# Patient Record
Sex: Female | Born: 1950
Health system: Southern US, Community
[De-identification: ages and names within clinical notes are randomized; demographics above are authoritative.]

## PROBLEM LIST (undated history)

## (undated) DIAGNOSIS — R1031 Right lower quadrant pain: Secondary | ICD-10-CM

## (undated) DIAGNOSIS — R079 Chest pain, unspecified: Secondary | ICD-10-CM

## (undated) DIAGNOSIS — Z8719 Personal history of other diseases of the digestive system: Secondary | ICD-10-CM

## (undated) DIAGNOSIS — K769 Liver disease, unspecified: Secondary | ICD-10-CM

## (undated) DIAGNOSIS — E669 Obesity, unspecified: Secondary | ICD-10-CM

## (undated) DIAGNOSIS — K589 Irritable bowel syndrome without diarrhea: Secondary | ICD-10-CM

## (undated) DIAGNOSIS — E785 Hyperlipidemia, unspecified: Secondary | ICD-10-CM

## (undated) DIAGNOSIS — Q211 Atrial septal defect: Secondary | ICD-10-CM

## (undated) DIAGNOSIS — R5383 Other fatigue: Secondary | ICD-10-CM

## (undated) DIAGNOSIS — D126 Benign neoplasm of colon, unspecified: Secondary | ICD-10-CM

## (undated) DIAGNOSIS — E559 Vitamin D deficiency, unspecified: Secondary | ICD-10-CM

## (undated) DIAGNOSIS — R0602 Shortness of breath: Secondary | ICD-10-CM

## (undated) DIAGNOSIS — K5790 Diverticulosis of intestine, part unspecified, without perforation or abscess without bleeding: Secondary | ICD-10-CM

## (undated) DIAGNOSIS — M1990 Unspecified osteoarthritis, unspecified site: Secondary | ICD-10-CM

## (undated) DIAGNOSIS — Q2111 Secundum atrial septal defect: Secondary | ICD-10-CM

## (undated) DIAGNOSIS — D492 Neoplasm of unspecified behavior of bone, soft tissue, and skin: Secondary | ICD-10-CM

## (undated) DIAGNOSIS — E079 Disorder of thyroid, unspecified: Secondary | ICD-10-CM

## (undated) DIAGNOSIS — J019 Acute sinusitis, unspecified: Secondary | ICD-10-CM

## (undated) DIAGNOSIS — R5381 Other malaise: Secondary | ICD-10-CM

## (undated) DIAGNOSIS — F329 Major depressive disorder, single episode, unspecified: Secondary | ICD-10-CM

## (undated) DIAGNOSIS — I2699 Other pulmonary embolism without acute cor pulmonale: Secondary | ICD-10-CM

## (undated) DIAGNOSIS — F419 Anxiety disorder, unspecified: Secondary | ICD-10-CM

## (undated) DIAGNOSIS — N281 Cyst of kidney, acquired: Secondary | ICD-10-CM

## (undated) DIAGNOSIS — R002 Palpitations: Secondary | ICD-10-CM

## (undated) DIAGNOSIS — R42 Dizziness and giddiness: Secondary | ICD-10-CM

## (undated) DIAGNOSIS — H6990 Unspecified Eustachian tube disorder, unspecified ear: Secondary | ICD-10-CM

## (undated) DIAGNOSIS — I10 Essential (primary) hypertension: Secondary | ICD-10-CM

## (undated) DIAGNOSIS — I82409 Acute embolism and thrombosis of unspecified deep veins of unspecified lower extremity: Secondary | ICD-10-CM

## (undated) DIAGNOSIS — H6980 Other specified disorders of Eustachian tube, unspecified ear: Secondary | ICD-10-CM

## (undated) DIAGNOSIS — K5190 Ulcerative colitis, unspecified, without complications: Secondary | ICD-10-CM

## (undated) DIAGNOSIS — J329 Chronic sinusitis, unspecified: Secondary | ICD-10-CM

## (undated) DIAGNOSIS — H269 Unspecified cataract: Secondary | ICD-10-CM

## (undated) DIAGNOSIS — T7840XA Allergy, unspecified, initial encounter: Secondary | ICD-10-CM

## (undated) HISTORY — DX: Vitamin D deficiency, unspecified: E55.9

## (undated) HISTORY — DX: Obesity, unspecified: E66.9

## (undated) HISTORY — DX: Unspecified osteoarthritis, unspecified site: M19.90

## (undated) HISTORY — DX: Acute sinusitis, unspecified: J01.90

## (undated) HISTORY — DX: Other pulmonary embolism without acute cor pulmonale: I26.99

## (undated) HISTORY — DX: Right lower quadrant pain: R10.31

## (undated) HISTORY — PX: CHOLECYSTECTOMY: SHX55

## (undated) HISTORY — PX: UPPER GASTROINTESTINAL ENDOSCOPY: SHX188

## (undated) HISTORY — DX: Cyst of kidney, acquired: N28.1

## (undated) HISTORY — DX: Essential (primary) hypertension: I10

## (undated) HISTORY — DX: Major depressive disorder, single episode, unspecified: F32.9

## (undated) HISTORY — DX: Unspecified cataract: H26.9

## (undated) HISTORY — DX: Unspecified eustachian tube disorder, unspecified ear: H69.90

## (undated) HISTORY — DX: Other fatigue: R53.83

## (undated) HISTORY — DX: Neoplasm of unspecified behavior of bone, soft tissue, and skin: D49.2

## (undated) HISTORY — DX: Ulcerative colitis, unspecified, without complications: K51.90

## (undated) HISTORY — DX: Chest pain, unspecified: R07.9

## (undated) HISTORY — DX: Allergy, unspecified, initial encounter: T78.40XA

## (undated) HISTORY — DX: Palpitations: R00.2

## (undated) HISTORY — DX: Disorder of thyroid, unspecified: E07.9

## (undated) HISTORY — DX: Shortness of breath: R06.02

## (undated) HISTORY — PX: CERVICAL DISC SURGERY: SHX588

## (undated) HISTORY — DX: Hyperlipidemia, unspecified: E78.5

## (undated) HISTORY — DX: Personal history of other diseases of the digestive system: Z87.19

## (undated) HISTORY — DX: Chronic sinusitis, unspecified: J32.9

## (undated) HISTORY — DX: Atrial septal defect: Q21.1

## (undated) HISTORY — PX: HEMORRHOID SURGERY: SHX153

## (undated) HISTORY — DX: Benign neoplasm of colon, unspecified: D12.6

## (undated) HISTORY — DX: Secundum atrial septal defect: Q21.11

## (undated) HISTORY — PX: KNEE ARTHROTOMY: SUR107

## (undated) HISTORY — DX: Diverticulosis of intestine, part unspecified, without perforation or abscess without bleeding: K57.90

## (undated) HISTORY — PX: PARTIAL HYSTERECTOMY: SHX80

## (undated) HISTORY — PX: REDUCTION MAMMAPLASTY: SUR839

## (undated) HISTORY — DX: Acute embolism and thrombosis of unspecified deep veins of unspecified lower extremity: I82.409

## (undated) HISTORY — DX: Irritable bowel syndrome, unspecified: K58.9

## (undated) HISTORY — DX: Anxiety disorder, unspecified: F41.9

## (undated) HISTORY — PX: ABDOMINAL HYSTERECTOMY: SHX81

## (undated) HISTORY — PX: DILATION AND CURETTAGE OF UTERUS: SHX78

## (undated) HISTORY — PX: COLONOSCOPY: SHX174

## (undated) HISTORY — DX: Dizziness and giddiness: R42

## (undated) HISTORY — DX: Other specified disorders of Eustachian tube, unspecified ear: H69.80

## (undated) HISTORY — DX: Liver disease, unspecified: K76.9

## (undated) HISTORY — DX: Other malaise: R53.81

---

## 1970-10-30 HISTORY — PX: TONSILLECTOMY: SUR1361

## 1974-10-30 HISTORY — PX: LEFT OOPHORECTOMY: SHX1961

## 1988-10-30 HISTORY — PX: BREAST REDUCTION SURGERY: SHX8

## 1999-11-24 ENCOUNTER — Encounter: Payer: Self-pay | Admitting: Family Medicine

## 1999-11-24 ENCOUNTER — Encounter: Admission: RE | Admit: 1999-11-24 | Discharge: 1999-11-24 | Payer: Self-pay | Admitting: Family Medicine

## 2000-09-18 ENCOUNTER — Other Ambulatory Visit: Admission: RE | Admit: 2000-09-18 | Discharge: 2000-09-18 | Payer: Self-pay | Admitting: Family Medicine

## 2000-10-09 ENCOUNTER — Encounter: Admission: RE | Admit: 2000-10-09 | Discharge: 2000-10-09 | Payer: Self-pay | Admitting: Family Medicine

## 2000-10-09 ENCOUNTER — Encounter: Payer: Self-pay | Admitting: Family Medicine

## 2001-03-30 DIAGNOSIS — E785 Hyperlipidemia, unspecified: Secondary | ICD-10-CM

## 2001-04-18 ENCOUNTER — Encounter: Admission: RE | Admit: 2001-04-18 | Discharge: 2001-04-18 | Payer: Self-pay | Admitting: Family Medicine

## 2001-04-18 ENCOUNTER — Encounter: Payer: Self-pay | Admitting: Family Medicine

## 2003-12-14 ENCOUNTER — Encounter: Admission: RE | Admit: 2003-12-14 | Discharge: 2003-12-14 | Payer: Self-pay | Admitting: Family Medicine

## 2004-01-21 ENCOUNTER — Encounter: Admission: RE | Admit: 2004-01-21 | Discharge: 2004-01-21 | Payer: Self-pay | Admitting: Family Medicine

## 2004-09-01 ENCOUNTER — Ambulatory Visit: Payer: Self-pay | Admitting: Family Medicine

## 2004-09-29 ENCOUNTER — Encounter (INDEPENDENT_AMBULATORY_CARE_PROVIDER_SITE_OTHER): Payer: Self-pay | Admitting: Internal Medicine

## 2004-10-03 ENCOUNTER — Ambulatory Visit: Payer: Self-pay | Admitting: Internal Medicine

## 2004-10-19 ENCOUNTER — Ambulatory Visit: Payer: Self-pay | Admitting: Family Medicine

## 2004-10-27 ENCOUNTER — Ambulatory Visit: Payer: Self-pay | Admitting: Family Medicine

## 2004-10-27 ENCOUNTER — Other Ambulatory Visit: Admission: RE | Admit: 2004-10-27 | Discharge: 2004-11-04 | Payer: Self-pay | Admitting: Internal Medicine

## 2004-10-30 LAB — FECAL OCCULT BLOOD, GUAIAC: Fecal Occult Blood: NEGATIVE

## 2004-11-07 ENCOUNTER — Ambulatory Visit: Payer: Self-pay | Admitting: Family Medicine

## 2004-11-30 ENCOUNTER — Ambulatory Visit: Payer: Self-pay | Admitting: Family Medicine

## 2005-01-25 ENCOUNTER — Encounter: Admission: RE | Admit: 2005-01-25 | Discharge: 2005-01-25 | Payer: Self-pay | Admitting: Neurology

## 2005-01-28 DIAGNOSIS — Q211 Atrial septal defect: Secondary | ICD-10-CM

## 2005-02-03 ENCOUNTER — Ambulatory Visit: Payer: Self-pay

## 2005-02-15 ENCOUNTER — Encounter: Payer: Self-pay | Admitting: Cardiology

## 2005-02-15 ENCOUNTER — Ambulatory Visit (HOSPITAL_COMMUNITY): Admission: RE | Admit: 2005-02-15 | Discharge: 2005-02-15 | Payer: Self-pay | Admitting: Cardiology

## 2005-02-15 ENCOUNTER — Ambulatory Visit: Payer: Self-pay | Admitting: Cardiology

## 2005-02-20 ENCOUNTER — Ambulatory Visit: Payer: Self-pay

## 2005-03-29 ENCOUNTER — Ambulatory Visit: Payer: Self-pay | Admitting: Internal Medicine

## 2005-07-27 ENCOUNTER — Emergency Department (HOSPITAL_COMMUNITY): Admission: EM | Admit: 2005-07-27 | Discharge: 2005-07-27 | Payer: Self-pay | Admitting: Emergency Medicine

## 2005-08-03 ENCOUNTER — Ambulatory Visit: Payer: Self-pay | Admitting: Family Medicine

## 2005-10-18 ENCOUNTER — Ambulatory Visit: Payer: Self-pay | Admitting: Family Medicine

## 2005-12-22 ENCOUNTER — Ambulatory Visit: Payer: Self-pay | Admitting: Family Medicine

## 2006-02-20 ENCOUNTER — Emergency Department (HOSPITAL_COMMUNITY): Admission: EM | Admit: 2006-02-20 | Discharge: 2006-02-20 | Payer: Self-pay | Admitting: Emergency Medicine

## 2006-03-30 ENCOUNTER — Ambulatory Visit: Payer: Self-pay | Admitting: Internal Medicine

## 2006-05-25 ENCOUNTER — Ambulatory Visit: Payer: Self-pay | Admitting: Family Medicine

## 2006-07-10 ENCOUNTER — Ambulatory Visit: Payer: Self-pay | Admitting: Family Medicine

## 2006-08-14 ENCOUNTER — Ambulatory Visit: Payer: Self-pay | Admitting: Family Medicine

## 2006-12-26 ENCOUNTER — Ambulatory Visit: Payer: Self-pay | Admitting: Family Medicine

## 2007-01-04 ENCOUNTER — Ambulatory Visit: Payer: Self-pay | Admitting: Family Medicine

## 2007-01-16 ENCOUNTER — Ambulatory Visit: Payer: Self-pay | Admitting: Family Medicine

## 2007-01-16 LAB — CONVERTED CEMR LAB
Basophils Relative: 0.5 % (ref 0.0–1.0)
Eosinophils Relative: 2 % (ref 0.0–5.0)
HCT: 41.4 % (ref 36.0–46.0)
Hemoglobin: 14.4 g/dL (ref 12.0–15.0)
Lymphocytes Relative: 35 % (ref 12.0–46.0)
Monocytes Absolute: 0.5 10*3/uL (ref 0.2–0.7)
Neutro Abs: 4.1 10*3/uL (ref 1.4–7.7)
RDW: 12 % (ref 11.5–14.6)
WBC: 7.2 10*3/uL (ref 4.5–10.5)

## 2007-04-17 ENCOUNTER — Encounter: Admission: RE | Admit: 2007-04-17 | Discharge: 2007-04-17 | Payer: Self-pay | Admitting: Family Medicine

## 2007-07-12 ENCOUNTER — Encounter: Admission: RE | Admit: 2007-07-12 | Discharge: 2007-07-12 | Payer: Self-pay | Admitting: Internal Medicine

## 2007-09-01 ENCOUNTER — Emergency Department (HOSPITAL_COMMUNITY): Admission: EM | Admit: 2007-09-01 | Discharge: 2007-09-01 | Payer: Self-pay | Admitting: Family Medicine

## 2007-12-01 DIAGNOSIS — C44309 Unspecified malignant neoplasm of skin of other parts of face: Secondary | ICD-10-CM | POA: Insufficient documentation

## 2007-12-01 DIAGNOSIS — C44300 Unspecified malignant neoplasm of skin of unspecified part of face: Secondary | ICD-10-CM | POA: Insufficient documentation

## 2007-12-09 ENCOUNTER — Ambulatory Visit: Payer: Self-pay | Admitting: Family Medicine

## 2008-03-02 ENCOUNTER — Ambulatory Visit: Payer: Self-pay | Admitting: Family Medicine

## 2008-04-13 ENCOUNTER — Ambulatory Visit: Payer: Self-pay | Admitting: Family Medicine

## 2008-04-13 DIAGNOSIS — H6980 Other specified disorders of Eustachian tube, unspecified ear: Secondary | ICD-10-CM

## 2008-07-22 ENCOUNTER — Ambulatory Visit: Payer: Self-pay | Admitting: Internal Medicine

## 2008-07-22 ENCOUNTER — Observation Stay (HOSPITAL_COMMUNITY): Admission: EM | Admit: 2008-07-22 | Discharge: 2008-07-23 | Payer: Self-pay | Admitting: Emergency Medicine

## 2008-07-22 ENCOUNTER — Telehealth (INDEPENDENT_AMBULATORY_CARE_PROVIDER_SITE_OTHER): Payer: Self-pay | Admitting: Internal Medicine

## 2008-07-22 ENCOUNTER — Encounter (INDEPENDENT_AMBULATORY_CARE_PROVIDER_SITE_OTHER): Payer: Self-pay | Admitting: Internal Medicine

## 2008-08-05 ENCOUNTER — Encounter (INDEPENDENT_AMBULATORY_CARE_PROVIDER_SITE_OTHER): Payer: Self-pay | Admitting: Internal Medicine

## 2008-08-05 ENCOUNTER — Ambulatory Visit: Payer: Self-pay

## 2008-08-11 ENCOUNTER — Ambulatory Visit: Payer: Self-pay | Admitting: Family Medicine

## 2008-08-11 DIAGNOSIS — R42 Dizziness and giddiness: Secondary | ICD-10-CM

## 2008-08-11 DIAGNOSIS — E66811 Obesity, class 1: Secondary | ICD-10-CM | POA: Insufficient documentation

## 2008-08-11 DIAGNOSIS — E669 Obesity, unspecified: Secondary | ICD-10-CM

## 2008-08-13 ENCOUNTER — Encounter (INDEPENDENT_AMBULATORY_CARE_PROVIDER_SITE_OTHER): Payer: Self-pay | Admitting: Internal Medicine

## 2008-08-13 DIAGNOSIS — R002 Palpitations: Secondary | ICD-10-CM

## 2008-08-13 DIAGNOSIS — J329 Chronic sinusitis, unspecified: Secondary | ICD-10-CM | POA: Insufficient documentation

## 2008-08-25 ENCOUNTER — Other Ambulatory Visit: Admission: RE | Admit: 2008-08-25 | Discharge: 2008-08-25 | Payer: Self-pay | Admitting: Family Medicine

## 2008-08-25 ENCOUNTER — Encounter (INDEPENDENT_AMBULATORY_CARE_PROVIDER_SITE_OTHER): Payer: Self-pay | Admitting: Internal Medicine

## 2008-08-25 ENCOUNTER — Ambulatory Visit: Payer: Self-pay | Admitting: Family Medicine

## 2008-08-27 ENCOUNTER — Encounter (INDEPENDENT_AMBULATORY_CARE_PROVIDER_SITE_OTHER): Payer: Self-pay | Admitting: Internal Medicine

## 2008-09-02 ENCOUNTER — Encounter: Admission: RE | Admit: 2008-09-02 | Discharge: 2008-09-02 | Payer: Self-pay | Admitting: Family Medicine

## 2008-09-03 ENCOUNTER — Encounter (INDEPENDENT_AMBULATORY_CARE_PROVIDER_SITE_OTHER): Payer: Self-pay | Admitting: *Deleted

## 2008-09-03 ENCOUNTER — Ambulatory Visit: Payer: Self-pay | Admitting: Family Medicine

## 2008-09-10 ENCOUNTER — Encounter (INDEPENDENT_AMBULATORY_CARE_PROVIDER_SITE_OTHER): Payer: Self-pay | Admitting: *Deleted

## 2008-09-18 ENCOUNTER — Ambulatory Visit: Payer: Self-pay | Admitting: Family Medicine

## 2008-09-28 ENCOUNTER — Encounter (INDEPENDENT_AMBULATORY_CARE_PROVIDER_SITE_OTHER): Payer: Self-pay | Admitting: *Deleted

## 2008-09-28 LAB — CONVERTED CEMR LAB
OCCULT 2: NEGATIVE
OCCULT 3: NEGATIVE

## 2009-01-21 ENCOUNTER — Ambulatory Visit: Payer: Self-pay | Admitting: Family Medicine

## 2009-02-01 ENCOUNTER — Telehealth (INDEPENDENT_AMBULATORY_CARE_PROVIDER_SITE_OTHER): Payer: Self-pay | Admitting: Internal Medicine

## 2009-05-11 ENCOUNTER — Ambulatory Visit: Payer: Self-pay | Admitting: Family Medicine

## 2009-05-17 ENCOUNTER — Telehealth (INDEPENDENT_AMBULATORY_CARE_PROVIDER_SITE_OTHER): Payer: Self-pay | Admitting: Internal Medicine

## 2009-06-30 ENCOUNTER — Encounter (INDEPENDENT_AMBULATORY_CARE_PROVIDER_SITE_OTHER): Payer: Self-pay | Admitting: Internal Medicine

## 2009-09-17 ENCOUNTER — Telehealth (INDEPENDENT_AMBULATORY_CARE_PROVIDER_SITE_OTHER): Payer: Self-pay | Admitting: Internal Medicine

## 2009-09-20 ENCOUNTER — Ambulatory Visit: Payer: Self-pay | Admitting: Internal Medicine

## 2009-09-20 DIAGNOSIS — J0140 Acute pansinusitis, unspecified: Secondary | ICD-10-CM | POA: Insufficient documentation

## 2009-09-20 DIAGNOSIS — J019 Acute sinusitis, unspecified: Secondary | ICD-10-CM | POA: Insufficient documentation

## 2009-10-02 ENCOUNTER — Ambulatory Visit: Payer: Self-pay | Admitting: Family Medicine

## 2009-10-30 DIAGNOSIS — K769 Liver disease, unspecified: Secondary | ICD-10-CM

## 2009-10-30 HISTORY — DX: Liver disease, unspecified: K76.9

## 2009-12-20 ENCOUNTER — Ambulatory Visit: Payer: Self-pay | Admitting: Family Medicine

## 2009-12-20 DIAGNOSIS — R5381 Other malaise: Secondary | ICD-10-CM | POA: Insufficient documentation

## 2009-12-20 DIAGNOSIS — R1031 Right lower quadrant pain: Secondary | ICD-10-CM | POA: Insufficient documentation

## 2009-12-20 DIAGNOSIS — R5383 Other fatigue: Secondary | ICD-10-CM

## 2009-12-20 LAB — CONVERTED CEMR LAB
Albumin: 3.9 g/dL (ref 3.5–5.2)
Alkaline Phosphatase: 80 units/L (ref 39–117)
Basophils Relative: 0 % (ref 0.0–3.0)
Chloride: 109 meq/L (ref 96–112)
Creatinine, Ser: 0.8 mg/dL (ref 0.4–1.2)
Eosinophils Relative: 3 % (ref 0.0–5.0)
HCT: 41.9 % (ref 36.0–46.0)
Hemoglobin: 14 g/dL (ref 12.0–15.0)
Lymphs Abs: 2.2 10*3/uL (ref 0.7–4.0)
MCV: 94.1 fL (ref 78.0–100.0)
Monocytes Absolute: 0.5 10*3/uL (ref 0.1–1.0)
Neutro Abs: 3.9 10*3/uL (ref 1.4–7.7)
Platelets: 391 10*3/uL (ref 150.0–400.0)
Potassium: 3.6 meq/L (ref 3.5–5.1)
Sodium: 144 meq/L (ref 135–145)
TSH: 3.27 microintl units/mL (ref 0.35–5.50)
Total Protein: 6.7 g/dL (ref 6.0–8.3)
WBC: 6.8 10*3/uL (ref 4.5–10.5)

## 2009-12-22 ENCOUNTER — Encounter: Admission: RE | Admit: 2009-12-22 | Discharge: 2009-12-22 | Payer: Self-pay | Admitting: Family Medicine

## 2009-12-22 ENCOUNTER — Encounter: Payer: Self-pay | Admitting: Family Medicine

## 2009-12-22 DIAGNOSIS — N281 Cyst of kidney, acquired: Secondary | ICD-10-CM | POA: Insufficient documentation

## 2009-12-22 LAB — CONVERTED CEMR LAB: Vit D, 25-Hydroxy: 14 ng/mL — ABNORMAL LOW (ref 30–89)

## 2009-12-24 ENCOUNTER — Encounter: Payer: Self-pay | Admitting: Family Medicine

## 2009-12-24 ENCOUNTER — Telehealth: Payer: Self-pay | Admitting: Family Medicine

## 2009-12-24 ENCOUNTER — Ambulatory Visit: Payer: Self-pay | Admitting: Cardiology

## 2009-12-29 ENCOUNTER — Encounter (INDEPENDENT_AMBULATORY_CARE_PROVIDER_SITE_OTHER): Payer: Self-pay | Admitting: *Deleted

## 2009-12-30 ENCOUNTER — Telehealth: Payer: Self-pay | Admitting: Family Medicine

## 2009-12-30 ENCOUNTER — Ambulatory Visit: Payer: Self-pay | Admitting: Internal Medicine

## 2010-01-05 ENCOUNTER — Encounter (INDEPENDENT_AMBULATORY_CARE_PROVIDER_SITE_OTHER): Payer: Self-pay | Admitting: *Deleted

## 2010-01-13 ENCOUNTER — Ambulatory Visit: Payer: Self-pay | Admitting: Internal Medicine

## 2010-01-15 ENCOUNTER — Encounter: Payer: Self-pay | Admitting: Internal Medicine

## 2010-03-09 ENCOUNTER — Ambulatory Visit: Payer: Self-pay | Admitting: Family Medicine

## 2010-03-09 DIAGNOSIS — F3289 Other specified depressive episodes: Secondary | ICD-10-CM | POA: Insufficient documentation

## 2010-03-09 DIAGNOSIS — F329 Major depressive disorder, single episode, unspecified: Secondary | ICD-10-CM | POA: Insufficient documentation

## 2010-03-15 ENCOUNTER — Ambulatory Visit: Payer: Self-pay | Admitting: Family Medicine

## 2010-03-24 ENCOUNTER — Ambulatory Visit: Payer: Self-pay | Admitting: Family Medicine

## 2010-04-06 ENCOUNTER — Encounter: Payer: Self-pay | Admitting: Family Medicine

## 2010-04-06 ENCOUNTER — Telehealth: Payer: Self-pay | Admitting: Family Medicine

## 2010-04-08 ENCOUNTER — Ambulatory Visit: Payer: Self-pay | Admitting: Family Medicine

## 2010-04-08 DIAGNOSIS — E559 Vitamin D deficiency, unspecified: Secondary | ICD-10-CM | POA: Insufficient documentation

## 2010-04-11 LAB — CONVERTED CEMR LAB: Vit D, 25-Hydroxy: 23 ng/mL — ABNORMAL LOW (ref 30–89)

## 2010-06-02 ENCOUNTER — Encounter (INDEPENDENT_AMBULATORY_CARE_PROVIDER_SITE_OTHER): Payer: Self-pay | Admitting: *Deleted

## 2010-09-05 ENCOUNTER — Ambulatory Visit: Payer: Self-pay | Admitting: Family Medicine

## 2010-09-12 ENCOUNTER — Telehealth: Payer: Self-pay | Admitting: Family Medicine

## 2010-11-09 ENCOUNTER — Encounter: Payer: Self-pay | Admitting: Family Medicine

## 2010-11-09 ENCOUNTER — Ambulatory Visit
Admission: RE | Admit: 2010-11-09 | Discharge: 2010-11-09 | Payer: Self-pay | Source: Home / Self Care | Attending: Family Medicine | Admitting: Family Medicine

## 2010-11-09 DIAGNOSIS — R079 Chest pain, unspecified: Secondary | ICD-10-CM | POA: Insufficient documentation

## 2010-11-23 ENCOUNTER — Ambulatory Visit
Admission: RE | Admit: 2010-11-23 | Discharge: 2010-11-23 | Payer: Self-pay | Source: Home / Self Care | Attending: Cardiology | Admitting: Cardiology

## 2010-11-23 DIAGNOSIS — R0602 Shortness of breath: Secondary | ICD-10-CM | POA: Insufficient documentation

## 2010-11-24 ENCOUNTER — Ambulatory Visit
Admission: RE | Admit: 2010-11-24 | Discharge: 2010-11-24 | Payer: Self-pay | Source: Home / Self Care | Attending: Family Medicine | Admitting: Family Medicine

## 2010-11-24 DIAGNOSIS — G43909 Migraine, unspecified, not intractable, without status migrainosus: Secondary | ICD-10-CM | POA: Insufficient documentation

## 2010-11-28 ENCOUNTER — Ambulatory Visit
Admission: RE | Admit: 2010-11-28 | Discharge: 2010-11-28 | Payer: Self-pay | Source: Home / Self Care | Attending: Cardiology | Admitting: Cardiology

## 2010-11-28 ENCOUNTER — Ambulatory Visit (HOSPITAL_COMMUNITY)
Admission: RE | Admit: 2010-11-28 | Discharge: 2010-11-28 | Payer: Self-pay | Source: Home / Self Care | Attending: Cardiology | Admitting: Cardiology

## 2010-11-28 ENCOUNTER — Ambulatory Visit: Admission: RE | Admit: 2010-11-28 | Discharge: 2010-11-28 | Payer: Self-pay | Source: Home / Self Care

## 2010-11-28 ENCOUNTER — Other Ambulatory Visit: Payer: Self-pay | Admitting: Cardiology

## 2010-11-28 LAB — BASIC METABOLIC PANEL
BUN: 12 mg/dL (ref 6–23)
CO2: 26 mEq/L (ref 19–32)
Chloride: 105 mEq/L (ref 96–112)
Creatinine, Ser: 0.8 mg/dL (ref 0.4–1.2)
Glucose, Bld: 90 mg/dL (ref 70–99)

## 2010-11-28 LAB — HEPATIC FUNCTION PANEL
Alkaline Phosphatase: 78 U/L (ref 39–117)
Bilirubin, Direct: 0.1 mg/dL (ref 0.0–0.3)
Total Bilirubin: 0.7 mg/dL (ref 0.3–1.2)
Total Protein: 6.7 g/dL (ref 6.0–8.3)

## 2010-11-28 LAB — CBC WITH DIFFERENTIAL/PLATELET
Basophils Relative: 0.7 % (ref 0.0–3.0)
Eosinophils Absolute: 0.2 10*3/uL (ref 0.0–0.7)
Eosinophils Relative: 3 % (ref 0.0–5.0)
Hemoglobin: 14.3 g/dL (ref 12.0–15.0)
Lymphocytes Relative: 42.8 % (ref 12.0–46.0)
MCHC: 34.3 g/dL (ref 30.0–36.0)
Monocytes Relative: 5.8 % (ref 3.0–12.0)
Neutro Abs: 3.1 10*3/uL (ref 1.4–7.7)
RBC: 4.48 Mil/uL (ref 3.87–5.11)
WBC: 6.5 10*3/uL (ref 4.5–10.5)

## 2010-11-28 LAB — LDL CHOLESTEROL, DIRECT: Direct LDL: 181.8 mg/dL

## 2010-11-28 LAB — LIPID PANEL
Cholesterol: 254 mg/dL — ABNORMAL HIGH (ref 0–200)
Triglycerides: 236 mg/dL — ABNORMAL HIGH (ref 0.0–149.0)

## 2010-11-28 LAB — TSH: TSH: 2.78 u[IU]/mL (ref 0.35–5.50)

## 2010-11-29 NOTE — Assessment & Plan Note (Signed)
Summary: sinus infection/alc   Vital Signs:  Patient profile:   60 year old female Height:      63.50 inches Weight:      187 pounds BMI:     32.72 Temp:     98.2 degrees F oral Pulse rate:   68 / minute Pulse rhythm:   regular BP sitting:   150 / 80  (left arm) Cuff size:   large  Vitals Entered By: Linde Gillis CMA Duncan Dull) (September 05, 2010 9:44 AM) CC: ? sinus infection   History of Present Illness: 60 yo here for ? sinus infection.  Almost two weeks of worsening URI symptoms. Started with sneezing, runny nose, now with facial pressure. Teeth hurting, right ear was hurting last night. Subjective fevers, chills. No SOB. No CP. Cough is dry.  Taking Ibuprofen.  Current Medications (verified): 1)  Adult Aspirin Ec Low Strength 81 Mg  Tbec (Aspirin) .... Take 1 Tablet By Mouth Once A Day 2)  Advil 200 Mg  Caps (Ibuprofen) .... As Needed 3)  Topamax 50 Mg Tabs (Topiramate) .... Take One Tablet By Mouth Twice Daily 4)  Augmentin 875-125 Mg Tabs (Amoxicillin-Pot Clavulanate) .Marland Kitchen.. 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 ENT:  Complains of earache, nasal congestion, sinus pressure, and sore throat; denies ear discharge. CV:  Denies chest pain or discomfort. Resp:  Complains of cough; denies shortness of breath, sputum productive, and wheezing.  Physical Exam  General:  Well-developed,well-nourished,in no acute distress; alert,appropriate and cooperative throughout examination Afebrile,  nontoxic appearing.  Eyes:  No corneal or conjunctival inflammation noted. EOMI. Perrla. Funduscopic exam benign, without hemorrhages, exudates or papilledema. Vision grossly normal. Ears:  yellow fluid B TMS, no erythema Nose:  nasal discharge, mucosal pallor.   Frontal sinuses TTP Mouth:  MMM Lungs:  Normal respiratory effort, chest expands symmetrically. Lungs are clear to auscultation, no crackles or wheezes. Heart:  Normal rate and regular rhythm. S1 and S2 normal without gallop, murmur, click, rub or other extra sounds. Psych:  normally interactive and good eye contact.     Impression & Recommendations:  Problem # 1:  SINUSITIS - ACUTE-NOS (ICD-461.9) Assessment New Given duration and progression of symptoms, will treat for bacterial sinusitis with Augmentin. See pt instructions for details. Her updated medication list for this problem includes:    Augmentin 875-125 Mg Tabs (Amoxicillin-pot clavulanate) .Marland Kitchen... 1 by mouth 2 times daily x 10 days  Complete Medication List: 1)  Adult Aspirin Ec Low Strength 81 Mg Tbec (Aspirin) .... Take 1 tablet by mouth once a day 2)  Advil 200 Mg Caps (Ibuprofen) .... As needed 3)  Topamax 50 Mg Tabs (Topiramate) .... Take one tablet by mouth twice daily 4)  Augmentin 875-125  Mg Tabs (Amoxicillin-pot clavulanate) .Marland Kitchen.. 1 by mouth 2 times daily x 10 days  Patient Instructions: 1)  Take Augmentin as directed.  Drink lots of fluids.  Treat sympotmatically with Mucinex, nasal saline irrigation, and Tylenol/Ibuprofen. Also try claritin D or zyrtec D over the counter- two  times a day as needed ( have to sign for them at pharmacy). You can use warm compresses.  Cough suppressant at night. Call if not improving as expected in 5-7 days.  Prescriptions: AUGMENTIN 875-125 MG TABS (AMOXICILLIN-POT CLAVULANATE) 1 by mouth 2 times daily x 10 days  #20 x 0   Entered and Authorized by:   Ruthe Mannan MD   Signed by:   Ruthe Mannan MD on 09/05/2010   Method used:   [facility] [id number]       [address]       [place], [place]  [address]       Ph: [phone] or [phone]       Fax: [phone]   RxID:   [id number]    Orders Added: 1)  Est. Patient Level III [37628]    Current Allergies (reviewed today): No known allergies

## 2010-11-29 NOTE — Miscellaneous (Signed)
Summary: Orders Update  Clinical Lists Changes  Problems: Added new problem of RENAL CYST (ICD-593.2) Orders: Added new Referral order of Radiology Referral (Radiology) - Signed 

## 2010-11-29 NOTE — Letter (Signed)
Summary: [facility]  [facility]   Imported By: Lanelle Bal 02/24/2010 12:57:03  _____________________________________________________________________  External Attachment:    Type:   Image     Comment:   External Document

## 2010-11-29 NOTE — Letter (Signed)
Summary: Patient Notice- Polyp Results  [facility]  [address] [place], [place] [address]   Phone: [phone]  Fax: [phone]        January 15, 2010 MRN: [id number]    Nicole Daniels [address] [place], [place]  [address]    Dear Ms. Wixon,  I am pleased to inform you that the colon polyp(s) removed during your recent colonoscopy was (were) found to be benign (no cancer detected) upon pathologic examination.The polyp was adenomatous ( precancerous)  I recommend you have a repeat colonoscopy examination in _5 years to look for recurrent polyps, as having colon polyps increases your risk for having recurrent polyps or even colon cancer in the future.  Should you develop new or worsening symptoms of abdominal pain, bowel habit changes or bleeding from the rectum or bowels, please schedule an evaluation with either your primary care physician or with me.  Additional information/recommendations:  _x_ No further action with gastroenterology is needed at this time. Please      follow-up with your primary care physician for your other healthcare      needs.  __ Please call [phone] to schedule a return visit to review your      situation.  __ Please keep your follow-up visit as already scheduled.  __ Continue treatment plan as outlined the day of your exam.  Please call us if you are having persistent problems or have questions about your condition that have not been fully answered at this time.  Sincerely,  Hart Carwin MD  This letter has been electronically signed by your physician.  Appended Document: Patient Notice- Polyp Results Letter mailed 3.22.11

## 2010-11-29 NOTE — Procedures (Signed)
Summary: Colonoscopy  Patient: Makylah Bossard Note: All result statuses are Final unless otherwise noted.  Tests: (1) Colonoscopy (COL)   COL Colonoscopy           [facility]     [address]     [place], [place]  [address]           COLONOSCOPY PROCEDURE REPORT           PATIENT:  Sonyia, Muro  MR#:  [id number]     BIRTHDATE:  1951-02-06, 58 yrs. old  GENDER:  female           ENDOSCOPIST:  Hedwig Morton. Juanda Chance, MD     Referred by:  Ruthe Mannan, M.D.           PROCEDURE DATE:  01/13/2010     PROCEDURE:  Colonoscopy 57[address]     ASA CLASS:  Class I     INDICATIONS:  Routine Risk Screening           MEDICATIONS:   Versed 8 mg, Fentanyl 75 mcg           DESCRIPTION OF PROCEDURE:   After the risks benefits and     alternatives of the procedure were thoroughly explained, informed     consent was obtained.  Digital rectal exam was performed and     revealed no rectal masses.   The LB [id number] endoscope     was introduced through the anus and advanced to the cecum, which     was identified by both the appendix and ileocecal valve, without     limitations.  The quality of the prep was good, using MiraLax.     The instrument was then slowly withdrawn as the colon was fully     examined.     <<PROCEDUREIMAGES>>           FINDINGS:  A sessile polyp was found in the rectum. 8 mm polyp at     5 cm removed Polyp was snared without cautery. Retrieval was     successful (see image6). snare polyp  Mild diverticulosis was     found (see image5 and image4).  This was otherwise a normal     examination of the colon (see image1, image2, image3, and image7).     Retroflexed views in the rectum revealed no abnormalities.    The     scope was then withdrawn from the patient and the procedure     completed.           COMPLICATIONS:  None           ENDOSCOPIC IMPRESSION:     1) Sessile polyp in the rectum     2) Mild diverticulosis     3) Otherwise normal  examination     RECOMMENDATIONS:     1) Await pathology results           REPEAT EXAM:  In 5 - 7 year(s) for.  5 year recall if polyp     adenomatous           ______________________________     Hedwig Morton. Juanda Chance, MD           CC:           n.     eSIGNED:   Hedwig Morton. Josey Forcier at 01/13/2010 11:45 AM           Anise Salvo, [phone]  Note: An exclamation mark Marland Kitchen)  indicates a result that was not dispersed into the flowsheet. Document Creation Date: 01/13/2010 11:46 AM _______________________________________________________________________  (1) Order result status: Final Collection or observation date-time: 01/13/2010 11:40 Requested date-time:  Receipt date-time:  Reported date-time:  Referring Physician:   Ordering Physician: Lina Sar [id number]) Specimen Source:  Source: Launa Grill Order Number: [id number] Lab site:   Appended Document: Colonoscopy     Procedures Next Due Date:    Colonoscopy: 01/2015

## 2010-11-29 NOTE — Progress Notes (Signed)
Summary: not any better  Phone Note Call from Patient Call back at Home Phone [phone] Call back at [phone]   Caller: Patient Summary of Call: Pt was seen on 09/05/10 for a sinus infection and she is not any better.  Taking augmentin, says the sinus headaches are over powering- causing nausea and almost brings tears to her eyes.  She will finish the augmentin tomorrow or wednesday and is asking if something else can be called [facility]. Initial call taken by: Lowella Petties CMA, AAMA,  September 12, 2010 12:41 PM  Follow-up for Phone Call        ok to send in a zpack. no refills. Ruthe Mannan MD  September 12, 2010 2:36 PM  Advised pt, [facility] Follow-up by: Lowella Petties CMA, AAMA,  September 12, 2010 2:53 PM    New/Updated Medications: ZITHROMAX Z-PAK 250 MG TABS (AZITHROMYCIN) take by mouth as directed Prescriptions: ZITHROMAX Z-PAK 250 MG TABS (AZITHROMYCIN) take by mouth as directed  #1 pack x 0   Entered by:   Lowella Petties CMA, AAMA   Authorized by:   Ruthe Mannan MD   Signed by:   Lowella Petties CMA, AAMA on 09/12/2010   Method used:   [facility]        [facility] [id number]       [address]       [place], [place]  [address]       Ph: [phone] or [phone]       Fax: [phone]   RxID:   [id number]   Prior Medications: ADULT ASPIRIN EC LOW STRENGTH 81 MG  TBEC (ASPIRIN) Take 1 tablet by mouth once a day ADVIL 200 MG  CAPS (IBUPROFEN) as needed TOPAMAX 50 MG TABS (TOPIRAMATE) take one tablet by mouth twice daily AUGMENTIN 875-125 MG TABS (AMOXICILLIN-POT CLAVULANATE) 1 by mouth 2 times daily x 10 days Current Allergies: No known allergies

## 2010-11-29 NOTE — Progress Notes (Signed)
Summary: needs note stating that she can drive  Phone Note Call from Patient Call back at Work Phone [phone]   Caller: Patient Call For: Dr Dayton Martes Complaint: Cough/Sore throat Summary of Call: Pt was seen last month for a sinus infection and was told not to drive at work.  Now that she is better she needs a note releasing her to be able to drive.  Pt will pick up on friday. Initial call taken by: Lowella Petties CMA,  April 06, 2010 12:40 PM  Follow-up for Phone Call        On my desk. Ruthe Mannan MD  April 06, 2010 12:45 PM      Appended Document: needs note stating that she can drive Note giving patient permission to drive at work left at front desk.

## 2010-11-29 NOTE — Letter (Signed)
Summary: Out of Work  [facility]  [address] [place], [place] [address]   Phone: [phone]  Fax: [phone]    Mar 09, 2010   Employee:  ADREANNA FICKEL    To Whom It May Concern:   For Medical reasons, please excuse the above named employee from work for the following dates:  Start:   5/11/201  End:   03/14/2010  If you need additional information, please feel free to contact our office.         Sincerely,    Ruthe Mannan MD

## 2010-11-29 NOTE — Assessment & Plan Note (Signed)
Summary: EARS,HA,STIFF NECK,FATIGUE/CLE   Vital Signs:  Patient profile:   60 year old female Height:      63.5 inches Weight:      191.38 pounds BMI:     33.49 Temp:     98.7 degrees F oral Pulse rate:   80 / minute Pulse rhythm:   regular BP sitting:   132 / 78  (left arm) Cuff size:   regular  Vitals Entered By: Delilah Shan CMA Duncan Dull) (Mar 09, 2010 2:47 PM) CC: Ears, HA, stiff neck   History of Present Illness: 60 yo here with multiple compaints.  1.  Nasal congestion, sinus pressure, feeling feverish x 1 week. Dry cough.  No SOB or wheezing. Taking mucinex OTC, no relief of symptoms.  2.  Still fatigued, stopped taking her Vitamin D because she felt it is not helping. Does endorse signs of depression when asked- anhedonia, fatigue, insomina, tearfulness.  No SI or HI. No anxiety. Has been on going for about 7 months.  Current Medications (verified): 1)  Adult Aspirin Ec Low Strength 81 Mg  Tbec (Aspirin) .... Take 1 Tablet By Mouth Once A Day 2)  Advil 200 Mg  Caps (Ibuprofen) .... As Needed 3)  Amoxicillin 500 Mg Tabs (Amoxicillin) .Marland Kitchen.. 1 Tab By Mouth Two Times A Day X 10 Days 4)  Wellbutrin Sr 150 Mg Xr12h-Tab (Bupropion Hcl) .Marland Kitchen.. 1 Tab By Mouth Every Morning.  Allergies (verified): No Known Drug Allergies  Review of Systems      See HPI General:  Complains of chills, fatigue, and fever. Resp:  Denies cough, shortness of breath, sputum productive, and wheezing. GI:  Denies abdominal pain, nausea, vomiting, and vomiting blood. Psych:  Complains of depression; denies suicidal thoughts/plans, thoughts of violence, unusual visions or sounds, and thoughts /plans of harming others.  Physical Exam  General:  Well-developed,well-nourished,in no acute distress; alert,appropriate and cooperative throughout examination  Ears:  yellow fluid B TMS, no erythema Nose:  nasal discharge, mucosal pallor.   Frontal sinuses TTP Mouth:  MMM Lungs:  Normal respiratory effort,  chest expands symmetrically. Lungs are clear to auscultation, no crackles or wheezes. Heart:  Normal rate and regular rhythm. S1 and S2 normal without gallop, murmur, click, rub or other extra sounds. Psych:  normally interactive and good eye contact.     Impression & Recommendations:  Problem # 1:  SINUSITIS - ACUTE-NOS (ICD-461.9) Assessment New Given duration and progression of symptoms, will treat for bacterial sinusitis.  See pt instructions for details. Her updated medication list for this problem includes:    Amoxicillin 500 Mg Tabs (Amoxicillin) .Marland Kitchen... 1 tab by mouth two times a day x 10 days  Problem # 2:  DEPRESSION, MILD (ICD-311) Assessment: New Time spent with patient 25 minutes, more than 50% of this time was spent counseling patient on depression.  Start Wellbutrin 150 mg qam.  Advised returning next week to draw labs to rule out other sources of fatigue when she is not acutely ill, although most likely related to depressoin.  Her updated medication list for this problem includes:    Wellbutrin Sr 150 Mg Xr12h-tab (Bupropion hcl) .Marland Kitchen... 1 tab by mouth every morning.  Complete Medication List: 1)  Adult Aspirin Ec Low Strength 81 Mg Tbec (Aspirin) .... Take 1 tablet by mouth once a day 2)  Advil 200 Mg Caps (Ibuprofen) .... As needed 3)  Amoxicillin 500 Mg Tabs (Amoxicillin) .Marland Kitchen.. 1 tab by mouth two times a day x 10 days 4)  Wellbutrin Sr 150 Mg Xr12h-tab (Bupropion hcl) .Marland Kitchen.. 1 tab by mouth every morning.  Patient Instructions: 1)  Take amoxicillin as directed.  Drink lots of fluids.  Treat sympotmatically with Mucinex, nasal saline irrigation, and Tylenol/Ibuprofen. Also try claritin D or zyrtec D over the counter- two times a day as needed ( have to sign for them at pharmacy). You can use warm compresses. Call if not improving as expected in 5-7 days.  Prescriptions: WELLBUTRIN SR 150 MG XR12H-TAB (BUPROPION HCL) 1 tab by mouth every morning.  #30 x 0   Entered and  Authorized by:   Ruthe Mannan MD   Signed by:   Ruthe Mannan MD on 03/09/2010   Method used:   [facility]        [facility]       [address]       [place], [place]  [address]       Ph: [phone] or [phone]       Fax: [phone]   RxID:   [id number] AMOXICILLIN 500 MG TABS (AMOXICILLIN) 1 tab by mouth two times a day x 10 days  #20 x 0   Entered and Authorized by:   Ruthe Mannan MD   Signed by:   Ruthe Mannan MD on 03/09/2010   Method used:   [facility]        [facility] (retail)       [address]       [place], [place]  [address]       Ph: [phone] or [phone]       Fax: [phone]   RxID:   [id number]   Current Allergies (reviewed today): No known allergies

## 2010-11-29 NOTE — Letter (Signed)
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  If you need additional information, please feel free to contact our office.         Sincerely,    Ruthe Mannan MD

## 2010-11-29 NOTE — Assessment & Plan Note (Signed)
Summary: 2 WK F/U DLO   Vital Signs:  Patient profile:   60 year old female Height:      63.50 inches Weight:      190.38 pounds BMI:     33.32 Temp:     98.4 degrees F oral Pulse rate:   68 / minute Pulse rhythm:   regular BP sitting:   130 / 90  (left arm) Cuff size:   large  Vitals Entered By: Linde Gillis CMA Duncan Dull) (Mar 24, 2010 3:49 PM) CC: 2 week follow up   History of Present Illness: 60 yo here for follow up depression.   Depression- started Wellbutrn 150 mg daily on 5/11 for depression.  anhedonia, fatigue, insomina, tearfulness about the same.  No SI or HI. No anxiety. Has been on going for about 7 months, found out her husband has been unfaithful.  Tells me today about her strained relationship with her son since he married his wife 5 years ago.  His wife keeps her grandchildren away from her and it makes her very sad. She feels this is at the root of her depression.  Allergies (verified): No Known Drug Allergies  Review of Systems      See HPI Psych:  Denies sense of great danger, suicidal thoughts/plans, thoughts of violence, unusual visions or sounds, and thoughts /plans of harming others.  Physical Exam  General:  Well-developed,well-nourished,in no acute distress; alert,appropriate and cooperative throughout examination  Psych:  normally interactive and good eye contact.     Impression & Recommendations:  Problem # 1:  DEPRESSION, MILD (ICD-311) Assessment Unchanged Will increase Wellbutrin to 150 mg two times a day. Pt in agreement with plan. Will follow up again in one month.  Does not want to go to therapy but is ok talking with me about personal issues.  She wants to bring her husband to her next appt. Her updated medication list for this problem includes:    Wellbutrin Sr 150 Mg Xr12h-tab (Bupropion hcl) .Marland Kitchen... 1 tab by mouth every morning and 1 tab by mouth mid day.  Complete Medication List: 1)  Adult Aspirin Ec Low Strength 81 Mg Tbec  (Aspirin) .... Take 1 tablet by mouth once a day 2)  Advil 200 Mg Caps (Ibuprofen) .... As needed 3)  Wellbutrin Sr 150 Mg Xr12h-tab (Bupropion hcl) .Marland Kitchen.. 1 tab by mouth every morning and 1 tab by mouth mid day. 4)  Topamax 50 Mg Tabs (Topiramate) .... Take one tablet by mouth twice daily Prescriptions: WELLBUTRIN SR 150 MG XR12H-TAB (BUPROPION HCL) 1 tab by mouth every morning and 1 tab by mouth mid day.  #60 x 6   Entered and Authorized by:   Ruthe Mannan MD   Signed by:   Ruthe Mannan MD on 03/24/2010   Method used:   [facility]        [facility] [facility]ail)       [address]       [place], [place]  [address]       Ph: [phone] or [phone]       Fax: [phone]   RxID:   [id number]   Current Allergies (reviewed today): No known allergies

## 2010-11-29 NOTE — Letter (Signed)
Summary: Generic Letter  [facility]  [address] [place], [place] [address]   Phone: [phone]  Fax: [phone]    01/05/2010    Taiylor Collington [address] [place], [place]  [address]    Dear Ms. Brodt,   Vitamin  D level is very low, which may explain some of your fatigue. Please take 50,000 units of Vit D weekly, (prescription sent to your pharmacy) then 1000 units daily and recheck in 3 months.  Lab appointment scheduled:  June       Sincerely,   Lugene Fuquay CMA [facility])

## 2010-11-29 NOTE — Assessment & Plan Note (Signed)
Summary: CRAMPING AND EXTREME FATIGUE/DLO   Vital Signs:  Patient profile:   60 year old female Height:      63.5 inches Weight:      194 pounds BMI:     33.95 Temp:     98.4 degrees F oral Pulse rate:   92 / minute Pulse rhythm:   regular BP sitting:   114 / 80  (left arm) Cuff size:   regular  Vitals Entered By: Delilah Shan CMA Duncan Dull) (December 20, 2009 8:54 AM) CC: Cramping and extreme fatigue   History of Present Illness: 61 yo female new to me here with complaint of abdominal cramping and fatigue.  Abdominal cramping- feels like mestrual cramps, right>left.  No vaginal bleeding. G4P2 s/p  partial hysterectomy for endometriosis 20 years ago.  No fevers, n/v/d.  No yellowing of skin.  Never had anything like this before.  She does have at least one ovary (she thinks right).  No weight loss, fevers or chills but she is having extreme fatigue that started about same time.  Fatigue- started a month ago, accompanied by the abdominal cramping.   Feels like she cannot get enough sleep.  Sometimes short of breath with exertion.  No CP, LE edema, changes in her bowels, blood in stool.  Never had a colonscopy.  Current Medications (verified): 1)  Adult Aspirin Ec Low Strength 81 Mg  Tbec (Aspirin) .... Take 1 Tablet By Mouth Once A Day 2)  Advil 200 Mg  Caps (Ibuprofen) .... As Needed  Allergies (verified): No Known Drug Allergies  Review of Systems      See HPI General:  Complains of fatigue; denies chills, fever, loss of appetite, sweats, and weakness. Eyes:  Denies blurring. ENT:  Denies difficulty swallowing. CV:  Denies chest pain or discomfort. Resp:  Complains of shortness of breath; denies sputum productive and wheezing. GI:  Denies abdominal pain, dark tarry stools, diarrhea, nausea, and vomiting. MS:  Denies joint pain, joint redness, joint swelling, and loss of strength. Derm:  Denies rash. Neuro:  Denies brief paralysis, difficulty with concentration,  disturbances in coordination, falling down, and headaches. Psych:  Denies anxiety and depression. Endo:  Denies cold intolerance, heat intolerance, and weight change. Heme:  Denies abnormal bruising, bleeding, enlarge lymph nodes, fevers, pallor, and skin discoloration.  Physical Exam  General:  Well-developed,well-nourished,in no acute distress; alert,appropriate and cooperative throughout examination Weight stable. Eyes:  No corneal or conjunctival inflammation noted. EOMI. Perrla. Funduscopic exam benign, without hemorrhages, exudates or papilledema. Vision grossly normal. Mouth:  MMM Neck:  No deformities, masses, or tenderness noted. Lungs:  Normal respiratory effort, chest expands symmetrically. Lungs are clear to auscultation, no crackles or wheezes. Heart:  Normal rate and regular rhythm. S1 and S2 normal without gallop, murmur, click, rub or other extra sounds. Abdomen:  soft.   mildly TTP througout. Pos BS, no rebound, no guarding. Extremities:  no edema either lower legs Neurologic:  alert & oriented X3, cranial nerves II-XII intact, strength normal in all extremities, sensation intact to light touch, gait normal, DTRs symmetrical and normal, finger-to-nose normal, and Romberg negative--no palmer drift  able to heel-toe-tandum walk without difficulty Psych:  normally interactive and good eye contact.     Impression & Recommendations:  Problem # 1:  RLQ PAIN (ICD-789.03) Assessment New Given duration of symptoms unlikely appendicitis.  Perhaps diverticulosis. Will get RLQ pelvic and abdominal ultrasound to rule out malignancy, also check CA 125 given associated fatigue. Needs colonoscopy, will set up  today. Orders: Radiology Referral (Radiology) T- * Misc. Laboratory test [phone]) Specimen Handling (60454)  Problem # 2:  FATIGUE (ICD-780.79) Assessment: New May be related to #1. Given DOE, concern for anemia, check a CBC, B12/Folate, TSH, LFTs, BMET, VIt D. If labs  unremarkable along with above imaging will need GI referral if symptoms persist. Orders: Venipuncture (09811) TLB-B12 + Folate Pnl ([id number]-Z30/QMV) TLB-CBC Platelet - w/Differential (85025-CBCD) TLB-TSH (Thyroid Stimulating Hormone) (84443-TSH) TLB-Hepatic/Liver Function Pnl (80076-HEPATIC) TLB-BMP (Basic Metabolic Panel-BMET) (80048-METABOL) T-Vitamin D (25-Hydroxy) (78469-62952)  Complete Medication List: 1)  Adult Aspirin Ec Low Strength 81 Mg Tbec (Aspirin) .... Take 1 tablet by mouth once a day 2)  Advil 200 Mg Caps (Ibuprofen) .... As needed  Patient Instructions: 1)  Good to meet you, Ms. Vanderzanden. 2)  Please stop by to see Shirlee Limerick on your way out to set up your ultrasound and colonoscopy. 3)  I will call you in a couple of days with your lab results.  Current Allergies (reviewed today): No known allergies

## 2010-11-29 NOTE — Letter (Signed)
Summary: Nicole Daniels letter  [place] [facility]  [address] [place], [place] [address]   Phone: [phone]  Fax: [phone]       06/02/2010 MRN: [id number]  Nicole Daniels [address] [place], [place]  [address]  Dear Ms. [facility] [place] and [place] announce the retirement of Arta Silence, M.D., from full-time practice at the [facility] office effective April 28, 2010 and his plans of returning part-time.  It is important to Dr. Hetty Ely and to our practice that you understand that [facility] has seven physicians in our office for your health care needs.  We will continue to offer the same exceptional care that you have today.    Dr. Hetty Ely has spoken to many of you about his plans for retirement and returning part-time in the fall.   We will continue to work with you through the transition to schedule appointments for you in the office and meet the high standards that [facility] is committed to.   Again, it is with great pleasure that we share the news that Dr. Hetty Ely will return to [facility] in October of 2011 with a reduced schedule.    If you have any questions, or would like to request an appointment with one of our physicians, please call us at [phone] and press the option for Scheduling an appointment.  We take pleasure in providing you with excellent patient care and look forward to seeing you at your next office visit.  Our [facility] Physicians are:  Tillman Abide, M.D. Laurita Quint, M.D. Roxy Manns, M.D. Kerby Nora, M.D. Jayvian Escoe Beat, M.D. Ruthe Mannan, M.D. We proudly welcomed Raechel Ache, M.D. and Eustaquio Boyden, M.D. to the practice in July/August 2011.  Sincerely,  [facility]

## 2010-11-29 NOTE — Progress Notes (Signed)
Summary: ct scan   Phone Note Call from Patient Call back at Home Phone [phone]   Caller: Patient Call For: Shaune Leeks MD Summary of Call: Patient is having CT scan done this afternoon and wanted to let us know not to talk to anyone else about the results.  Initial call taken by: Melody Comas,  December 24, 2009 1:48 PM  Follow-up for Phone Call        Noted. Follow-up by: Shaune Leeks MD,  December 24, 2009 6:01 PM

## 2010-11-29 NOTE — Progress Notes (Signed)
Summary: pt is asking for CT results  Phone Note Call from Patient Call back at Home Phone [phone] Call back at [phone]   Caller: Patient Summary of Call: Please review CT report.  Pt is asking for results. Initial call taken by: Lowella Petties CMA,  December 30, 2009 2:31 PM  Follow-up for Phone Call        Please let pt know following results: No acute process in Abdomen or pelvis. 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 Cyst in L kidney, typically not a problem. Might be followed but has nothing todo with her symptomatology. Follow-up by: Shaune Leeks MD,  December 30, 2009 4:53 PM  Additional Follow-up for Phone Call Additional follow up Details #1::        Advised pt, she will let us know if she doesnt get better. Additional Follow-up by: Lowella Petties CMA,  December 30, 2009 4:58 PM

## 2010-11-29 NOTE — Miscellaneous (Signed)
Summary: [facility] PREVISIT/PREP  Clinical Lists Changes  Medications: Added new medication of DULCOLAX 5 MG  TBEC (BISACODYL) Day before procedure take 2 at 3pm and 2 at 8pm. - Signed Added new medication of METOCLOPRAMIDE HCL 10 MG  TABS (METOCLOPRAMIDE HCL) As per prep instructions. - Signed Added new medication of MIRALAX   POWD (POLYETHYLENE GLYCOL 3350) As per prep  instructions. - Signed Rx of DULCOLAX 5 MG  TBEC (BISACODYL) Day before procedure take 2 at 3pm and 2 at 8pm.;  #4 x 0;  Signed;  Entered by: Wyona Almas RN;  Authorized by: Hart Carwin MD;  Method used: [facility]  [place] [place], [place]  [address], Ph: [phone] or [phone], Fax: [phone] Rx of METOCLOPRAMIDE HCL 10 MG  TABS (METOCLOPRAMIDE HCL) As per prep instructions.;  #2 x 0;  Signed;  Entered by: Wyona Almas RN;  Authorized by: Hart Carwin MD;  Method used: [facility]ically [facility]  [place], [place], [place]  [address], Ph: [phone] or [phone], Fax: [phone] Rx of MIRALAX   POWD (POLYETHYLENE GLYCOL 3350) As per prep  instructions.;  #255gm x 0;  Signed;  Entered by: Wyona Almas RN;  Authorized by: Hart Carwin MD;  Method used: [facility]ically [facility]  [place] [place], [place]  [address], Ph: [phone] or [phone], Fax: [phone] Observations: Added new observation of NKA: T (12/30/2009 15:06)    Prescriptions: MIRALAX   POWD (POLYETHYLENE GLYCOL 3350) As per prep  instructions.  #255gm x 0   Entered by:   Wyona Almas RN   Authorized by:   Hart Carwin MD   Signed by:   Wyona Almas RN on 12/30/2009   Method used:   [facility]ronically [facility]        [facility]  [place] (retail)       [address]       [place], [place]  [address]       Ph: [phone] or [phone]       Fax: [phone]   RxID:   [id number] METOCLOPRAMIDE HCL 10 MG  TABS (METOCLOPRAMIDE HCL)  As per prep instructions.  #2 x 0   Entered by:   Wyona Almas RN   Authorized by:   Hart Carwin MD   Signed by:   Wyona Almas RN on 12/30/2009   Method used:   [facility]ron[facility]        [facility]  [place] (retail)       [address]       [place], [place]  [address]       Ph: [phone] or [phone]       Fax: [phone]   RxID:   [id number] DULCOLAX 5 MG  TBEC (BISACODYL) Day before procedure take 2 at 3pm and 2 at 8pm.  #4 x 0   Entered by:   Wyona Almas RN   Authorized by:   Hart Carwin MD   Signed by:   Wyona Almas RN on 12/30/2009   Method used:   [facility]        [facility]  [place] (retail)       [address]       [place], [place]  [address]       Ph: [phone] or [phone]       Fax: [phone]   RxID:   [id number]

## 2010-11-29 NOTE — Letter (Signed)
Summary: Out of Work  [facility] [employer] [facility] at [facility]  [address] [place], [place] [address]   Phone: [phone]  Fax: [phone]    April 06, 2010   Employee:  CAMRI MOLLOY    To Whom It May Concern:  Patient is cleared to drive at work.  No restrictions.  If you need additional information, please feel free to contact our office.         Sincerely,    Ruthe Mannan MD

## 2010-11-29 NOTE — Assessment & Plan Note (Signed)
Summary: 1 WEEK FOLLOW UP   Vital Signs:  Patient profile:   60 year old female Height:      63.5 inches Weight:      192 pounds BMI:     33.60 Temp:     98.2 degrees F oral Pulse rate:   68 / minute Pulse rhythm:   regular BP sitting:   140 / 100  (left arm) Cuff size:   regular  Vitals Entered By: Linde Gillis CMA Duncan Dull) (Mar 15, 2010 12:02 PM) CC: 1 week follow up   History of Present Illness: 60 yo here with no improvement of sinus pressure.  1.  Nasal congestion, sinus pressure, feeling feverish x 2 week. Dry cough.  No SOB or wheezing. Taking mucinex OTC, no relief of symptoms. Amoxicillin not helping.  Depression- started Wellbutrn 150 mg daily on 5/11 for depression.  anhedonia, fatigue, insomina, tearfulness about the same.  No SI or HI. No anxiety. Has been on going for about 7 months, found out her husband has been unfaithful.  Current Medications (verified): 1)  Adult Aspirin Ec Low Strength 81 Mg  Tbec (Aspirin) .... Take 1 Tablet By Mouth Once A Day 2)  Advil 200 Mg  Caps (Ibuprofen) .... As Needed 3)  Wellbutrin Sr 150 Mg Xr12h-Tab (Bupropion Hcl) .Marland Kitchen.. 1 Tab By Mouth Every Morning. 4)  Topamax 50 Mg Tabs (Topiramate) .... Take One Tablet By Mouth Twice Daily 5)  Augmentin 875-125 Mg Tabs (Amoxicillin-Pot Clavulanate) .Marland Kitchen.. 1 By Mouth 2 Times Daily X 10 Days 6)  Diflucan 150 Mg Tabs (Fluconazole) .... Once Daily  Allergies (verified): No Known Drug Allergies  Review of Systems      See HPI General:  Complains of chills and fever. ENT:  Complains of nasal congestion, postnasal drainage, and sinus pressure. Resp:  Complains of cough; denies shortness of breath, sputum productive, and wheezing. Psych:  Complains of depression; denies anxiety, suicidal thoughts/plans, thoughts of violence, unusual visions or sounds, and thoughts /plans of harming others.  Physical Exam  General:  Well-developed,well-nourished,in no acute distress; alert,appropriate and  cooperative throughout examination  Ears:  yellow fluid B TMS, no erythema Nose:  nasal discharge, mucosal pallor.   Frontal sinuses TTP Mouth:  MMM Lungs:  Normal respiratory effort, chest expands symmetrically. Lungs are clear to auscultation, no crackles or wheezes. Heart:  Normal rate and regular rhythm. S1 and S2 normal without gallop, murmur, click, rub or other extra sounds. Psych:  normally interactive and good eye contact.     Impression & Recommendations:  Problem # 1:  DEPRESSION, MILD (ICD-311) Assessment Unchanged Discussed that we really need more time to see if Wellbutirn is working.  Follow up with me in 2 weeks.  Discussed psychotherapy, she is not interested at this time. Her updated medication list for this problem includes:    Wellbutrin Sr 150 Mg Xr12h-tab (Bupropion hcl) .Marland Kitchen... 1 tab by mouth every morning.  Problem # 2:  SINUSITIS - ACUTE-NOS (ICD-461.9) Assessment: Unchanged Stop amoxicillin, start Augmentin.  Follow up in 2 weeks. The following medications were removed from the medication list:    Amoxicillin 500 Mg Tabs (Amoxicillin) .Marland Kitchen... 1 tab by mouth two times a day x 10 days Her updated medication list for this problem includes:    Augmentin 875-125 Mg Tabs (Amoxicillin-pot clavulanate) .Marland Kitchen... 1 by mouth 2 times daily x 10 days  Complete Medication List: 1)  Adult Aspirin Ec Low Strength 81 Mg Tbec (Aspirin) .... Take 1 tablet by mouth  once a day 2)  Advil 200 Mg Caps (Ibuprofen) .... As needed 3)  Wellbutrin Sr 150 Mg Xr12h-tab (Bupropion hcl) .Marland Kitchen.. 1 tab by mouth every morning. 4)  Topamax 50 Mg Tabs (Topiramate) .... Take one tablet by mouth twice daily 5)  Augmentin 875-125 Mg Tabs (Amoxicillin-pot clavulanate) .Marland Kitchen.. 1 by mouth 2 times daily x 10 days 6)  Diflucan 150 Mg Tabs (Fluconazole) .... Once daily Prescriptions: DIFLUCAN 150 MG TABS (FLUCONAZOLE) once daily  #1 x 0   Entered and Authorized by:   Ruthe Mannan MD   Signed by:   Ruthe Mannan MD on  03/15/2010   Method used:   [facility]        [facility]  [place] (retail)       [address]       [place], [place]  [address]       Ph: [phone] or [phone]       Fax: [phone]   RxID:   [id number] AUGMENTIN 875-125 MG TABS (AMOXICILLIN-POT CLAVULANATE) 1 by mouth 2 times daily x 10 days  #20 x 0   Entered and Authorized by:   Ruthe Mannan MD   Signed by:   Ruthe Mannan MD on 03/15/2010   Method used:   [facility]        [facility]  [place] (retail)       [address]       [place], [place]  [address]       Ph: [phone] or [phone]       Fax: [phone]   RxID:   [id number]   Current Allergies (reviewed today): No known allergies

## 2010-11-29 NOTE — Letter (Signed)
Summary: Out of Work  [facility] [employer] [facility] at [facility]  [address] [place], [place] [address]   Phone: [phone]  Fax: [phone]    September 05, 2010   Employee:  Nicole Daniels    To Whom It May Concern:   For Medical reasons, please excuse the above named employee from work for the following dates:  Start:   09/05/2010  End:   09/07/2010  If you need additional information, please feel free to contact our office.         Sincerely,    Ruthe Mannan MD

## 2010-11-29 NOTE — Letter (Signed)
Summary: [facility] Instructions  [facility]  [address] [place], [place] [address]   Phone: [phone]  Fax: [phone]       Nicole Daniels    06/28/1951    MRN: [id number]       Procedure Day Dorna Bloom:  Lenor Coffin  01/13/10     Arrival Time: 10:00AM     Procedure Time:  11:00AM     Location of Procedure:                    _X _  [facility] (4th Floor)   PREPARATION FOR COLONOSCOPY WITH MIRALAX  Starting 5 days prior to your procedure 01/08/10 do not eat nuts, seeds, popcorn, corn, beans, peas,  salads, or any raw vegetables.  Do not take any fiber supplements (e.g. Metamucil, Citrucel, and Benefiber). ____________________________________________________________________________________________________   THE DAY BEFORE YOUR PROCEDURE         DATE: 01/12/10  DAY: WEDNESDAY  1   Drink clear liquids the entire day-NO SOLID FOOD  2   Do not drink anything colored red or purple.  Avoid juices with pulp.  No orange juice.  3   Drink at least 64 oz. (8 glasses) of fluid/clear liquids during the day to prevent dehydration and help the prep work efficiently.  CLEAR LIQUIDS INCLUDE: Water Jello Ice Popsicles Tea (sugar ok, no milk/cream) Powdered fruit flavored drinks Coffee (sugar ok, no milk/cream) Gatorade Juice: apple, white grape, white cranberry  Lemonade Clear bullion, consomm, broth Carbonated beverages (any kind) Strained chicken noodle soup Hard Candy  4   Mix the entire bottle of Miralax with 64 oz. of Gatorade/Powerade in the morning and put in the refrigerator to chill.  5   At 3:00 pm take 2 Dulcolax/Bisacodyl tablets.  6   At 4:30 pm take one Reglan/Metoclopramide tablet.  7  Starting at 5:00 pm drink one 8 oz glass of the Miralax mixture every 15-20 minutes until you have finished drinking the entire 64 oz.  You should finish drinking prep around 7:30 or 8:00 pm.  8   If you are nauseated, you may take the 2nd  Reglan/Metoclopramide tablet at 6:30 pm.        9    At 8:00 pm take 2 more DULCOLAX/Bisacodyl tablets.     THE DAY OF YOUR PROCEDURE      DATE:  01/13/10  DAY: Lenor Coffin  You may drink clear liquids until 9:00AM  (2 HOURS BEFORE PROCEDURE).   MEDICATION INSTRUCTIONS  Unless otherwise instructed, you should take regular prescription medications with a small sip of water as early as possible the morning of your procedure.        OTHER INSTRUCTIONS  You will need a responsible adult at least 61 years of age to accompany you and drive you home.   This person must remain in the waiting room during your procedure.  Wear loose fitting clothing that is easily removed.  Leave jewelry and other valuables at home.  However, you may wish to bring a book to read or an iPod/MP3 player to listen to music as you wait for your procedure to start.  Remove all body piercing jewelry and leave at home.  Total time from sign-in until discharge is approximately 2-3 hours.  You should go home directly after your procedure and rest.  You can resume normal activities the day after your procedure.  The day of your procedure you should not:   Drive   Make  legal decisions   Operate machinery   Drink alcohol   Return to work  You will receive specific instructions about eating, activities and medications before you leave.   The above instructions have been reviewed and explained to me by   Wyona Almas RN  December 30, 2009 3:32 PM     I fully understand and can verbalize these instructions _____________________________ Date _______

## 2010-12-01 NOTE — Assessment & Plan Note (Signed)
Summary: np6/dx:chest pain/lg      Allergies Added: NKDA  Visit Type:  NP Primary Provider:  Dr. Ruthe Mannan  CC:  palpitations, chest pressure, SOB, and A-fib.  History of Present Illness: [name] presents for evaluation of dyspnea and chest tightness.  Paitent has been getting short of breath after walking up 1 flight of steps since 11/11.  She is also short of breath walking up inclines but does ok on flat ground.  She additionally has been getting episodes of chest tightness 1-2 times a week.  This is often associated with walking.  It can last for up to 5 minutes and resolves with rest.  It is not associated in particular with the exertional dyspnea.  This has also been a stable pattern since 11/11.  She had a negative myoview in 2009.  She does not remember the indication for the test.    ECG: NSR, normal  Labs (2/11): TSH normal, K 3.6, creatinine 0.8  Preventive Screening-Counseling & Management  Alcohol-Tobacco     Smoking Status: never  Current Medications (verified): 1)  Adult Aspirin Ec Low Strength 81 Mg  Tbec (Aspirin) .... Take 1 Tablet By Mouth Once A Day 2)  Advil 200 Mg  Caps (Ibuprofen) .... As Needed  Allergies (verified): No Known Drug Allergies  Past History:  Past Medical History: 1. Hyperlipidemia (03/30/2001) 2. Migraine headaches 3. OA knee 4. Vitamin D deficiency 5. ETT-myoview (10/09): normal study.  6. HTN: not on meds 7. Obesity  Family History: No premature CAD CVA-  Prostate Cancer-0 Breast Cancer- P cousin Ovarian Cancer-0 Uterine Cancer-0 Colon Cancer-0 Drug/ ETOH Abuse- Depression-  Father cirrhosis  Social History: Marital Status: Married Lives in [place] Children: 2 Occupation: Routes buses for [employer] Tobacco Use - No.  Alcohol Use - no  Review of Systems       All systems reviewed and negative except as per HPI.   Vital Signs:  Patient  profile:   60 year old female Height:      63.50 inches Weight:      193.25 pounds BMI:     33.82 Pulse rate:   60 / minute BP sitting:   144 / 96  (left arm) Cuff size:   regular  Vitals Entered By: Caralee Ates CMA (November 23, 2010 11:53 AM)  Physical Exam  General:  Well developed, well nourished, in no acute distress.  Obese.  Head:  normocephalic and atraumatic Nose:  no deformity, discharge, inflammation, or lesions Mouth:  Teeth, gums and palate normal. Oral mucosa normal. Neck:  Neck supple, no JVD. No masses, thyromegaly or abnormal cervical nodes. Lungs:  Clear bilaterally to auscultation and percussion. Heart:  Non-displaced PMI, chest non-tender; regular rate and rhythm, S1, S2 without murmurs, rubs or gallops. Carotid upstroke normal, no bruit.  Pedals normal pulses. No edema, no varicosities. Abdomen:  Bowel sounds positive; abdomen soft and non-tender without masses, organomegaly, or hernias noted. No hepatosplenomegaly. Extremities:  No clubbing or cyanosis. Neurologic:  Alert and oriented x 3. Skin:  Intact without lesions or rashes. Psych:  Normal affect.   Impression & Recommendations:  Problem # 1:  CHEST PAIN (ICD-786.50) Patient has been having episodes of exertional chest tightness as well as exertional dyspnea.  They do not occur together.  She has a negative myoview in 2009 but does not remember he symptoms at that time.  Cardiac risk factors are hyperlipidemia and HTN.  I  will get a stress test for risk stratification.  I will enroll her in the PROMISE study.  She will be randomized to [employer] (does not want to walk on treadmill with knee pain) versus coronary CT angiogram.  I will also get an echocardiogram given dyspnea to assess LV, RV and valves.   Problem # 2:  HYPERLIPIDEMIA (ICD-272.4) Will check fasting lipids (has not had recently).    Other Orders: Echocardiogram (Echo)  Patient Instructions: 1)  Your physician has requested that you  have an echocardiogram.  Echocardiography is a painless test that uses sound waves to create images of your heart. It provides your doctor with information about the size and shape of your heart and how well your heart's chambers and valves are working.  This procedure takes approximately one hour. There are no restrictions for this procedure. 2)  Fasting lipid profile/liver profile/BMP/CBC/TSH 786.50 3)  Appointment with Dr Shirlee Latch about 2 weeks after you have had the testing done. 4)  Take and record your blood pressure. Bring the readings to the appointment when you see Dr Shirlee Latch. 5)  Research will talk with you about the Promise Study.

## 2010-12-01 NOTE — Assessment & Plan Note (Signed)
Summary: HEADACHES, NECK HURTS   Vital Signs:  Patient profile:   60 year old female Height:      63.50 inches Weight:      193.25 pounds BMI:     33.82 Temp:     98.2 degrees F oral Pulse rate:   73 / minute Pulse rhythm:   regular BP sitting:   122 / 72  (left arm) Cuff size:   regular  Vitals Entered By: Linde Gillis CMA Duncan Dull) (November 24, 2010 11:41 AM) CC: headaches, neck pain   History of Present Illness: 60 yo treated for sinusitis on 11/09/10 here for persistent headaches.  Sinus headaches have improved but still having headaches in back of her head, associated with photophobia and nausea.  Pt has a h/o migraines, followed by Dr Anne Hahn (neuro).  Per pt had an MRI in 06/2010 that showed no acute findings.  Was on Topamax but ran out 2 months ago and has not been back to see Dr. Anne Hahn.  Denies any other focal neurological symptoms.  Current Medications (verified): 1)  Adult Aspirin Ec Low Strength 81 Mg  Tbec (Aspirin) .... Take 1 Tablet By Mouth Once A Day 2)  Advil 200 Mg  Caps (Ibuprofen) .... As Needed 3)  Topamax 25 Mg Tabs (Topiramate) .Marland Kitchen.. 1 Tab By Mouth Two Times A Day 4)  Imitrex 100 Mg Tabs (Sumatriptan Succinate) .Marland Kitchen.. 1 By Mouth At The Onset of Migraine. May Repeat Dose in One Hour If Headache Not Resolved  Allergies (verified): No Known Drug Allergies  Past History:  Past Medical History: Last updated: 11/23/2010 1. Hyperlipidemia (03/30/2001) 2. Migraine headaches 3. OA knee 4. Vitamin D deficiency 5. ETT-myoview (10/09): normal study.  6. HTN: not on meds 7. Obesity  Past Surgical History: Last updated: 08/25/2008 nuclear stress test--08/05/08--nl, EF 82% partial hysterectomy--R ovary remains  Family History: Last updated: 11/23/2010 No premature CAD CVA-  Prostate Cancer-0 Breast Cancer- P cousin Ovarian Cancer-0 Uterine Cancer-0 Colon Cancer-0 Drug/ ETOH Abuse- Depression-  Father cirrhosis  Social History: Last updated:  11/23/2010 Marital Status: Married Lives in [place] Children: 2 Occupation: Routes buses for [employer] Tobacco Use - No.  Alcohol Use - no  Risk Factors: Caffeine Use: 4 (08/25/2008) Exercise: no (08/25/2008)  Risk Factors: Smoking Status: never (11/23/2010) Passive Smoke Exposure: no (08/25/2008)  Review of Systems      See HPI General:  Denies fever. Eyes:  Complains of light sensitivity; denies blurring. GI:  Complains of nausea; denies vomiting. Neuro:  Complains of headaches; denies disturbances in coordination, falling down, inability to speak, memory loss, numbness, poor balance, seizures, tingling, tremors, and weakness.  Physical Exam  General:  Well-developed,well-nourished,in no acute distress; alert,appropriate and cooperative throughout examination Afebrile,  nontoxic appearing.  Mouth:  MMM Neurologic:  alert & oriented X3, cranial nerves II-XII intact, strength normal in all extremities Psych:  normally interactive and good eye contact.     Impression & Recommendations:  Problem # 1:  MIGRAINE, CHRONIC (ICD-346.90) Assessment Deteriorated restart topamax for prophylaxis. imitrex as needed for HA. advised to schedule immediate follow up with Dr. Anne Hahn and ask his office to send [place] copies of her MRIs and office notes. Her updated medication list for this problem includes:    Adult Aspirin Ec Low Strength 81 Mg Tbec (Aspirin) .Marland Kitchen... Take 1 tablet by mouth once a day    Advil 200 Mg Caps (Ibuprofen) .Marland Kitchen... As needed    Imitrex 100 Mg Tabs (Sumatriptan succinate) .Marland Kitchen... 1 by  mouth at the onset of migraine. may repeat dose in one hour if headache not resolved  Complete Medication List: 1)  Adult Aspirin Ec Low Strength 81 Mg Tbec (Aspirin) .... Take 1 tablet by mouth once a day 2)  Advil 200 Mg Caps (Ibuprofen) .... As needed 3)  Topamax 25 Mg Tabs (Topiramate) .Marland Kitchen.. 1 tab by mouth two times a day 4)  Imitrex 100 Mg Tabs (Sumatriptan  succinate) .Marland Kitchen.. 1 by mouth at the onset of migraine. may repeat dose in one hour if headache not resolved   Patient Instructions: 1)  Let's restart your Topamax. 2)  Take Imitrex for migraines. 3)  Please call Dr. Anne Hahn office to make an appointment and please ask them to send MRI results. Prescriptions: IMITREX 100 MG TABS (SUMATRIPTAN SUCCINATE) 1 by mouth at the onset of migraine. May repeat dose in one hour if headache not resolved  #10 x 3   Entered and Authorized by:   Ruthe Mannan MD   Signed by:   Ruthe Mannan MD on 11/24/2010   Method used:   [facility]        [facility]  [address] (retail)       [address]       [place], [place]  [address]       Ph: [phone] or [phone]       Fax: [phone]   RxID:   [id number] TOPAMAX 25 MG TABS (TOPIRAMATE) 1 tab by mouth two times a day  #60 x 3   Entered and Authorized by:   Ruthe Mannan MD   Signed by:   Ruthe Mannan MD on 11/24/2010   Method used:   [facility]        [facility] (retail)       [address]       [place], [place]  [address]       Ph: [phone] or [phone]       Fax: [phone]   RxID:   [id number]    Orders Added: 1)  Est. Patient Level IV [25427]    Current Allergies (reviewed today): No known allergies

## 2010-12-01 NOTE — Assessment & Plan Note (Signed)
Summary: ?SINUS INFECTION,HEMORRHOIDS/CLE   Vital Signs:  Patient profile:   60 year old female Height:      63.50 inches Weight:      193.50 pounds BMI:     33.86 Temp:     98.0 degrees F oral Pulse rate:   72 / minute Pulse rhythm:   regular BP sitting:   120 / 90  (left arm) Cuff size:   large  Vitals Entered By: Linde Gillis CMA Duncan Dull) (November 09, 2010 10:46 AM) CC: sinus infection   History of Present Illness: 85 here for ? sinusitis.  Symptoms started over two weeks ago.  Started with sneezing, runny nose, now with facial pressure. Teeth hurting, right ear was hurting last night. Subjective fevers, chills. No SOB. No CP. Cough is dry. Augmentin caused nausea.  Taking Ibuprofen.  As we were finishing visit, she mentioned that she is SOB and having CP on exertion.  Sometimes tingling down left arm.   Rest improves her symptoms.  No h/o CAD but has had intermittent CP in past.  Saw Dr. Myrtis Ser in 2009.  Records reviewed- neg cardiolite on 08/05/08. No diaphoresis ordizziness associated with symptoms.  Take 81 mg ASA daily.   h/o HLD, non smoker.    Current Medications (verified): 1)  Adult Aspirin Ec Low Strength 81 Mg  Tbec (Aspirin) .... Take 1 Tablet By Mouth Once A Day 2)  Advil 200 Mg  Caps (Ibuprofen) .... As Needed 3)  Azithromycin 250 Mg  Tabs (Azithromycin) .... 2 By  Mouth Today and Then 1 Daily For 4 Days  Allergies (verified): No Known Drug Allergies  Past History:  Past Medical History: Last updated: 08/13/2008 Hyperlipidemia (03/30/2001)  Past Surgical History: Last updated: 08/25/2008 nuclear stress test--08/05/08--nl, EF 82% partial hysterectomy--R ovary remains  Family History: Last updated: 08/25/2008 Father:  Mother:  Siblings:    DM- MI- CVA-  Prostate Cancer-0 Breast Cancer- P cousin Ovarian Cancer-0 Uterine Cancer-0 Colon Cancer-0 Drug/ ETOH Abuse- Depression-   Social History: Last updated: 09/20/2009 Marital Status:  Married Children: 2 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 ENT:  Complains of nasal congestion, postnasal drainage, sinus pressure, and sore throat. CV:  Complains of chest pain or discomfort. Resp:  Complains of shortness of breath. GI:  Denies abdominal pain.  Physical Exam  General:  Well-developed,well-nourished,in no acute distress; alert,appropriate and cooperative throughout examination Afebrile,  nontoxic appearing.  Ears:  yellow fluid B TMS, no erythema Nose:  nasal discharge, mucosal pallor.   Frontal and maxillary sinuses TTP Mouth:  MMM Lungs:  Normal respiratory effort, chest expands symmetrically. Lungs are clear to auscultation, no crackles or wheezes. Heart:  Normal rate and regular rhythm. S1 and S2 normal without gallop, murmur, click, rub or other extra sounds. Extremities:  no edema either lower legs Neurologic:  alert & oriented X3, cranial nerves II-XII intact, strength normal in all extremities Skin:  Intact without suspicious lesions or rashes Cervical Nodes:  No lymphadenopathy noted Psych:  normally interactive and good eye contact.     Impression & Recommendations:  Problem # 1:  CHEST PAIN (ICD-786.50) Assessment New EKG nsr.  Will refer back to Dr. Myrtis Ser for further work up, consider repeat stress test.   Orders: EKG w/  Interpretation (93000) Cardiology Referral (Cardiology)  Problem # 2:  SINUSITIS - ACUTE-NOS (ICD-461.9) Assessment: New With h/o chronic sinsusitis.  Given duration and progression of symptoms, will treat with Zpack. Intolerance to Augmentin (nausea).   The following medications were removed from the medication list:    Zithromax Z-pak 250 Mg Tabs (Azithromycin) .Marland Kitchen...  Take by mouth as directed Her updated medication list for this problem includes:    Azithromycin 250 Mg Tabs (Azithromycin) .Marland Kitchen... 2 by  mouth today and then 1 daily for 4 days  Complete Medication List: 1)  Adult Aspirin Ec Low Strength 81 Mg Tbec (Aspirin) .... Take 1 tablet by mouth once a day 2)  Advil 200 Mg Caps (Ibuprofen) .... As needed 3)  Azithromycin 250 Mg Tabs (Azithromycin) .... 2 by  mouth today and then 1 daily for 4 days  Patient Instructions: 1)  Take antibiotic as directed.  Drink lots of fluids.  Treat sympotmatically with Mucinex, nasal saline irrigation, and Tylenol/Ibuprofen. Call if not improving as expected in 5-7 days.  2)  Please stop by to see Shirlee Limerick on your way out. Prescriptions: AZITHROMYCIN 250 MG  TABS (AZITHROMYCIN) 2 by  mouth today and then 1 daily for 4 days  #6 x 0   Entered and Authorized by:   Ruthe Mannan MD   Signed by:   Ruthe Mannan MD on 11/09/2010   Method used:   [facility]        [id number]       [address]       [place], [place]  [address]       Ph: [phone] or [phone]       Fax: [phone]   RxID:   [id number]    Orders Added: 1)  EKG w/ Interpretation [93000] 2)  Cardiology Referral [Cardiology] 3)  Est. Patient Level IV [36644]    Current Allergies (reviewed today): No known allergies

## 2010-12-05 ENCOUNTER — Telehealth (INDEPENDENT_AMBULATORY_CARE_PROVIDER_SITE_OTHER): Payer: Self-pay | Admitting: *Deleted

## 2010-12-06 ENCOUNTER — Encounter: Payer: Self-pay | Admitting: Cardiology

## 2010-12-06 ENCOUNTER — Ambulatory Visit (HOSPITAL_COMMUNITY): Payer: BC Managed Care – PPO | Attending: Cardiology

## 2010-12-06 ENCOUNTER — Ambulatory Visit (INDEPENDENT_AMBULATORY_CARE_PROVIDER_SITE_OTHER): Payer: BC Managed Care – PPO | Admitting: Cardiology

## 2010-12-06 DIAGNOSIS — I10 Essential (primary) hypertension: Secondary | ICD-10-CM

## 2010-12-06 DIAGNOSIS — R079 Chest pain, unspecified: Secondary | ICD-10-CM | POA: Insufficient documentation

## 2010-12-06 DIAGNOSIS — R0789 Other chest pain: Secondary | ICD-10-CM

## 2010-12-06 DIAGNOSIS — I4949 Other premature depolarization: Secondary | ICD-10-CM

## 2010-12-06 DIAGNOSIS — R0989 Other specified symptoms and signs involving the circulatory and respiratory systems: Secondary | ICD-10-CM

## 2010-12-07 NOTE — Miscellaneous (Signed)
Summary: Order for [facility]  Clinical Lists Changes  Orders: Added new Referral order of Nuclear Stress Test (Nuc Stress Test) - Signed

## 2010-12-08 ENCOUNTER — Other Ambulatory Visit (HOSPITAL_COMMUNITY): Payer: Self-pay

## 2010-12-11 ENCOUNTER — Encounter: Payer: Self-pay | Admitting: Family Medicine

## 2010-12-15 NOTE — Assessment & Plan Note (Signed)
Summary: Cardiology Nuclear Testing  Nuclear Med Background Indications for Stress Test: Evaluation for Ischemia   History: Echo, Myocardial Perfusion Study  History Comments: '09 [id number], EF=82%; 11/28/10 Echo:EF=55%, mild LVH; h/o afib.  Symptoms: Chest Tightness with Exertion, DOE, Fatigue, Palpitations, SOB    Nuclear Pre-Procedure Cardiac Risk Factors: Hypertension, Lipids Caffeine/Decaff Intake: none NPO After: 7:00 PM Lungs: clear IV 0.9% NS with Angio Cath: 22g     IV Site: R Hand IV Started by: Cathlyn Parsons, RN Chest Size (in) 42     Cup Size C     Height (in): 63.50 Weight (lb): 190 BMI: 33.25  Nuclear Med Study 1 or 2 day study:  1 day     Stress Test Type:  Treadmill/Lexiscan Reading MD:  Olga Millers, MD     Referring MD:  D.McLean Resting Radionuclide:  Technetium 74m Tetrofosmin     Resting Radionuclide Dose:  11 mCi  Stress Radionuclide:  Technetium 80m Tetrofosmin     Stress Radionuclide Dose:  33 mCi   Stress Protocol  Max Systolic BP: 176 mm Hg Lexiscan: 0.4 mg   Stress Test Technologist:  Milana Na, EMT-P     Nuclear Technologist:  Domenic Polite, CNMT  Rest Procedure  Myocardial perfusion imaging was performed at rest 45 minutes following the intravenous administration of Technetium 80m Tetrofosmin.  Stress Procedure  The patient received IV Lexiscan 0.4 mg over 15-seconds with concurrent low level exercise and then Technetium 70m Tetrofosmin was injected at 30-seconds while the patient continued walking one more minute.  There were no significant changes and rare pvcs with Lexiscan.  Quantitative spect images were obtained after a 45 minute delay.  QPS Raw Data Images:  Acquisition technically good; normal left ventricular size. Stress Images:  Normal homogeneous uptake in all areas of the myocardium. Rest Images:  Normal homogeneous uptake in all areas of the myocardium. Subtraction (SDS):  No evidence of ischemia. Transient  Ischemic Dilatation:  1.02  (Normal <1.22)  Lung/Heart Ratio:  0.27  (Normal <0.45)  Quantitative Gated Spect Images QGS EDV:  67 ml QGS ESV:  17 ml QGS EF:  75 % QGS cine images:  Normal wall motion.   Overall Impression  Exercise Capacity: Lexiscan with no exercise. BP Response: Normal blood pressure response. Clinical Symptoms: No chest pain ECG Impression: No significant ST segment change suggestive of ischemia. Overall Impression: Normal lexiscan nuclear study with no ischemia or infarction.  Appended Document: Cardiology Nuclear Testing OV 12/06/10 with Dr Shirlee Latch

## 2010-12-15 NOTE — Progress Notes (Signed)
Summary: Nuclear pre procedure  Phone Note Outgoing Call Call back at Home Phone [phone]   Summary of Call: Cindy left message with information on Myoview Information Sheet (see scanned document for details).      Nuclear Med Background Indications for Stress Test: Evaluation for Ischemia   History: Echo, Myocardial Perfusion Study  History Comments: '09 [id number], EF=82%; 11/28/10 Echo:EF=55%, mild LVH; h/o afib.  Symptoms: Chest Tightness with Exertion, DOE, Palpitations, SOB    Nuclear Pre-Procedure Cardiac Risk Factors: Hypertension, Lipids Height (in): 63.50

## 2010-12-15 NOTE — Assessment & Plan Note (Signed)
Summary: f/u lexican that will be done today at 9:30/sl  Medications Added ATENOLOL 25 MG TABS (ATENOLOL) one daily      Allergies Added: NKDA  Visit Type:  Follow-up Primary Provider:  Dr. Ruthe Mannan  CC:  SHortness of breath, headaches, and dizziness.  History of Present Illness: [name] presented initially for evaluation of dyspnea and atypical chest tightness.  Paitent has been getting short of breath after walking up 1 flight of steps since 11/11.  She is also short of breath walking up inclines but does ok on flat ground.  She additionally has been getting episodes of chest tightness 1-2 times a week.  This is often associated with walking.  It can last for up to 5 minutes and resolves with rest.  It is not associated in particular with the exertional dyspnea.  This has also been a stable pattern since 11/11.    I had her do a [employer], which showed no evidence for ischemia or infarction.  Echo showed mild LV hypertrophy with normal LV systolic function.  LDL came back very high, so simvastatin was started.  Patient has had high blood pressure at both appointments with me.    Labs (2/11): TSH normal, K 3.6, creatinine 0.8 Labs (1/12): HDL 46, LDL 182, K 3.9, creatinine 0.8  Current Medications (verified): 1)  Adult Aspirin Ec Low Strength 81 Mg  Tbec (Aspirin) .... Take 1 Tablet By Mouth Once A Day 2)  Advil 200 Mg  Caps (Ibuprofen) .... As Needed 3)  Topamax 25 Mg Tabs (Topiramate) .Marland Kitchen.. 1 Tab By Mouth Two Times A Day 4)  Simvastatin 40 Mg Tabs (Simvastatin) .... One Daily in The Evening  Allergies (verified): No Known Drug Allergies  Past History:  Past Medical History: 1. Hyperlipidemia (03/30/2001) 2. Migraine headaches 3. OA knee 4. Vitamin D deficiency 5. Atypical chest pain: ETT-myoview (10/09): normal study.  Lexiscan myoview (2/12): no ischemia or infarction (normal).  6. HTN: not on meds 7. Obesity 8.  Exertional dyspnea: Echo (1/12) with mild LV hypertrophy, EF 55%, normal RV size and systolic function.   Family History: Reviewed history from 11/23/2010 and no changes required. No premature CAD CVA-  Prostate Cancer-0 Breast Cancer- P cousin Ovarian Cancer-0 Uterine Cancer-0 Colon Cancer-0 Drug/ ETOH Abuse- Depression-  Father cirrhosis  Social History: Reviewed history from 11/23/2010 and no changes required. 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 Tobacco Use - No.  Alcohol Use - no  Vital Signs:  Patient profile:   60 year old female Height:      63.50 inches Weight:      194 pounds BMI:     33.95 Pulse rate:   82 / minute BP sitting:   144 / 72  (left arm) Cuff size:   regular  Vitals Entered By: Caralee Ates CMA (December 06, 2010 1:38 PM)  Physical Exam  General:  Well developed, well nourished, in no acute distress.  Obese.  Neck:  Neck supple, no JVD. No masses, thyromegaly or abnormal cervical nodes. Lungs:  Clear bilaterally to auscultation and percussion. Heart:  Non-displaced PMI, chest non-tender; regular rate and rhythm, S1, S2 without murmurs, rubs or gallops. Carotid upstroke normal, no bruit.  Pedals normal pulses. No edema, no varicosities. Abdomen:  Bowel sounds positive; abdomen soft and non-tender without masses, organomegaly, or hernias noted. No hepatosplenomegaly. Extremities:  No clubbing or cyanosis. Neurologic:  Alert and oriented x 3. Psych:  Normal affect.   Impression & Recommendations:  Problem # 1:  CHEST PAIN (ICD-786.50) Atypical chest pain with normal Lexiscan myoview.  I suspect that the pain was noncardiac.   Problem # 2:  SHORTNESS OF BREATH (ICD-786.05) Assessment: Unchanged Exertional dyspnea.  Echo showed preserved LV EF.  Though I cannot rule out a component of diastolic CHF, I think that the main cause may be obesity/deconditioning.  I suggested  that she work on weight loss and increased exercise.    Problem # 3:  HYPERLIPIDEMIA (ICD-272.4) LDL was quite high when checked.  I have started her on simvastatin.  Lipids/LFTs in 2 months.   Problem # 4:  HYPERTENSION, UNSPECIFIED (ICD-401.9) She has HTN but is on no meds.  I am going to start her on atenolol.  This is a beta blocker, so may give her some prophylactic prevention of migraines as well (gets severe migraine-type headaches).   Patient Instructions: 1)  Your physician has recommended you make the following change in your medication:  2)  Take Atenolol 25mg  daily. 3)  Your physician recommends that you return for a FASTING lipid profile/liver profile March 29,2012.  4)  Your physician recommends that you schedule a follow-up appointment as needed with Dr Shirlee Latch. Prescriptions: ATENOLOL 25 MG TABS (ATENOLOL) one daily  #30 x 6   Entered by:   Katina Dung, RN, BSN   Authorized by:   Marca Ancona, MD   Signed by:   Katina Dung, RN, BSN on 12/06/2010   Method used:   [facility]        [facility]  [address] ([facility]ail)       [address]       [place], [place]  [address]       Ph: [phone] or [phone]       Fax: [phone]   RxID:   [id number]

## 2010-12-15 NOTE — Assessment & Plan Note (Signed)
Summary: [facility] dx 786.50/sl/[employer], ZOXW#[id number], EXP 03/03/1...   Nurse Visit   Allergies: No Known Drug Allergies

## 2010-12-21 NOTE — Letter (Signed)
Summary: [facility]  [facility]   Imported By: Kassie Mends 12/16/2010 09:18:57  _____________________________________________________________________  External Attachment:    Type:   Image     Comment:   External Document

## 2011-01-18 ENCOUNTER — Encounter: Payer: Self-pay | Admitting: Family Medicine

## 2011-01-19 ENCOUNTER — Ambulatory Visit (INDEPENDENT_AMBULATORY_CARE_PROVIDER_SITE_OTHER): Payer: BC Managed Care – PPO | Admitting: Family Medicine

## 2011-01-19 ENCOUNTER — Encounter: Payer: Self-pay | Admitting: Family Medicine

## 2011-01-19 VITALS — BP 122/82 | HR 74 | Temp 98.3°F | Wt 193.1 lb

## 2011-01-19 DIAGNOSIS — J029 Acute pharyngitis, unspecified: Secondary | ICD-10-CM | POA: Insufficient documentation

## 2011-01-19 LAB — POCT RAPID STREP A (OFFICE): Rapid Strep A Screen: NEGATIVE

## 2011-01-19 NOTE — Patient Instructions (Addendum)
I think you have viral upper respiratory infection, could be early sinus infection. Ibuprofen for throat inflammation. Push fluids and plenty of rest. Salt water gargles. Nasal saline irrigation to help drain sinuses. Suck on cold things like popsicles or warm things like herbal teas (whichever soothes the throat better). Return if fever >101.5, trouble opening/closing mouth, difficulty swallowing, or worsening. If going on into next week, call us with update.

## 2011-01-19 NOTE — Assessment & Plan Note (Addendum)
Viral URTI, vs early sinusitis.  Supportive care as outlined in pt instructions.  Red flags to return discussed.  Update [place] if sxs continued into next week for consideration of abx for bacterial sinusitis. Rapid strep neg.  Low centor but exposed so checked.

## 2011-01-19 NOTE — Progress Notes (Signed)
  Subjective:    Patient ID: Nicole Daniels, female    DOB: 1951-01-18, 60 y.o.   MRN: [id number]  HPI CC: ST  5 day h/o scratchy throat, sinus HA, low grade fever.  Tooth discomfort.  Ears feeling achey.  Mild cough.  + mild nausea.    On new medicine nortriptyline daily for headaches (stopped topamax).  Still getting migraines.  No chills.  No abd pain, v/d, rashes, not significant PNDrip.  No congestion.    Has been around contacts with strep throat.  Son and husband smoke outside.  No h/o asthma.  Review of Systems Per HPI    Objective:   Physical Exam  Nursing note and vitals reviewed. Constitutional: She appears well-developed and well-nourished.  HENT:  Head: Normocephalic and atraumatic.  Right Ear: External ear normal.  Left Ear: External ear normal.  Nose: Nose normal.  Mouth/Throat: Oropharynx is clear and moist.  Eyes: Conjunctivae and EOM are normal. Pupils are equal, round, and reactive to light.  Neck: Normal range of motion. Neck supple. No thyromegaly present.  Cardiovascular: Normal rate, regular rhythm and normal heart sounds.   No murmur heard. Pulmonary/Chest: Effort normal and breath sounds normal.  Lymphadenopathy:    She has no cervical adenopathy.   nontender sinuses.         Assessment & Plan:

## 2011-01-26 ENCOUNTER — Other Ambulatory Visit (INDEPENDENT_AMBULATORY_CARE_PROVIDER_SITE_OTHER): Payer: BC Managed Care – PPO | Admitting: *Deleted

## 2011-01-26 DIAGNOSIS — Z79899 Other long term (current) drug therapy: Secondary | ICD-10-CM

## 2011-01-26 DIAGNOSIS — E7800 Pure hypercholesterolemia, unspecified: Secondary | ICD-10-CM

## 2011-01-26 LAB — LIPID PANEL
Cholesterol: 262 mg/dL — ABNORMAL HIGH (ref 0–200)
HDL: 45.7 mg/dL (ref >39.00)
Triglycerides: 299 mg/dL — ABNORMAL HIGH (ref 0.0–149.0)
VLDL: 59.8 mg/dL — ABNORMAL HIGH (ref 0.0–40.0)

## 2011-01-26 LAB — LDL CHOLESTEROL, DIRECT: Direct LDL: 174.3 mg/dL

## 2011-01-26 LAB — HEPATIC FUNCTION PANEL
Albumin: 4.1 g/dL (ref 3.5–5.2)
Total Protein: 7.4 g/dL (ref 6.0–8.3)

## 2011-01-27 ENCOUNTER — Telehealth: Payer: Self-pay | Admitting: *Deleted

## 2011-01-27 DIAGNOSIS — E785 Hyperlipidemia, unspecified: Secondary | ICD-10-CM

## 2011-01-27 MED ORDER — ATORVASTATIN CALCIUM 40 MG PO TABS: 40.0000 mg | ORAL_TABLET | Freq: Every day | ORAL | Status: DC

## 2011-01-27 NOTE — Telephone Encounter (Signed)
LDL is very high. Did she actually start simvastatin? If so, she needs to take atorvastatin 40 or rosuvastatin 20 with lipids/LFTs in 2 months. Pt not taking Simvastatin due to muscle pain in her legs. She agreed to start Atorvastatin 40mg  daily. She will return for fasting L/L profile 03/24/11

## 2011-01-27 NOTE — Telephone Encounter (Signed)
Message copied by Katina Dung on Fri Jan 27, 2011  4:06 PM ------      Message from: [facility], [place], Freida Busman      Created: Fri Jan 27, 2011  8:48 AM       LDL is very high. Did she actually start simvastatin? If so, she needs to take atorvastatin 40 or rosuvastatin 20 with lipids/LFTs in 2 months.

## 2011-03-14 NOTE — Discharge Summary (Signed)
NAME:  Nicole Daniels, Nicole Daniels          ACCOUNT NO.:  [id number]   MEDICAL RECORD NO.:  [id number]          PATIENT TYPE:  OBV   LOCATION:  3731                         FACILITY:  [facility]   PHYSICIAN:  Valerie A. Felicity Coyer, MDDATE OF BIRTH:  06-03-1951   DATE OF ADMISSION:  07/22/2008  DATE OF DISCHARGE:  07/23/2008                               DISCHARGE SUMMARY   DISCHARGE DIAGNOSES:  1. Atypical chest pain.  2. Dyslipidemia.  3. Fatigue.  4. Hypokalemia.  5. Sinus pressure.   HISTORY OF PRESENT ILLNESS:  Ms. Nicole Daniels is a 60 year old female and  in July 22, 2008, with multiple complaints, not feeling well for 2  weeks and had developed chest pain on the day of admission.  She also  noted feeling nausea and dizziness with lightheadedness.  She then  developed progressive chest pain and back pain.  Pain was sharp in  nature and relieved with nitroglycerin in the emergency department.  She  noted bilateral lower extremity swelling.  She was admitted for further  evaluation and treatment.   PAST MEDICAL HISTORY:  1. History of patent foramen ovale.  2. Status post cholecystectomy.  3. Hysterectomy.  4. Tonsillectomy.   COURSE OF HOSPITALIZATION:  1. Atypical chest pain.  The patient was admitted, underwent serial      cardiac enzymes which were negative.  She was maintained on      telemetry and maintained normal sinus rhythm.  D-dimer was      performed which was within normal limits.  Chest x-ray showed no      active disease.  She was noted to have elevated cholesterol with a      total cholesterol of 233 and LDL of 151, and HDL of 28.  As a      result, she was offered to be started on a statin, which she has      declined at this time which was first to pursue diet will defer      further management.  The patient is with primary care.   At this time, we have asked [facility] to arrange an outpatient  stress test.  The patient was instructed to return to the ER  should she  develop worsening chest pain or shortness of breath.   MEDICATIONS AT TIME OF DISCHARGE:  1. Aspirin 81 mg p.o. daily.  2. Ibuprofen as needed.  3. Flonase 1 spray each nostril daily.  4. Claritin 10 mg p.o. daily.   PERTINENT LABORATORIES AT TIME OF DISCHARGE:  Serial cardiac enzymes  negative.  TSH 4.792, total cholesterol 233, LDL 151, BUN 7, creatinine  0.87, hemoglobin 14, hematocrit 41.5.   FOLLOW UP:  The patient is to follow up with Billie Bean in 2 weeks and  contact the office for an appointment as well as followup for an  outpatient Cardiolite.  She will need followup of her pending blood work  at this appointment with Everrett Coombe, which include at this time in a  vitamin D.  Greater than 30 minutes was spent on discharge planning.      Sandford Craze, NP  Valerie A. Felicity Coyer, MD  Electronically Signed    MO/MEDQ  D:  07/23/2008  T:  07/24/2008  Job:  [id number]   cc:   Billie D. Bean, FNP

## 2011-03-17 ENCOUNTER — Ambulatory Visit (INDEPENDENT_AMBULATORY_CARE_PROVIDER_SITE_OTHER): Payer: BC Managed Care – PPO | Admitting: Family Medicine

## 2011-03-17 ENCOUNTER — Encounter: Payer: Self-pay | Admitting: Family Medicine

## 2011-03-17 VITALS — BP 130/84 | HR 68 | Temp 98.5°F | Wt 196.1 lb

## 2011-03-17 DIAGNOSIS — J029 Acute pharyngitis, unspecified: Secondary | ICD-10-CM

## 2011-03-17 MED ORDER — AMOXICILLIN-POT CLAVULANATE 875-125 MG PO TABS: 1.0000 | ORAL_TABLET | Freq: Two times a day (BID) | ORAL | Status: AC

## 2011-03-17 NOTE — Progress Notes (Signed)
Subjective:     Nicole Daniels is a 60 y.o. female who presents for evaluation of sore throat. No associated symptoms. Was eating on Wednesday and felt like something was stuck in throat, so she srcaped the food out with her finger.  Since then, left side of her throat is very sore.  Painful to swallow.  No fevers or chills.  No associated symptoms.  No difficulty swallowing.  The PMH, PSH, Social History, Family History, Medications, and allergies have been reviewed in [facility], and have been updated if relevant.   Review of Systems Pertinent items are noted in HPI.    Objective:    BP 130/84  Pulse 68  Temp(Src) 98.5 F (36.9 C) (Oral)  Wt 196 lb 1.9 oz (88.959 kg)  General Appearance:    Alert, cooperative, no distress, appears stated age  Head:    Normocephalic, without obvious abnormality, atraumatic  Eyes:    PERRL, conjunctiva/corneas clear, EOM's intact, fundi    benign, both eyes  Ears:    Normal TM's and external ear canals, both ears  Nose:   Nares normal, septum midline, mucosa normal, no drainage    or sinus tenderness  Throat:   Lips, mucosa, and tongue normal; teeth and gums normal Mild erythema left side of throat, no exudate.  Skin:   Skin color, texture, turgor normal, no rashes or lesions    Laboratory Strep test done. Results:negative.    Assessment:   Abrasion of throat.    Plan:    Patient placed on antibiotics. Use of OTC analgesics recommended as well as salt water gargles.

## 2011-03-24 ENCOUNTER — Other Ambulatory Visit: Payer: BC Managed Care – PPO | Admitting: *Deleted

## 2011-03-28 ENCOUNTER — Other Ambulatory Visit (INDEPENDENT_AMBULATORY_CARE_PROVIDER_SITE_OTHER): Payer: BC Managed Care – PPO | Admitting: *Deleted

## 2011-03-28 ENCOUNTER — Other Ambulatory Visit: Payer: BC Managed Care – PPO | Admitting: *Deleted

## 2011-03-28 DIAGNOSIS — E7800 Pure hypercholesterolemia, unspecified: Secondary | ICD-10-CM

## 2011-03-28 DIAGNOSIS — Z79899 Other long term (current) drug therapy: Secondary | ICD-10-CM

## 2011-03-28 LAB — HEPATIC FUNCTION PANEL
ALT: 20 U/L (ref 0–35)
AST: 22 U/L (ref 0–37)
Alkaline Phosphatase: 90 U/L (ref 39–117)
Bilirubin, Direct: 0.1 mg/dL (ref 0.0–0.3)
Total Protein: 6.8 g/dL (ref 6.0–8.3)

## 2011-03-28 LAB — LIPID PANEL: HDL: 47.4 mg/dL (ref >39.00)

## 2011-03-28 LAB — LDL CHOLESTEROL, DIRECT: Direct LDL: 150 mg/dL

## 2011-03-29 ENCOUNTER — Other Ambulatory Visit: Payer: BC Managed Care – PPO | Admitting: *Deleted

## 2011-04-20 ENCOUNTER — Ambulatory Visit: Payer: BC Managed Care – PPO | Admitting: Internal Medicine

## 2011-07-13 ENCOUNTER — Ambulatory Visit (INDEPENDENT_AMBULATORY_CARE_PROVIDER_SITE_OTHER): Payer: BC Managed Care – PPO | Admitting: Family Medicine

## 2011-07-13 ENCOUNTER — Encounter: Payer: Self-pay | Admitting: Family Medicine

## 2011-07-13 VITALS — BP 150/90 | HR 80 | Temp 98.5°F | Wt 197.0 lb

## 2011-07-13 DIAGNOSIS — I10 Essential (primary) hypertension: Secondary | ICD-10-CM

## 2011-07-13 DIAGNOSIS — N950 Postmenopausal bleeding: Secondary | ICD-10-CM

## 2011-07-13 LAB — POCT URINALYSIS DIPSTICK
Bilirubin, UA: NEGATIVE
Ketones, UA: NEGATIVE
Spec Grav, UA: 1.005
pH, UA: 6

## 2011-07-13 MED ORDER — CIPROFLOXACIN HCL 250 MG PO TABS: 250.0000 mg | ORAL_TABLET | Freq: Two times a day (BID) | ORAL | Status: AC

## 2011-07-13 MED ORDER — HYDROCORTISONE ACETATE 25 MG RE SUPP: 25.0000 mg | Freq: Two times a day (BID) | RECTAL | Status: AC

## 2011-07-13 NOTE — Patient Instructions (Signed)
Good to see you. Please stop by to see Marion on your way out. 

## 2011-07-13 NOTE — Progress Notes (Signed)
  Subjective:    Patient ID: Nicole Daniels, female    DOB: [birth year], 60 y.o.   MRN: [id number]  HPI  Postmenopausal Bleeding Patient complains of vaginal bleeding.  She is s/p total vaginal hysterectomy, right oophrectomy for endometriosis. Past few weeks, low abdominal cramping, then noted some vaginal bleeding. Bleeding is described as spotting and has occurred 2 times. Other menopausal symptoms include: none. Workup to date: none. No dysuria.    HTN- BP Readings from Last 3 Encounters:  07/13/11 150/90  03/17/11 130/84  01/19/11 122/82   Has been taking BP medication regularly. BP elevated today but she is worried about this vaginal bleeding.   No CP, HA, or blurred vision.  Review of Systems See HPI    Objective:   Physical Exam BP 150/90  Pulse 80  Temp(Src) 98.5 F (36.9 C) (Oral)  Wt 197 lb (89.359 kg)  General:  Well-developed,well-nourished,in no acute distress; alert,appropriate and cooperative throughout examination Head:  normocephalic and atraumatic.   Eyes:  vision grossly intact, pupils equal, pupils round, and pupils reactive to light.   Ears:  R ear normal and L ear normal.   Nose:  no external deformity.   Mouth:  good dentition.   Neck:  No deformities, masses, or tenderness noted. Breasts:  No mass, nodules, thickening, tenderness, bulging, retraction, inflamation, nipple discharge or skin changes noted.   Lungs:  Normal respiratory effort, chest expands symmetrically. Lungs are clear to auscultation, no crackles or wheezes. Heart:  Normal rate and regular rhythm. S1 and S2 normal without gallop, murmur, click, rub or other extra sounds. Abdomen: No CVA tenderness,  Bowel sounds positive,abdomen soft and non-tender without masses, organomegaly or hernias noted. Rectal:  no external abnormalities.   Genitalia:  Pelvic Exam:        External: normal female genitalia without lesions or masses        Vagina: normal without lesions or masses  Cervix: absent        Adnexa: normal bimanual exam without masses or fullness        Uterus: absent Psych:  Cognition and judgment appear intact. Alert and cooperative with normal attention span and concentration. No apparent delusions, illusions, hallucinations    Assessment & Plan:    1. Post-menopausal bleeding   New.  Pelvic exam unremarkable and UA pos for UTI. ?if bleeding was actually coming from urethra and not vagina. S/p hysterectomy, so cannot perform endometrial biopsy. Treat for UTI with cipro, order Ultrasound of pelvis. US Pelvis Complete, POCT urinalysis dipstick, Urine Culture  2. HYPERTENSION, UNSPECIFIED   Deteriorated.  Likely because she is nervous. Pt to check BP at home and call us next week with update.

## 2011-07-14 ENCOUNTER — Ambulatory Visit
Admission: RE | Admit: 2011-07-14 | Discharge: 2011-07-14 | Disposition: A | Discharge status: Home / Self Care | Payer: BC Managed Care – PPO | Source: Ambulatory Visit | Attending: Family Medicine | Admitting: Family Medicine

## 2011-07-14 DIAGNOSIS — N950 Postmenopausal bleeding: Secondary | ICD-10-CM

## 2011-07-16 ENCOUNTER — Other Ambulatory Visit: Payer: Self-pay | Admitting: Cardiology

## 2011-07-20 ENCOUNTER — Ambulatory Visit: Payer: BC Managed Care – PPO

## 2011-07-20 ENCOUNTER — Encounter: Payer: Self-pay | Admitting: *Deleted

## 2011-07-20 ENCOUNTER — Other Ambulatory Visit: Payer: Self-pay | Admitting: Family Medicine

## 2011-07-20 DIAGNOSIS — I10 Essential (primary) hypertension: Secondary | ICD-10-CM

## 2011-07-20 DIAGNOSIS — R319 Hematuria, unspecified: Secondary | ICD-10-CM

## 2011-07-20 LAB — POCT URINALYSIS DIPSTICK: Ketones, UA: NEGATIVE

## 2011-07-20 NOTE — Progress Notes (Signed)
There was blood, trace protein, and leukocytes in her urine.  She stated that she has no symptoms of a UTI.  No frequency, no burning, no back pain etc.

## 2011-07-20 NOTE — Progress Notes (Signed)
Patient came in today for a BP check and to leave a urine sample.  She stated that she still gets lightheaded from time to time and she feels so so.  She is not having any urinary symptoms at this time but she is leaving a sample to make sure the blood has cleared from her last urinalysis.  Uses [facility].

## 2011-07-24 ENCOUNTER — Other Ambulatory Visit: Payer: Self-pay | Admitting: Family Medicine

## 2011-07-31 LAB — COMPREHENSIVE METABOLIC PANEL
ALT: 25
AST: 26
Albumin: 3.7
Alkaline Phosphatase: 83
CO2: 25
Chloride: 105
Creatinine, Ser: 0.73
GFR calc Af Amer: >60
GFR calc non Af Amer: >60
Potassium: 3 — ABNORMAL LOW
Sodium: 141
Total Bilirubin: 0.7

## 2011-07-31 LAB — CK TOTAL AND CKMB (NOT AT ARMC)
CK, MB: 1.1
Relative Index: INVALID

## 2011-07-31 LAB — VITAMIN B12: Vitamin B-12: 600 (ref 211–911)

## 2011-07-31 LAB — CBC
Platelets: 400
WBC: 6.4

## 2011-07-31 LAB — POCT CARDIAC MARKERS
CKMB, poc: <1 — ABNORMAL LOW
Myoglobin, poc: 68.8

## 2011-07-31 LAB — BASIC METABOLIC PANEL
BUN: 7
Calcium: 9.1
Creatinine, Ser: 0.87
GFR calc non Af Amer: >60
Glucose, Bld: 103 — ABNORMAL HIGH

## 2011-07-31 LAB — LIPID PANEL
Cholesterol: 233 — ABNORMAL HIGH
HDL: 28 — ABNORMAL LOW
LDL Cholesterol: 151 — ABNORMAL HIGH
Total CHOL/HDL Ratio: 8.3

## 2011-07-31 LAB — B-NATRIURETIC PEPTIDE (CONVERTED LAB): Pro B Natriuretic peptide (BNP): 50

## 2011-07-31 LAB — PROTIME-INR: INR: 0.9

## 2011-07-31 LAB — TROPONIN I: Troponin I: 0.01

## 2011-08-23 ENCOUNTER — Ambulatory Visit: Payer: BC Managed Care – PPO | Admitting: Physician Assistant

## 2011-08-23 ENCOUNTER — Ambulatory Visit (INDEPENDENT_AMBULATORY_CARE_PROVIDER_SITE_OTHER): Payer: BC Managed Care – PPO | Admitting: Nurse Practitioner

## 2011-08-23 ENCOUNTER — Encounter: Payer: Self-pay | Admitting: Nurse Practitioner

## 2011-08-23 ENCOUNTER — Telehealth: Payer: Self-pay | Admitting: *Deleted

## 2011-08-23 VITALS — BP 160/90 | HR 78 | Ht 65.0 in | Wt 198.1 lb

## 2011-08-23 DIAGNOSIS — R4781 Slurred speech: Secondary | ICD-10-CM

## 2011-08-23 DIAGNOSIS — I10 Essential (primary) hypertension: Secondary | ICD-10-CM

## 2011-08-23 DIAGNOSIS — R4789 Other speech disturbances: Secondary | ICD-10-CM

## 2011-08-23 MED ORDER — HYDROCHLOROTHIAZIDE 25 MG PO TABS: 25.0000 mg | ORAL_TABLET | Freq: Every day | ORAL | Status: DC

## 2011-08-23 NOTE — Assessment & Plan Note (Addendum)
Blood pressure is elevated. I suspect most of her symptoms are related to her blood pressure. We will check a BMET in the morning. I have started her on HCTZ 25 mg daily. I do worry about the episodes of slurred speech. We will check a CT of her head in the morning. If she has any worsening of symptoms she is to go to the ER, otherwise I will see her back on Monday. She was subsequently seen with Dr. Johney Frame and he is in agreement. Patient is agreeable to this plan and will call if any problems develop in the interim.

## 2011-08-23 NOTE — Telephone Encounter (Signed)
Pt called stating that her BP has been elevated, she has not felt well for a couple of weeks.  Gets short of breath with walking, has had jaw pain and neck stiffness for 3 or 4 days. Per Dr. Patsy Lager, advised pt that she needs to be seen somewhere today, either by cardio or ER.  Advised her to call her cardiologist to see what they suggest she do.  If she doesn't get any information from them I advised her to call back here.  Pt agreed.

## 2011-08-23 NOTE — Progress Notes (Signed)
Nicole Daniels Date of Birth: 10-31-1950 Medical Record #[id number]  History of Present Illness: Nicole Daniels is seen today as a work in visit. She is seen for Dr. Shirlee Latch. She has had trouble with her blood pressure. She has been trying to check it occasionally at the drug store. She does not have her own cuff. She has been under more stress and more anxious. Yesterday she went to the drug store and had a grossly elevated blood pressure at 180/80. She rechecked it and it was 174/97. She called her PCP and they advised her to come here or go to the ER. Today she feels ok but this past weekend she had some slurred speech and felt like things were crawling on her right leg. She did have some jaw pain over the last 2 days prior that is resolved. That occurred at rest. No exertional symptoms No chest pain.  She had a negative myoview back in February of this year. Atenolol was started after that visit. She is no longer on her cholesterol medicine. She said that made her feel bad. She does not know what her levels are currently. She is taking lots of Ibuprofen.   Current Outpatient Prescriptions on File Prior to Visit  Medication Sig Dispense Refill  . aspirin 81 MG tablet Take 81 mg by mouth daily.        Marland Kitchen atenolol (TENORMIN) 25 MG tablet TAKE 1 TABLET BY MOUTH EVERY DAY  30 tablet  6  . ibuprofen (ADVIL,MOTRIN) 200 MG tablet Take 200 mg by mouth every 6 (six) hours as needed.        . nortriptyline (PAMELOR) 10 MG capsule Take 20 mg by mouth at bedtime.       . hydrochlorothiazide (HYDRODIURIL) 25 MG tablet Take 1 tablet (25 mg total) by mouth daily.  30 tablet  6    Allergies  Allergen Reactions  . Simvastatin Other (See Comments)    Leg pain    Past Medical History  Diagnosis Date  . Chest pain, unspecified   . Depressive disorder, not elsewhere classified   . Dizziness and giddiness   . Dysfunction of eustachian tube   . Other malaise and fatigue   . Other and unspecified  hyperlipidemia   . Unspecified essential hypertension   . Migraine, unspecified, without mention of intractable migraine without mention of status migrainosus   . Obesity, unspecified   . Neoplasm of skin of face     malignant  . Palpitations   . Ostium secundum type atrial septal defect   . Acquired cyst of kidney   . Abdominal pain, right lower quadrant   . Shortness of breath   . Acute sinusitis, unspecified   . Unspecified sinusitis (chronic)   . Special screening for malignant neoplasms, colon   . Special screening for malignant neoplasms of other sites   . Unspecified vitamin D deficiency   . Routine general medical examination at a health care facility   . OA (osteoarthritis)     knee    Past Surgical History  Procedure Date  . Partial hysterectomy     right ovary remains    History  Smoking status  . Never Smoker   Smokeless tobacco  . Not on file    History  Alcohol Use No    Family History  Problem Relation Age of Onset  . Cancer Cousin     breast  . Cirrhosis Father   . Hypertension Mother   .  Hypertension Sister     Review of Systems: The review of systems is per the HPI.  She does have some occasional swelling of her feet. Salt use is questionable. All other systems were reviewed and are negative.  Physical Exam: BP 160/90  Pulse 78  Ht 5\' 5"  (1.651 m)  Wt 198 lb 1.9 oz (89.867 kg)  BMI 32.97 kg/m2 Patient is very pleasant and in no acute distress. She is obese. Skin is warm and dry. Color is normal.  HEENT is unremarkable. Normocephalic/atraumatic. PERRL. Sclera are nonicteric. Neck is supple. No bruits. No masses. No JVD. Lungs are clear. Cardiac exam shows a regular rate and rhythm. Abdomen is soft. Extremities are without edema. Gait and ROM are intact. No gross neurologic deficits noted. Speech is clear today.  LABORATORY DATA: EKG is normal. Tracing is reviewed with Dr. Johney Frame.   Assessment / Plan:

## 2011-08-23 NOTE — Patient Instructions (Addendum)
Please monitor your blood pressure at home and keep a diary.  I would advise you to get a blood pressure cuff at home - get an OMRON brand  We are going to put you on HCTZ 25 mg for your blood pressure. Take first dose tonight and then one each morning.  I will see you back on Monday.  We will get a CT of your head in the morning to evaluate your slurred speech.  If you have any worsening or change in symptoms, go to the ER.

## 2011-08-24 ENCOUNTER — Other Ambulatory Visit (INDEPENDENT_AMBULATORY_CARE_PROVIDER_SITE_OTHER): Payer: BC Managed Care – PPO | Admitting: *Deleted

## 2011-08-24 ENCOUNTER — Ambulatory Visit (INDEPENDENT_AMBULATORY_CARE_PROVIDER_SITE_OTHER)
Admission: RE | Admit: 2011-08-24 | Discharge: 2011-08-24 | Disposition: A | Discharge status: Home / Self Care | Payer: BC Managed Care – PPO | Source: Ambulatory Visit | Attending: Nurse Practitioner | Admitting: Nurse Practitioner

## 2011-08-24 DIAGNOSIS — R4789 Other speech disturbances: Secondary | ICD-10-CM

## 2011-08-24 DIAGNOSIS — I10 Essential (primary) hypertension: Secondary | ICD-10-CM

## 2011-08-24 DIAGNOSIS — R4781 Slurred speech: Secondary | ICD-10-CM

## 2011-08-24 LAB — BASIC METABOLIC PANEL
BUN: 18 mg/dL (ref 6–23)
CO2: 26 mEq/L (ref 19–32)
Calcium: 9.4 mg/dL (ref 8.4–10.5)
Chloride: 104 mEq/L (ref 96–112)
Creatinine, Ser: 0.9 mg/dL (ref 0.4–1.2)
GFR: 72.15 mL/min (ref >60.00)
Glucose, Bld: 95 mg/dL (ref 70–99)
Potassium: 3.6 mEq/L (ref 3.5–5.1)
Sodium: 140 mEq/L (ref 135–145)

## 2011-08-25 ENCOUNTER — Telehealth: Payer: Self-pay | Admitting: Nurse Practitioner

## 2011-08-25 NOTE — Telephone Encounter (Signed)
Please have pt make appt with new doctor to establish and get health plan organized.

## 2011-08-25 NOTE — Telephone Encounter (Signed)
Dr. Johney Frame reviewed CT scan and pt is aware of the results showing no signs of a stroke but it does show some chronic changes.  Dr. Johney Frame recommended that pt follow-up with her  Pcp. Pt is aware of this and a copy was e-sent to Dr. Hetty Ely and Norma Fredrickson NP. Mylo Red RN

## 2011-08-25 NOTE — Telephone Encounter (Signed)
Pt would like CT results

## 2011-08-28 ENCOUNTER — Ambulatory Visit: Payer: BC Managed Care – PPO | Admitting: Family Medicine

## 2011-08-28 ENCOUNTER — Telehealth: Payer: Self-pay | Admitting: *Deleted

## 2011-08-28 DIAGNOSIS — I10 Essential (primary) hypertension: Secondary | ICD-10-CM

## 2011-08-28 NOTE — Telephone Encounter (Signed)
She should try to check her blood pressure. It was up in the 190 systolic range last week before I saw her.  Thanks Shaliyah Taite

## 2011-08-28 NOTE — Telephone Encounter (Signed)
Pt still has "bad headache" from the back of her neck to the base of her head.  She has called her neurologist office,Dr. Anne Hahn, for an appt. I have e-sent a copy of the CT to Dr. Anne Hahn.  She will await appt with him at this time. She will return this Wednesday for repeat BMET. Pt did start HCTZ last Wednesday. Mylo Red RN

## 2011-08-28 NOTE — Telephone Encounter (Signed)
No she didn't say anything about her blood pressure.  She was only concerned b/c her headache medications were not working as well. She has not been able to work b/c of this. She works for [employer]. Nicole Daniels

## 2011-08-28 NOTE — Telephone Encounter (Signed)
Thanks. Did she say if her blood pressure was better?  I do agree with seeing Dr. Anne Hahn.   Lawson Fiscal

## 2011-08-28 NOTE — Telephone Encounter (Signed)
Detailed message left on voicemail re: checking blood pressure & bring in when she returns for lab work. Mylo Red RN

## 2011-08-29 NOTE — Telephone Encounter (Addendum)
Patient notified as instructed by telephone. Was informed by patient that she has been seeing Dr. Dayton Martes. Advised patient that she needs to set up an appointment with Dr. Dayton Martes as instructed. Patient stated that she has an appointment scheduled to see a neurologist next week and will call back to schedule an appointment with Dr. Dayton Martes after that appointment.

## 2011-08-30 ENCOUNTER — Encounter: Payer: Self-pay | Admitting: *Deleted

## 2011-08-30 ENCOUNTER — Telehealth: Payer: Self-pay | Admitting: Cardiology

## 2011-08-30 ENCOUNTER — Ambulatory Visit (INDEPENDENT_AMBULATORY_CARE_PROVIDER_SITE_OTHER): Payer: BC Managed Care – PPO | Admitting: *Deleted

## 2011-08-30 DIAGNOSIS — I10 Essential (primary) hypertension: Secondary | ICD-10-CM

## 2011-08-30 LAB — BASIC METABOLIC PANEL
BUN: 16 mg/dL (ref 6–23)
CO2: 23 mEq/L (ref 19–32)
Calcium: 9.1 mg/dL (ref 8.4–10.5)
Chloride: 105 mEq/L (ref 96–112)
Creatinine, Ser: 1 mg/dL (ref 0.4–1.2)
GFR: 62.7 mL/min (ref >60.00)
Glucose, Bld: 130 mg/dL — ABNORMAL HIGH (ref 70–99)
Potassium: 3.2 mEq/L — ABNORMAL LOW (ref 3.5–5.1)
Sodium: 139 mEq/L (ref 135–145)

## 2011-08-30 MED ORDER — POTASSIUM CHLORIDE ER 10 MEQ PO TBCR: 10.0000 meq | EXTENDED_RELEASE_TABLET | ORAL | Status: DC

## 2011-08-30 NOTE — Progress Notes (Signed)
N/A.  LMTC. 

## 2011-08-30 NOTE — Telephone Encounter (Signed)
Message given to pt to start KDur and recheck BMET in two weeks.  Appt scheduled, med and lab ordered.

## 2011-08-30 NOTE — Telephone Encounter (Signed)
New message   Pt calling about test results  Please call back

## 2011-09-13 ENCOUNTER — Other Ambulatory Visit: Payer: Self-pay

## 2011-09-13 ENCOUNTER — Other Ambulatory Visit (INDEPENDENT_AMBULATORY_CARE_PROVIDER_SITE_OTHER): Payer: BC Managed Care – PPO | Admitting: *Deleted

## 2011-09-13 DIAGNOSIS — I10 Essential (primary) hypertension: Secondary | ICD-10-CM

## 2011-09-13 DIAGNOSIS — E876 Hypokalemia: Secondary | ICD-10-CM

## 2011-09-13 LAB — BASIC METABOLIC PANEL
BUN: 15 mg/dL (ref 6–23)
CO2: 28 mEq/L (ref 19–32)
Calcium: 9.6 mg/dL (ref 8.4–10.5)
Chloride: 102 mEq/L (ref 96–112)
Creatinine, Ser: 0.9 mg/dL (ref 0.4–1.2)
GFR: 72.14 mL/min (ref >60.00)
Glucose, Bld: 124 mg/dL — ABNORMAL HIGH (ref 70–99)
Potassium: 2.8 mEq/L — CL (ref 3.5–5.1)
Sodium: 140 mEq/L (ref 135–145)

## 2011-09-13 MED ORDER — POTASSIUM CHLORIDE ER 10 MEQ PO TBCR: 20.0000 meq | EXTENDED_RELEASE_TABLET | ORAL | Status: DC

## 2011-09-27 ENCOUNTER — Other Ambulatory Visit (INDEPENDENT_AMBULATORY_CARE_PROVIDER_SITE_OTHER): Payer: BC Managed Care – PPO | Admitting: *Deleted

## 2011-09-27 DIAGNOSIS — E876 Hypokalemia: Secondary | ICD-10-CM

## 2011-09-28 LAB — BASIC METABOLIC PANEL
BUN: 12 mg/dL (ref 6–23)
CO2: 28 mEq/L (ref 19–32)
Calcium: 9.2 mg/dL (ref 8.4–10.5)
Chloride: 107 mEq/L (ref 96–112)
Creatinine, Ser: 0.9 mg/dL (ref 0.4–1.2)
GFR: 64.22 mL/min (ref >60.00)
Glucose, Bld: 97 mg/dL (ref 70–99)
Potassium: 3.8 mEq/L (ref 3.5–5.1)
Sodium: 141 mEq/L (ref 135–145)

## 2011-11-27 ENCOUNTER — Telehealth: Payer: Self-pay | Admitting: Family Medicine

## 2011-11-27 NOTE — Telephone Encounter (Signed)
Triage Record Num: [id number] Operator: Craig Guess Patient Name: Nicole Daniels Call Date & Time: 11/27/2011 2:21:40PM Patient Phone: [phone] PCP: Ruthe Mannan Patient Gender: Female PCP Fax : [phone] Patient DOB: [birth year] Practice Name: [facility] Reason for Call: Caller: [place]/Patient; PCP: Ruthe Mannan [facility]); CB#: [phone]. Call regarding Cramping, Nausea, Lightheaded. Backup Number [phone]. RN attempted to reach caller at both callback #'s. Co-worker transferred me to another #. No answer at any of the 3 #'s. RN left message to callback prn. Protocol(s) Used: No Contact or Duplicate Contact Calls (Adult) Recommended Outcome per Protocol: No Contact Reason for Outcome: Message left on identified answering machine Care Advice: ~ 11/27/2011 2:24:46PM Page 1 of 1 [email or web address]

## 2011-11-28 ENCOUNTER — Encounter: Payer: Self-pay | Admitting: Family Medicine

## 2011-11-28 ENCOUNTER — Ambulatory Visit (INDEPENDENT_AMBULATORY_CARE_PROVIDER_SITE_OTHER): Payer: BC Managed Care – PPO | Admitting: Family Medicine

## 2011-11-28 VITALS — BP 120/84 | HR 72 | Temp 98.3°F | Ht 65.0 in | Wt 196.8 lb

## 2011-11-28 DIAGNOSIS — R42 Dizziness and giddiness: Secondary | ICD-10-CM

## 2011-11-28 DIAGNOSIS — J019 Acute sinusitis, unspecified: Secondary | ICD-10-CM

## 2011-11-28 MED ORDER — AMOXICILLIN-POT CLAVULANATE 875-125 MG PO TABS: 1.0000 | ORAL_TABLET | Freq: Two times a day (BID) | ORAL | Status: AC

## 2011-11-28 MED ORDER — MECLIZINE HCL 12.5 MG PO TABS: 12.5000 mg | ORAL_TABLET | Freq: Three times a day (TID) | ORAL | Status: AC | PRN

## 2011-11-28 NOTE — Progress Notes (Signed)
Subjective:    Patient ID: Nicole Daniels, female    DOB: 20-Oct-1951, 61 y.o.   MRN: [id number]  HPI 61 yo here for dizziness and sinus pressure. Has had URI symptoms- cough, runny nose, sinus pressure for over a week. Past few days, room is spinning when she changes her head position too quickly, she also gets a little nauseated. No vomiting. No fevers.  Has had vertigo in past and this seems very similar.  Patient Active Problem List  Diagnoses  . NEOPLASM, MALIGNANT, SKIN, FACE  . UNSPECIFIED VITAMIN D DEFICIENCY  . HYPERLIPIDEMIA  . OBESITY  . DEPRESSION, MILD  . EUSTACHIAN TUBE DYSFUNCTION  . SINUSITIS - ACUTE-NOS  . SINUSITIS, CHRONIC  . RENAL CYST  . PATENT FORAMEN OVALE  . DIZZINESS  . FATIGUE  . PALPITATIONS  . RLQ PAIN  . NEOPLASM, MALIGNANT, SKIN, FACE  . MIGRAINE, CHRONIC  . SHORTNESS OF BREATH  . CHEST PAIN  . HYPERTENSION, UNSPECIFIED  . Sore throat  . Post-menopausal bleeding  . Hematuria   Past Medical History  Diagnosis Date  . Chest pain, unspecified   . Depressive disorder, not elsewhere classified   . Dizziness and giddiness   . Dysfunction of eustachian tube   . Other malaise and fatigue   . Other and unspecified hyperlipidemia   . Unspecified essential hypertension   . Migraine, unspecified, without mention of intractable migraine without mention of status migrainosus   . Obesity, unspecified   . Neoplasm of skin of face     malignant  . Palpitations   . Ostium secundum type atrial septal defect   . Acquired cyst of kidney   . Abdominal pain, right lower quadrant   . Shortness of breath   . Acute sinusitis, unspecified   . Unspecified sinusitis (chronic)   . Special screening for malignant neoplasms, colon   . Special screening for malignant neoplasms of other sites   . Unspecified vitamin D deficiency   . Routine general medical examination at a health care facility   . OA (osteoarthritis)     knee   Past Surgical History    Procedure Date  . Partial hysterectomy     right ovary remains   History  Substance Use Topics  . Smoking status: Never Smoker   . Smokeless tobacco: Not on file  . Alcohol Use: No   Family History  Problem Relation Age of Onset  . Cancer Cousin     breast  . Cirrhosis Father   . Hypertension Mother   . Hypertension Sister    Allergies  Allergen Reactions  . Simvastatin Other (See Comments)    Leg pain   Current Outpatient Prescriptions on File Prior to Visit  Medication Sig Dispense Refill  . aspirin 81 MG tablet Take 81 mg by mouth daily.        Marland Kitchen atenolol (TENORMIN) 25 MG tablet TAKE 1 TABLET BY MOUTH EVERY DAY  30 tablet  6  . hydrochlorothiazide (HYDRODIURIL) 25 MG tablet Take 1 tablet (25 mg total) by mouth daily.  30 tablet  6  . nortriptyline (PAMELOR) 10 MG capsule Take 20 mg by mouth at bedtime.        The PMH, PSH, Social History, Family History, Medications, and allergies have been reviewed in [facility], and have been updated if relevant.   Review of Systems Per HPI   No focal neurological symptoms Objective:   Physical Exam  BP 120/84  Pulse 72  Temp(Src) 98.3 F (  36.8 C) (Oral)  Ht 5\' 5"  (1.651 m)  Wt 196 lb 12.8 oz (89.268 kg)  BMI 32.75 kg/m2  SpO2 99%  Constitutional: She appears well-developed and well-nourished.  HENT:  Head: Normocephalic and atraumatic, no nyastagmus but she is dizzy with changes in head opsition. Right Ear: + erythematous TM with thick fluid Left Ear: +erythematous TM Nose: Nose normal.  Mouth/Throat: Oropharynx is clear and moist.  Eyes: Conjunctivae and EOM are normal. Pupils are equal, round, and reactive to light.  Neck: Normal range of motion. Neck supple. No thyromegaly present.  Cardiovascular: Normal rate, regular rhythm and normal heart sounds.   No murmur heard. Pulmonary/Chest: Effort normal and breath sounds normal.  Lymphadenopathy:    She has no cervical adenopathy.       Assessment & Plan:   1.  SINUSITIS - ACUTE-NOS  Given duration and progression of symptoms, will treat for bacterial sinusitis with Augmentin. See pt instructions for details.   2. Vertigo  Likely secondary to #1. Treat with Meclizine as needed.

## 2011-11-28 NOTE — Patient Instructions (Signed)
Take antibiotic as directed.  Drink lots of fluids.    Treat sympotmatically with Mucinex, nasal saline irrigation, and Tylenol/Ibuprofen.   You can use warm compresses.  Cough suppressant at night.   Call if not improving as expected in 5-7 days.    

## 2011-12-13 ENCOUNTER — Encounter: Payer: Self-pay | Admitting: Family Medicine

## 2011-12-13 ENCOUNTER — Ambulatory Visit (INDEPENDENT_AMBULATORY_CARE_PROVIDER_SITE_OTHER): Payer: BC Managed Care – PPO | Admitting: Family Medicine

## 2011-12-13 ENCOUNTER — Telehealth: Payer: Self-pay | Admitting: Family Medicine

## 2011-12-13 VITALS — BP 122/70 | HR 76 | Temp 98.1°F | Wt 197.5 lb

## 2011-12-13 DIAGNOSIS — J329 Chronic sinusitis, unspecified: Secondary | ICD-10-CM

## 2011-12-13 DIAGNOSIS — H81399 Other peripheral vertigo, unspecified ear: Secondary | ICD-10-CM

## 2011-12-13 MED ORDER — FLUTICASONE PROPIONATE 50 MCG/ACT NA SUSP: 2.0000 | Freq: Every day | NASAL | Status: DC

## 2011-12-13 MED ORDER — AZITHROMYCIN 250 MG PO TABS: ORAL_TABLET | ORAL | Status: AC

## 2011-12-13 NOTE — Progress Notes (Signed)
Subjective:    Patient ID: Nicole Daniels, female    DOB: [birth year], 61 y.o.   MRN: [id number]  HPI 61 yo here for follow up dizziness and sinus pressure.  Treated for sinusitis and Vertigo on 1/29 with 10 day course of Augmentin and meclizine. Dizziness has improved but sinus pressure is actually worsening.  Now her teeth hurt and she feels feverish.  No cough. No SOB.    Patient Active Problem List  Diagnoses  . NEOPLASM, MALIGNANT, SKIN, FACE  . UNSPECIFIED VITAMIN D DEFICIENCY  . HYPERLIPIDEMIA  . OBESITY  . DEPRESSION, MILD  . EUSTACHIAN TUBE DYSFUNCTION  . SINUSITIS - ACUTE-NOS  . SINUSITIS, CHRONIC  . RENAL CYST  . PATENT FORAMEN OVALE  . DIZZINESS  . FATIGUE  . PALPITATIONS  . RLQ PAIN  . NEOPLASM, MALIGNANT, SKIN, FACE  . MIGRAINE, CHRONIC  . SHORTNESS OF BREATH  . CHEST PAIN  . HYPERTENSION, UNSPECIFIED  . Sore throat  . Post-menopausal bleeding  . Hematuria  . Vertigo   Past Medical History  Diagnosis Date  . Chest pain, unspecified   . Depressive disorder, not elsewhere classified   . Dizziness and giddiness   . Dysfunction of eustachian tube   . Other malaise and fatigue   . Other and unspecified hyperlipidemia   . Unspecified essential hypertension   . Migraine, unspecified, without mention of intractable migraine without mention of status migrainosus   . Obesity, unspecified   . Neoplasm of skin of face     malignant  . Palpitations   . Ostium secundum type atrial septal defect   . Acquired cyst of kidney   . Abdominal pain, right lower quadrant   . Shortness of breath   . Acute sinusitis, unspecified   . Unspecified sinusitis (chronic)   . Special screening for malignant neoplasms, colon   . Special screening for malignant neoplasms of other sites   . Unspecified vitamin D deficiency   . Routine general medical examination at a health care facility   . OA (osteoarthritis)     knee   Past Surgical History  Procedure Date  .  Partial hysterectomy     right ovary remains   History  Substance Use Topics  . Smoking status: Never Smoker   . Smokeless tobacco: Not on file  . Alcohol Use: No   Family History  Problem Relation Age of Onset  . Cancer Cousin     breast  . Cirrhosis Father   . Hypertension Mother   . Hypertension Sister    Allergies  Allergen Reactions  . Simvastatin Other (See Comments)    Leg pain   Current Outpatient Prescriptions on File Prior to Visit  Medication Sig Dispense Refill  . amoxicillin-clavulanate (AUGMENTIN) 875-125 MG per tablet Take 1 tablet by mouth 2 (two) times daily.  20 tablet  0  . aspirin 81 MG tablet Take 81 mg by mouth daily.        Marland Kitchen atenolol (TENORMIN) 25 MG tablet TAKE 1 TABLET BY MOUTH EVERY DAY  30 tablet  6  . hydrochlorothiazide (HYDRODIURIL) 25 MG tablet Take 1 tablet (25 mg total) by mouth daily.  30 tablet  6  . nortriptyline (PAMELOR) 10 MG capsule Take 20 mg by mouth at bedtime.        The PMH, PSH, Social History, Family History, Medications, and allergies have been reviewed in [facility], and have been updated if relevant.   Review of Systems Per HPI  No focal neurological symptoms Objective:   Physical Exam  BP 122/70  Pulse 76  Temp(Src) 98.1 F (36.7 C) (Oral)  Wt 197 lb 8 oz (89.585 kg)  Constitutional: She appears well-developed and well-nourished.  HENT:  Head: Normocephalic and atraumatic, no nyastagmus but she is dizzy with changes in head opsition. Right Ear: + erythematous TM but improved Left Ear: +erythematous TM Nose: + nasal erythema, frontal sinuses TTP Mouth/Throat: Oropharynx is clear and moist.  Eyes: Conjunctivae and EOM are normal. Pupils are equal, round, and reactive to light.  Neck: Normal range of motion. Neck supple. No thyromegaly present.  Cardiovascular: Normal rate, regular rhythm and normal heart sounds.   No murmur heard. Pulmonary/Chest: Effort normal and breath sounds normal.  Lymphadenopathy:    She has  no cervical adenopathy.       Assessment & Plan:   1. SINUSITIS - ACUTE-NOS  Deteriorated. Will treat with Zpack and flonase. See pt instructions for details.   2. Vertigo  Likely secondary to #1. Improving. Treat with Meclizine as needed.

## 2011-12-13 NOTE — Patient Instructions (Signed)
Good to see you. Please take Zpack as directed. Let's start using flonase for at least the next week or two. Call me if your symptoms are not improving.

## 2011-12-13 NOTE — Telephone Encounter (Signed)
Note written and faxed to patient and the number listed.

## 2011-12-13 NOTE — Telephone Encounter (Signed)
Ok to write note as requested below.

## 2011-12-13 NOTE — Telephone Encounter (Signed)
[place], [place] [address] p. [phone] f. 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 She needs this faxed to her at [phone]. You can reach her at [phone] Regarding Appointment: Appt Date: Appt Time: Unknown Provider: Reason: Details: Outcome:

## 2011-12-20 ENCOUNTER — Encounter: Payer: Self-pay | Admitting: Family Medicine

## 2011-12-20 ENCOUNTER — Ambulatory Visit (INDEPENDENT_AMBULATORY_CARE_PROVIDER_SITE_OTHER): Payer: BC Managed Care – PPO | Admitting: Family Medicine

## 2011-12-20 ENCOUNTER — Telehealth: Payer: Self-pay | Admitting: Family Medicine

## 2011-12-20 ENCOUNTER — Telehealth: Payer: Self-pay

## 2011-12-20 VITALS — BP 120/78 | HR 80 | Temp 98.8°F | Ht 65.0 in | Wt 197.5 lb

## 2011-12-20 DIAGNOSIS — J019 Acute sinusitis, unspecified: Secondary | ICD-10-CM

## 2011-12-20 DIAGNOSIS — R197 Diarrhea, unspecified: Secondary | ICD-10-CM

## 2011-12-20 NOTE — Telephone Encounter (Signed)
Pt seen this AM and forgot to mention to Dr Milinda Antis that for 3 weeks after pt eats her lower abdomen cramps and then has diarrhea. After diarrhea pt said cramping goes away until she eats again.Pt said has had fever on and off. Pt said cramps feel like gas pains but she does not have gas. Pt has also had nausea on and off. Pt has not taken any med to relieve cramping or diarrhea. Pt uses [facility] if pharmacy needed.Please advise.

## 2011-12-20 NOTE — Telephone Encounter (Signed)
Patient notified as instructed by telephone. Terri said pt needs to come to office pick up sterile urine container and bring back diarrhea specimen. Terri placed order.

## 2011-12-20 NOTE — Patient Instructions (Signed)
Use nasal saline spray to flush sinuses  Keep using flonase  We will schedule CT of sinuses at check out and I will update you when we get a result

## 2011-12-20 NOTE — Progress Notes (Signed)
Subjective:    Patient ID: Nicole Daniels, female    DOB: 11-Dec-1950, 61 y.o.   MRN: [id number]  HPI Is on 3-4 th week of symptoms   Just finished Monday -was on augmentin - took it for 10 days , and then zpak because she still had symptoms and vertigo  Meclizine helped  Now face hurts under eyes and teeth feel sore and jaw Some nausea and light headed  Yellow nasal drainage  Fever low grade off and on  slt cough  Tired  Patient Active Problem List  Diagnoses  . NEOPLASM, MALIGNANT, SKIN, FACE  . UNSPECIFIED VITAMIN D DEFICIENCY  . HYPERLIPIDEMIA  . OBESITY  . DEPRESSION, MILD  . EUSTACHIAN TUBE DYSFUNCTION  . SINUSITIS - ACUTE-NOS  . SINUSITIS, CHRONIC  . RENAL CYST  . PATENT FORAMEN OVALE  . DIZZINESS  . FATIGUE  . PALPITATIONS  . RLQ PAIN  . NEOPLASM, MALIGNANT, SKIN, FACE  . MIGRAINE, CHRONIC  . SHORTNESS OF BREATH  . CHEST PAIN  . HYPERTENSION, UNSPECIFIED  . Sore throat  . Post-menopausal bleeding  . Hematuria  . Vertigo   Past Medical History  Diagnosis Date  . Chest pain, unspecified   . Depressive disorder, not elsewhere classified   . Dizziness and giddiness   . Dysfunction of eustachian tube   . Other malaise and fatigue   . Other and unspecified hyperlipidemia   . Unspecified essential hypertension   . Migraine, unspecified, without mention of intractable migraine without mention of status migrainosus   . Obesity, unspecified   . Neoplasm of skin of face     malignant  . Palpitations   . Ostium secundum type atrial septal defect   . Acquired cyst of kidney   . Abdominal pain, right lower quadrant   . Shortness of breath   . Acute sinusitis, unspecified   . Unspecified sinusitis (chronic)   . Special screening for malignant neoplasms, colon   . Special screening for malignant neoplasms of other sites   . Unspecified vitamin D deficiency   . Routine general medical examination at a health care facility   . OA (osteoarthritis)     knee    Past Surgical History  Procedure Date  . Partial hysterectomy     right ovary remains   History  Substance Use Topics  . Smoking status: Never Smoker   . Smokeless tobacco: Not on file  . Alcohol Use: No   Family History  Problem Relation Age of Onset  . Cancer Cousin     breast  . Cirrhosis Father   . Hypertension Mother   . Hypertension Sister    Allergies  Allergen Reactions  . Simvastatin Other (See Comments)    Leg pain   Current Outpatient Prescriptions on File Prior to Visit  Medication Sig Dispense Refill  . aspirin 81 MG tablet Take 81 mg by mouth daily.        Marland Kitchen atenolol (TENORMIN) 25 MG tablet TAKE 1 TABLET BY MOUTH EVERY DAY  30 tablet  6  . fluticasone (FLONASE) 50 MCG/ACT nasal spray Place 2 sprays into the nose daily.  16 g  6  . hydrochlorothiazide (HYDRODIURIL) 25 MG tablet Take 1 tablet (25 mg total) by mouth daily.  30 tablet  6  . nortriptyline (PAMELOR) 10 MG capsule Take 20 mg by mouth at bedtime.           Review of Systems Review of Systems  Constitutional: Negative for, appetite changeand  unexpected weight change. pos for fatigue Eyes: Negative for pain and visual disturbance.  ENT pos for cong/ sinus pain / st Respiratory: Negative for sob or wheeze  Cardiovascular: Negative for cp or palpitations    Gastrointestinal: Negative for nausea, diarrhea and constipation.  Genitourinary: Negative for urgency and frequency.  Skin: Negative for pallor or rash   Neurological: Negative for weakness, light-headedness, numbness and headaches.  Hematological: Negative for adenopathy. Does not bruise/bleed easily.  Psychiatric/Behavioral: Negative for dysphoric mood. The patient is not nervous/anxious.          Objective:   Physical Exam  Constitutional: She appears well-developed and well-nourished. No distress.       overwt and well appearing   HENT:  Head: Normocephalic and atraumatic.  Right Ear: External ear normal.  Left Ear: External ear  normal.  Mouth/Throat: Oropharynx is clear and moist. No oropharyngeal exudate.       Nares are injected and congested  Diffuse sinus TTP over frontal and maxillary areas No temporal tenderness  Eyes: Conjunctivae and EOM are normal. Pupils are equal, round, and reactive to light. Right eye exhibits no discharge. Left eye exhibits no discharge.  Neck: Normal range of motion. Neck supple. No thyromegaly present.  Cardiovascular: Normal rate, regular rhythm and normal heart sounds.   Pulmonary/Chest: Effort normal and breath sounds normal. No respiratory distress. She has no wheezes.  Lymphadenopathy:    She has no cervical adenopathy.  Neurological: She is alert. No cranial nerve deficit.  Skin: Skin is warm and dry. No rash noted. No erythema. No pallor.  Psychiatric: She has a normal mood and affect.          Assessment & Plan:

## 2011-12-20 NOTE — Telephone Encounter (Signed)
Triage Record Num: [id number] Operator: Lesli Albee Patient Name: Nicole Daniels Call Date & Time: 12/19/2011 4:00:54PM Patient Phone: [phone] PCP: Ruthe Mannan Patient Gender: Female PCP Fax : [phone] Patient DOB: 27-Mar-1951 Practice Name: [facility] Reason for Call: Caller: [name]/Patient; PCP: Ruthe Mannan Nestor Ramp); CB#: [phone]; ; ; Call regarding Sinus Problems Still Some Better; Pt has finished 2 rounds of abx for a sinus infection/ she did 10 days of Augmentin followed by a z-pac which she finished yesterday. Pt is still having vertigo and nausea. Pt was instructed to call back to see Dr. Clifton Custard (but she is on vacation). Pt still has the same sx /sinus pain/pressure/ sore teeth and a mild h/a. No fever. Pt advised to schedule f/u appt. Rn checked schedule for tomorrow, there are no appts available. Transferred pt to the scheduler to see if they can fit her in for a morning appt. Protocol(s) Used: Dizziness or Vertigo Recommended Outcome per Protocol: See Provider within 24 hours Reason for Outcome: Previously evaluated and worsening symptoms interfering with ability to carry out activities of daily living (ADLs) Care Advice: ~ 12/19/2011 4:22:32PM Page 1 of 1 [email or web address]

## 2011-12-20 NOTE — Telephone Encounter (Signed)
Patient on schedule with Dr. Karie Schwalbe today.

## 2011-12-20 NOTE — Assessment & Plan Note (Signed)
This has not responded to course of augmentin and zithromax  Hx of chronic sinusitis as well  CT sinuses ordered Disc symptomatic care - see instructions on AVS  Will udpate

## 2011-12-20 NOTE — Telephone Encounter (Signed)
Since she has been on abx needs to be tested for cdiff Please order cdiff stool test for dx diarrhea  If worse f/u- since I did not examine her fully for that

## 2011-12-20 NOTE — Telephone Encounter (Signed)
Pt could be seen by myself at 12:15pm.  Or seems like has appt scheduled for Friday with Dr. Karie Schwalbe?

## 2011-12-21 ENCOUNTER — Encounter: Payer: Self-pay | Admitting: *Deleted

## 2011-12-22 ENCOUNTER — Ambulatory Visit: Payer: BC Managed Care – PPO | Admitting: Family Medicine

## 2011-12-22 DIAGNOSIS — Z0289 Encounter for other administrative examinations: Secondary | ICD-10-CM

## 2011-12-25 ENCOUNTER — Ambulatory Visit (INDEPENDENT_AMBULATORY_CARE_PROVIDER_SITE_OTHER)
Admission: RE | Admit: 2011-12-25 | Discharge: 2011-12-25 | Disposition: A | Discharge status: Home / Self Care | Payer: BC Managed Care – PPO | Source: Ambulatory Visit | Attending: Family Medicine | Admitting: Family Medicine

## 2011-12-25 DIAGNOSIS — J019 Acute sinusitis, unspecified: Secondary | ICD-10-CM

## 2011-12-27 ENCOUNTER — Telehealth: Payer: Self-pay | Admitting: *Deleted

## 2011-12-27 DIAGNOSIS — R42 Dizziness and giddiness: Secondary | ICD-10-CM

## 2011-12-27 DIAGNOSIS — J329 Chronic sinusitis, unspecified: Secondary | ICD-10-CM

## 2011-12-27 NOTE — Telephone Encounter (Signed)
Patient notified as instructed by telephone. Pt is agreeable for ENT referral. Pt said she had seen [facility] before(pt could not remember which dr). Pt will wait to hear from pt care coordinator. Pt also scheduled appt to see Dr Dayton Martes 12/28/11 at 10:45am for urinary problem.

## 2011-12-27 NOTE — Telephone Encounter (Signed)
Thanks - looks negative for sinus infection or other problems  How are her symptoms?

## 2011-12-27 NOTE — Telephone Encounter (Signed)
Patient notified as instructed by telephone. Pt said she is still nauseated, light headed, nose stuffy and cheek bones hurt.Pt said the constant ringing in her ears really bothers her.Pt also wanted Dr Milinda Antis to know that she has voided 7 times since this morning. Pt said this morning there was bright red blood in the toilet bowl also. Pt said she has not seen blood this afternoon but she has a pressure feeling along with frequency today. Pt said she seems to void a normal amt each time and has had abdominal cramping on and off for several months. Pt uses [facility] if pharmacy needed. Pt can be reached at work until 4:30pm and then after that on cell or home #.

## 2011-12-27 NOTE — Telephone Encounter (Signed)
Patient is calling to get the results of her CT scan.  It doesn't look like the results have been reviewed.  Please advise.

## 2011-12-27 NOTE — Telephone Encounter (Signed)
Thanks- doing ref now

## 2011-12-27 NOTE — Telephone Encounter (Signed)
Left vm for pt to callback 

## 2011-12-27 NOTE — Telephone Encounter (Signed)
I would consider ref to ENT for ongoing sinus issues/ dizzy/ tinnitis - let me know if agreeable  The voiding symptoms are worrisome- she needs f/u with [facility] tomorrow Dayton Martes is primary) Thanks

## 2011-12-28 ENCOUNTER — Ambulatory Visit (INDEPENDENT_AMBULATORY_CARE_PROVIDER_SITE_OTHER): Payer: BC Managed Care – PPO | Admitting: Family Medicine

## 2011-12-28 VITALS — BP 114/60 | HR 68 | Temp 98.3°F | Wt 196.0 lb

## 2011-12-28 DIAGNOSIS — R300 Dysuria: Secondary | ICD-10-CM

## 2011-12-28 LAB — POCT URINALYSIS DIPSTICK
Bilirubin, UA: NEGATIVE
Glucose, UA: NEGATIVE
Nitrite, UA: NEGATIVE

## 2011-12-28 MED ORDER — PHENAZOPYRIDINE HCL 100 MG PO TABS: 100.0000 mg | ORAL_TABLET | Freq: Three times a day (TID) | ORAL | Status: AC | PRN

## 2011-12-28 MED ORDER — CIPROFLOXACIN HCL 500 MG PO TABS: 500.0000 mg | ORAL_TABLET | Freq: Two times a day (BID) | ORAL | Status: AC

## 2011-12-28 NOTE — Progress Notes (Signed)
SUBJECTIVE: Nicole Daniels is a 61 y.o. female who complains of urinary frequency, urgency and dysuria x 1 month, acute worse this past week, with flank pain, without fever, chills, or abnormal vaginal discharge or bleeding.   Patient Active Problem List  Diagnoses  . NEOPLASM, MALIGNANT, SKIN, FACE  . UNSPECIFIED VITAMIN D DEFICIENCY  . HYPERLIPIDEMIA  . OBESITY  . DEPRESSION, MILD  . EUSTACHIAN TUBE DYSFUNCTION  . SINUSITIS - ACUTE-NOS  . SINUSITIS, CHRONIC  . RENAL CYST  . PATENT FORAMEN OVALE  . DIZZINESS  . FATIGUE  . PALPITATIONS  . RLQ PAIN  . NEOPLASM, MALIGNANT, SKIN, FACE  . MIGRAINE, CHRONIC  . SHORTNESS OF BREATH  . CHEST PAIN  . HYPERTENSION, UNSPECIFIED  . Sore throat  . Post-menopausal bleeding  . Hematuria  . Vertigo   Past Medical History  Diagnosis Date  . Chest pain, unspecified   . Depressive disorder, not elsewhere classified   . Dizziness and giddiness   . Dysfunction of eustachian tube   . Other malaise and fatigue   . Other and unspecified hyperlipidemia   . Unspecified essential hypertension   . Migraine, unspecified, without mention of intractable migraine without mention of status migrainosus   . Obesity, unspecified   . Neoplasm of skin of face     malignant  . Palpitations   . Ostium secundum type atrial septal defect   . Acquired cyst of kidney   . Abdominal pain, right lower quadrant   . Shortness of breath   . Acute sinusitis, unspecified   . Unspecified sinusitis (chronic)   . Unspecified vitamin D deficiency   . OA (osteoarthritis)     knee  . Angiomyolipoma     kidney  . Liver lesion 2011  . Diverticulosis   . Adenomatous colon polyp   . IBS (irritable bowel syndrome)   . History of diverticulitis of colon    Past Surgical History  Procedure Date  . Partial hysterectomy     right ovary remains  . Dilation and curettage of uterus   . Cholecystectomy   . Breast reduction surgery 1990  . Tonsillectomy 1972  .  Left oophorectomy 1976  . Total knee arthroplasty     right  . Knee arthrotomy     right   History  Substance Use Topics  . Smoking status: Never Smoker   . Smokeless tobacco: Not on file  . Alcohol Use: No   Family History  Problem Relation Age of Onset  . Breast cancer Cousin   . Cirrhosis Father   . Hypertension Mother   . Hypertension Sister   . Diabetes Mother   . Diabetes Cousin   . Diabetes      aunt  . Stroke      aunt  . Heart disease Father   . Heart disease Brother     x 2   Allergies  Allergen Reactions  . Simvastatin Other (See Comments)    Leg pain   Current Outpatient Prescriptions on File Prior to Visit  Medication Sig Dispense Refill  . aspirin 81 MG tablet Take 81 mg by mouth daily.        Marland Kitchen atenolol (TENORMIN) 25 MG tablet TAKE 1 TABLET BY MOUTH EVERY DAY  30 tablet  6  . fluticasone (FLONASE) 50 MCG/ACT nasal spray Place 2 sprays into the nose daily.  16 g  6  . hydrochlorothiazide (HYDRODIURIL) 25 MG tablet Take 1 tablet (25 mg total) by mouth daily.  30 tablet  6  . nortriptyline (PAMELOR) 10 MG capsule Take 20 mg by mouth at bedtime.        The PMH, PSH, Social History, Family History, Medications, and allergies have been reviewed in [facility], and have been updated if relevant.  OBJECTIVE:  BP 114/60  Pulse 68  Temp(Src) 98.3 F (36.8 C) (Oral)  Wt 196 lb (88.905 kg)  Appears well, in no apparent distress.  Vital signs are normal. The abdomen is soft without tenderness, guarding, mass, rebound or organomegaly. No CVA tenderness or inguinal adenopathy noted. Urine dipstick shows positive for WBC's, positive for RBC's and positive for leukocytes.    ASSESSMENT: UTI complicated  PLAN: Treatment per orders - also push fluids, may use Pyridium OTC prn. Call or return to clinic prn if these symptoms worsen or fail to improve as anticipated.

## 2011-12-28 NOTE — Patient Instructions (Signed)
Good to see you. Please take cipro as directed- 1 tablet twice daily for 10 days. Drink plenty of fluids, use pyridum as directed (as needed for pain).

## 2011-12-31 LAB — URINE CULTURE

## 2012-01-02 ENCOUNTER — Ambulatory Visit (INDEPENDENT_AMBULATORY_CARE_PROVIDER_SITE_OTHER): Payer: BC Managed Care – PPO | Admitting: Internal Medicine

## 2012-01-02 ENCOUNTER — Other Ambulatory Visit (INDEPENDENT_AMBULATORY_CARE_PROVIDER_SITE_OTHER): Payer: BC Managed Care – PPO

## 2012-01-02 ENCOUNTER — Encounter: Payer: Self-pay | Admitting: Internal Medicine

## 2012-01-02 VITALS — BP 118/60 | HR 72 | Ht 65.0 in | Wt 198.0 lb

## 2012-01-02 DIAGNOSIS — K625 Hemorrhage of anus and rectum: Secondary | ICD-10-CM

## 2012-01-02 DIAGNOSIS — K648 Other hemorrhoids: Secondary | ICD-10-CM

## 2012-01-02 LAB — CBC WITH DIFFERENTIAL/PLATELET
Basophils Absolute: 0 10*3/uL (ref 0.0–0.1)
Eosinophils Absolute: 0.3 10*3/uL (ref 0.0–0.7)
Lymphocytes Relative: 37.3 % (ref 12.0–46.0)
MCHC: 33.9 g/dL (ref 30.0–36.0)
Neutro Abs: 3.8 10*3/uL (ref 1.4–7.7)
Neutrophils Relative %: 51.3 % (ref 43.0–77.0)
Platelets: 376 10*3/uL (ref 150.0–400.0)
RDW: 12.9 % (ref 11.5–14.6)

## 2012-01-02 LAB — IBC PANEL
Iron: 66 ug/dL (ref 42–145)
Saturation Ratios: 16.2 % — ABNORMAL LOW (ref 20.0–50.0)
Transferrin: 291.1 mg/dL (ref 212.0–360.0)

## 2012-01-02 NOTE — Patient Instructions (Signed)
You have been scheduled for a flexible sigmoidoscopy with hemorrhoidal banding. Please follow the written instructions given to you at your visit today. Your physician has requested that you go to the basement for the following lab work before leaving today: CBC, IBC CC: Dr Ruthe Mannan

## 2012-01-02 NOTE — Progress Notes (Signed)
Nicole Daniels 1951/03/07 MRN [id number]   History of Present Illness:  This is a 61 year old white female with rectal irritation and hemorrhoids of 30 years duration. She has tried conservative treatment with suppositories and creams but she is ready to have something more definite. She describes blood on toilet tissue, irritation and seepage. She has episodes of diarrhea which makes the hemorrhoids worse. A colonoscopy in March 2011 showed a tubular adenoma and moderate diverticulosis of the left colon. A pelvic ultrasound in September 2012 showed her to be status post hysterectomy and left oophorectomy.   Past Medical History  Diagnosis Date  . Chest pain, unspecified   . Depressive disorder, not elsewhere classified   . Dizziness and giddiness   . Dysfunction of eustachian tube   . Other malaise and fatigue   . Other and unspecified hyperlipidemia   . Unspecified essential hypertension   . Migraine, unspecified, without mention of intractable migraine without mention of status migrainosus   . Obesity, unspecified   . Neoplasm of skin of face     malignant  . Palpitations   . Ostium secundum type atrial septal defect   . Acquired cyst of kidney   . Abdominal pain, right lower quadrant   . Shortness of breath   . Acute sinusitis, unspecified   . Unspecified sinusitis (chronic)   . Unspecified vitamin D deficiency   . OA (osteoarthritis)     knee  . Angiomyolipoma     kidney  . Liver lesion 2011  . Diverticulosis   . Adenomatous colon polyp   . IBS (irritable bowel syndrome)   . History of diverticulitis of colon   . Hemorrhoids    Past Surgical History  Procedure Date  . Partial hysterectomy     right ovary remains  . Dilation and curettage of uterus   . Cholecystectomy   . Breast reduction surgery 1990  . Tonsillectomy 1972  . Left oophorectomy 1976  . Knee arthrotomy     right    reports that she has never smoked. She has never used smokeless tobacco. She  reports that she does not drink alcohol or use illicit drugs. family history includes Breast cancer in her cousin; Cirrhosis in her father; Diabetes in her cousin, mother, and unspecified family member; Heart disease in her brother and father; Hypertension in her mother and sister; and Stroke in an unspecified family member.  There is no history of Colon cancer. Allergies  Allergen Reactions  . Simvastatin Other (See Comments)    Leg pain        Review of Systems: Denies abdominal pain. Positive for small amount of rectal bleeding denies shortness of breath or chest pain  The remainder of the 10 point ROS is negative except as outlined in H&P   Physical Exam: General appearance  Well developed, in no distress. Eyes- non icteric. HEENT nontraumatic, normocephalic. Mouth no lesions, tongue papillated, no cheilosis. Neck supple without adenopathy, thyroid not enlarged, no carotid bruits, no JVD. Lungs Clear to auscultation bilaterally. Cor normal S1, normal S2, regular rhythm, no murmur,  quiet precordium. Abdomen: Soft abdomen with normal active bowel sounds. No distention. No tenderness. Liver edge at costal margin. Rectal: And anoscopic exam reveals 2 skin tags externally. Normal rectal sphincter tone. At least 2 first grade internal hemorrhoids mildly edematous, multiple prolapsing. Stool is Hemoccult negative. There is no evidence of thrombosis. Extremities no pedal edema. Skin no lesions. Neurological alert and oriented x 3. Psychological normal mood and affect.  Assessment and Plan:  Problem #1 Symptomatic first grade internal hemorrhoids refractory to conservative treatment. We have discussed the hemorrhoidal banding ligation. She would like to proceed. We will schedule her as an outpatient. In the meantime, she will continue conservative measures. We are also checking her CBC and iron studies.   01/02/2012 Lina Sar

## 2012-01-05 ENCOUNTER — Telehealth: Payer: Self-pay | Admitting: Internal Medicine

## 2012-01-05 NOTE — Telephone Encounter (Signed)
Left a message for patient to call me. 

## 2012-01-08 ENCOUNTER — Ambulatory Visit (HOSPITAL_COMMUNITY)
Admission: RE | Admit: 2012-01-08 | Discharge: 2012-01-08 | Disposition: A | Discharge status: Home / Self Care | Payer: BC Managed Care – PPO | Source: Ambulatory Visit | Attending: Internal Medicine | Admitting: Internal Medicine

## 2012-01-08 ENCOUNTER — Encounter (HOSPITAL_COMMUNITY): Payer: Self-pay | Admitting: *Deleted

## 2012-01-08 ENCOUNTER — Encounter (HOSPITAL_COMMUNITY)
Admission: RE | Disposition: A | Discharge status: Home / Self Care | Payer: Self-pay | Source: Ambulatory Visit | Attending: Internal Medicine

## 2012-01-08 ENCOUNTER — Other Ambulatory Visit: Payer: Self-pay | Admitting: Internal Medicine

## 2012-01-08 DIAGNOSIS — F329 Major depressive disorder, single episode, unspecified: Secondary | ICD-10-CM

## 2012-01-08 DIAGNOSIS — E669 Obesity, unspecified: Secondary | ICD-10-CM | POA: Insufficient documentation

## 2012-01-08 DIAGNOSIS — E8809 Other disorders of plasma-protein metabolism, not elsewhere classified: Secondary | ICD-10-CM | POA: Insufficient documentation

## 2012-01-08 DIAGNOSIS — K648 Other hemorrhoids: Secondary | ICD-10-CM

## 2012-01-08 DIAGNOSIS — Q211 Atrial septal defect: Secondary | ICD-10-CM | POA: Insufficient documentation

## 2012-01-08 DIAGNOSIS — Z85820 Personal history of malignant melanoma of skin: Secondary | ICD-10-CM | POA: Insufficient documentation

## 2012-01-08 DIAGNOSIS — N281 Cyst of kidney, acquired: Secondary | ICD-10-CM

## 2012-01-08 DIAGNOSIS — G43909 Migraine, unspecified, not intractable, without status migrainosus: Secondary | ICD-10-CM | POA: Insufficient documentation

## 2012-01-08 DIAGNOSIS — K625 Hemorrhage of anus and rectum: Secondary | ICD-10-CM

## 2012-01-08 DIAGNOSIS — F3289 Other specified depressive episodes: Secondary | ICD-10-CM

## 2012-01-08 DIAGNOSIS — M1710 Unilateral primary osteoarthritis, unspecified knee: Secondary | ICD-10-CM | POA: Insufficient documentation

## 2012-01-08 DIAGNOSIS — D3000 Benign neoplasm of unspecified kidney: Secondary | ICD-10-CM | POA: Insufficient documentation

## 2012-01-08 DIAGNOSIS — I10 Essential (primary) hypertension: Secondary | ICD-10-CM | POA: Insufficient documentation

## 2012-01-08 DIAGNOSIS — Q2111 Secundum atrial septal defect: Secondary | ICD-10-CM | POA: Insufficient documentation

## 2012-01-08 DIAGNOSIS — K589 Irritable bowel syndrome without diarrhea: Secondary | ICD-10-CM | POA: Insufficient documentation

## 2012-01-08 HISTORY — DX: Cyst of kidney, acquired: N28.1

## 2012-01-08 HISTORY — PX: FLEXIBLE SIGMOIDOSCOPY: SHX5431

## 2012-01-08 HISTORY — DX: Other specified depressive episodes: F32.89

## 2012-01-08 HISTORY — DX: Major depressive disorder, single episode, unspecified: F32.9

## 2012-01-08 SURGERY — SIGMOIDOSCOPY, FLEXIBLE
Anesthesia: Moderate Sedation

## 2012-01-08 MED ORDER — MIDAZOLAM HCL 10 MG/2ML IJ SOLN
INTRAMUSCULAR | Status: AC
  Filled 2012-01-08: qty 4

## 2012-01-08 MED ORDER — DIPHENHYDRAMINE HCL 50 MG/ML IJ SOLN
INTRAMUSCULAR | Status: AC
  Filled 2012-01-08: qty 1

## 2012-01-08 MED ORDER — FENTANYL CITRATE 0.05 MG/ML IJ SOLN
INTRAMUSCULAR | Status: AC
  Filled 2012-01-08: qty 4

## 2012-01-08 MED ORDER — MIDAZOLAM HCL 10 MG/2ML IJ SOLN
INTRAMUSCULAR | Status: DC | PRN
  Administered 2012-01-08 (×2): 2.5 mg via INTRAVENOUS

## 2012-01-08 MED ORDER — FENTANYL CITRATE 0.05 MG/ML IJ SOLN
INTRAMUSCULAR | Status: DC | PRN
  Administered 2012-01-08 (×3): 25 ug via INTRAVENOUS

## 2012-01-08 MED ORDER — SODIUM CHLORIDE 0.9 % IV SOLN
Freq: Once | INTRAVENOUS | Status: AC
  Administered 2012-01-08: 11:00:00 via INTRAVENOUS

## 2012-01-08 MED ORDER — HYDROCODONE-ACETAMINOPHEN 5-325 MG PO TABS: 1.0000 | ORAL_TABLET | Freq: Four times a day (QID) | ORAL | Status: AC | PRN

## 2012-01-08 NOTE — Op Note (Signed)
[facility] [address] [place], [place]  [address]  FLEXIBLE SIGMOIDOSCOPY PROCEDURE REPORT  PATIENT:  Nicole Daniels, Nicole Daniels  MR#:  [id number] BIRTHDATE:  1951-10-13, 60 yrs. old  GENDER:  female  ENDOSCOPIST:  Hedwig Morton. Juanda Chance, MD Referred by:  Ruthe Mannan, M.D.  PROCEDURE DATE:  01/08/2012 PROCEDURE:  Flexible Sigmoidoscopy with banding ASA CLASS:  Class II INDICATIONS:  hematochezia, treatment of hemorrhoids failed conservative Rx with supp. and topical steroids  MEDICATIONS:   These medications were titrated to patient response per physician's verbal order, Fentanyl 75 mcg, Versed 5 mg  DESCRIPTION OF PROCEDURE:   After the risks benefits and alternatives of the procedure were thoroughly explained, informed consent was obtained.  Digital rectal exam was performed and revealed no rectal masses.   The  endoscope was introduced through the anus and advanced to the descending colon, without limitations.  The quality of the prep was fair.  The instrument was then slowly withdrawn as the mucosa was fully examined. <<PROCEDUREIMAGES>>  Internal and external hemorrhoids were found. first grade hemorrhoids, no thrombosis Hemorrhoidal banding was performed (see image1). 7 bands depoyed, 4 bands applied, 3 bands slipped, hemorrhoids not large, they were difficult to suction into the scope   Retroflexed views in the rectum revealed no abnormalities. The scope was then withdrawn from the patient and the procedure terminated.  COMPLICATIONS:  None  ENDOSCOPIC IMPRESSION: 1) Internal and external hemorrhoids s/p band ligation x 4 ( 7 deployed) RECOMMENDATIONS: 1) fiber rich diet continue Anusol HC supp Analpram cream 2.5 % Vicodin prn rectal pain, #20  REPEAT EXAM:  In 0 year(s) for.  ______________________________ Hedwig Morton. Juanda Chance, MD  CC:  n. eSIGNED:   Hedwig Morton. Maeli Spacek at 01/08/2012 12:00 PM  Anise Salvo, [phone]

## 2012-01-08 NOTE — Discharge Instructions (Addendum)
Hemorrhoid Banding Hemorrhoids are veins in the anus and lower rectum that become enlarged. The most common symptoms are rectal bleeding, itching, and sometimes pain. Hemorrhoids might come out with straining or having a bowel movement, and they can sometimes be pushed back in. There are internal and external hemorrhoids. Only internal hemorrhoids can be treated with banding. In this procedure, a rubber band is placed near the hemorrhoid tissue, cutting off the blood supply. This procedure prevents the hemorrhoids from slipping down. LET YOUR CAREGIVER KNOW ABOUT: All medicines you are taking, especially blood thinners such as aspirin and coumadin.  RISKS AND COMPLICATIONS This is not a painful procedure, but if you do have intense pain immediately let your surgeon know because the band may need to be removed. You may have some mild pain or discomfort in the first 2 days or so after treatment. Sometimes there may be delayed bleeding in the first week after treatment.  BEFORE THE PROCEDURE  There is no special preparation needed before banding. Your surgeon may have you do an enema prior to the procedure. You will go home the same day.  HOME CARE INSTRUCTIONS   Your surgeon might instruct you to do sitz baths as needed if you have discomfort or after a bowel movement.   You may be instructed to use fiber supplements.  SEEK MEDICAL CARE IF:  You have an increase in pain.   Your pain does not get better.  SEEK IMMEDIATE MEDICAL CARE IF:  You have intense pain.   Fever greater than 100.5 F (38.1 C).   Bleeding that does not stop, or pus from the anus.  Document Released: 08/13/2009 Document Revised: 10/05/2011 Document Reviewed: 08/13/2009 [facility] Patient Information 2012 [place], [place].Flexible Sigmoidoscopy Your caregiver has ordered a flexible sigmoidoscopy. This is an exam to evaluate your lower colon. In this exam your colon is cleansed and a short fiber optic tube is inserted through  your rectum and into your colon. The fiber optic scope (endoscope) is a short bundle of enclosed flexible small glass fibers. It transmits light to the area examined and images from that area to your caregiver. You do not have to worry about glass breakage in the endoscope. Discomfort is usually minimal. Sedatives and pain medications are generally not required. This exam helps to detect tumors (lumps), polyps, inflammation (swelling and soreness), and areas of bleeding. It may also be used to take biopsies. These are small pieces of tissue taken to examine under a microscope. LET YOUR CAREGIVER KNOW ABOUT:  Allergies.   Medications taken including herbs, eye drops, over the counter medications, and creams.   Use of steroids (by mouth or creams).   Previous problems with anesthetics or novocaine   Possibility of pregnancy, if this applies.   History of blood clots (thrombophlebitis).   History of bleeding or blood problems.   Previous surgery.   Other health problems.  BEFORE THE PROCEDURE Eat normally the night before the exam. Your caregiver may order a mild enema or laxative the night before. No eating or drinking should occur after midnight until the procedure is completed. A rectal suppository or enemas may be given in the morning prior to your procedure. You will be brought to the examination area in a hospital gown. You should be present 60 minutes prior to your procedure or as directed.  AFTER THE PROCEDURE   There is sometimes a little blood passed with the first bowel movement. Do not be concerned. Because air is often used during  the exam, it is not unusual to pass gas and experience abdominal (belly) cramping. Walking or a warm pack on your abdomen may help with this. Do not sleep with a heating pad as burns can occur.   You may resume all normal eating and activities.   Only take over-the-counter or prescription medicines for pain, discomfort, or fever as directed by your  caregiver. Do not use aspirin or blood thinners if a biopsy (tissue sample) was taken. Consult your caregiver for medication usage if biopsies were taken.   Call for your results as instructed by your caregiver. Remember, it is your responsibility to obtain the results of your biopsy. Do not assume everything is fine because you do not hear from your caregiver.  SEEK IMMEDIATE MEDICAL CARE IF:  An oral temperature above 102 F (38.9 C) develops.   You pass large blood clots or fill a toilet with blood following the procedure. This may also occur 10 to 14 days following the procedure. It is more likely if a biopsy was taken.   You develop abdominal pain not relieved with medication or that is getting worse rather than better.  Document Released: 10/13/2000 Document Revised: 10/05/2011 Document Reviewed: 07/26/2005 [facility] Patient Information 2012 [place], [place].

## 2012-01-08 NOTE — H&P (View-Only) (Signed)
Nicole Daniels 02/15/1951 MRN [id number]   History of Present Illness:  This is a 60-year-old white female with rectal irritation and hemorrhoids of 30 years duration. She has tried conservative treatment with suppositories and creams but she is ready to have something more definite. She describes blood on toilet tissue, irritation and seepage. She has episodes of diarrhea which makes the hemorrhoids worse. A colonoscopy in March 2011 showed a tubular adenoma and moderate diverticulosis of the left colon. A pelvic ultrasound in September 2012 showed her to be status post hysterectomy and left oophorectomy.   Past Medical History  Diagnosis Date  . Chest pain, unspecified   . Depressive disorder, not elsewhere classified   . Dizziness and giddiness   . Dysfunction of eustachian tube   . Other malaise and fatigue   . Other and unspecified hyperlipidemia   . Unspecified essential hypertension   . Migraine, unspecified, without mention of intractable migraine without mention of status migrainosus   . Obesity, unspecified   . Neoplasm of skin of face     malignant  . Palpitations   . Ostium secundum type atrial septal defect   . Acquired cyst of kidney   . Abdominal pain, right lower quadrant   . Shortness of breath   . Acute sinusitis, unspecified   . Unspecified sinusitis (chronic)   . Unspecified vitamin D deficiency   . OA (osteoarthritis)     knee  . Angiomyolipoma     kidney  . Liver lesion 2011  . Diverticulosis   . Adenomatous colon polyp   . IBS (irritable bowel syndrome)   . History of diverticulitis of colon   . Hemorrhoids    Past Surgical History  Procedure Date  . Partial hysterectomy     right ovary remains  . Dilation and curettage of uterus   . Cholecystectomy   . Breast reduction surgery 1990  . Tonsillectomy 1972  . Left oophorectomy 1976  . Knee arthrotomy     right    reports that she has never smoked. She has never used smokeless tobacco. She  reports that she does not drink alcohol or use illicit drugs. family history includes Breast cancer in her cousin; Cirrhosis in her father; Diabetes in her cousin, mother, and unspecified family member; Heart disease in her brother and father; Hypertension in her mother and sister; and Stroke in an unspecified family member.  There is no history of Colon cancer. Allergies  Allergen Reactions  . Simvastatin Other (See Comments)    Leg pain        Review of Systems: Denies abdominal pain. Positive for small amount of rectal bleeding denies shortness of breath or chest pain  The remainder of the 10 point ROS is negative except as outlined in H&P   Physical Exam: General appearance  Well developed, in no distress. Eyes- non icteric. HEENT nontraumatic, normocephalic. Mouth no lesions, tongue papillated, no cheilosis. Neck supple without adenopathy, thyroid not enlarged, no carotid bruits, no JVD. Lungs Clear to auscultation bilaterally. Cor normal S1, normal S2, regular rhythm, no murmur,  quiet precordium. Abdomen: Soft abdomen with normal active bowel sounds. No distention. No tenderness. Liver edge at costal margin. Rectal: And anoscopic exam reveals 2 skin tags externally. Normal rectal sphincter tone. At least 2 first grade internal hemorrhoids mildly edematous, multiple prolapsing. Stool is Hemoccult negative. There is no evidence of thrombosis. Extremities no pedal edema. Skin no lesions. Neurological alert and oriented x 3. Psychological normal mood and affect.    Assessment and Plan:  Problem #1 Symptomatic first grade internal hemorrhoids refractory to conservative treatment. We have discussed the hemorrhoidal banding ligation. She would like to proceed. We will schedule her as an outpatient. In the meantime, she will continue conservative measures. We are also checking her CBC and iron studies.   01/02/2012 Nicole Daniels 

## 2012-01-08 NOTE — Telephone Encounter (Signed)
Patient had procedure today.

## 2012-01-08 NOTE — Interval H&P Note (Signed)
History and Physical Interval Note:  01/08/2012 11:13 AM  Nicole Daniels  has presented today for surgery, with the diagnosis of hemorrhoids  The various methods of treatment have been discussed with the patient and family. After consideration of risks, benefits and other options for treatment, the patient has consented to  Procedure(s) (LRB): FLEXIBLE SIGMOIDOSCOPY (N/A) HEMORRHOID BANDING (N/A) as a surgical intervention .  The patients' history has been reviewed, patient examined, no change in status, stable for surgery.  I have reviewed the patients' chart and labs.  Questions were answered to the patient's satisfaction.     Nicole Daniels  Pt with symptomatic hemorrhoids, refractory to conservative treatment,

## 2012-01-09 ENCOUNTER — Encounter (HOSPITAL_COMMUNITY): Payer: Self-pay | Admitting: Internal Medicine

## 2012-01-11 ENCOUNTER — Telehealth: Payer: Self-pay | Admitting: Internal Medicine

## 2012-01-11 MED ORDER — HYDROCORTISONE ACETATE 25 MG RE SUPP: RECTAL | Status: DC

## 2012-01-11 NOTE — Telephone Encounter (Signed)
Spoke with patient and gave her Dr. Marzetta Board recommendations. Rx sent

## 2012-01-11 NOTE — Telephone Encounter (Signed)
I agree with Dr Arlyce Dice, it always hurts for few days, she has to get through using Vicodin I gave her

## 2012-01-11 NOTE — Telephone Encounter (Signed)
She should take warm soaks twice a day and use Anusol HC suppositories twice a day

## 2012-01-11 NOTE — Telephone Encounter (Signed)
Patient calling to report after having the flex with banding on 01/08/12, she had done well until last night. Last night she had a soft bowel movement and afterwards felt like her "bottom was on fire and throbbing/" She took her pain medication with no relief and was up most of the night. She took a sitz bath that did help minimally. She used Prep H this AM and it seems to have helped. She has not used and pain medication today. She was afraid she had "torn something" and wanted to let us know. Does she need to do anything else? Please, advise Dr. Juanda Chance off Dr. Arlyce Dice- DOD

## 2012-02-17 ENCOUNTER — Other Ambulatory Visit: Payer: Self-pay | Admitting: Cardiology

## 2012-02-19 NOTE — Telephone Encounter (Signed)
Refilled atenolol

## 2012-03-25 ENCOUNTER — Other Ambulatory Visit: Payer: Self-pay | Admitting: Nurse Practitioner

## 2012-05-14 ENCOUNTER — Other Ambulatory Visit: Payer: Self-pay | Admitting: Cardiology

## 2012-05-14 NOTE — Telephone Encounter (Signed)
Fax Received. Refill Completed. Nicole Daniels (R.M.A)   

## 2012-09-13 ENCOUNTER — Other Ambulatory Visit: Payer: Self-pay | Admitting: Cardiology

## 2012-09-19 ENCOUNTER — Encounter: Payer: Self-pay | Admitting: Family Medicine

## 2012-09-19 ENCOUNTER — Ambulatory Visit (INDEPENDENT_AMBULATORY_CARE_PROVIDER_SITE_OTHER): Payer: BC Managed Care – PPO | Admitting: Family Medicine

## 2012-09-19 VITALS — BP 120/80 | HR 72 | Temp 98.4°F | Wt 192.0 lb

## 2012-09-19 DIAGNOSIS — J329 Chronic sinusitis, unspecified: Secondary | ICD-10-CM

## 2012-09-19 MED ORDER — CEFUROXIME AXETIL 250 MG PO TABS: 250.0000 mg | ORAL_TABLET | Freq: Two times a day (BID) | ORAL | Status: AC

## 2012-09-19 NOTE — Progress Notes (Signed)
SUBJECTIVE:  Nicole Daniels is a 61 y.o. female who complains of coryza, congestion, sneezing, sore throat, myalgias and bilateral sinus pain for 8 days. She denies a history of anorexia, chest pain, sweats and vomiting and denies a history of asthma. Patient denies smoke cigarettes.   Patient Active Problem List  Diagnosis  . NEOPLASM, MALIGNANT, SKIN, FACE  . UNSPECIFIED VITAMIN D DEFICIENCY  . HYPERLIPIDEMIA  . OBESITY  . DEPRESSION, MILD  . EUSTACHIAN TUBE DYSFUNCTION  . SINUSITIS - ACUTE-NOS  . SINUSITIS, CHRONIC  . RENAL CYST  . PATENT FORAMEN OVALE  . DIZZINESS  . FATIGUE  . PALPITATIONS  . RLQ PAIN  . NEOPLASM, MALIGNANT, SKIN, FACE  . MIGRAINE, CHRONIC  . SHORTNESS OF BREATH  . CHEST PAIN  . HYPERTENSION, UNSPECIFIED  . Sore throat  . Post-menopausal bleeding  . Hematuria  . Vertigo  . Hemorrhage of rectum and anus  . Internal hemorrhoids with other complication   Past Medical History  Diagnosis Date  . Chest pain, unspecified   . Dizziness and giddiness   . Dysfunction of eustachian tube   . Other malaise and fatigue   . Other and unspecified hyperlipidemia   . Unspecified essential hypertension   . Migraine, unspecified, without mention of intractable migraine without mention of status migrainosus   . Obesity, unspecified   . Neoplasm of skin of face     malignant  . Palpitations   . Ostium secundum type atrial septal defect   . Abdominal pain, right lower quadrant   . Shortness of breath   . Acute sinusitis, unspecified   . Unspecified sinusitis (chronic)   . Unspecified vitamin D deficiency   . OA (osteoarthritis)     knee  . Angiomyolipoma(M8860/0)     kidney  . Liver lesion 2011  . Diverticulosis   . Adenomatous colon polyp   . IBS (irritable bowel syndrome)   . History of diverticulitis of colon   . Hemorrhoids   . Acquired cyst of kidney 01/08/2012    pt denies history of   . Depressive disorder, not elsewhere classified 01/08/2012      not currently    Past Surgical History  Procedure Date  . Partial hysterectomy     right ovary remains  . Dilation and curettage of uterus   . Cholecystectomy   . Breast reduction surgery 1990  . Tonsillectomy 1972  . Left oophorectomy 1976  . Knee arthrotomy     right  . Abdominal hysterectomy   . Flexible sigmoidoscopy 01/08/2012    Procedure: FLEXIBLE SIGMOIDOSCOPY;  Surgeon: Hart Carwin, MD;  Location: [facility];  Service: Endoscopy;  Laterality: N/A;   History  Substance Use Topics  . Smoking status: Never Smoker   . Smokeless tobacco: Never Used  . Alcohol Use: No   Family History  Problem Relation Age of Onset  . Breast cancer Cousin   . Cirrhosis Father   . Hypertension Mother   . Hypertension Sister   . Diabetes Mother   . Diabetes Cousin   . Diabetes      aunt  . Stroke      aunt  . Heart disease Father   . Heart disease Brother     x 2  . Colon cancer Neg Hx    Allergies  Allergen Reactions  . Simvastatin Other (See Comments)    Leg pain   Current Outpatient Prescriptions on File Prior to Visit  Medication Sig Dispense Refill  .  aspirin 81 MG tablet Take 81 mg by mouth daily.        Marland Kitchen atenolol (TENORMIN) 25 MG tablet TAKE 1 TABLET BY MOUTH EVERY DAY  30 tablet  3  . fluticasone (FLONASE) 50 MCG/ACT nasal spray Place 2 sprays into the nose daily.  16 g  6  . hydrochlorothiazide (HYDRODIURIL) 25 MG tablet TAKE 1 TABLET BY MOUTH EVERY DAY  30 tablet  6  . nortriptyline (PAMELOR) 10 MG capsule Take 20 mg by mouth at bedtime.        The PMH, PSH, Social History, Family History, Medications, and allergies have been reviewed in [facility], and have been updated if relevant.  OBJECTIVE: BP 120/80  Pulse 72  Temp 98.4 F (36.9 C)  Wt 192 lb (87.091 kg)  She appears well, vital signs are as noted. TMs dull bilaterally.  Throat and pharynx normal.  Neck supple. No adenopathy in the neck. Nose is congested. Sinuses  tender. The chest is clear, without  wheezes or rales.  ASSESSMENT:  sinusitis  PLAN: Given duration and progression of symptoms, will treat for bacterial sinusitis. Symptomatic therapy suggested: push fluids, rest and return office visit prn if symptoms persist or worsen. Call or return to clinic prn if these symptoms worsen or fail to improve as anticipated.

## 2012-09-19 NOTE — Patient Instructions (Addendum)
Take Ceftin as directed- 1 tablet twice daily for 10 days.    Drink lots of fluids. Restart your flonase.    Treat sympotmatically with Mucinex, nasal saline irrigation, and Tylenol/Ibuprofen.   Also try claritin D or zyrtec D over the counter- two times a day as needed ( have to sign for them at pharmacy). You can use warm compresses.    Call if not improving as expected in 5-7 days.

## 2012-09-23 ENCOUNTER — Telehealth: Payer: Self-pay | Admitting: Family Medicine

## 2012-09-23 MED ORDER — AZITHROMYCIN 250 MG PO TABS: ORAL_TABLET | ORAL | Status: DC

## 2012-09-23 NOTE — Telephone Encounter (Signed)
:   Pt was dx with sinusitis on Thursday 09/19/2012. Pt was put on Cefuroxime 250mg  BID. Pt is calling to say she feels worse. She is more lightheaded with aching jaw and ears and sinuses. No fever. Is there another medication that can be prescribed?

## 2012-09-23 NOTE — Telephone Encounter (Signed)
Advised patient script has been sent.

## 2012-09-23 NOTE — Telephone Encounter (Signed)
Please send in rx for zpack to her pharmacy. Azithromycin 250 mg tablets- 2 tabs by mouth on day 1 followed by 1 tab by mouth daily days 2-5.

## 2012-11-17 ENCOUNTER — Other Ambulatory Visit: Payer: Self-pay | Admitting: Cardiology

## 2013-01-21 ENCOUNTER — Other Ambulatory Visit: Payer: Self-pay | Admitting: Cardiology

## 2013-01-24 ENCOUNTER — Other Ambulatory Visit: Payer: Self-pay | Admitting: *Deleted

## 2013-01-24 MED ORDER — ATENOLOL 25 MG PO TABS: ORAL_TABLET | ORAL | Status: DC

## 2013-01-29 ENCOUNTER — Telehealth: Payer: Self-pay | Admitting: Cardiology

## 2013-01-29 NOTE — Telephone Encounter (Signed)
Since her BP is good, would favor continuing current regimen unless her BP is dropping too low (SBP < 100) or she has side effects from atenolol.

## 2013-01-29 NOTE — Telephone Encounter (Signed)
Pt advised, verbalized understanding.  She declined to make an appt here in follow up. Pt said she would follow up with Dr Dayton Martes about BP and refills.

## 2013-01-29 NOTE — Telephone Encounter (Signed)
To close encounter

## 2013-01-29 NOTE — Telephone Encounter (Signed)
Spoke with patient. Pt asking if she can stop atenolol. She did not have specific BP readings but said it had been good (118/?)recently. She wants to continue HCTZ. Last OV here October 2012 with Sunday Spillers. She did not want to make a follow up appt here. She states she sees Dr  Dayton Martes at [facility] and can follow up with her about her BP and HCTZ refills. I will forward to Dr Shirlee Latch for review.

## 2013-01-29 NOTE — Telephone Encounter (Signed)
New problem     Would like to discuss coming off medication  Atenolol  25 mg .

## 2013-02-19 ENCOUNTER — Encounter: Payer: Self-pay | Admitting: Family Medicine

## 2013-02-19 ENCOUNTER — Ambulatory Visit (INDEPENDENT_AMBULATORY_CARE_PROVIDER_SITE_OTHER): Payer: BC Managed Care – PPO | Admitting: Family Medicine

## 2013-02-19 VITALS — BP 110/60 | HR 60 | Temp 98.1°F | Wt 197.0 lb

## 2013-02-19 DIAGNOSIS — J019 Acute sinusitis, unspecified: Secondary | ICD-10-CM

## 2013-02-19 MED ORDER — ATENOLOL 25 MG PO TABS: ORAL_TABLET | ORAL | Status: DC

## 2013-02-19 MED ORDER — AZITHROMYCIN 250 MG PO TABS: ORAL_TABLET | ORAL | Status: DC

## 2013-02-19 NOTE — Patient Instructions (Addendum)
Take Zpack as directed.  Drink lots of fluids.    Treat sympotmatically with Mucinex, nasal saline irrigation, and Tylenol/Ibuprofen.  Try over the counter nasocort-start with 2 sprays per nostril per day...and then try to taper to 1 spray per nostril once symptoms improve.   You can use warm compresses.     Call if not improving as expected in 5-7 days.    

## 2013-02-19 NOTE — Progress Notes (Signed)
SUBJECTIVE:  Nicole Daniels is a 62 y.o. female who complains of coryza, congestion, sneezing, sore throat and bilateral sinus pain for 8 days. She denies a history of anorexia, chest pain, chills and fevers and denies a history of asthma. Patient denies smoke cigarettes.   Patient Active Problem List  Diagnosis  . UNSPECIFIED VITAMIN D DEFICIENCY  . HYPERLIPIDEMIA  . OBESITY  . DEPRESSION, MILD  . SINUSITIS - ACUTE-NOS  . SINUSITIS, CHRONIC  . RENAL CYST  . PATENT FORAMEN OVALE  . NEOPLASM, MALIGNANT, SKIN, FACE  . MIGRAINE, CHRONIC  . SHORTNESS OF BREATH  . CHEST PAIN  . HYPERTENSION, UNSPECIFIED   Past Medical History  Diagnosis Date  . Chest pain, unspecified   . Dizziness and giddiness   . Dysfunction of eustachian tube   . Other malaise and fatigue   . Other and unspecified hyperlipidemia   . Unspecified essential hypertension   . Migraine, unspecified, without mention of intractable migraine without mention of status migrainosus   . Obesity, unspecified   . Neoplasm of skin of face     malignant  . Palpitations   . Ostium secundum type atrial septal defect   . Abdominal pain, right lower quadrant   . Shortness of breath   . Acute sinusitis, unspecified   . Unspecified sinusitis (chronic)   . Unspecified vitamin D deficiency   . OA (osteoarthritis)     knee  . Angiomyolipoma(M8860/0)     kidney  . Liver lesion 2011  . Diverticulosis   . Adenomatous colon polyp   . IBS (irritable bowel syndrome)   . History of diverticulitis of colon   . Hemorrhoids   . Acquired cyst of kidney 01/08/2012    pt denies history of   . Depressive disorder, not elsewhere classified 01/08/2012    not currently    Past Surgical History  Procedure Laterality Date  . Partial hysterectomy      right ovary remains  . Dilation and curettage of uterus    . Cholecystectomy    . Breast reduction surgery  1990  . Tonsillectomy  1972  . Left oophorectomy  1976  . Knee  arthrotomy      right  . Abdominal hysterectomy    . Flexible sigmoidoscopy  01/08/2012    Procedure: FLEXIBLE SIGMOIDOSCOPY;  Surgeon: Hart Carwin, MD;  Location: [facility];  Service: Endoscopy;  Laterality: N/A;   History  Substance Use Topics  . Smoking status: Never Smoker   . Smokeless tobacco: Never Used  . Alcohol Use: No   Family History  Problem Relation Age of Onset  . Breast cancer Cousin   . Cirrhosis Father   . Hypertension Mother   . Hypertension Sister   . Diabetes Mother   . Diabetes Cousin   . Diabetes      aunt  . Stroke      aunt  . Heart disease Father   . Heart disease Brother     x 2  . Colon cancer Neg Hx    Allergies  Allergen Reactions  . Simvastatin Other (See Comments)    Leg pain   Current Outpatient Prescriptions on File Prior to Visit  Medication Sig Dispense Refill  . aspirin 81 MG tablet Take 81 mg by mouth daily.        . fluticasone (FLONASE) 50 MCG/ACT nasal spray Place 2 sprays into the nose daily.  16 g  6  . hydrochlorothiazide (HYDRODIURIL) 25 MG tablet TAKE  1 TABLET BY MOUTH EVERY DAY  30 tablet  3  . nortriptyline (PAMELOR) 10 MG capsule Take 20 mg by mouth at bedtime.        No current facility-administered medications on file prior to visit.   The PMH, PSH, Social History, Family History, Medications, and allergies have been reviewed in [facility], and have been updated if relevant.  OBJECTIVE: BP 110/60  Pulse 60  Temp(Src) 98.1 F (36.7 C)  Wt 197 lb (89.359 kg)  BMI 32.78 kg/m2  She appears well, vital signs are as noted. Ears normal.  Throat and pharynx normal.  Neck supple. No adenopathy in the neck. Nose is congested. Sinuses  tender. The chest is clear, without wheezes or rales.  ASSESSMENT:  sinusitis  PLAN: Given duration and progression of symptoms, will treat for bacterial sinusitis. Symptomatic therapy suggested: push fluids, rest and return office visit prn if symptoms persist or worsen.. Call or return  to clinic prn if these symptoms worsen or fail to improve as anticipated.

## 2013-03-08 ENCOUNTER — Encounter (HOSPITAL_COMMUNITY): Payer: Self-pay | Admitting: Physical Medicine and Rehabilitation

## 2013-03-08 ENCOUNTER — Observation Stay (HOSPITAL_COMMUNITY)
Admission: EM | Admit: 2013-03-08 | Discharge: 2013-03-09 | Disposition: A | Discharge status: Home / Self Care | Payer: BC Managed Care – PPO | Attending: Internal Medicine | Admitting: Internal Medicine

## 2013-03-08 ENCOUNTER — Emergency Department (HOSPITAL_COMMUNITY): Payer: BC Managed Care – PPO

## 2013-03-08 DIAGNOSIS — R079 Chest pain, unspecified: Secondary | ICD-10-CM

## 2013-03-08 DIAGNOSIS — J329 Chronic sinusitis, unspecified: Secondary | ICD-10-CM

## 2013-03-08 DIAGNOSIS — N281 Cyst of kidney, acquired: Secondary | ICD-10-CM

## 2013-03-08 DIAGNOSIS — E66811 Obesity, class 1: Secondary | ICD-10-CM | POA: Diagnosis present

## 2013-03-08 DIAGNOSIS — E669 Obesity, unspecified: Secondary | ICD-10-CM

## 2013-03-08 DIAGNOSIS — E559 Vitamin D deficiency, unspecified: Secondary | ICD-10-CM

## 2013-03-08 DIAGNOSIS — E782 Mixed hyperlipidemia: Secondary | ICD-10-CM | POA: Diagnosis present

## 2013-03-08 DIAGNOSIS — Q211 Atrial septal defect: Secondary | ICD-10-CM

## 2013-03-08 DIAGNOSIS — R0602 Shortness of breath: Secondary | ICD-10-CM | POA: Insufficient documentation

## 2013-03-08 DIAGNOSIS — I10 Essential (primary) hypertension: Secondary | ICD-10-CM | POA: Diagnosis present

## 2013-03-08 DIAGNOSIS — E785 Hyperlipidemia, unspecified: Secondary | ICD-10-CM | POA: Insufficient documentation

## 2013-03-08 DIAGNOSIS — C44300 Unspecified malignant neoplasm of skin of unspecified part of face: Secondary | ICD-10-CM

## 2013-03-08 DIAGNOSIS — F329 Major depressive disorder, single episode, unspecified: Secondary | ICD-10-CM

## 2013-03-08 DIAGNOSIS — J019 Acute sinusitis, unspecified: Secondary | ICD-10-CM

## 2013-03-08 DIAGNOSIS — G43909 Migraine, unspecified, not intractable, without status migrainosus: Secondary | ICD-10-CM

## 2013-03-08 DIAGNOSIS — Z6832 Body mass index (BMI) 32.0-32.9, adult: Secondary | ICD-10-CM | POA: Insufficient documentation

## 2013-03-08 LAB — CBC
HCT: 39.1 % (ref 36.0–46.0)
Hemoglobin: 13.5 g/dL (ref 12.0–15.0)
MCH: 31.7 pg (ref 26.0–34.0)
MCV: 91.8 fL (ref 78.0–100.0)
RBC: 4.26 MIL/uL (ref 3.87–5.11)

## 2013-03-08 LAB — BASIC METABOLIC PANEL
GFR calc Af Amer: 78 mL/min — ABNORMAL LOW (ref >90)
GFR calc non Af Amer: 68 mL/min — ABNORMAL LOW (ref >90)
Potassium: 3.1 mEq/L — ABNORMAL LOW (ref 3.5–5.1)
Sodium: 141 mEq/L (ref 135–145)

## 2013-03-08 LAB — CBC WITH DIFFERENTIAL/PLATELET
Basophils Relative: 0 % (ref 0–1)
Eosinophils Absolute: 0.2 10*3/uL (ref 0.0–0.7)
MCH: 32 pg (ref 26.0–34.0)
MCHC: 35.1 g/dL (ref 30.0–36.0)
Neutrophils Relative %: 57 % (ref 43–77)
Platelets: 373 10*3/uL (ref 150–400)
RBC: 4.44 MIL/uL (ref 3.87–5.11)

## 2013-03-08 LAB — POCT I-STAT TROPONIN I: Troponin i, poc: 0 ng/mL (ref 0.00–0.08)

## 2013-03-08 LAB — MAGNESIUM: Magnesium: 2 mg/dL (ref 1.5–2.5)

## 2013-03-08 LAB — TROPONIN I: Troponin I: <0.3 ng/mL (ref <0.30)

## 2013-03-08 LAB — CK TOTAL AND CKMB (NOT AT ARMC)
CK, MB: 1.6 ng/mL (ref 0.3–4.0)
Total CK: 89 U/L (ref 7–177)

## 2013-03-08 LAB — D-DIMER, QUANTITATIVE: D-Dimer, Quant: <0.27 ug/mL-FEU (ref 0.00–0.48)

## 2013-03-08 MED ORDER — HYDROCHLOROTHIAZIDE 25 MG PO TABS
25.0000 mg | ORAL_TABLET | Freq: Every day | ORAL | Status: DC
  Filled 2013-03-08 (×2): qty 1

## 2013-03-08 MED ORDER — MORPHINE SULFATE 2 MG/ML IJ SOLN: 1.0000 mg | INTRAMUSCULAR | Status: DC | PRN

## 2013-03-08 MED ORDER — ONDANSETRON HCL 4 MG/2ML IJ SOLN: 4.0000 mg | Freq: Four times a day (QID) | INTRAMUSCULAR | Status: DC | PRN

## 2013-03-08 MED ORDER — ASPIRIN EC 81 MG PO TBEC
81.0000 mg | DELAYED_RELEASE_TABLET | Freq: Every day | ORAL | Status: DC
  Filled 2013-03-08: qty 1

## 2013-03-08 MED ORDER — ENOXAPARIN SODIUM 40 MG/0.4ML ~~LOC~~ SOLN
40.0000 mg | SUBCUTANEOUS | Status: DC
  Administered 2013-03-09: 40 mg via SUBCUTANEOUS
  Filled 2013-03-08 (×2): qty 0.4

## 2013-03-08 MED ORDER — ACETAMINOPHEN 650 MG RE SUPP: 650.0000 mg | Freq: Four times a day (QID) | RECTAL | Status: DC | PRN

## 2013-03-08 MED ORDER — NORTRIPTYLINE HCL 25 MG PO CAPS
75.0000 mg | ORAL_CAPSULE | Freq: Every day | ORAL | Status: DC
Start: 2013-03-08 — End: 2013-03-09
  Administered 2013-03-08: 75 mg via ORAL
  Filled 2013-03-08 (×2): qty 3

## 2013-03-08 MED ORDER — ASPIRIN 81 MG PO TABS: 81.0000 mg | ORAL_TABLET | Freq: Every day | ORAL | Status: DC

## 2013-03-08 MED ORDER — HYDROCODONE-ACETAMINOPHEN 5-325 MG PO TABS
1.0000 | ORAL_TABLET | ORAL | Status: DC | PRN
  Administered 2013-03-09: 1 via ORAL
  Filled 2013-03-08: qty 1

## 2013-03-08 MED ORDER — ALUM & MAG HYDROXIDE-SIMETH 200-200-20 MG/5ML PO SUSP: 30.0000 mL | Freq: Four times a day (QID) | ORAL | Status: DC | PRN

## 2013-03-08 MED ORDER — POTASSIUM CHLORIDE 10 MEQ/100ML IV SOLN
10.0000 meq | INTRAVENOUS | Status: AC
  Administered 2013-03-08 (×2): 10 meq via INTRAVENOUS
  Filled 2013-03-08 (×2): qty 100

## 2013-03-08 MED ORDER — ATENOLOL 25 MG PO TABS
25.0000 mg | ORAL_TABLET | Freq: Every day | ORAL | Status: DC
  Administered 2013-03-09: 25 mg via ORAL
  Filled 2013-03-08: qty 1

## 2013-03-08 MED ORDER — SODIUM CHLORIDE 0.9 % IJ SOLN
3.0000 mL | Freq: Two times a day (BID) | INTRAMUSCULAR | Status: DC
  Administered 2013-03-08 – 2013-03-09 (×2): 3 mL via INTRAVENOUS

## 2013-03-08 MED ORDER — MORPHINE SULFATE 2 MG/ML IJ SOLN: 2.0000 mg | Freq: Once | INTRAMUSCULAR | Status: DC

## 2013-03-08 MED ORDER — NITROGLYCERIN 0.2 MG/HR TD PT24: 0.2000 mg | MEDICATED_PATCH | Freq: Every day | TRANSDERMAL | Status: DC

## 2013-03-08 MED ORDER — ASPIRIN EC 325 MG PO TBEC
325.0000 mg | DELAYED_RELEASE_TABLET | Freq: Once | ORAL | Status: AC
  Administered 2013-03-08: 325 mg via ORAL
  Filled 2013-03-08: qty 1

## 2013-03-08 MED ORDER — ACETAMINOPHEN 325 MG PO TABS
650.0000 mg | ORAL_TABLET | Freq: Four times a day (QID) | ORAL | Status: DC | PRN
  Administered 2013-03-08: 650 mg via ORAL
  Filled 2013-03-08: qty 2

## 2013-03-08 MED ORDER — NITROGLYCERIN 0.2 MG/HR TD PT24
0.2000 mg | MEDICATED_PATCH | Freq: Every day | TRANSDERMAL | Status: DC
  Administered 2013-03-08: 0.2 mg via TRANSDERMAL
  Filled 2013-03-08 (×2): qty 1

## 2013-03-08 MED ORDER — ONDANSETRON HCL 4 MG PO TABS: 4.0000 mg | ORAL_TABLET | Freq: Four times a day (QID) | ORAL | Status: DC | PRN

## 2013-03-08 MED ORDER — SODIUM CHLORIDE 0.9 % IV SOLN
INTRAVENOUS | Status: DC
  Administered 2013-03-08: 22:00:00 via INTRAVENOUS

## 2013-03-08 NOTE — Consult Note (Signed)
Reason for Consult: chest pain Referring Physician: Dr. Isidoro Donning - [facility]  Nicole Daniels is an 62 y.o. female.  HPI: Ms. Madl is a 62 yo woman with PMH of hypertension, dyslipidemia (no current statin given cramps previously according to patient) and hysterectomy in her 30s (with one ovary remaining), unknown date of menopause who did some furniture lifting/moving last weekend and had her first episode of chest pain last Monday. She described the pain on Monday as tender sensation, maybe pressure, substernal and brought on by twisting/turning. The pain lasted at most seconds to a few minutes. No pain again until Friday (yesterday) when she had pain for a few times for a few minutes worse with position changes, occuring at rest watching TV. She was able to do a significant amount of walking Thursday - probably in excess of 1 mile and she has no exercise intolerance. However, today, Saturday, date of admission, she felt the pain intermittently throughout the day at rest, with walking and moving around. However, she does resolve the pain if she stops moving and rests without twisting and turning. Her pain really was bothersome while running errands and driving today between 47-8 pm. Her pain is most resolved unless she pushes on the spot or moves around in bed. She had a large aspirin in the ER and NTG patch which has given her a headache. No SOB, no neck/jaw pain, no syncope or palpitations.   Past Medical History  Diagnosis Date  . Chest pain, unspecified   . Dizziness and giddiness   . Dysfunction of eustachian tube   . Other malaise and fatigue   . Other and unspecified hyperlipidemia   . Unspecified essential hypertension   . Migraine, unspecified, without mention of intractable migraine without mention of status migrainosus   . Obesity, unspecified   . Neoplasm of skin of face     malignant  . Palpitations   . Ostium secundum type atrial septal defect   . Abdominal pain,  right lower quadrant   . Shortness of breath   . Acute sinusitis, unspecified   . Unspecified sinusitis (chronic)   . Unspecified vitamin D deficiency   . OA (osteoarthritis)     knee  . Angiomyolipoma(M8860/0)     kidney  . Liver lesion 2011  . Diverticulosis   . Adenomatous colon polyp   . IBS (irritable bowel syndrome)   . History of diverticulitis of colon   . Hemorrhoids   . Acquired cyst of kidney 01/08/2012    pt denies history of   . Depressive disorder, not elsewhere classified 01/08/2012    not currently     Past Surgical History  Procedure Laterality Date  . Partial hysterectomy      right ovary remains  . Dilation and curettage of uterus    . Cholecystectomy    . Breast reduction surgery  1990  . Tonsillectomy  1972  . Left oophorectomy  1976  . Knee arthrotomy      right  . Abdominal hysterectomy    . Flexible sigmoidoscopy  01/08/2012    Procedure: FLEXIBLE SIGMOIDOSCOPY;  Surgeon: Hart Carwin, MD;  Location: [facility];  Service: Endoscopy;  Laterality: N/A;    Family History  Problem Relation Age of Onset  . Breast cancer Cousin   . Cirrhosis Father   . Hypertension Mother   . Hypertension Sister   . Diabetes Mother   . Diabetes Cousin   . Diabetes      aunt  .  Stroke      aunt  . Heart disease Father   . Heart disease Brother     x 2  . Colon cancer Neg Hx     Social History:  reports that she has never smoked. She has never used smokeless tobacco. She reports that she does not drink alcohol or use illicit drugs.  Allergies:  Allergies  Allergen Reactions  . Simvastatin Other (See Comments)    Leg pain    Medications:  I have reviewed the patient's current medications. Prior to Admission:  Prescriptions prior to admission  Medication Sig Dispense Refill  . aspirin 81 MG tablet Take 81 mg by mouth daily.        Marland Kitchen atenolol (TENORMIN) 25 MG tablet Take 25 mg by mouth daily.      . hydrochlorothiazide (HYDRODIURIL) 25 MG tablet  Take 25 mg by mouth at bedtime.      . nortriptyline (PAMELOR) 25 MG capsule Take 75 mg by mouth at bedtime.       Scheduled: . [START ON 03/09/2013] aspirin EC  81 mg Oral Daily  . [START ON 03/09/2013] atenolol  25 mg Oral Daily  . [START ON 03/09/2013] enoxaparin (LOVENOX) injection  40 mg Subcutaneous Q24H  . hydrochlorothiazide  25 mg Oral QHS  .  morphine injection  2 mg Intravenous Once  . nitroGLYCERIN  0.2 mg Transdermal Daily  . nortriptyline  75 mg Oral QHS  . sodium chloride  3 mL Intravenous Q12H    Results for orders placed during the hospital encounter of 03/08/13 (from the past 48 hour(s))  CBC WITH DIFFERENTIAL     Status: None   Collection Time    03/08/13  4:54 PM      Result Value Range   WBC 8.7  4.0 - 10.5 K/uL   RBC 4.44  3.87 - 5.11 MIL/uL   Hemoglobin 14.2  12.0 - 15.0 g/dL   HCT 32.4  40.1 - 02.7 %   MCV 91.0  78.0 - 100.0 fL   MCH 32.0  26.0 - 34.0 pg   MCHC 35.1  30.0 - 36.0 g/dL   RDW 25.3  66.4 - 40.3 %   Platelets 373  150 - 400 K/uL   Neutrophils Relative 57  43 - 77 %   Neutro Abs 4.9  1.7 - 7.7 K/uL   Lymphocytes Relative 34  12 - 46 %   Lymphs Abs 3.0  0.7 - 4.0 K/uL   Monocytes Relative 6  3 - 12 %   Monocytes Absolute 0.6  0.1 - 1.0 K/uL   Eosinophils Relative 3  0 - 5 %   Eosinophils Absolute 0.2  0.0 - 0.7 K/uL   Basophils Relative 0  0 - 1 %   Basophils Absolute 0.0  0.0 - 0.1 K/uL  BASIC METABOLIC PANEL     Status: Abnormal   Collection Time    03/08/13  4:54 PM      Result Value Range   Sodium 141  135 - 145 mEq/L   Potassium 3.1 (*) 3.5 - 5.1 mEq/L   Chloride 102  96 - 112 mEq/L   CO2 27  19 - 32 mEq/L   Glucose, Bld 100 (*) 70 - 99 mg/dL   BUN 14  6 - 23 mg/dL   Creatinine, Ser 4.74  0.50 - 1.10 mg/dL   Calcium 9.6  8.4 - 25.9 mg/dL   GFR calc non Af Amer 68 (*) >90 mL/min  GFR calc Af Amer 78 (*) >90 mL/min   Comment:            The eGFR has been calculated     using the CKD EPI equation.     This calculation has not  been     validated in all clinical     situations.     eGFR's persistently     <90 mL/min signify     possible Chronic Kidney Disease.  POCT I-STAT TROPONIN I     Status: None   Collection Time    03/08/13  5:04 PM      Result Value Range   Troponin i, poc 0.00  0.00 - 0.08 ng/mL   Comment 3            Comment: Due to the release kinetics of cTnI,     a negative result within the first hours     of the onset of symptoms does not rule out     myocardial infarction with certainty.     If myocardial infarction is still suspected,     repeat the test at appropriate intervals.  D-DIMER, QUANTITATIVE     Status: None   Collection Time    03/08/13  7:29 PM      Result Value Range   D-Dimer, Quant <0.27  0.00 - 0.48 ug/mL-FEU   Comment:            AT THE INHOUSE ESTABLISHED CUTOFF     VALUE OF 0.48 ug/mL FEU,     THIS ASSAY HAS BEEN DOCUMENTED     IN THE LITERATURE TO HAVE     A SENSITIVITY AND NEGATIVE     PREDICTIVE VALUE OF AT LEAST     98 TO 99%.  THE TEST RESULT     SHOULD BE CORRELATED WITH     AN ASSESSMENT OF THE CLINICAL     PROBABILITY OF DVT / VTE.    Dg Chest 2 View  03/08/2013  *RADIOLOGY REPORT*  Clinical Data: Midsternal chest pain  CHEST - 2 VIEW  Comparison: 07/27/2008  Findings: Nipple shadow incidentally projects over the lung bases. The heart size is normal and the lungs are clear.  No pleural effusion. Cholecystectomy clips noted.  No acute osseous finding.  IMPRESSION: No acute cardiopulmonary process.   Original Report Authenticated By: Christiana Pellant, M.D.     Review of Systems  Constitutional: Negative for fever, chills, weight loss and malaise/fatigue.  HENT: Negative for hearing loss, ear pain, neck pain and tinnitus.   Eyes: Negative for blurred vision, double vision and photophobia.  Respiratory: Negative for cough, hemoptysis and sputum production.   Cardiovascular: Positive for chest pain. Negative for palpitations, orthopnea, claudication and leg  swelling.  Gastrointestinal: Negative for heartburn, nausea, vomiting and abdominal pain.  Genitourinary: Negative for dysuria, urgency and frequency.  Musculoskeletal: Negative for myalgias and back pain.  Skin: Negative for itching and rash.  Neurological: Positive for headaches. Negative for dizziness, tingling and tremors.  Endo/Heme/Allergies: Negative for environmental allergies and polydipsia. Does not bruise/bleed easily.  Psychiatric/Behavioral: Negative for depression, suicidal ideas, hallucinations and substance abuse.   Blood pressure 141/58, pulse 84, temperature 98.2 F (36.8 C), temperature source Oral, resp. rate 18, height 5\' 5"  (1.651 m), weight 89.585 kg (197 lb 8 oz), SpO2 97.00%. Physical Exam  Nursing note and vitals reviewed. Constitutional: She is oriented to person, place, and time. She appears well-developed and well-nourished. No distress.  HENT:  Head: Normocephalic  and atraumatic.  Nose: Nose normal.  Mouth/Throat: Oropharynx is clear and moist. No oropharyngeal exudate.  Eyes: Conjunctivae and EOM are normal. Pupils are equal, round, and reactive to light. No scleral icterus.  Neck: Normal range of motion. Neck supple. No JVD present. No tracheal deviation present. No thyromegaly present.  Cardiovascular: Normal rate, regular rhythm, normal heart sounds and intact distal pulses.  Exam reveals no gallop.   No murmur heard. Respiratory: Effort normal and breath sounds normal. No respiratory distress. She has no wheezes. She has no rales.  GI: Soft. Bowel sounds are normal. She exhibits no distension. There is no tenderness. There is no rebound.  Musculoskeletal: Normal range of motion. She exhibits no edema and no tenderness.  Neurological: She is alert and oriented to person, place, and time. She has normal reflexes. No cranial nerve deficit. Coordination normal.  Skin: Skin is warm and dry. No rash noted. She is not diaphoretic. No erythema.  Psychiatric: She  has a normal mood and affect. Her behavior is normal.  labs reviewed; na 141, K 3.1, bun/cr 14/0.9, troponin 0.00 ECG reviewed; inferior q, nl axis/rate, LVH, NSR 4/06 TEE reviewed (results) and EF 55-65%, + bubble study  Problem List Chest Pain Hypertension Dyslipidemia Hypokalemia + bubble study in the past Assessment/Plan: 62 yo woman with hypertension, dyslipidemia who has had chest pain intermittent since moving furniture last weekend. Differential diagnosis for her chest pain is musculoskeletal, ACS, angina, unstable angina, atypical chest pain, pneumothorax, PNA among other etiologies. By history she has a musculoskeletal origin of her chest pain given positional nature of her pain and reproducible pain. She currently is chest pain free if she doesn't move side-to-side and her initial troponin is negative. She has received NTG but I'm unsure if this relieved her pain although she has gotten a headache. At this juncture, I favor continued monitoring on telemetry, trend troponins and likely stress test if unrevealing. If chest pain becomes unrelenting and anginal in nature or troponins become positive then will add on heparin and treat for NSTEMI.  - trend troponins, telemetry - bb seems reasonable - large asa in Er and daily now - replete potassium - Echo in AM reasonable to assist with stress test timing - tylenol or hydrocodone for pain to see if this improves - Please call with any questions or changes in clinical status  Krystol Rocco 03/08/2013, 9:00 PM

## 2013-03-08 NOTE — ED Notes (Signed)
Patient transported to X-ray 

## 2013-03-08 NOTE — H&P (Signed)
History and Physical       Hospital Admission Note Date: 03/08/2013  Patient name: Nicole Daniels Medical record number: [id number] Date of birth: January 27, 1951 Age: 62 y.o. Gender: female PCP: Ruthe Mannan, MD    Chief Complaint:  Chest pain with slight shortness of breath  HPI: Patient is a 62 year old female with past medical history of hypertension, hyperlipidemia, obesity, recent sinusitis presented to ED with chest pain. Patient reported that her chest pain episode started on Monday, 5 days ago. She had intermittent episodes and resolved spontaneously not related to any exertion at that time. Over the next 4 days she had only isolated 1 or 2 episodes, she did not come to the ER as they were spontaneously resolved. However today patient was shopping when she suddenly had started having chest pain around 10 AM, associated with slight shortness of breath, nausea and diaphoresis. Patient's chest pain did not improve hence she presented to the ED. She also stated that bending in a certain way also makes it has been worse. In ED, she received only one aspirin, her chest pain has somewhat improved. She also has noticed exertional dyspnea lately.  Review of Systems:  Constitutional: Denies fever, chills, diaphoresis, poor appetite and fatigue.  HEENT: Denies photophobia, eye pain, redness, hearing loss, ear pain, congestion, sore throat, rhinorrhea, sneezing, mouth sores, trouble swallowing, neck pain, neck stiffness and tinnitus.   Respiratory: Denies coughing  and wheezing.   Cardiovascular: Please see history of present illness Gastrointestinal: Denies  vomiting, abdominal pain, diarrhea, constipation, blood in stool and abdominal distention + nausea.  Genitourinary: Denies dysuria, urgency, frequency, hematuria, flank pain and difficulty urinating.  Musculoskeletal: Denies myalgias, back pain, joint swelling, arthralgias and gait problem.   Skin: Denies pallor, rash and wound.  Neurological: Denies dizziness, seizures, syncope, weakness, light-headedness, numbness and headaches.  Hematological: Denies adenopathy. Easy bruising, personal or family bleeding history  Psychiatric/Behavioral: Denies suicidal ideation, mood changes, confusion, nervousness, sleep disturbance and agitation  Past Medical History: Past Medical History  Diagnosis Date  . Chest pain, unspecified   . Dizziness and giddiness   . Dysfunction of eustachian tube   . Other malaise and fatigue   . Other and unspecified hyperlipidemia   . Unspecified essential hypertension   . Migraine, unspecified, without mention of intractable migraine without mention of status migrainosus   . Obesity, unspecified   . Neoplasm of skin of face     malignant  . Palpitations   . Ostium secundum type atrial septal defect   . Abdominal pain, right lower quadrant   . Shortness of breath   . Acute sinusitis, unspecified   . Unspecified sinusitis (chronic)   . Unspecified vitamin D deficiency   . OA (osteoarthritis)     knee  . Angiomyolipoma(M8860/0)     kidney  . Liver lesion 2011  . Diverticulosis   . Adenomatous colon polyp   . IBS (irritable bowel syndrome)   . History of diverticulitis of colon   . Hemorrhoids   . Acquired cyst of kidney 01/08/2012    pt denies history of   . Depressive disorder, not elsewhere classified 01/08/2012    not currently    Past Surgical History  Procedure Laterality Date  . Partial hysterectomy      right ovary remains  . Dilation and curettage of uterus    . Cholecystectomy    . Breast reduction surgery  1990  . Tonsillectomy  1972  . Left oophorectomy  1976  .  Knee arthrotomy      right  . Abdominal hysterectomy    . Flexible sigmoidoscopy  01/08/2012    Procedure: FLEXIBLE SIGMOIDOSCOPY;  Surgeon: Hart Carwin, MD;  Location: [facility];  Service: Endoscopy;  Laterality: N/A;    Medications: Prior to Admission  medications   Medication Sig Start Date End Date Taking? Authorizing Provider  aspirin 81 MG tablet Take 81 mg by mouth daily.     Yes Historical Provider, MD  atenolol (TENORMIN) 25 MG tablet Take 25 mg by mouth daily.   Yes Historical Provider, MD  hydrochlorothiazide (HYDRODIURIL) 25 MG tablet Take 25 mg by mouth at bedtime.   Yes Historical Provider, MD  nortriptyline (PAMELOR) 25 MG capsule Take 75 mg by mouth at bedtime.   Yes Historical Provider, MD    Allergies:   Allergies  Allergen Reactions  . Simvastatin Other (See Comments)    Leg pain    Social History:  reports that she has never smoked. She has never used smokeless tobacco. She reports that she does not drink alcohol or use illicit drugs. she lives at home and is functional with her ADLs  Family History: Family History  Problem Relation Age of Onset  . Breast cancer Cousin   . Cirrhosis Father   . Hypertension Mother   . Hypertension Sister   . Diabetes Mother   . Diabetes Cousin   . Diabetes      aunt  . Stroke      aunt  . Heart disease Father   . Heart disease Brother     x 2  . Colon cancer Neg Hx     Physical Exam: Blood pressure 150/86, pulse 87, temperature 98.7 F (37.1 C), temperature source Oral, resp. rate 23, SpO2 93.00%. General: Alert, awake, oriented x3, in no acute distress. HEENT: normocephalic, atraumatic, anicteric sclera, pink conjunctiva, pupils equal and reactive to light and accomodation, oropharynx clear Neck: supple, no masses or lymphadenopathy, no goiter, no bruits  Heart: Regular rate and rhythm, without murmurs, rubs or gallops. Lungs: Clear to auscultation bilaterally, no wheezing, rales or rhonchi. Some chest wall tenderness to deep palpation however patient states "its sore"  Abdomen: obese, Soft, nontender, nondistended, positive bowel sounds, no masses. Extremities: No clubbing, cyanosis or edema with positive pedal pulses. Neuro: Grossly intact, no focal neurological  deficits, strength 5/5 upper and lower extremities bilaterally Psych: alert and oriented x 3, normal mood and affect Skin: no rashes or lesions, warm and dry   LABS on Admission:  Basic Metabolic Panel:  Recent Labs Lab 03/08/13 1654  NA 141  K 3.1*  CL 102  CO2 27  GLUCOSE 100*  BUN 14  CREATININE 0.89  CALCIUM 9.6   CBC:  Recent Labs Lab 03/08/13 1654  WBC 8.7  NEUTROABS 4.9  HGB 14.2  HCT 40.4  MCV 91.0  PLT 373   Cardiac Enzymes: No results found for this basename: CKTOTAL, CKMB, CKMBINDEX, TROPONINI,  in the last 168 hours BNP: No components found with this basename: POCBNP,  CBG: No results found for this basename: GLUCAP,  in the last 168 hours   Radiological Exams on Admission: Dg Chest 2 View  03/08/2013  *RADIOLOGY REPORT*  Clinical Data: Midsternal chest pain  CHEST - 2 VIEW  Comparison: 07/27/2008  Findings: Nipple shadow incidentally projects over the lung bases. The heart size is normal and the lungs are clear.  No pleural effusion. Cholecystectomy clips noted.  No acute osseous finding.  IMPRESSION:  No acute cardiopulmonary process.   Original Report Authenticated By: Christiana Pellant, M.D.    DG independently reviewed showed rate of 94, normal sinus rhythm, LVH criteria, T-wave inversion in lead III  Assessment/Plan Principal Problem:   CHEST PAIN: Is somewhat concerning for angina with risk factors of hypertension, hyperlipidemia, obesity, no recent cardiac workup.  - Admit to telemetry, start on aspirin, nitroglycerin patch, beta blocker, morphine. Patient is allergic to statins. - Obtain 3 sets of cardiac enzymes, d-dimer for further workup. If troponin is positive, we'll start on heparin drip. - Obtain 2-D echocardiogram. - Obtained cardiology consult, discussed with Dr. Tresa Endo, will defer to cardiology recommendations regarding nuclear medicine stress test -Inpatient versus outpatient.   Active Problems:   HYPERLIPIDEMIA - Obtain lipid panel      OBESITY - Patient was counseled on diet and weight control     HYPERTENSION, UNSPECIFIED - Continue atenolol, HCTZ  DVT prophylaxis: Lovenox  CODE STATUS: Full code  Further plan will depend as patient's clinical course evolves and further radiologic and laboratory data become available.   Time Spent on Admission: 45 minutes  RAI,RIPUDEEP M.D. [facility] 03/08/2013, 6:59 PM Pager: [phone]  If 7PM-7AM, please contact night-coverage [email or web address] Password [email or web address]

## 2013-03-08 NOTE — ED Notes (Signed)
Called for heart healthy diet.

## 2013-03-08 NOTE — ED Provider Notes (Signed)
History     CSN: [id number]  Arrival date & time 03/08/13  1548   First MD Initiated Contact with Patient 03/08/13 1609      Chief Complaint  Patient presents with  . Chest Pain  . Shortness of Breath    (Consider location/radiation/quality/duration/timing/severity/associated sxs/prior treatment) HPI Comments: 62 y.o. female who has pmh of htn presents to the Er w/ the cc of chest pain. She states that her symptoms started 5 days ago -- she states she was ambulating and then felt left sided chest pain with nausea and diaphoresis -- states these symptoms were intermittent, and resolved on their own eventually. No fevers associated w/ this or cough. She states that today she noticed same symptoms -- initially did not want to come to the Er as they resolved after a few minutes, but then when she was ambulating they kept recurring -- and she was having diaphoresis and nausea w/ these symptoms. In our Er she feels her symptoms are "almost completely gone".   Patient is a 62 y.o. female presenting with general illness. The history is provided by the patient.  Illness  The current episode started 3 to 5 days ago. The onset was gradual. The problem occurs rarely. The problem has been unchanged. The problem is moderate. Nothing relieves the symptoms. Nothing aggravates the symptoms. Associated symptoms include nausea. Pertinent negatives include no fever, no eye itching, no abdominal pain, no diarrhea, no vomiting, no headaches, no stridor, no neck pain, no cough, no wheezing, no rash, no eye discharge and no eye pain.    Past Medical History  Diagnosis Date  . Chest pain, unspecified   . Dizziness and giddiness   . Dysfunction of eustachian tube   . Other malaise and fatigue   . Other and unspecified hyperlipidemia   . Unspecified essential hypertension   . Migraine, unspecified, without mention of intractable migraine without mention of status migrainosus   . Obesity, unspecified   .  Neoplasm of skin of face     malignant  . Palpitations   . Ostium secundum type atrial septal defect   . Abdominal pain, right lower quadrant   . Shortness of breath   . Acute sinusitis, unspecified   . Unspecified sinusitis (chronic)   . Unspecified vitamin D deficiency   . OA (osteoarthritis)     knee  . Angiomyolipoma(M8860/0)     kidney  . Liver lesion 2011  . Diverticulosis   . Adenomatous colon polyp   . IBS (irritable bowel syndrome)   . History of diverticulitis of colon   . Hemorrhoids   . Acquired cyst of kidney 01/08/2012    pt denies history of   . Depressive disorder, not elsewhere classified 01/08/2012    not currently     Past Surgical History  Procedure Laterality Date  . Partial hysterectomy      right ovary remains  . Dilation and curettage of uterus    . Cholecystectomy    . Breast reduction surgery  1990  . Tonsillectomy  1972  . Left oophorectomy  1976  . Knee arthrotomy      right  . Abdominal hysterectomy    . Flexible sigmoidoscopy  01/08/2012    Procedure: FLEXIBLE SIGMOIDOSCOPY;  Surgeon: Hart Carwin, MD;  Location: [facility];  Service: Endoscopy;  Laterality: N/A;    Family History  Problem Relation Age of Onset  . Breast cancer Cousin   . Cirrhosis Father   . Hypertension Mother   .  Hypertension Sister   . Diabetes Mother   . Diabetes Cousin   . Diabetes      aunt  . Stroke      aunt  . Heart disease Father   . Heart disease Brother     x 2  . Colon cancer Neg Hx     History  Substance Use Topics  . Smoking status: Never Smoker   . Smokeless tobacco: Never Used  . Alcohol Use: No    OB History   Grav Para Term Preterm Abortions TAB SAB Ect Mult Living                  Review of Systems  Constitutional: Negative for fever, chills and fatigue.  HENT: Negative for facial swelling, drooling, neck pain and dental problem.   Eyes: Negative for pain, discharge and itching.  Respiratory: Negative for cough, choking,  wheezing and stridor.   Cardiovascular: Positive for chest pain.  Gastrointestinal: Positive for nausea. Negative for vomiting, abdominal pain and diarrhea.  Endocrine: Negative for cold intolerance and heat intolerance.  Genitourinary: Negative for vaginal discharge, difficulty urinating and vaginal pain.  Skin: Negative for pallor and rash.  Neurological: Negative for dizziness, light-headedness and headaches.  Psychiatric/Behavioral: Negative for behavioral problems and agitation.    Allergies  Simvastatin  Home Medications   Current Outpatient Rx  Name  Route  Sig  Dispense  Refill  . aspirin 81 MG tablet   Oral   Take 81 mg by mouth daily.           Marland Kitchen atenolol (TENORMIN) 25 MG tablet      TAKE 1 TABLET BY MOUTH EVERY DAY   30 tablet   5   . azithromycin (ZITHROMAX Z-PAK) 250 MG tablet      2 tabs by mouth on day day 1, followed by 1 tab by mouth daily days 2-5   6 each   0   . fluticasone (FLONASE) 50 MCG/ACT nasal spray   Nasal   Place 2 sprays into the nose daily.   16 g   6   . hydrochlorothiazide (HYDRODIURIL) 25 MG tablet      TAKE 1 TABLET BY MOUTH EVERY DAY   30 tablet   3     Needs to schedule follow up appointment for furthe ...   . nortriptyline (PAMELOR) 10 MG capsule   Oral   Take 20 mg by mouth at bedtime.            BP 150/86  Pulse 100  Temp(Src) 98.7 F (37.1 C) (Oral)  Resp 18  SpO2 97%  Physical Exam  Constitutional: She is oriented to person, place, and time. She appears well-developed. No distress.  HENT:  Head: Normocephalic and atraumatic.  Eyes: Pupils are equal, round, and reactive to light. Right eye exhibits no discharge. Left eye exhibits no discharge.  Neck: Neck supple. No tracheal deviation present.  Cardiovascular: Normal rate.  Exam reveals no gallop and no friction rub.   Pulmonary/Chest: No stridor. No respiratory distress. She has no wheezes.  Abdominal: Soft. She exhibits no distension. There is no  tenderness. There is no rebound.  Musculoskeletal: She exhibits no edema and no tenderness.  Neurological: She is alert and oriented to person, place, and time.  Skin: Skin is warm. She is not diaphoretic.    ED Course  Procedures (including critical care time)  Labs Reviewed  CBC WITH DIFFERENTIAL  BASIC METABOLIC PANEL   No results  found.    MDM   ECG is 78, NSR, no T wave inversions, no ST changes concerning for acute ischemia.   Pt's story is concerning for angina and ACS etiology. Currently is almost completely pain free. Will give ASA, ECG, trops. Pt will have to be admitted for further cardiac work up.   Trop is negative -- pt is admitted for further evaluation and care.    1. Chest pain   2. Chest pain, unspecified   3. Obesity, unspecified   4. Shortness of breath   5. Unspecified essential hypertension           Bernadene Person, MD 03/08/13 2235

## 2013-03-08 NOTE — ED Notes (Signed)
Pt presents to department for evaluation of midsternal non radiating chest pain. Ongoing intermittently since last Monday. 9/10 pain at the time. Also states SOB and generalized weakness. Pt is alert and oriented x4. Skin warm and dry.

## 2013-03-09 ENCOUNTER — Observation Stay (HOSPITAL_COMMUNITY): Payer: BC Managed Care – PPO

## 2013-03-09 DIAGNOSIS — R079 Chest pain, unspecified: Secondary | ICD-10-CM

## 2013-03-09 LAB — BASIC METABOLIC PANEL
BUN: 13 mg/dL (ref 6–23)
Chloride: 104 mEq/L (ref 96–112)
GFR calc Af Amer: 83 mL/min — ABNORMAL LOW (ref >90)
Potassium: 3.2 mEq/L — ABNORMAL LOW (ref 3.5–5.1)

## 2013-03-09 LAB — CBC
HCT: 36.8 % (ref 36.0–46.0)
Hemoglobin: 12.6 g/dL (ref 12.0–15.0)
WBC: 7.9 10*3/uL (ref 4.0–10.5)

## 2013-03-09 LAB — LIPID PANEL
HDL: 32 mg/dL — ABNORMAL LOW (ref >39)
Triglycerides: 401 mg/dL — ABNORMAL HIGH (ref <150)

## 2013-03-09 LAB — CK TOTAL AND CKMB (NOT AT ARMC)
Relative Index: INVALID (ref 0.0–2.5)
Relative Index: INVALID (ref 0.0–2.5)

## 2013-03-09 LAB — TROPONIN I: Troponin I: <0.3 ng/mL (ref <0.30)

## 2013-03-09 MED ORDER — TECHNETIUM TC 99M SESTAMIBI - CARDIOLITE
10.0000 | Freq: Once | INTRAVENOUS | Status: AC | PRN
  Administered 2013-03-09: 10 via INTRAVENOUS

## 2013-03-09 MED ORDER — NITROGLYCERIN 0.4 MG SL SUBL: 0.4000 mg | SUBLINGUAL_TABLET | SUBLINGUAL | Status: DC | PRN

## 2013-03-09 MED ORDER — EZETIMIBE 10 MG PO TABS
10.0000 mg | ORAL_TABLET | Freq: Every day | ORAL | Status: DC
  Administered 2013-03-09: 10 mg via ORAL
  Filled 2013-03-09 (×2): qty 1

## 2013-03-09 MED ORDER — ASPIRIN 325 MG PO TBEC: 325.0000 mg | DELAYED_RELEASE_TABLET | Freq: Every day | ORAL | Status: DC

## 2013-03-09 MED ORDER — POTASSIUM CHLORIDE CRYS ER 20 MEQ PO TBCR
40.0000 meq | EXTENDED_RELEASE_TABLET | Freq: Once | ORAL | Status: AC
  Administered 2013-03-09: 40 meq via ORAL
  Filled 2013-03-09: qty 2

## 2013-03-09 MED ORDER — REGADENOSON 0.4 MG/5ML IV SOLN
0.4000 mg | Freq: Once | INTRAVENOUS | Status: AC
  Administered 2013-03-09: 0.4 mg via INTRAVENOUS

## 2013-03-09 MED ORDER — EZETIMIBE 10 MG PO TABS: 10.0000 mg | ORAL_TABLET | Freq: Every day | ORAL | Status: DC

## 2013-03-09 MED ORDER — ASPIRIN EC 325 MG PO TBEC
325.0000 mg | DELAYED_RELEASE_TABLET | Freq: Every day | ORAL | Status: DC
  Administered 2013-03-09: 325 mg via ORAL
  Filled 2013-03-09: qty 1

## 2013-03-09 MED ORDER — TECHNETIUM TC 99M SESTAMIBI - CARDIOLITE
30.0000 | Freq: Once | INTRAVENOUS | Status: AC | PRN
  Administered 2013-03-09: 30 via INTRAVENOUS

## 2013-03-09 NOTE — ED Provider Notes (Signed)
I saw and evaluated the patient, reviewed the resident's note and I agree with the findings and plan. On my exam she was in no distress.  She required admission for further E/M of her CP.  I saw the ECG, relevant labs and studies - I agree with the interpretation. (non ischemic ECG)   Gerhard Munch, MD 03/09/13 [phone]

## 2013-03-09 NOTE — Discharge Summary (Signed)
Physician Discharge Summary  Patient ID: Nicole Daniels MRN: [id number] DOB/AGE: Jul 22, 1951 62 y.o.  Admit date: 03/08/2013 Discharge date: 03/09/2013  Primary Care Physician:  Ruthe Mannan, MD  Discharge Diagnoses:    . CHEST PAIN . HYPERLIPIDEMIA . HYPERTENSION, UNSPECIFIED . OBESITY  Consults: [facility]   Allergies:   Allergies  Allergen Reactions  . Simvastatin Other (See Comments)    Leg pain     Discharge Medications:   Medication List    STOP taking these medications       aspirin 81 MG tablet      TAKE these medications       aspirin 325 MG EC tablet  Take 1 tablet (325 mg total) by mouth daily.     atenolol 25 MG tablet  Commonly known as:  TENORMIN  Take 25 mg by mouth daily.     ezetimibe 10 MG tablet  Commonly known as:  ZETIA  Take 1 tablet (10 mg total) by mouth daily.     hydrochlorothiazide 25 MG tablet  Commonly known as:  HYDRODIURIL  Take 25 mg by mouth at bedtime.     nitroGLYCERIN 0.4 MG SL tablet  Commonly known as:  NITROSTAT  Place 1 tablet (0.4 mg total) under the tongue every 5 (five) minutes as needed for chest pain.     nortriptyline 25 MG capsule  Commonly known as:  PAMELOR  Take 75 mg by mouth at bedtime.         Brief H and P: For complete details please refer to admission H and P, but in brief Patient is a 62 year old female with past medical history of hypertension, hyperlipidemia, obesity, recent sinusitis presented to ED with chest pain. Patient reported that her chest pain episode started on Monday, 5 days ago. She had intermittent episodes and resolved spontaneously not related to any exertion at that time. Over the next 4 days she had only isolated 1 or 2 episodes, she did not come to the ER as they were spontaneously resolved. However today patient was shopping when she suddenly had started having chest pain around 10 AM, associated with slight shortness of breath, nausea and diaphoresis. Patient's  chest pain did not improve hence she presented to the ED. She also stated that bending in a certain way also makes it has been worse. In ED, she received only one aspirin, her chest pain has somewhat improved. She also has noticed exertional dyspnea lately.   Hospital Course:     CHEST PAIN: Patient was admitted for chest pain workup. She has restarted as of hypertension, hyperlipidemia, obesity. 3 sets of cardiac enzymes were negative for acute ACS. Cardiology was consulted and patient underwent nuclear medicine stress test which showed no evidence of inducible left ventricular  myocardial ischemia or scarring. EF is 85%, normal left ventricular wall motion and thickening. Patient was cleared by the neurology service for discharge.    HYPERLIPIDEMIA: Lipid panel showed cholesterol 228, triglycerides 401, patient was started on Zetia. She was explained all the side effects and details, recommended to recheck LFTs and lipid panel in 8 weeks     OBESITY: Patient was strongly counseled diet and weight control    HYPERTENSION: Stable   Day of Discharge BP 145/49  Pulse 94  Temp(Src) 98.1 F (36.7 C) (Oral)  Resp 18  Ht 5\' 5"  (1.651 m)  Wt 89.585 kg (197 lb 8 oz)  BMI 32.87 kg/m2  SpO2 95%  Physical Exam: General: Alert and awake oriented  x3 not in any acute distress. CVS: S1-S2 clear no murmur rubs or gallops Chest: clear to auscultation bilaterally, no wheezing rales or rhonchi Abdomen:  obese, soft nontender, nondistended, normal bowel sounds Extremities: no cyanosis, clubbing or edema noted bilaterally Neuro: Cranial nerves II-XII intact, no focal neurological deficits   The results of significant diagnostics from this hospitalization (including imaging, microbiology, ancillary and laboratory) are listed below for reference.    LAB RESULTS: Basic Metabolic Panel:  Recent Labs Lab 03/08/13 1654 03/08/13 2044 03/08/13 2216 03/09/13 0213  NA 141  --   --  141  K 3.1*  --    --  3.2*  CL 102  --   --  104  CO2 27  --   --  27  GLUCOSE 100*  --   --  108*  BUN 14  --   --  13  CREATININE 0.89 0.98  --  0.85  CALCIUM 9.6  --   --  8.6  MG  --   --  2.0  --    Liver Function Tests: No results found for this basename: AST, ALT, ALKPHOS, BILITOT, PROT, ALBUMIN,  in the last 168 hours No results found for this basename: LIPASE, AMYLASE,  in the last 168 hours No results found for this basename: AMMONIA,  in the last 168 hours CBC:  Recent Labs Lab 03/08/13 1654 03/08/13 2044 03/09/13 0213  WBC 8.7 10.4 7.9  NEUTROABS 4.9  --   --   HGB 14.2 13.5 12.6  HCT 40.4 39.1 36.8  MCV 91.0 91.8 90.9  PLT 373 364 314   Cardiac Enzymes:  Recent Labs Lab 03/09/13 0221 03/09/13 0753  CKTOTAL 77 74  CKMB 1.5 1.5  TROPONINI <0.30 <0.30   BNP: No components found with this basename: POCBNP,  CBG: No results found for this basename: GLUCAP,  in the last 168 hours  Significant Diagnostic Studies:  Dg Chest 2 View  03/08/2013  *RADIOLOGY REPORT*  Clinical Data: Midsternal chest pain  CHEST - 2 VIEW  Comparison: 07/27/2008  Findings: Nipple shadow incidentally projects over the lung bases. The heart size is normal and the lungs are clear.  No pleural effusion. Cholecystectomy clips noted.  No acute osseous finding.  IMPRESSION: No acute cardiopulmonary process.   Original Report Authenticated By: Christiana Pellant, M.D.    Nuclear medicine stress test 03/09/2013 IMPRESSION:  No evidence of inducible left ventricular myocardial ischemia or  scarring.  Left ventricular ejection fraction calculated at 85% on this study.  Normal left ventricular wall motion and thickening.   Disposition and Follow-up: Discharge Orders   Future Orders Complete By Expires     Diet - low sodium heart healthy  As directed     Increase activity slowly  As directed         DISPOSITION: Home DIET: Heart healthy diet ACTIVITY: As tolerated   DISCHARGE FOLLOW-UP Follow-up  Information   Follow up with Ruthe Mannan, MD. Schedule an appointment as soon as possible for a visit in 2 weeks. (for hospital follow-up)    Contact information:   [address] [place] [address] [place] [address] [phone]       Follow up with Marca Ancona, MD. Schedule an appointment as soon as possible for a visit in 2 weeks.   Contact information:   [address] [place] [address] [phone]       Time spent on  Discharge: 32 minutes Signed:   Yoshito Gaza M.D. [facility] 03/09/2013, 2:13 PM Pager: [phone]

## 2013-03-09 NOTE — Progress Notes (Signed)
Nicole Daniels  62 y.o.  female  Subjective: No further chest discomfort. Patient is convinced that the etiology was likely musculoskeletal.  Allergy: Simvastatin  Objective: Vital signs in last 24 hours: Temp:  [97.8 F (36.6 C)-98.7 F (37.1 C)] 97.8 F (36.6 C) (05/11 0557) Pulse Rate:  [73-108] 100 (05/11 1008) Resp:  [16-24] 18 (05/11 0557) BP: (121-182)/(45-86) 160/51 mmHg (05/11 1008) SpO2:  [93 %-98 %] 95 % (05/11 0557) Weight:  [89.585 kg (197 lb 8 oz)] 89.585 kg (197 lb 8 oz) (05/10 2050)  89.585 kg (197 lb 8 oz) Body mass index is 32.87 kg/(m^2).  Weight change:  Last BM Date: 03/08/13  Intake/Output from previous day: 05/10 0701 - 05/11 0700 In: 731.3 [P.O.:240; I.V.:491.3] Out: -  Total I/O since admission:    General- Well developed; no acute distress; mildly to moderately overweight Neck- No JVD, no carotid bruits Lungs- clear lung fields; normal I:E ratio Cardiovascular- normal PMI; normal S1 and S2; modest basilar systolic ejection murmur Abdomen- normal bowel sounds; soft and non-tender without masses or organomegaly Skin- Warm, no significant lesions Extremities- Nl distal pulses; no edema  Lab Results: Cardiac Markers:   Recent Labs  03/09/13 0221 03/09/13 0753  TROPONINI <0.30 <0.30   CBC:   Recent Labs  03/08/13 2044 03/09/13 0213  WBC 10.4 7.9  HGB 13.5 12.6  HCT 39.1 36.8  PLT 364 314   BMET:  Recent Labs  03/08/13 1654 03/08/13 2044 03/09/13 0213  NA 141  --  141  K 3.1*  --  3.2*  CL 102  --  104  CO2 27  --  27  GLUCOSE 100*  --  108*  BUN 14  --  13  CREATININE 0.89 0.98 0.85  CALCIUM 9.6  --  8.6   Lipids:  Lipid Panel     Component Value Date/Time   CHOL 228* 03/09/2013 0213   TRIG 401* 03/09/2013 0213   HDL 32* 03/09/2013 0213   CHOLHDL 7.1 03/09/2013 0213   VLDL UNABLE TO CALCULATE IF TRIGLYCERIDE OVER 400 mg/dL 7/84/6962 9528   LDLCALC UNABLE TO CALCULATE IF TRIGLYCERIDE OVER 400 mg/dL 02/10/2439 1027    Imaging Studies/Results: Dg Chest 2 View  03/08/2013  *RADIOLOGY REPORT*  Clinical Data: Midsternal chest pain  CHEST - 2 VIEW  Comparison: 07/27/2008  Findings: Nipple shadow incidentally projects over the lung bases. The heart size is normal and the lungs are clear.  No pleural effusion. Cholecystectomy clips noted.  No acute osseous finding.  IMPRESSION: No acute cardiopulmonary process.   Original Report Authenticated By: Christiana Pellant, M.D.    Stress Nuclear: Normal left ventricular size and function; normal myocardial perfusion. No evidence for myocardial ischemia or infarction.  Imaging: Imaging results have been reviewed  Medications:  I have reviewed the patient's current medications. Scheduled: . aspirin EC  325 mg Oral Daily  . atenolol  25 mg Oral Daily  . enoxaparin (LOVENOX) injection  40 mg Subcutaneous Q24H  . ezetimibe  10 mg Oral Daily  . hydrochlorothiazide  25 mg Oral QHS  .  morphine injection  2 mg Intravenous Once  . nitroGLYCERIN  0.2 mg Transdermal Daily  . nortriptyline  75 mg Oral QHS  . sodium chloride  3 mL Intravenous Q12H   Assessment/Plan: Chest pain:  Stress nuclear study is negative; patient can be discharged  From cardiology standpoint.  Hypertension:  Suboptimal control in hospital. Patient is scheduled to return to see Dr. Shirlee Latch for further assessment  and treatment of this problem.  Hyperlipidemia: Lipid profile is suboptimal. This problem will also be further managed in outpatient followup.   LOS: 1 day   [place] Bing 03/09/2013, 10:51 AM

## 2013-03-09 NOTE — Progress Notes (Signed)
Ur ins review. 

## 2013-03-09 NOTE — Progress Notes (Signed)
Physical Therapy Discharge Patient Details Name: Nicole Daniels MRN: [id number] DOB: [birth year] Today's Date: 03/09/2013 Time:  -     Patient discharged from PT services secondary to screened for mobility changes or deficits and none found. Pt denies changes in current independence.  No PT needs, signing off service.  Plan discussed with patient and/or caregiver: Patient/Caregiver agrees with plan       Dennis Bast 03/09/2013, 12:48 PM

## 2013-03-24 ENCOUNTER — Other Ambulatory Visit: Payer: Self-pay | Admitting: Cardiology

## 2013-03-25 ENCOUNTER — Other Ambulatory Visit: Payer: Self-pay

## 2013-03-25 MED ORDER — HYDROCHLOROTHIAZIDE 25 MG PO TABS: 25.0000 mg | ORAL_TABLET | Freq: Every day | ORAL | Status: DC

## 2013-03-25 NOTE — Telephone Encounter (Signed)
Pt request Dr Dayton Martes to refill HCTZ to [facility]; pt said has been done in past by Dr Shirlee Latch but wants Dr Dayton Martes to start to fill for pt.Please advise.

## 2013-04-11 ENCOUNTER — Ambulatory Visit (INDEPENDENT_AMBULATORY_CARE_PROVIDER_SITE_OTHER): Payer: BC Managed Care – PPO | Admitting: Family Medicine

## 2013-04-11 ENCOUNTER — Encounter: Payer: Self-pay | Admitting: Family Medicine

## 2013-04-11 VITALS — BP 114/80 | HR 85 | Temp 98.6°F | Wt 191.5 lb

## 2013-04-11 DIAGNOSIS — J020 Streptococcal pharyngitis: Secondary | ICD-10-CM | POA: Insufficient documentation

## 2013-04-11 MED ORDER — AMOXICILLIN 875 MG PO TABS: 875.0000 mg | ORAL_TABLET | Freq: Two times a day (BID) | ORAL | Status: AC

## 2013-04-11 NOTE — Progress Notes (Signed)
duration of symptoms: a few days rhinorrhea:minimal congestion:yes ear pain: no but some jaw pain sore throat:yes cough:yes Myalgias: mild, a few days ago, resolved now.   other concerns: noted sore glands in neck No sick contacts. Hasn't tried any OTC meds other than occ excedrin/ibuprofen No fevers known.  She has sweats at baseline.    ROS: See HPI.  Otherwise negative.    Meds, vitals, and allergies reviewed.   GEN: nad, alert and oriented HEENT: mucous membranes moist, TM w/o erythema, nasal epithelium injected, scab noted w/o bleeding in L nostril, OP with cobblestoning NECK: supple with tender LA CV: rrr. PULM: ctab, no inc wob ABD: soft, +bs EXT: no edema

## 2013-04-11 NOTE — Assessment & Plan Note (Signed)
RST pos, nontoxic, amoxil and f/u prn.  D/w pt.

## 2013-04-11 NOTE — Patient Instructions (Addendum)
Drink plenty of fluids, take tylenol as needed, and gargle with warm salt water for your throat.  Start the antibiotics today.  This should gradually improve.  Take care.  Let us know if you have other concerns.    

## 2013-05-01 ENCOUNTER — Telehealth: Payer: Self-pay

## 2013-05-01 NOTE — Telephone Encounter (Signed)
Advised patient as instructed.  She agrees to going to Saturday clinic.  Call transferred to [facility] to schedule.

## 2013-05-01 NOTE — Telephone Encounter (Signed)
She was strep positive.  The sensitivity for strep (to amoxil) is ~100%.  She was likely treated effectively.  It is likely that she has another illness and it isn't appropriate to treat blindly.  Needs eval.  I'll defer to PCP, but I didn't sent the rx.

## 2013-05-01 NOTE — Telephone Encounter (Signed)
Agree with Dr. Para March.  Can she be seen in weekend clinic?

## 2013-05-01 NOTE — Telephone Encounter (Signed)
Pt was seen 04/11/13 by Dr Para March with strep throat; pt finished med and felt OK; no S/T. S/T began again 3 days ago but today it hurts to swallow, ? Fever, head congested,prod cough with yellow phelgm and slight h/a. Pt request Z pak  to [facility]; amoxicillin does not usually clear infections for pt.Please advise. Pt does not want to make appt.

## 2013-05-03 ENCOUNTER — Ambulatory Visit (INDEPENDENT_AMBULATORY_CARE_PROVIDER_SITE_OTHER): Payer: BC Managed Care – PPO | Admitting: Family Medicine

## 2013-05-03 ENCOUNTER — Encounter: Payer: Self-pay | Admitting: Family Medicine

## 2013-05-03 VITALS — BP 120/78 | HR 84 | Temp 98.1°F | Wt 192.0 lb

## 2013-05-03 DIAGNOSIS — J029 Acute pharyngitis, unspecified: Secondary | ICD-10-CM

## 2013-05-03 DIAGNOSIS — J019 Acute sinusitis, unspecified: Secondary | ICD-10-CM

## 2013-05-03 MED ORDER — CEFUROXIME AXETIL 500 MG PO TABS: 500.0000 mg | ORAL_TABLET | Freq: Two times a day (BID) | ORAL | Status: AC

## 2013-05-03 MED ORDER — FLUTICASONE PROPIONATE 50 MCG/ACT NA SUSP: NASAL | Status: DC

## 2013-05-03 NOTE — Patient Instructions (Signed)

## 2013-05-03 NOTE — Progress Notes (Signed)
  Subjective:     Nicole Daniels is a 62 y.o. female who presents for evaluation of sinus pain. Symptoms include: congestion, facial pain, nasal congestion, sinus pressure and sore throat. Onset of symptoms was 1 week ago. Symptoms have been gradually worsening since that time. Past history is significant for no history of pneumonia or bronchitis. Patient is a non-smoker. Pt was treated for strep 2 weeks ago with amoxicillin.   The following portions of the patient's history were reviewed and updated as appropriate: allergies, current medications, past family history, past medical history, past social history, past surgical history and problem list.  Review of Systems Pertinent items are noted in HPI.   Objective:    BP 120/78  Pulse 84  Temp(Src) 98.1 F (36.7 C) (Oral)  Wt 192 lb (87.091 kg)  BMI 31.95 kg/m2 General appearance: alert, cooperative, appears stated age and no distress Ears: normal TM's and external ear canals both ears Nose: green discharge, moderate congestion, turbinates red, swollen, sinus tenderness bilateral Throat: lips, mucosa, and tongue normal; teeth and gums normal Neck: moderate anterior cervical adenopathy, supple, symmetrical, trachea midline and thyroid not enlarged, symmetric, no tenderness/mass/nodules Lungs: clear to auscultation bilaterally Heart: S1, S2 normal    Assessment:    Acute bacterial sinusitis.    Plan:    Nasal steroids per medication orders. Antihistamines per medication orders. Ceftin per medication orders. f/u pcp prn

## 2013-05-19 ENCOUNTER — Telehealth: Payer: Self-pay | Admitting: Family Medicine

## 2013-05-19 NOTE — Telephone Encounter (Signed)
Pt left v/m stating that she has been on abx during the last month for strep throat and sinusitis and now thinks that she has a yeast infection.  Requesting diflucan to be called in to [facility].

## 2013-05-20 MED ORDER — FLUCONAZOLE 150 MG PO TABS: 150.0000 mg | ORAL_TABLET | Freq: Once | ORAL | Status: DC

## 2013-05-20 NOTE — Telephone Encounter (Signed)
Patient notified as instructed by telephone. 

## 2013-05-20 NOTE — Telephone Encounter (Signed)
Rx sent.  Please let us know if symptoms do not improve.

## 2013-06-03 IMAGING — CT CT PARANASAL SINUSES LIMITED
1 of 2 series · 16 of 19 positions shown, 20 images · non-contrast
Comparison: 08/24/2011

CLINICAL DATA: Cough, facial pressure

CT LIMITED SINUSES WITHOUT CONTRAST
TECHNIQUE: Multidetector CT images of the paranasal sinuses were
obtained in a single plane without contrast.

[Series 4: ltd sinus 3.0 h30s · axial · 0.38mm/px · z∈[-139,-44]mm · 16 of 18 slices shown, 20 images]
[im 2/18  brain]
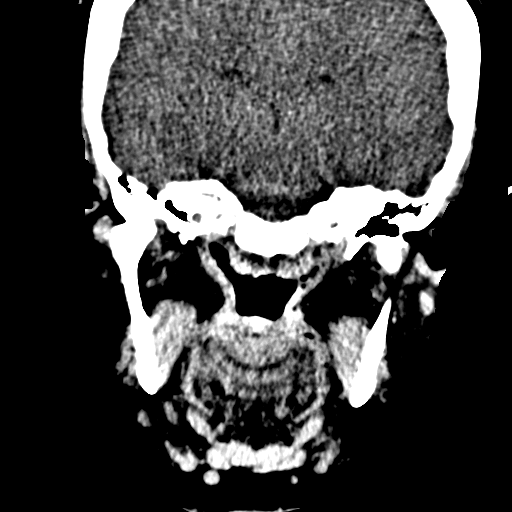
[im 2/18  bone]
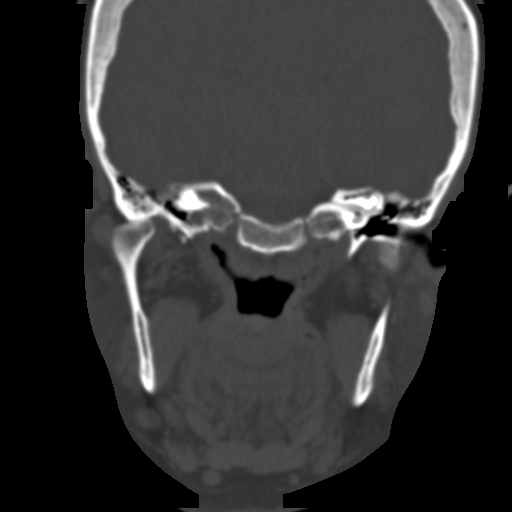
[im 3/18  bone]
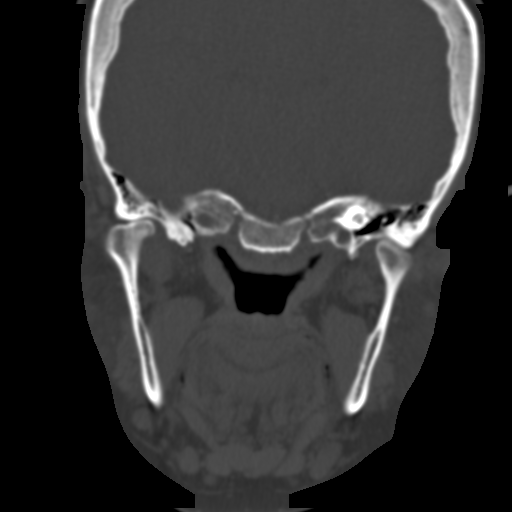
[im 4/18  bone]
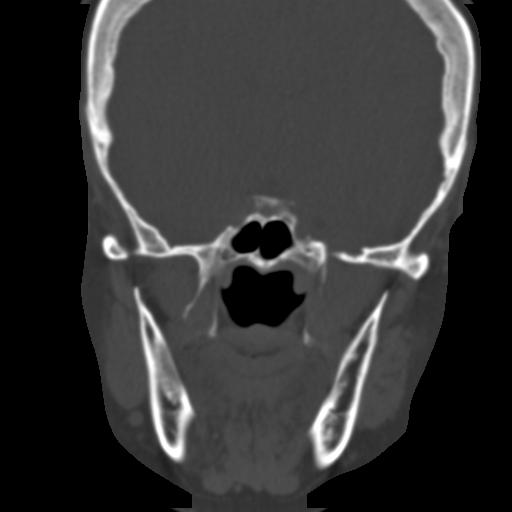
[im 5/18  bone]
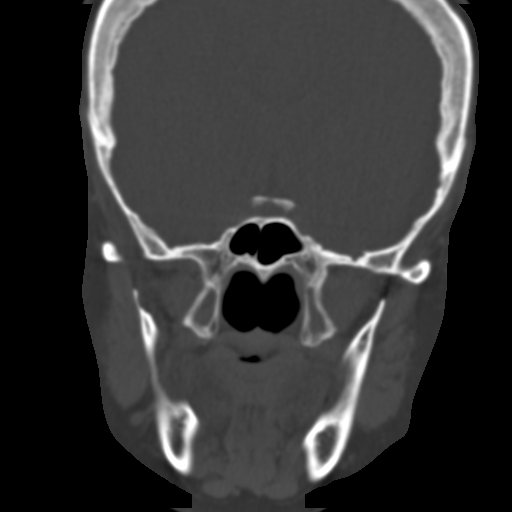
[im 6/18  brain]
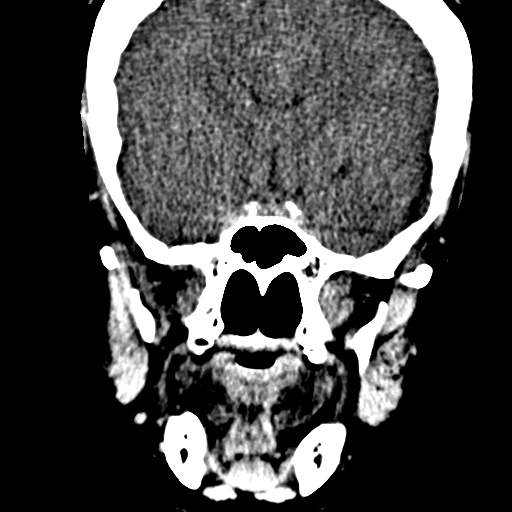
[im 6/18  bone]
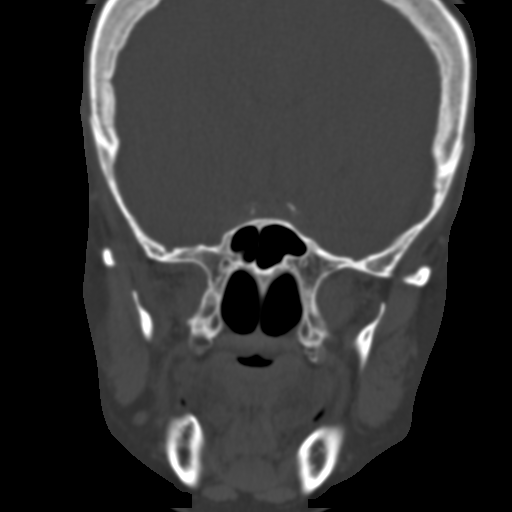
[im 7/18  bone]
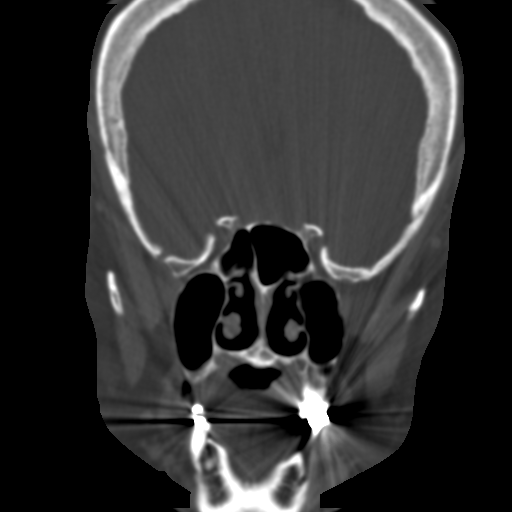
[im 8/18  bone]
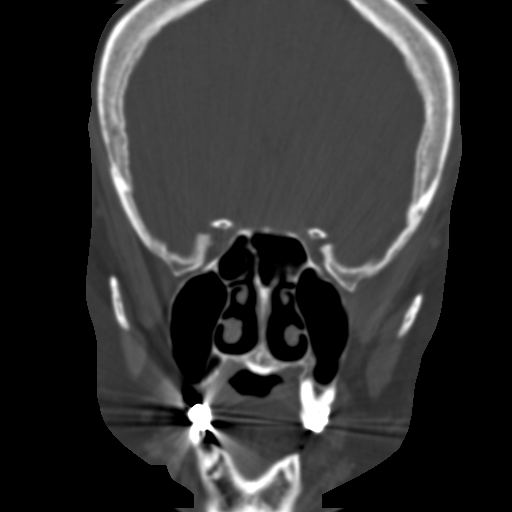
[im 9/18  bone]
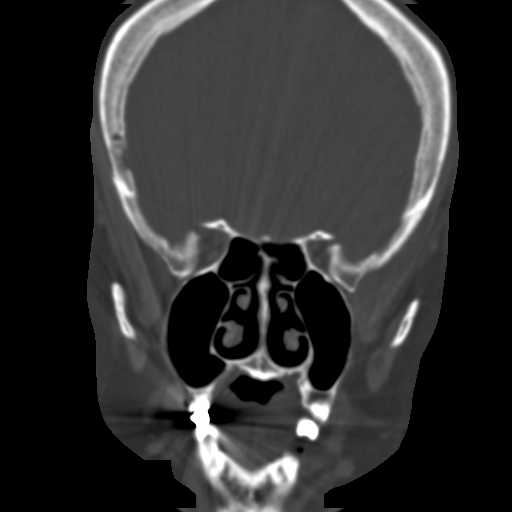
[im 10/18  brain]
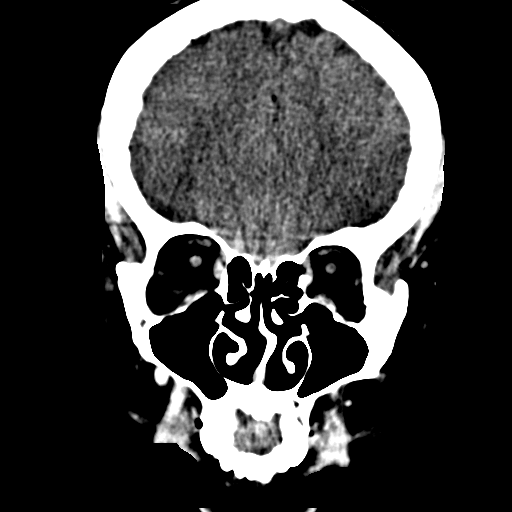
[im 10/18  bone]
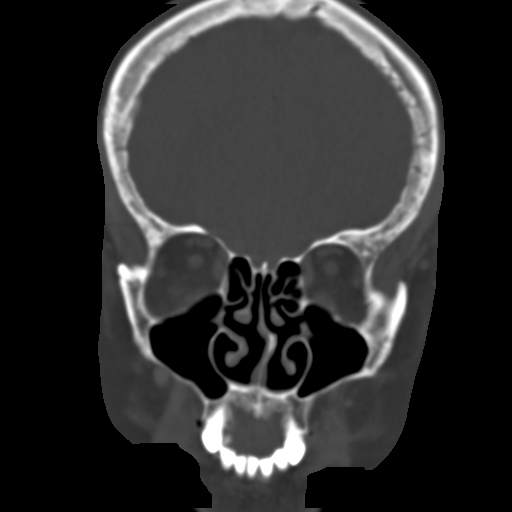
[im 11/18  bone]
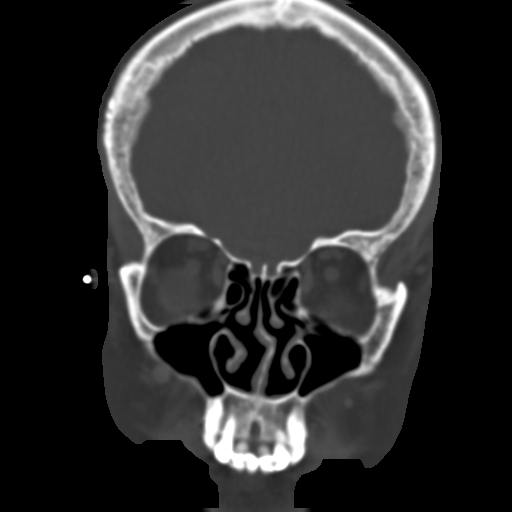
[im 12/18  bone]
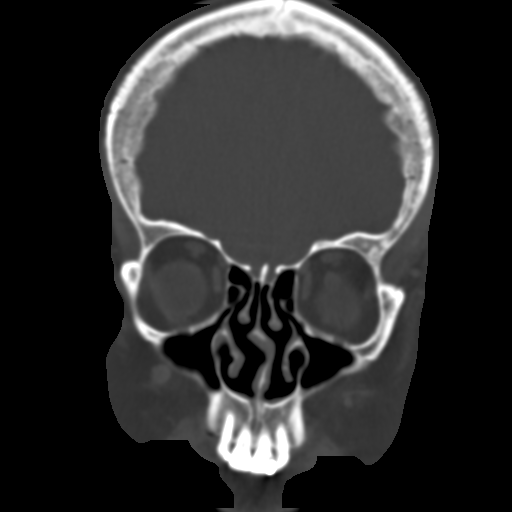
[im 13/18  bone]
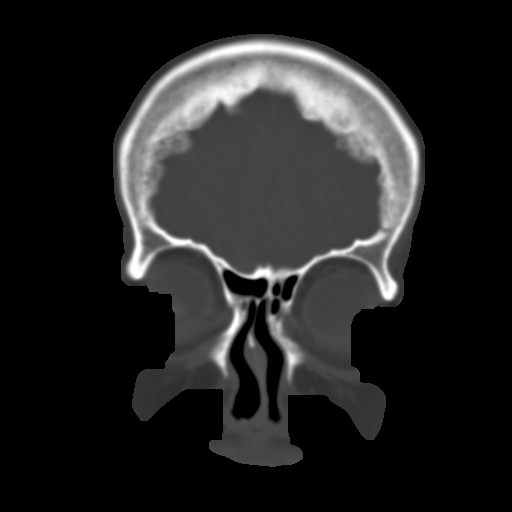
[im 14/18  brain]
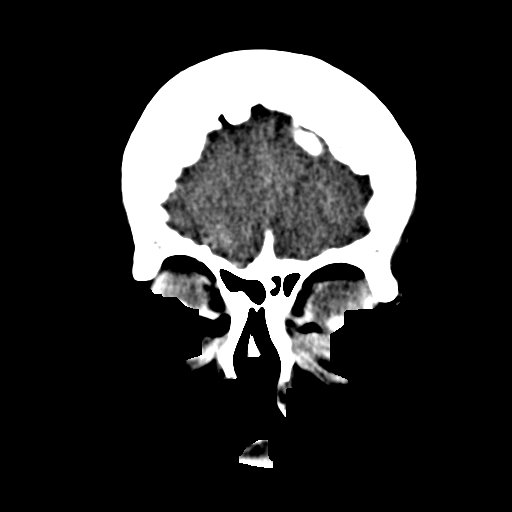
[im 14/18  bone]
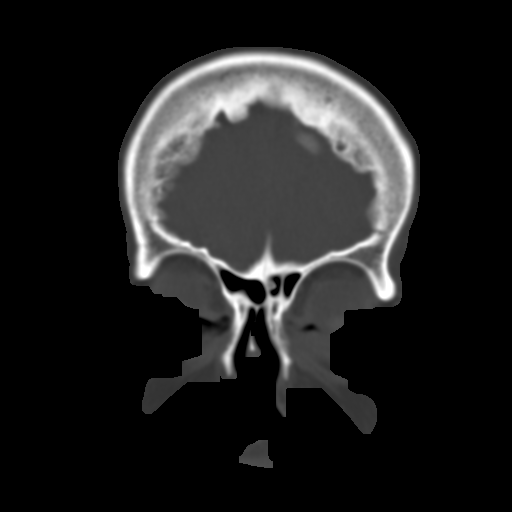
[im 15/18  bone]
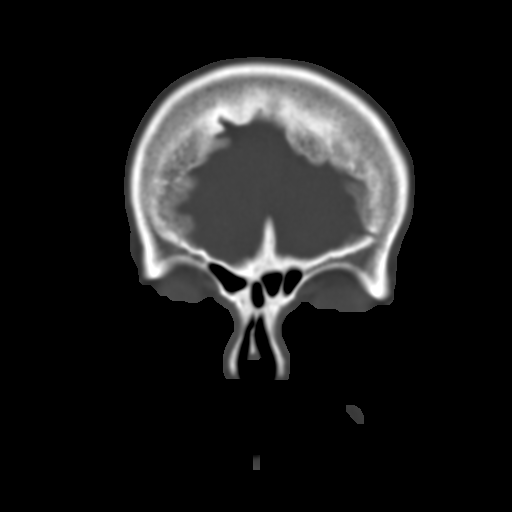
[im 16/18  bone]
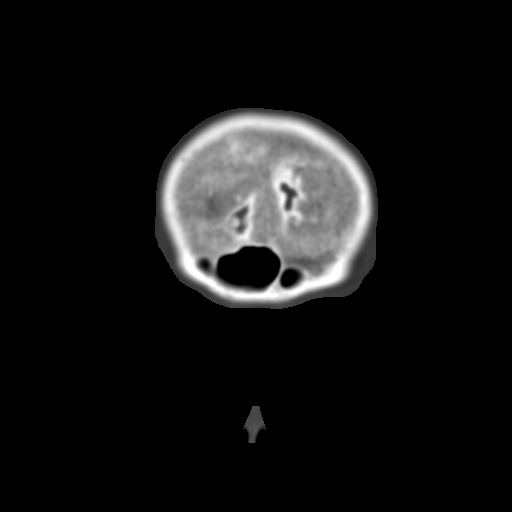
[im 17/18  bone]
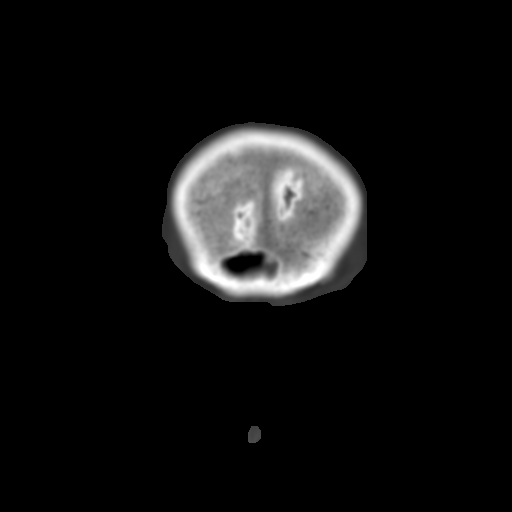

[16 of 19 positions shown; findings below may reference images not displayed]

FINDINGS: The paranasal sinuses are normally developed and well
aerated.  Nasal septum is midline.  Ostiomeatal units appear patent
bilaterally.  Regional soft tissues unremarkable.  Multiple dental
restorations.
IMPRESSION: 1.  Negative

## 2013-08-21 ENCOUNTER — Other Ambulatory Visit: Payer: Self-pay | Admitting: Family Medicine

## 2013-09-20 ENCOUNTER — Other Ambulatory Visit: Payer: Self-pay | Admitting: Neurology

## 2013-10-28 ENCOUNTER — Other Ambulatory Visit: Payer: Self-pay | Admitting: Neurology

## 2013-11-05 ENCOUNTER — Encounter: Payer: Self-pay | Admitting: Family Medicine

## 2013-11-05 ENCOUNTER — Ambulatory Visit (INDEPENDENT_AMBULATORY_CARE_PROVIDER_SITE_OTHER): Payer: BC Managed Care – PPO | Admitting: Family Medicine

## 2013-11-05 VITALS — BP 124/76 | HR 76 | Temp 97.9°F | Ht 65.0 in | Wt 193.5 lb

## 2013-11-05 DIAGNOSIS — R300 Dysuria: Secondary | ICD-10-CM

## 2013-11-05 DIAGNOSIS — N390 Urinary tract infection, site not specified: Secondary | ICD-10-CM

## 2013-11-05 DIAGNOSIS — J019 Acute sinusitis, unspecified: Secondary | ICD-10-CM

## 2013-11-05 LAB — POCT URINALYSIS DIPSTICK
Bilirubin, UA: NEGATIVE
Glucose, UA: NEGATIVE
KETONES UA: NEGATIVE
Nitrite, UA: NEGATIVE
PH UA: 6.5
Spec Grav, UA: 1.02
UROBILINOGEN UA: 0.2

## 2013-11-05 MED ORDER — SULFAMETHOXAZOLE-TMP DS 800-160 MG PO TABS: 1.0000 | ORAL_TABLET | Freq: Two times a day (BID) | ORAL | Status: DC

## 2013-11-05 NOTE — Patient Instructions (Signed)
You do have a urinary tract infection. Please take Bactrim as directed- 1 tablet twice daily x 7 days. This should treat your sinus infection as well.

## 2013-11-05 NOTE — Progress Notes (Signed)
SUBJECTIVE: Nicole Daniels is a 63 y.o. female who complains of urinary frequency, urgency and dysuria x 5 days, without flank pain, fever, chills, or abnormal vaginal discharge or bleeding.   She also has had sinus pressure for over a week.  Patient Active Problem List   Diagnosis Date Noted  . UTI (urinary tract infection) 11/05/2013  . HYPERTENSION, UNSPECIFIED 12/06/2010  . MIGRAINE, CHRONIC 11/24/2010  . UNSPECIFIED VITAMIN D DEFICIENCY 04/08/2010  . DEPRESSION, MILD 03/09/2010  . RENAL CYST 12/22/2009  . SINUSITIS - ACUTE-NOS 09/20/2009  . SINUSITIS, CHRONIC 08/13/2008  . OBESITY 08/11/2008  . NEOPLASM, MALIGNANT, SKIN, FACE 12/01/2007  . PATENT FORAMEN OVALE 01/28/2005  . HYPERLIPIDEMIA 03/30/2001   Past Medical History  Diagnosis Date  . Chest pain, unspecified   . Dizziness and giddiness   . Dysfunction of eustachian tube   . Other malaise and fatigue   . Other and unspecified hyperlipidemia   . Unspecified essential hypertension   . Migraine, unspecified, without mention of intractable migraine without mention of status migrainosus   . Obesity, unspecified   . Neoplasm of skin of face     malignant  . Palpitations   . Ostium secundum type atrial septal defect   . Abdominal pain, right lower quadrant   . Shortness of breath   . Acute sinusitis, unspecified   . Unspecified sinusitis (chronic)   . Unspecified vitamin D deficiency   . OA (osteoarthritis)     knee  . Angiomyolipoma     kidney  . Liver lesion 2011  . Diverticulosis   . Adenomatous colon polyp   . IBS (irritable bowel syndrome)   . History of diverticulitis of colon   . Hemorrhoids   . Acquired cyst of kidney 01/08/2012    pt denies history of   . Depressive disorder, not elsewhere classified 01/08/2012    not currently    Past Surgical History  Procedure Laterality Date  . Partial hysterectomy      right ovary remains  . Dilation and curettage of uterus    . Cholecystectomy    .  Breast reduction surgery  1990  . Tonsillectomy  1972  . Left oophorectomy  1976  . Knee arthrotomy      right  . Abdominal hysterectomy    . Flexible sigmoidoscopy  01/08/2012    Procedure: FLEXIBLE SIGMOIDOSCOPY;  Surgeon: Lafayette Dragon, MD;  Location: [facility];  Service: Endoscopy;  Laterality: N/A;   History  Substance Use Topics  . Smoking status: Never Smoker   . Smokeless tobacco: Never Used  . Alcohol Use: No   Family History  Problem Relation Age of Onset  . Breast cancer Cousin   . Cirrhosis Father   . Hypertension Mother   . Hypertension Sister   . Diabetes Mother   . Diabetes Cousin   . Diabetes      aunt  . Stroke      aunt  . Heart disease Father   . Heart disease Brother     x 2  . Colon cancer Neg Hx    Allergies  Allergen Reactions  . Simvastatin Other (See Comments)    Leg pain   Current Outpatient Prescriptions on File Prior to Visit  Medication Sig Dispense Refill  . aspirin 325 MG EC tablet 325 mg. Take 4 aspirin daily.      Marland Kitchen atenolol (TENORMIN) 25 MG tablet TAKE 1 TABLET BY MOUTH EVERY DAY  30 tablet  2  .  fluticasone (FLONASE) 50 MCG/ACT nasal spray 2 sprays each nostril qd  16 g  2  . hydrochlorothiazide (HYDRODIURIL) 25 MG tablet Take 1 tablet (25 mg total) by mouth at bedtime.  30 tablet  6  . nortriptyline (PAMELOR) 25 MG capsule TAKE 3 CAPSULES BY MOUTH AT NIGHT  30 capsule  0   No current facility-administered medications on file prior to visit.   The PMH, PSH, Social History, Family History, Medications, and allergies have been reviewed in [facility], and have been updated if relevant.   OBJECTIVE:  BP 124/76  Pulse 76  Temp(Src) 97.9 F (36.6 C) (Oral)  Ht 5\' 5"  (1.651 m)  Wt 193 lb 8 oz (87.771 kg)  BMI 32.20 kg/m2  SpO2 97%  Appears well, in no apparent distress.  Vital signs are normal. The abdomen is soft without tenderness, guarding, mass, rebound or organomegaly. No CVA tenderness or inguinal adenopathy noted. Urine  dipstick shows positive for RBC's, positive for protein and positive for leukocytes.   ASSESSMENT: UTI uncomplicated without evidence of pyelonephritis  PLAN: Treatment per orders - bactrim ds, also push fluids, may use Pyridium OTC prn. Call or return to clinic prn if these symptoms worsen or fail to improve as anticipated.

## 2013-11-05 NOTE — Progress Notes (Signed)
Pre-visit discussion using our clinic review tool. No additional management support is needed unless otherwise documented below in the visit note.  

## 2013-11-07 LAB — URINE CULTURE
COLONY COUNT: NO GROWTH
Organism ID, Bacteria: NO GROWTH

## 2013-11-11 ENCOUNTER — Other Ambulatory Visit: Payer: Self-pay | Admitting: Family Medicine

## 2013-11-12 ENCOUNTER — Ambulatory Visit (INDEPENDENT_AMBULATORY_CARE_PROVIDER_SITE_OTHER): Payer: BC Managed Care – PPO | Admitting: Family Medicine

## 2013-11-12 ENCOUNTER — Other Ambulatory Visit: Payer: Self-pay

## 2013-11-12 ENCOUNTER — Encounter: Payer: Self-pay | Admitting: Family Medicine

## 2013-11-12 VITALS — BP 122/78 | HR 63 | Temp 98.2°F | Wt 191.8 lb

## 2013-11-12 DIAGNOSIS — G5702 Lesion of sciatic nerve, left lower limb: Secondary | ICD-10-CM

## 2013-11-12 DIAGNOSIS — G5700 Lesion of sciatic nerve, unspecified lower limb: Secondary | ICD-10-CM

## 2013-11-12 DIAGNOSIS — T7840XA Allergy, unspecified, initial encounter: Secondary | ICD-10-CM

## 2013-11-12 MED ORDER — PREDNISONE 20 MG PO TABS: ORAL_TABLET | ORAL | Status: DC

## 2013-11-12 MED ORDER — ATENOLOL 25 MG PO TABS: 25.0000 mg | ORAL_TABLET | Freq: Every day | ORAL | Status: DC

## 2013-11-12 MED ORDER — HYDROCHLOROTHIAZIDE 25 MG PO TABS: 25.0000 mg | ORAL_TABLET | Freq: Every day | ORAL | Status: DC

## 2013-11-12 NOTE — Assessment & Plan Note (Signed)
Course of prednisone to decrease inflammation Discussed piriformis syndrome Call or return to clinic prn if these symptoms worsen or fail to improve as anticipated.

## 2013-11-12 NOTE — Progress Notes (Signed)
Subjective:    Patient ID: Nicole Daniels, female    DOB: 03/14/1951, 63 y.o.   MRN: [id number]  HPI  63 yo here for:  1.  Nausea, itching- started shortly after starting Bactrim for UTI last week.  Urine cx neg but was still taking because she felt it helped with her sinuses. Never had this reaction with sulfa in past.  No difficulty breathing, wheezing or rashes.  2. Left leg pain- starts in buttocks and goes down leg.  Feels like pain/burning.  Worse when she has been sitting for longer periods of time and then starts walking.  No LE weakness.  Patient Active Problem List   Diagnosis Date Noted  . Piriformis syndrome of left side 11/12/2013  . Allergic reaction 11/12/2013  . UTI (urinary tract infection) 11/05/2013  . HYPERTENSION, UNSPECIFIED 12/06/2010  . MIGRAINE, CHRONIC 11/24/2010  . UNSPECIFIED VITAMIN D DEFICIENCY 04/08/2010  . DEPRESSION, MILD 03/09/2010  . RENAL CYST 12/22/2009  . SINUSITIS - ACUTE-NOS 09/20/2009  . SINUSITIS, CHRONIC 08/13/2008  . OBESITY 08/11/2008  . NEOPLASM, MALIGNANT, SKIN, FACE 12/01/2007  . PATENT FORAMEN OVALE 01/28/2005  . HYPERLIPIDEMIA 03/30/2001   Past Medical History  Diagnosis Date  . Chest pain, unspecified   . Dizziness and giddiness   . Dysfunction of eustachian tube   . Other malaise and fatigue   . Other and unspecified hyperlipidemia   . Unspecified essential hypertension   . Migraine, unspecified, without mention of intractable migraine without mention of status migrainosus   . Obesity, unspecified   . Neoplasm of skin of face     malignant  . Palpitations   . Ostium secundum type atrial septal defect   . Abdominal pain, right lower quadrant   . Shortness of breath   . Acute sinusitis, unspecified   . Unspecified sinusitis (chronic)   . Unspecified vitamin D deficiency   . OA (osteoarthritis)     knee  . Angiomyolipoma     kidney  . Liver lesion 2011  . Diverticulosis   . Adenomatous colon polyp   . IBS  (irritable bowel syndrome)   . History of diverticulitis of colon   . Hemorrhoids   . Acquired cyst of kidney 01/08/2012    pt denies history of   . Depressive disorder, not elsewhere classified 01/08/2012    not currently    Past Surgical History  Procedure Laterality Date  . Partial hysterectomy      right ovary remains  . Dilation and curettage of uterus    . Cholecystectomy    . Breast reduction surgery  1990  . Tonsillectomy  1972  . Left oophorectomy  1976  . Knee arthrotomy      right  . Abdominal hysterectomy    . Flexible sigmoidoscopy  01/08/2012    Procedure: FLEXIBLE SIGMOIDOSCOPY;  Surgeon: Lafayette Dragon, MD;  Location: [facility];  Service: Endoscopy;  Laterality: N/A;   History  Substance Use Topics  . Smoking status: Never Smoker   . Smokeless tobacco: Never Used  . Alcohol Use: No   Family History  Problem Relation Age of Onset  . Breast cancer Cousin   . Cirrhosis Father   . Hypertension Mother   . Hypertension Sister   . Diabetes Mother   . Diabetes Cousin   . Diabetes      aunt  . Stroke      aunt  . Heart disease Father   . Heart disease Brother  x 2  . Colon cancer Neg Hx    Allergies  Allergen Reactions  . Bactrim [Sulfamethoxazole-Tmp Ds]     itching  . Simvastatin Other (See Comments)    Leg pain   Current Outpatient Prescriptions on File Prior to Visit  Medication Sig Dispense Refill  . aspirin 325 MG EC tablet 325 mg. Take 4 aspirin daily.      Marland Kitchen atenolol (TENORMIN) 25 MG tablet TAKE 1 TABLET BY MOUTH EVERY DAY  30 tablet  2  . fluticasone (FLONASE) 50 MCG/ACT nasal spray 2 sprays each nostril qd  16 g  2  . hydrochlorothiazide (HYDRODIURIL) 25 MG tablet TAKE 1 TABLET (25 MG TOTAL) BY MOUTH AT BEDTIME.  30 tablet  6  . nortriptyline (PAMELOR) 25 MG capsule TAKE 3 CAPSULES BY MOUTH AT NIGHT  30 capsule  0   No current facility-administered medications on file prior to visit.   The PMH, PSH, Social History, Family History,  Medications, and allergies have been reviewed in [facility], and have been updated if relevant.    Review of Systems  Constitutional: Negative for fever and fatigue.  HENT: Negative for sinus pressure.   Genitourinary: Negative for dysuria.  Neurological: Negative for weakness.       Objective:   Physical Exam  Constitutional: She appears well-developed and well-nourished. No distress.  HENT:  Head: Normocephalic.  Musculoskeletal:  SLR neg bilaterally Pos Fabers left, neg right Normal gait Normal strength and reflexes bilaterally  Skin: Skin is warm, dry and intact.  Psychiatric: She has a normal mood and affect. Her speech is normal and behavior is normal. Judgment and thought content normal. Cognition and memory are normal.          Assessment & Plan:

## 2013-11-12 NOTE — Progress Notes (Signed)
Pre-visit discussion using our clinic review tool. No additional management support is needed unless otherwise documented below in the visit note.  

## 2013-11-12 NOTE — Assessment & Plan Note (Signed)
Neg urine cx. No further tx needed.

## 2013-11-12 NOTE — Patient Instructions (Signed)
It was good to see you. You have piriformis syndrome. Try icing the area several times per day. Take prednisone as directed- in the morning and with food. DO NOT TAKE Ibuprofen or alleve with this.  Call me with an update.

## 2013-11-12 NOTE — Assessment & Plan Note (Signed)
D/c bactrim. Added bactrim to allery list. Advised benadryl prn itching.

## 2013-12-23 ENCOUNTER — Telehealth: Payer: Self-pay | Admitting: Family Medicine

## 2013-12-23 ENCOUNTER — Ambulatory Visit: Payer: BC Managed Care – PPO | Admitting: Family Medicine

## 2013-12-23 MED ORDER — AMOXICILLIN 500 MG PO CAPS: 1000.0000 mg | ORAL_CAPSULE | Freq: Two times a day (BID) | ORAL | Status: DC

## 2013-12-23 NOTE — Telephone Encounter (Signed)
rx sent to pharmacy by e-script Spoke with patient and advised results   

## 2013-12-23 NOTE — Telephone Encounter (Signed)
Patient Information:  Caller Name: Fraser Din  Phone: [phone]  Patient: Nicole Daniels, Nicole Daniels  Gender: Female  DOB: April 05, 1951  Age: 63 Years  PCP: Arnette Norris [facility])  Office Follow Up:  Does the office need to follow up with this patient?: Yes  Instructions For The Office: Call patient to give further instructions.  RN Note:  Patient would like to know if anything can be called in for her symptoms today. Please contact her to give further instructions.  Symptoms  Reason For Call & Symptoms: Sinus pain/pressure, nasal congestion, runny nose, drainage down back of throat with cough as well, ear congestion. Appointment that she had scheduled was cancelled today due to weather.  Reviewed Health History In EMR: Yes  Reviewed Medications In EMR: Yes  Reviewed Allergies In EMR: Yes  Reviewed Surgeries / Procedures: Yes  Date of Onset of Symptoms: 12/19/2013  Treatments Tried: OTC allergy medicine  Treatments Tried Worked: No  Guideline(s) Used:  Sinus Pain and Congestion  Disposition Per Guideline:   See Today in Office  Reason For Disposition Reached:   Earache  Advice Given:  N/A  Patient Will Follow Care Advice:  YES

## 2013-12-23 NOTE — Telephone Encounter (Signed)
Okay to send Rx for amoxicillin 500mg  #40 x 0 2 tabs bid

## 2014-03-09 ENCOUNTER — Other Ambulatory Visit: Payer: Self-pay | Admitting: Family Medicine

## 2014-07-31 ENCOUNTER — Encounter: Payer: Self-pay | Admitting: Internal Medicine

## 2014-08-18 ENCOUNTER — Encounter: Payer: Self-pay | Admitting: Family Medicine

## 2014-08-18 ENCOUNTER — Ambulatory Visit (INDEPENDENT_AMBULATORY_CARE_PROVIDER_SITE_OTHER): Payer: BC Managed Care – PPO | Admitting: Family Medicine

## 2014-08-18 VITALS — BP 124/82 | HR 80 | Temp 98.0°F | Wt 192.8 lb

## 2014-08-18 DIAGNOSIS — J0121 Acute recurrent ethmoidal sinusitis: Secondary | ICD-10-CM

## 2014-08-18 MED ORDER — AZITHROMYCIN 250 MG PO TABS: ORAL_TABLET | ORAL | Status: DC

## 2014-08-18 NOTE — Progress Notes (Signed)
Pre visit review using our clinic review tool, if applicable. No additional management support is needed unless otherwise documented below in the visit note. 

## 2014-08-18 NOTE — Progress Notes (Signed)
SUBJECTIVE:  Nicole Daniels is a 63 y.o. female who complains of coryza, congestion, sneezing, sore throat and bilateral sinus pain for 10 days. She denies a history of anorexia, chest pain, chills and fevers and denies a history of asthma. Patient denies smoke cigarettes.   Patient Active Problem List   Diagnosis Date Noted  . Piriformis syndrome of left side 11/12/2013  . Allergic reaction 11/12/2013  . UTI (urinary tract infection) 11/05/2013  . HYPERTENSION, UNSPECIFIED 12/06/2010  . MIGRAINE, CHRONIC 11/24/2010  . UNSPECIFIED VITAMIN D DEFICIENCY 04/08/2010  . DEPRESSION, MILD 03/09/2010  . RENAL CYST 12/22/2009  . SINUSITIS - ACUTE-NOS 09/20/2009  . SINUSITIS, CHRONIC 08/13/2008  . OBESITY 08/11/2008  . NEOPLASM, MALIGNANT, SKIN, FACE 12/01/2007  . PATENT FORAMEN OVALE 01/28/2005  . HYPERLIPIDEMIA 03/30/2001   Past Medical History  Diagnosis Date  . Chest pain, unspecified   . Dizziness and giddiness   . Dysfunction of eustachian tube   . Other malaise and fatigue   . Other and unspecified hyperlipidemia   . Unspecified essential hypertension   . Migraine, unspecified, without mention of intractable migraine without mention of status migrainosus   . Obesity, unspecified   . Neoplasm of skin of face     malignant  . Palpitations   . Ostium secundum type atrial septal defect   . Abdominal pain, right lower quadrant   . Shortness of breath   . Acute sinusitis, unspecified   . Unspecified sinusitis (chronic)   . Unspecified vitamin D deficiency   . OA (osteoarthritis)     knee  . Angiomyolipoma     kidney  . Liver lesion 2011  . Diverticulosis   . Adenomatous colon polyp   . IBS (irritable bowel syndrome)   . History of diverticulitis of colon   . Hemorrhoids   . Acquired cyst of kidney 01/08/2012    pt denies history of   . Depressive disorder, not elsewhere classified 01/08/2012    not currently    Past Surgical History  Procedure Laterality Date  .  Partial hysterectomy      right ovary remains  . Dilation and curettage of uterus    . Cholecystectomy    . Breast reduction surgery  1990  . Tonsillectomy  1972  . Left oophorectomy  1976  . Knee arthrotomy      right  . Abdominal hysterectomy    . Flexible sigmoidoscopy  01/08/2012    Procedure: FLEXIBLE SIGMOIDOSCOPY;  Surgeon: Lafayette Dragon, MD;  Location: [facility];  Service: Endoscopy;  Laterality: N/A;   History  Substance Use Topics  . Smoking status: Never Smoker   . Smokeless tobacco: Never Used  . Alcohol Use: No   Family History  Problem Relation Age of Onset  . Breast cancer Cousin   . Cirrhosis Father   . Hypertension Mother   . Hypertension Sister   . Diabetes Mother   . Diabetes Cousin   . Diabetes      aunt  . Stroke      aunt  . Heart disease Father   . Heart disease Brother     x 2  . Colon cancer Neg Hx    Allergies  Allergen Reactions  . Bactrim [Sulfamethoxazole-Tmp Ds]     itching  . Simvastatin Other (See Comments)    Leg pain   Current Outpatient Prescriptions on File Prior to Visit  Medication Sig Dispense Refill  . aspirin 325 MG EC tablet 325 mg. Take 4  aspirin daily.      Marland Kitchen atenolol (TENORMIN) 25 MG tablet TAKE 1 TABLET BY MOUTH EVERY DAY  30 tablet  0  . fluticasone (FLONASE) 50 MCG/ACT nasal spray 2 sprays each nostril qd  16 g  2   No current facility-administered medications on file prior to visit.   The PMH, PSH, Social History, Family History, Medications, and allergies have been reviewed in [facility], and have been updated if relevant.  OBJECTIVE: BP 124/82  Pulse 80  Temp(Src) 98 F (36.7 C) (Oral)  Wt 192 lb 12 oz (87.431 kg)  SpO2 96%  She appears well, vital signs are as noted. Ears normal.  Throat and pharynx normal.  Neck supple. No adenopathy in the neck. Nose is congested. Sinuses  tender. The chest is clear, without wheezes or rales.  ASSESSMENT:  sinusitis  PLAN: Given duration and progression of symptoms,  will treat for bacterial sinusitis. Symptomatic therapy suggested: push fluids, rest and return office visit prn if symptoms persist or worsen.. Call or return to clinic prn if these symptoms worsen or fail to improve as anticipated.

## 2014-08-18 NOTE — Patient Instructions (Signed)
Good to see you. Take Zpack as directed. Drink plenty of fluids.  Call me with an update later this week.

## 2014-12-13 ENCOUNTER — Other Ambulatory Visit: Payer: Self-pay | Admitting: Family Medicine

## 2014-12-14 ENCOUNTER — Encounter: Payer: Self-pay | Admitting: Family Medicine

## 2014-12-14 ENCOUNTER — Other Ambulatory Visit: Payer: Self-pay | Admitting: Family Medicine

## 2014-12-14 ENCOUNTER — Ambulatory Visit (INDEPENDENT_AMBULATORY_CARE_PROVIDER_SITE_OTHER): Payer: BC Managed Care – PPO | Admitting: Family Medicine

## 2014-12-14 VITALS — BP 120/82 | HR 69 | Temp 98.9°F | Ht 65.0 in | Wt 191.8 lb

## 2014-12-14 DIAGNOSIS — J0101 Acute recurrent maxillary sinusitis: Secondary | ICD-10-CM

## 2014-12-14 MED ORDER — AMOXICILLIN-POT CLAVULANATE 875-125 MG PO TABS: 1.0000 | ORAL_TABLET | Freq: Two times a day (BID) | ORAL | Status: DC

## 2014-12-14 NOTE — Patient Instructions (Signed)
Drink lots of fluids  Take augmentin as directed for sinus infection  Try mucinex with lots of water to loosen crud in head  Use warm compresses on face  Also breathe steam / try nasal saline spray or a netti pot for congestion   Update if not starting to improve in a week or if worsening

## 2014-12-14 NOTE — Progress Notes (Signed)
Subjective:    Patient ID: Nicole Daniels, female    DOB: 1951/08/13, 64 y.o.   MRN: [id number]  HPI Here for sinusitis symptoms  Hx of frequent sinusitis   This bout started fri late  Headaches achey -especially in neck - on the L side  Sinus pressure -over maxillary area/cheeks - not worse on one side  A bit light headed  Blowing out a bit of yellow mucous  A slt cough -not bad  Some post nasal drip  A little sore throat  No fever   Does not think this is a cold in light of facial pain that radiates into upper teeth   Patient Active Problem List   Diagnosis Date Noted  . Piriformis syndrome of left side 11/12/2013  . Allergic reaction 11/12/2013  . UTI (urinary tract infection) 11/05/2013  . HYPERTENSION, UNSPECIFIED 12/06/2010  . MIGRAINE, CHRONIC 11/24/2010  . UNSPECIFIED VITAMIN D DEFICIENCY 04/08/2010  . DEPRESSION, MILD 03/09/2010  . RENAL CYST 12/22/2009  . SINUSITIS - ACUTE-NOS 09/20/2009  . SINUSITIS, CHRONIC 08/13/2008  . OBESITY 08/11/2008  . NEOPLASM, MALIGNANT, SKIN, FACE 12/01/2007  . PATENT FORAMEN OVALE 01/28/2005  . HYPERLIPIDEMIA 03/30/2001   Past Medical History  Diagnosis Date  . Chest pain, unspecified   . Dizziness and giddiness   . Dysfunction of eustachian tube   . Other malaise and fatigue   . Other and unspecified hyperlipidemia   . Unspecified essential hypertension   . Migraine, unspecified, without mention of intractable migraine without mention of status migrainosus   . Obesity, unspecified   . Neoplasm of skin of face     malignant  . Palpitations   . Ostium secundum type atrial septal defect   . Abdominal pain, right lower quadrant   . Shortness of breath   . Acute sinusitis, unspecified   . Unspecified sinusitis (chronic)   . Unspecified vitamin D deficiency   . OA (osteoarthritis)     knee  . Angiomyolipoma     kidney  . Liver lesion 2011  . Diverticulosis   . Adenomatous colon polyp   . IBS (irritable bowel  syndrome)   . History of diverticulitis of colon   . Hemorrhoids   . Acquired cyst of kidney 01/08/2012    pt denies history of   . Depressive disorder, not elsewhere classified 01/08/2012    not currently    Past Surgical History  Procedure Laterality Date  . Partial hysterectomy      right ovary remains  . Dilation and curettage of uterus    . Cholecystectomy    . Breast reduction surgery  1990  . Tonsillectomy  1972  . Left oophorectomy  1976  . Knee arthrotomy      right  . Abdominal hysterectomy    . Flexible sigmoidoscopy  01/08/2012    Procedure: FLEXIBLE SIGMOIDOSCOPY;  Surgeon: Lafayette Dragon, MD;  Location: [facility];  Service: Endoscopy;  Laterality: N/A;   History  Substance Use Topics  . Smoking status: Never Smoker   . Smokeless tobacco: Never Used  . Alcohol Use: No   Family History  Problem Relation Age of Onset  . Breast cancer Cousin   . Cirrhosis Father   . Hypertension Mother   . Hypertension Sister   . Diabetes Mother   . Diabetes Cousin   . Diabetes      aunt  . Stroke      aunt  . Heart disease Father   . Heart  disease Brother     x 2  . Colon cancer Neg Hx    Allergies  Allergen Reactions  . Bactrim [Sulfamethoxazole-Trimethoprim]     itching  . Simvastatin Other (See Comments)    Leg pain   Current Outpatient Prescriptions on File Prior to Visit  Medication Sig Dispense Refill  . aspirin 325 MG EC tablet 325 mg. Take 4 aspirin daily.    Marland Kitchen atenolol (TENORMIN) 25 MG tablet TAKE 1 TABLET BY MOUTH EVERY DAY 30 tablet 0  . hydrochlorothiazide (HYDRODIURIL) 25 MG tablet Take 25 mg by mouth daily.     No current facility-administered medications on file prior to visit.        Review of Systems    Review of Systems  Constitutional: Negative for fever, appetite change,  and unexpected weight change.  ENT pos for nasal cong and facial pain  Eyes: Negative for pain and visual disturbance.  Respiratory: Negative for wheeze  and  shortness of breath.   Cardiovascular: Negative for cp or palpitations    Gastrointestinal: Negative for nausea, diarrhea and constipation.  Genitourinary: Negative for urgency and frequency.  Skin: Negative for pallor or rash   Neurological: Negative for weakness, light-headedness, numbness and headaches.  Hematological: Negative for adenopathy. Does not bruise/bleed easily.  Psychiatric/Behavioral: Negative for dysphoric mood. The patient is not nervous/anxious.      Objective:   Physical Exam  Constitutional: She appears well-developed and well-nourished. No distress.  obese and well appearing   HENT:  Head: Normocephalic and atraumatic.  Right Ear: External ear normal.  Left Ear: External ear normal.  Mouth/Throat: Oropharynx is clear and moist.  Nares are injected and congested  bilatral marked maxillary sinus tenderness Throat is clear     Eyes: Conjunctivae and EOM are normal. Pupils are equal, round, and reactive to light. Right eye exhibits no discharge. Left eye exhibits no discharge.  Neck: Normal range of motion. Neck supple.  Cardiovascular: Normal rate and regular rhythm.   Pulmonary/Chest: Effort normal and breath sounds normal. No respiratory distress. She has no wheezes. She has no rales.  Lymphadenopathy:    She has no cervical adenopathy.  Neurological: She is alert. No cranial nerve deficit.  Skin: Skin is warm and dry. No rash noted.  Psychiatric: She has a normal mood and affect.          Assessment & Plan:   Problem List Items Addressed This Visit      Respiratory   Acute sinusitis - Primary    Recurrent with maxillary pain and pressure  Suspect baseline allergy congestion  Disc symptomatic care - see instructions on AVS - nasal saline/mucinex  Cover with augmentin  Update if not starting to improve in a week or if worsening    Did rev CT sinuses 2013 -was nl   Did disc diff between a viral uri and sinus infection       Relevant  Medications   amoxicillin-clavulanate (AUGMENTIN) tablet 875-125 mg

## 2014-12-14 NOTE — Progress Notes (Signed)
Pre visit review using our clinic review tool, if applicable. No additional management support is needed unless otherwise documented below in the visit note. 

## 2014-12-14 NOTE — Assessment & Plan Note (Signed)
Recurrent with maxillary pain and pressure  Suspect baseline allergy congestion  Disc symptomatic care - see instructions on AVS - nasal saline/mucinex  Cover with augmentin  Update if not starting to improve in a week or if worsening    Did rev CT sinuses 2013 -was nl   Did disc diff between a viral uri and sinus infection

## 2014-12-19 ENCOUNTER — Other Ambulatory Visit: Payer: Self-pay | Admitting: Family Medicine

## 2015-02-16 ENCOUNTER — Other Ambulatory Visit: Payer: Self-pay | Admitting: Family Medicine

## 2015-02-19 ENCOUNTER — Encounter: Payer: Self-pay | Admitting: Internal Medicine

## 2015-06-30 ENCOUNTER — Other Ambulatory Visit: Payer: Self-pay

## 2015-06-30 ENCOUNTER — Telehealth: Payer: Self-pay

## 2015-06-30 DIAGNOSIS — Z1231 Encounter for screening mammogram for malignant neoplasm of breast: Secondary | ICD-10-CM

## 2015-06-30 NOTE — Telephone Encounter (Signed)
I called patient to remind her of being due for a mammogram. Patient states that she will get one scheduled as soon as she can at the [facility].

## 2015-07-08 ENCOUNTER — Ambulatory Visit
Admission: RE | Admit: 2015-07-08 | Discharge: 2015-07-08 | Disposition: A | Discharge status: Home / Self Care | Payer: Medicare Other | Source: Ambulatory Visit

## 2015-07-08 DIAGNOSIS — Z1231 Encounter for screening mammogram for malignant neoplasm of breast: Secondary | ICD-10-CM

## 2015-07-13 ENCOUNTER — Other Ambulatory Visit: Payer: Self-pay | Admitting: Family Medicine

## 2015-07-22 ENCOUNTER — Encounter: Payer: Self-pay | Admitting: Internal Medicine

## 2015-08-12 ENCOUNTER — Other Ambulatory Visit: Payer: Self-pay | Admitting: Family Medicine

## 2015-08-12 MED ORDER — ATENOLOL 25 MG PO TABS: 25.0000 mg | ORAL_TABLET | Freq: Every day | ORAL | Status: DC

## 2015-08-12 MED ORDER — HYDROCHLOROTHIAZIDE 25 MG PO TABS: ORAL_TABLET | ORAL | Status: DC

## 2015-09-01 ENCOUNTER — Encounter: Payer: Self-pay | Admitting: Family Medicine

## 2015-09-01 ENCOUNTER — Ambulatory Visit (INDEPENDENT_AMBULATORY_CARE_PROVIDER_SITE_OTHER): Payer: Medicare Other | Admitting: Family Medicine

## 2015-09-01 VITALS — BP 130/72 | HR 62 | Temp 97.6°F | Wt 193.2 lb

## 2015-09-01 DIAGNOSIS — I10 Essential (primary) hypertension: Secondary | ICD-10-CM

## 2015-09-01 DIAGNOSIS — Z23 Encounter for immunization: Secondary | ICD-10-CM

## 2015-09-01 DIAGNOSIS — E785 Hyperlipidemia, unspecified: Secondary | ICD-10-CM | POA: Diagnosis not present

## 2015-09-01 LAB — COMPREHENSIVE METABOLIC PANEL
ALK PHOS: 68 U/L (ref 39–117)
ALT: 14 U/L (ref 0–35)
AST: 16 U/L (ref 0–37)
Albumin: 3.9 g/dL (ref 3.5–5.2)
BUN: 12 mg/dL (ref 6–23)
CALCIUM: 10 mg/dL (ref 8.4–10.5)
CO2: 32 meq/L (ref 19–32)
Chloride: 102 mEq/L (ref 96–112)
Creatinine, Ser: 0.77 mg/dL (ref 0.40–1.20)
GFR: 79.84 mL/min (ref >60.00)
GLUCOSE: 86 mg/dL (ref 70–99)
POTASSIUM: 3.1 meq/L — AB (ref 3.5–5.1)
Sodium: 140 mEq/L (ref 135–145)
Total Bilirubin: 0.5 mg/dL (ref 0.2–1.2)
Total Protein: 7 g/dL (ref 6.0–8.3)

## 2015-09-01 LAB — LIPID PANEL
Cholesterol: 236 mg/dL — ABNORMAL HIGH (ref 0–200)
HDL: 45.8 mg/dL (ref >39.00)
NONHDL: 189.84
TRIGLYCERIDES: 275 mg/dL — AB (ref 0.0–149.0)
Total CHOL/HDL Ratio: 5
VLDL: 55 mg/dL — AB (ref 0.0–40.0)

## 2015-09-01 LAB — LDL CHOLESTEROL, DIRECT: LDL DIRECT: 147 mg/dL

## 2015-09-01 NOTE — Patient Instructions (Signed)
It was great to see you. Have a wonderful holiday.  We will call you with your lab results.

## 2015-09-01 NOTE — Progress Notes (Signed)
Pre visit review using our clinic review tool, if applicable. No additional management support is needed unless otherwise documented below in the visit note. 

## 2015-09-01 NOTE — Assessment & Plan Note (Signed)
Due for labs today. 

## 2015-09-01 NOTE — Assessment & Plan Note (Signed)
Well controlled on current rx. No changes made today. Due for labs. Influenza vaccine given.

## 2015-09-01 NOTE — Progress Notes (Signed)
Subjective:   Patient ID: Nicole Daniels, female    DOB: July 09, 1951, 64 y.o.   MRN: [id number]  Nicole Daniels is a pleasant 64 y.o. year old female who presents to clinic today with Follow-up; Medication Refill; and Headache  on 09/01/2015  HPI:  Doing well.  Husband is recovering from lung CA treatment.  Does not need to have more chemotherapy until after the holidays.  HTN- compliant with rxs- HCTZ 25 mg daily and Atenolol 25 mg daily. Denies HA, blurred vision, CP or LE edema. Lab Results  Component Value Date   CREATININE 0.85 03/09/2013   Lab Results  Component Value Date   CHOL 228* 03/09/2013   HDL 32* 03/09/2013   LDLCALC UNABLE TO CALCULATE IF TRIGLYCERIDE OVER 400 mg/dL 03/09/2013   LDLDIRECT 150.0 03/28/2011   TRIG 401* 03/09/2013   CHOLHDL 7.1 03/09/2013    Current Outpatient Prescriptions on File Prior to Visit  Medication Sig Dispense Refill  . aspirin 325 MG EC tablet 325 mg. Take 4 aspirin daily.    Marland Kitchen atenolol (TENORMIN) 25 MG tablet Take 1 tablet (25 mg total) by mouth daily. 30 tablet 0  . hydrochlorothiazide (HYDRODIURIL) 25 MG tablet TAKE 1 TABLET (25 MG TOTAL) BY MOUTH AT BEDTIME. 30 tablet 0   No current facility-administered medications on file prior to visit.    Allergies  Allergen Reactions  . Bactrim [Sulfamethoxazole-Trimethoprim]     itching  . Simvastatin Other (See Comments)    Leg pain    Past Medical History  Diagnosis Date  . Chest pain, unspecified   . Dizziness and giddiness   . Dysfunction of eustachian tube   . Other malaise and fatigue   . Other and unspecified hyperlipidemia   . Unspecified essential hypertension   . Migraine, unspecified, without mention of intractable migraine without mention of status migrainosus   . Obesity, unspecified   . Neoplasm of skin of face     malignant  . Palpitations   . Ostium secundum type atrial septal defect   . Abdominal pain, right lower quadrant   . Shortness of breath    . Acute sinusitis, unspecified   . Unspecified sinusitis (chronic)   . Unspecified vitamin D deficiency   . OA (osteoarthritis)     knee  . Angiomyolipoma     kidney  . Liver lesion 2011  . Diverticulosis   . Adenomatous colon polyp   . IBS (irritable bowel syndrome)   . History of diverticulitis of colon   . Hemorrhoids   . Acquired cyst of kidney 01/08/2012    pt denies history of   . Depressive disorder, not elsewhere classified 01/08/2012    not currently     Past Surgical History  Procedure Laterality Date  . Partial hysterectomy      right ovary remains  . Dilation and curettage of uterus    . Cholecystectomy    . Breast reduction surgery  1990  . Tonsillectomy  1972  . Left oophorectomy  1976  . Knee arthrotomy      right  . Abdominal hysterectomy    . Flexible sigmoidoscopy  01/08/2012    Procedure: FLEXIBLE SIGMOIDOSCOPY;  Surgeon: Lafayette Dragon, MD;  Location: [facility];  Service: Endoscopy;  Laterality: N/A;    Family History  Problem Relation Age of Onset  . Breast cancer Cousin   . Cirrhosis Father   . Hypertension Mother   . Hypertension Sister   . Diabetes Mother   .  Diabetes Cousin   . Diabetes      aunt  . Stroke      aunt  . Heart disease Father   . Heart disease Brother     x 2  . Colon cancer Neg Hx     Social History   Social History  . Marital Status: Married    Spouse Name: N/A  . Number of Children: 2  . Years of Education: N/A   Occupational History  . 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 Smoking status: Never Smoker   . Smokeless tobacco: Never Used  . Alcohol Use: No  . Drug Use: No  . Sexual Activity: Not on file   Other Topics Concern  . Not on file   Social History Narrative   Daily caffeine    The PMH, PSH, Social History, Family History, Medications, and allergies have been reviewed in [facility], and have been updated if relevant.   Review of Systems  Constitutional: Negative.     HENT: Negative.   Respiratory: Negative.   Cardiovascular: Negative.   Gastrointestinal: Negative.   Endocrine: Negative.   Genitourinary: Negative.   Musculoskeletal: Negative.   Skin: Negative.   Allergic/Immunologic: Negative.   Neurological: Negative.   Hematological: Negative.   Psychiatric/Behavioral: Negative.   All other systems reviewed and are negative.      Objective:    BP 130/72 mmHg  Pulse 62  Temp(Src) 97.6 F (36.4 C) (Oral)  Wt 193 lb 4 oz (87.658 kg)  SpO2 96%   Physical Exam  Constitutional: She is oriented to person, place, and time. She appears well-developed and well-nourished. No distress.  HENT:  Head: Normocephalic.  Neck: Normal range of motion.  Cardiovascular: Normal rate and regular rhythm.   Pulmonary/Chest: Effort normal and breath sounds normal.  Musculoskeletal: Normal range of motion. She exhibits no edema.  Neurological: She is alert and oriented to person, place, and time. No cranial nerve deficit.  Skin: Skin is warm and dry.  Psychiatric: She has a normal mood and affect. Her behavior is normal. Judgment and thought content normal.  Nursing note and vitals reviewed.         Assessment & Plan:   Essential hypertension  Need for influenza vaccination - Plan: Flu Vaccine QUAD 36+ mos PF IM (Fluarix & Fluzone Quad PF) No Follow-up on file.

## 2015-09-12 ENCOUNTER — Other Ambulatory Visit: Payer: Self-pay | Admitting: Family Medicine

## 2015-09-17 ENCOUNTER — Telehealth: Payer: Self-pay | Admitting: Family Medicine

## 2015-09-17 DIAGNOSIS — H2513 Age-related nuclear cataract, bilateral: Secondary | ICD-10-CM | POA: Diagnosis not present

## 2015-09-17 DIAGNOSIS — H524 Presbyopia: Secondary | ICD-10-CM | POA: Diagnosis not present

## 2015-09-17 DIAGNOSIS — H25033 Anterior subcapsular polar age-related cataract, bilateral: Secondary | ICD-10-CM | POA: Diagnosis not present

## 2015-09-17 DIAGNOSIS — H00029 Hordeolum internum unspecified eye, unspecified eyelid: Secondary | ICD-10-CM | POA: Diagnosis not present

## 2015-09-17 NOTE — Telephone Encounter (Signed)
Patient Name: Nicole Daniels  DOB: 01/14/1951    Initial Comment Caller states she has sinus pressure and congestion, low fever, cough   Nurse Assessment  Nurse: Raphael Gibney, RN, Vera Date/Time (Eastern Time): 09/17/2015 2:32:45 PM  Confirm and document reason for call. If symptomatic, describe symptoms. ---Caller states she has sinus pressure and congestion. Throat feels raw. Left lymph gland is a little swollen and tender. She is coughing up yellow phlegm. Temp around 100. Symptoms started Tuesday night.  Has the patient traveled out of the country within the last 30 days? ---No  Does the patient have any new or worsening symptoms? ---Yes  Will a triage be completed? ---Yes  Related visit to physician within the last 2 weeks? ---No  Does the PT have any chronic conditions? (i.e. diabetes, asthma, etc.) ---Yes  List chronic conditions. ---HTN  Is this a behavioral health call? ---No     Guidelines    Guideline Title Affirmed Question Affirmed Notes  Sinus Pain or Congestion Earache    Final Disposition User   See Physician within 24 Hours Stringer, RN, Vanita Ingles    Comments  Pt does not want to make appt or go to urgent care as she was at the doctor last week for sinus congestion. She would like a Z pak called into the [facility]. Please call pt and let her know if medication will be called in.   Referrals  GO TO FACILITY REFUSED   Disagree/Comply: Disagree  Disagree/Comply Reason: Disagree with instructions

## 2015-09-17 NOTE — Telephone Encounter (Signed)
Where was she seen? She was not seen here. Seen 11/2 for HTN followup with no mention of sinus issue . If she wants abx, she will need appt.

## 2015-09-20 NOTE — Telephone Encounter (Signed)
Lm on pts vm and informed her OV is required for abx

## 2015-09-21 ENCOUNTER — Ambulatory Visit (INDEPENDENT_AMBULATORY_CARE_PROVIDER_SITE_OTHER): Payer: Medicare Other | Admitting: Family Medicine

## 2015-09-21 ENCOUNTER — Encounter: Payer: Self-pay | Admitting: Family Medicine

## 2015-09-21 VITALS — BP 128/62 | HR 60 | Temp 97.8°F | Wt 189.5 lb

## 2015-09-21 DIAGNOSIS — J0110 Acute frontal sinusitis, unspecified: Secondary | ICD-10-CM

## 2015-09-21 MED ORDER — AZITHROMYCIN 250 MG PO TABS: ORAL_TABLET | ORAL | Status: DC

## 2015-09-21 NOTE — Progress Notes (Signed)
Pre visit review using our clinic review tool, if applicable. No additional management support is needed unless otherwise documented below in the visit note. 

## 2015-09-21 NOTE — Progress Notes (Signed)
SUBJECTIVE:  Nicole Daniels is a 64 y.o. female who complains of congestion, sore throat, productive cough and bilateral sinus pain for 8 days. She denies a history of anorexia and chest pain and denies a history of asthma. Patient denies smoke cigarettes.   Current Outpatient Prescriptions on File Prior to Visit  Medication Sig Dispense Refill  . aspirin 325 MG EC tablet 325 mg. Take 4 aspirin daily.    Marland Kitchen atenolol (TENORMIN) 25 MG tablet TAKE 1 TABLET (25 MG TOTAL) BY MOUTH DAILY. 30 tablet 11  . hydrochlorothiazide (HYDRODIURIL) 25 MG tablet TAKE 1 TABLET (25 MG TOTAL) BY MOUTH AT BEDTIME. 30 tablet 11   No current facility-administered medications on file prior to visit.    Allergies  Allergen Reactions  . Bactrim [Sulfamethoxazole-Trimethoprim]     itching  . Simvastatin Other (See Comments)    Leg pain    Past Medical History  Diagnosis Date  . Chest pain, unspecified   . Dizziness and giddiness   . Dysfunction of eustachian tube   . Other malaise and fatigue   . Other and unspecified hyperlipidemia   . Unspecified essential hypertension   . Migraine, unspecified, without mention of intractable migraine without mention of status migrainosus   . Obesity, unspecified   . Neoplasm of skin of face     malignant  . Palpitations   . Ostium secundum type atrial septal defect   . Abdominal pain, right lower quadrant   . Shortness of breath   . Acute sinusitis, unspecified   . Unspecified sinusitis (chronic)   . Unspecified vitamin D deficiency   . OA (osteoarthritis)     knee  . Angiomyolipoma     kidney  . Liver lesion 2011  . Diverticulosis   . Adenomatous colon polyp   . IBS (irritable bowel syndrome)   . History of diverticulitis of colon   . Hemorrhoids   . Acquired cyst of kidney 01/08/2012    pt denies history of   . Depressive disorder, not elsewhere classified 01/08/2012    not currently     Past Surgical History  Procedure Laterality Date  .  Partial hysterectomy      right ovary remains  . Dilation and curettage of uterus    . Cholecystectomy    . Breast reduction surgery  1990  . Tonsillectomy  1972  . Left oophorectomy  1976  . Knee arthrotomy      right  . Abdominal hysterectomy    . Flexible sigmoidoscopy  01/08/2012    Procedure: FLEXIBLE SIGMOIDOSCOPY;  Surgeon: Lafayette Dragon, MD;  Location: [facility];  Service: Endoscopy;  Laterality: N/A;    Family History  Problem Relation Age of Onset  . Breast cancer Cousin   . Cirrhosis Father   . Hypertension Mother   . Hypertension Sister   . Diabetes Mother   . Diabetes Cousin   . Diabetes      aunt  . Stroke      aunt  . Heart disease Father   . Heart disease Brother     x 2  . Colon cancer Neg Hx     Social History   Social History  . Marital Status: Married    Spouse Name: N/A  . Number of Children: 2  . 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 Smoking status: Never Smoker   . Smokeless  tobacco: Never Used  . Alcohol Use: No  . Drug Use: No  . Sexual Activity: Not on file   Other Topics Concern  . Not on file   Social History Narrative   Daily caffeine    The PMH, PSH, Social History, Family History, Medications, and allergies have been reviewed in [facility], and have been updated if relevant.  OBJECTIVE: BP 128/62 mmHg  Pulse 60  Temp(Src) 97.8 F (36.6 C) (Oral)  Wt 189 lb 8 oz (85.957 kg)  SpO2 98%  She appears well, vital signs are as noted. Ears normal.  Throat and pharynx normal.  Neck supple. No adenopathy in the neck. Nose is congested. Sinuses tender. The chest is clear, without wheezes or rales.  ASSESSMENT:  sinusitis  PLAN: Given duration and progression of symptoms, will treat for bacterial sinusitis.  Symptomatic therapy suggested: push fluids, rest and return office visit prn if symptoms persist or worsen. Lack of antibiotic effectiveness  discussed with her. Call or return to clinic prn if these symptoms worsen or fail to improve as anticipated.

## 2015-09-21 NOTE — Patient Instructions (Signed)
Happy Thanksgiving!

## 2015-12-17 DIAGNOSIS — J301 Allergic rhinitis due to pollen: Secondary | ICD-10-CM | POA: Diagnosis not present

## 2015-12-17 DIAGNOSIS — H9313 Tinnitus, bilateral: Secondary | ICD-10-CM | POA: Diagnosis not present

## 2015-12-17 DIAGNOSIS — H903 Sensorineural hearing loss, bilateral: Secondary | ICD-10-CM | POA: Diagnosis not present

## 2016-06-02 ENCOUNTER — Other Ambulatory Visit: Payer: Self-pay | Admitting: Family Medicine

## 2016-06-02 DIAGNOSIS — Z1231 Encounter for screening mammogram for malignant neoplasm of breast: Secondary | ICD-10-CM

## 2016-07-10 ENCOUNTER — Ambulatory Visit
Admission: RE | Admit: 2016-07-10 | Discharge: 2016-07-10 | Disposition: A | Discharge status: Home / Self Care | Payer: Medicare Other | Source: Ambulatory Visit | Attending: Family Medicine | Admitting: Family Medicine

## 2016-07-10 DIAGNOSIS — Z1231 Encounter for screening mammogram for malignant neoplasm of breast: Secondary | ICD-10-CM | POA: Diagnosis not present

## 2016-09-05 ENCOUNTER — Ambulatory Visit (INDEPENDENT_AMBULATORY_CARE_PROVIDER_SITE_OTHER): Payer: Medicare Other | Admitting: Family Medicine

## 2016-09-05 ENCOUNTER — Encounter: Payer: Self-pay | Admitting: Family Medicine

## 2016-09-05 VITALS — BP 126/58 | HR 61 | Temp 97.7°F | Wt 184.5 lb

## 2016-09-05 DIAGNOSIS — J0110 Acute frontal sinusitis, unspecified: Secondary | ICD-10-CM

## 2016-09-05 MED ORDER — AMOXICILLIN-POT CLAVULANATE 875-125 MG PO TABS: 1.0000 | ORAL_TABLET | Freq: Two times a day (BID) | ORAL | 0 refills | Status: DC

## 2016-09-05 NOTE — Progress Notes (Signed)
Pre visit review using our clinic review tool, if applicable. No additional management support is needed unless otherwise documented below in the visit note. 

## 2016-09-05 NOTE — Patient Instructions (Signed)
Take antibiotic as directed.  Drink lots of fluids.    Treat sympotmatically with Mucinex, nasal saline irrigation, and Tylenol/Ibuprofen.   Also try an antihistamine/decongestant like claritin D or zyrtec D over the counter- two times a day as needed ( have to sign for them at pharmacy).   Try over the counter nasocort-start with 2 sprays per nostril per day...and then try to taper to 1 spray per nostril once symptoms improve.   You can use warm compresses.    Call if not improving as expected in 5-7 days.    

## 2016-09-05 NOTE — Progress Notes (Signed)
SUBJECTIVE:  Nicole Daniels is a 65 y.o. female who complains of coryza, congestion and bilateral sinus pain for 8 days. She denies a history of anorexia and chest pain and denies a history of asthma. Patient denies smoke cigarettes.   Current Outpatient Prescriptions on File Prior to Visit  Medication Sig Dispense Refill  . aspirin 325 MG EC tablet 325 mg. Take 4 aspirin daily.    Marland Kitchen atenolol (TENORMIN) 25 MG tablet TAKE 1 TABLET (25 MG TOTAL) BY MOUTH DAILY. 30 tablet 11  . hydrochlorothiazide (HYDRODIURIL) 25 MG tablet TAKE 1 TABLET (25 MG TOTAL) BY MOUTH AT BEDTIME. 30 tablet 11   No current facility-administered medications on file prior to visit.     Allergies  Allergen Reactions  . Bactrim [Sulfamethoxazole-Trimethoprim]     itching  . Simvastatin Other (See Comments)    Leg pain    Past Medical History:  Diagnosis Date  . Abdominal pain, right lower quadrant   . Acquired cyst of kidney 01/08/2012   pt denies history of   . Acute sinusitis, unspecified   . Adenomatous colon polyp   . Angiomyolipoma    kidney  . Chest pain, unspecified   . Depressive disorder, not elsewhere classified 01/08/2012   not currently   . Diverticulosis   . Dizziness and giddiness   . Dysfunction of eustachian tube   . Hemorrhoids   . History of diverticulitis of colon   . IBS (irritable bowel syndrome)   . Liver lesion 2011  . Migraine, unspecified, without mention of intractable migraine without mention of status migrainosus   . Neoplasm of skin of face    malignant  . OA (osteoarthritis)    knee  . Obesity, unspecified   . Ostium secundum type atrial septal defect   . Other and unspecified hyperlipidemia   . Other malaise and fatigue   . Palpitations   . Shortness of breath   . Unspecified essential hypertension   . Unspecified sinusitis (chronic)   . Unspecified vitamin D deficiency     Past Surgical History:  Procedure Laterality Date  . ABDOMINAL HYSTERECTOMY    .  BREAST REDUCTION SURGERY  1990  . CHOLECYSTECTOMY    . DILATION AND CURETTAGE OF UTERUS    . FLEXIBLE SIGMOIDOSCOPY  01/08/2012   Procedure: FLEXIBLE SIGMOIDOSCOPY;  Surgeon: Lafayette Dragon, MD;  Location: [facility];  Service: Endoscopy;  Laterality: N/A;  . KNEE ARTHROTOMY     right  . LEFT OOPHORECTOMY  1976  . PARTIAL HYSTERECTOMY     right ovary remains  . TONSILLECTOMY  1972    Family History  Problem Relation Age of Onset  . Breast cancer Cousin   . Cirrhosis Father   . Hypertension Mother   . Hypertension Sister   . Diabetes Mother   . Diabetes Cousin   . Diabetes      aunt  . Stroke      aunt  . Heart disease Father   . Heart disease Brother     x 2  . Colon cancer Neg Hx     Social History   Social History  . Marital status: Married    Spouse name: N/A  . Number of children: 2  . 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 Smokeless tobacco: Never Used  . Alcohol use No  . Drug  use: No  . Sexual activity: Not on file   Other Topics Concern  . Not on file   Social History Narrative   Daily caffeine    The PMH, PSH, Social History, Family History, Medications, and allergies have been reviewed in [facility], and have been updated if relevant.  OBJECTIVE: BP (!) 126/58   Pulse 61   Temp 97.7 F (36.5 C) (Oral)   Wt 184 lb 8 oz (83.7 kg)   SpO2 95%   BMI 30.70 kg/m   She appears well, vital signs are as noted. Ears normal.  Throat and pharynx normal.  Neck supple. No adenopathy in the neck. Nose is congested. Sinuses  tender. The chest is clear, without wheezes or rales.  ASSESSMENT:  sinusitis  PLAN: Given duration and progression of symptoms, will treat for bacterial sinusitis.  Symptomatic therapy suggested: push fluids, rest and return office visit prn if symptoms persist or worsen.. Call or return to clinic prn if these symptoms worsen or  fail to improve as anticipated.

## 2016-09-11 ENCOUNTER — Other Ambulatory Visit: Payer: Self-pay | Admitting: Family Medicine

## 2016-09-12 ENCOUNTER — Telehealth: Payer: Self-pay

## 2016-09-12 DIAGNOSIS — M25579 Pain in unspecified ankle and joints of unspecified foot: Secondary | ICD-10-CM

## 2016-09-12 NOTE — Telephone Encounter (Signed)
Im sorry to hear that.  Referral placed.

## 2016-09-12 NOTE — Telephone Encounter (Signed)
Pt left v/m; pt was seen 09/05/16 and pt had place on bottom of foot checked; pt was advised what to do and pt has followed instructions; now the pain has worsened and almost unbearable. Now pt having problem walking on foot due to pain worsening. Pt request referral. Please advise.

## 2016-09-14 ENCOUNTER — Telehealth: Payer: Self-pay | Admitting: *Deleted

## 2016-09-14 ENCOUNTER — Other Ambulatory Visit: Payer: Self-pay | Admitting: Internal Medicine

## 2016-09-14 ENCOUNTER — Ambulatory Visit (INDEPENDENT_AMBULATORY_CARE_PROVIDER_SITE_OTHER): Payer: Medicare Other | Admitting: Podiatry

## 2016-09-14 ENCOUNTER — Encounter: Payer: Self-pay | Admitting: Podiatry

## 2016-09-14 VITALS — BP 131/75 | HR 70 | Resp 14

## 2016-09-14 DIAGNOSIS — Q828 Other specified congenital malformations of skin: Secondary | ICD-10-CM | POA: Diagnosis not present

## 2016-09-14 DIAGNOSIS — T300 Burn of unspecified body region, unspecified degree: Secondary | ICD-10-CM | POA: Diagnosis not present

## 2016-09-14 MED ORDER — AZITHROMYCIN 250 MG PO TABS
ORAL_TABLET | ORAL | 0 refills | Status: DC
Start: 2016-09-14 — End: 2016-10-02

## 2016-09-14 NOTE — Telephone Encounter (Signed)
Pt left voicemail at triage. Pt said that the amoxicillin that Dr. Deborra Medina prescribed her for her sinus inf. hasn't helped at all, pt said her sxs are the exact same as when Dr. Deborra Medina checked her out and she takes her last abx pill today. Pt said that she needs a "stronger" abx sent in to pharmacy like a zpak or Augmentin sent in to her pharmacy on file

## 2016-09-14 NOTE — Telephone Encounter (Signed)
Spoke to pt and advised 
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
## 2016-09-14 NOTE — Telephone Encounter (Signed)
Zpack sent to pharmacy on file, she should follow up after 2nd abx if no improvement

## 2016-10-02 ENCOUNTER — Ambulatory Visit (INDEPENDENT_AMBULATORY_CARE_PROVIDER_SITE_OTHER): Payer: Medicare Other | Admitting: Family Medicine

## 2016-10-02 ENCOUNTER — Encounter: Payer: Self-pay | Admitting: Family Medicine

## 2016-10-02 VITALS — BP 128/68 | HR 64 | Temp 98.4°F | Wt 185.0 lb

## 2016-10-02 DIAGNOSIS — I10 Essential (primary) hypertension: Secondary | ICD-10-CM

## 2016-10-02 DIAGNOSIS — R42 Dizziness and giddiness: Secondary | ICD-10-CM | POA: Diagnosis not present

## 2016-10-02 DIAGNOSIS — H9319 Tinnitus, unspecified ear: Secondary | ICD-10-CM | POA: Insufficient documentation

## 2016-10-02 MED ORDER — PREDNISONE 20 MG PO TABS: 40.0000 mg | ORAL_TABLET | Freq: Every day | ORAL | 0 refills | Status: DC

## 2016-10-02 NOTE — Assessment & Plan Note (Signed)
Discussed with pt->25 minutes spent in face to face time with patient, >50% spent in counselling or coordination of care discussing dizziness, sinusitis, and tinnitus.    I do not think this is related to a sinus infection/bacterial process at this point. Likely multifactorial.  She is still quite congested- will place on course of prednisone which will also hopefully help with tinnitus. Also d/c beta blocker- ? orthostatis and she does not have heart disease. The patient indicates understanding of these issues and agrees with the plan.

## 2016-10-02 NOTE — Patient Instructions (Addendum)
Great to see you. Please keep me updated.  Take prednisone as directed- 40 mg daily with breakfast x 7 days.  Ok to stop see you. Ok to stop the atenolol( ok to break it in half and take every other day for a week and stop) and come see me 2 weeks after you've taken the last dose.

## 2016-10-02 NOTE — Progress Notes (Signed)
Subjective:   Patient ID: Nicole Daniels, female    DOB: Sep 27, 1951, 65 y.o.   MRN: [id number]  Nicole Daniels is a pleasant 65 y.o. year old female who presents to clinic today with Nasal Congestion  on 10/02/2016  HPI: Dizziness with nasal congestion- Saw her on 09/05/16 ?sinusitis- note reviewed.   Given Augmentin which "typically works for my sinus infections."  She called back on 09/14/16 because symptoms were not better.  Placed on Zpack by Webb Silversmith, NP.  Sinus pressure improved but still has dizziness and tinnitus.  Dizziness when she stands from a seated position.  Has been to ENT in past.  Current Outpatient Prescriptions on File Prior to Visit  Medication Sig Dispense Refill  . aspirin 325 MG EC tablet 325 mg.    . hydrochlorothiazide (HYDRODIURIL) 25 MG tablet TAKE 1 TABLET (25 MG TOTAL) BY MOUTH AT BEDTIME. 30 tablet 0   No current facility-administered medications on file prior to visit.     Allergies  Allergen Reactions  . Bactrim [Sulfamethoxazole-Trimethoprim]     itching  . Simvastatin Other (See Comments)    Leg pain    Past Medical History:  Diagnosis Date  . Abdominal pain, right lower quadrant   . Acquired cyst of kidney 01/08/2012   pt denies history of   . Acute sinusitis, unspecified   . Adenomatous colon polyp   . Angiomyolipoma    kidney  . Chest pain, unspecified   . Depressive disorder, not elsewhere classified 01/08/2012   not currently   . Diverticulosis   . Dizziness and giddiness   . Dysfunction of eustachian tube   . Hemorrhoids   . History of diverticulitis of colon   . IBS (irritable bowel syndrome)   . Liver lesion 2011  . Migraine, unspecified, without mention of intractable migraine without mention of status migrainosus   . Neoplasm of skin of face    malignant  . OA (osteoarthritis)    knee  . Obesity, unspecified   . Ostium secundum type atrial septal defect   . Other and unspecified hyperlipidemia     . Other malaise and fatigue   . Palpitations   . Shortness of breath   . Unspecified essential hypertension   . Unspecified sinusitis (chronic)   . Unspecified vitamin D deficiency     Past Surgical History:  Procedure Laterality Date  . ABDOMINAL HYSTERECTOMY    . BREAST REDUCTION SURGERY  1990  . CHOLECYSTECTOMY    . DILATION AND CURETTAGE OF UTERUS    . FLEXIBLE SIGMOIDOSCOPY  01/08/2012   Procedure: FLEXIBLE SIGMOIDOSCOPY;  Surgeon: Lafayette Dragon, MD;  Location: [facility];  Service: Endoscopy;  Laterality: N/A;  . KNEE ARTHROTOMY     right  . LEFT OOPHORECTOMY  1976  . PARTIAL HYSTERECTOMY     right ovary remains  . TONSILLECTOMY  1972    Family History  Problem Relation Age of Onset  . Cirrhosis Father   . Heart disease Father   . Hypertension Mother   . Diabetes Mother   . Breast cancer Cousin   . Hypertension Sister   . Diabetes Cousin   . Diabetes      aunt  . Stroke      aunt  . Heart disease Brother     x 2  . Colon cancer Neg Hx     Social History   Social History  . Marital status: Married    Spouse name: N/A  .  Number of children: 2  . Years of education: N/A   Occupational History  . 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 Smokeless tobacco: Never Used  . Alcohol use No  . Drug use: No  . Sexual activity: Not on file   Other Topics Concern  . Not on file   Social History Narrative   Daily caffeine    The PMH, PSH, Social History, Family History, Medications, and allergies have been reviewed in [facility], and have been updated if relevant.     Review of Systems  Constitutional: Negative.   HENT: Positive for congestion and tinnitus. Negative for dental problem, drooling, ear discharge, facial swelling, hearing loss, mouth sores, nosebleeds, postnasal drip, rhinorrhea, sinus pain and sinus pressure.   Eyes: Negative.   Respiratory: Negative.   Cardiovascular: Negative.    Neurological: Positive for dizziness and light-headedness. Negative for tremors, seizures, syncope, facial asymmetry, speech difficulty, weakness, numbness and headaches.  Hematological: Negative.   Psychiatric/Behavioral: Negative.   All other systems reviewed and are negative.      Objective:    BP 128/68   Pulse 64   Temp 98.4 F (36.9 C) (Oral)   Wt 185 lb (83.9 kg)   SpO2 97%   BMI 30.79 kg/m    Physical Exam  Constitutional: She is oriented to person, place, and time. She appears well-developed and well-nourished. No distress.  HENT:  Head: Normocephalic and atraumatic.  Right Ear: Hearing and tympanic membrane normal.  Left Ear: Hearing and tympanic membrane normal.  Nose: Mucosal edema present. No sinus tenderness. Right sinus exhibits no maxillary sinus tenderness and no frontal sinus tenderness. Left sinus exhibits no maxillary sinus tenderness and no frontal sinus tenderness.  Eyes: Conjunctivae are normal.  Cardiovascular: Normal rate.   Pulmonary/Chest: Effort normal.  Musculoskeletal: Normal range of motion. She exhibits no edema or tenderness.  Neurological: She is alert and oriented to person, place, and time. No cranial nerve deficit.  Skin: Skin is warm and dry. She is not diaphoretic.  Psychiatric: She has a normal mood and affect. Her behavior is normal. Judgment and thought content normal.  Nursing note and vitals reviewed.         Assessment & Plan:   Essential hypertension  Tinnitus, unspecified laterality  Dizziness and giddiness No Follow-up on file.

## 2016-10-02 NOTE — Progress Notes (Signed)
Pre visit review using our clinic review tool, if applicable. No additional management support is needed unless otherwise documented below in the visit note. 

## 2016-10-10 ENCOUNTER — Other Ambulatory Visit: Payer: Self-pay | Admitting: Family Medicine

## 2016-10-12 ENCOUNTER — Telehealth: Payer: Self-pay

## 2016-10-12 NOTE — Telephone Encounter (Signed)
Pt left v/m; pt was seen 10/02/16; pt has finished prednisone and still lightheaded. Pt request cb. [facility]. Pt does not have f/u scheduled.

## 2016-10-12 NOTE — Telephone Encounter (Signed)
Lm on pts vm requesting a call back 

## 2016-10-12 NOTE — Telephone Encounter (Signed)
If there is no improvement, I do recommend that she go back to ENT for further work up and management.  Does she need [place] to refer her to a different ENT?

## 2016-10-13 NOTE — Telephone Encounter (Signed)
Lm on pts vm requesting a call back 

## 2016-10-16 NOTE — Telephone Encounter (Signed)
Spoke to pt who states she has made some improvement and will "decide" if she is wanting to schedule with ENT

## 2016-11-04 ENCOUNTER — Other Ambulatory Visit: Payer: Self-pay | Admitting: Family Medicine

## 2016-11-17 ENCOUNTER — Ambulatory Visit (INDEPENDENT_AMBULATORY_CARE_PROVIDER_SITE_OTHER): Payer: Medicare Other

## 2016-11-17 ENCOUNTER — Encounter (INDEPENDENT_AMBULATORY_CARE_PROVIDER_SITE_OTHER): Payer: Self-pay | Admitting: Orthopaedic Surgery

## 2016-11-17 ENCOUNTER — Ambulatory Visit (INDEPENDENT_AMBULATORY_CARE_PROVIDER_SITE_OTHER): Payer: Medicare Other | Admitting: Orthopaedic Surgery

## 2016-11-17 DIAGNOSIS — M25512 Pain in left shoulder: Secondary | ICD-10-CM | POA: Diagnosis not present

## 2016-11-17 DIAGNOSIS — G8929 Other chronic pain: Secondary | ICD-10-CM

## 2016-11-17 DIAGNOSIS — M5412 Radiculopathy, cervical region: Secondary | ICD-10-CM | POA: Diagnosis not present

## 2016-11-17 MED ORDER — METHOCARBAMOL 500 MG PO TABS: 500.0000 mg | ORAL_TABLET | Freq: Four times a day (QID) | ORAL | 2 refills | Status: DC | PRN

## 2016-11-17 MED ORDER — DICLOFENAC SODIUM 75 MG PO TBEC: 75.0000 mg | DELAYED_RELEASE_TABLET | Freq: Two times a day (BID) | ORAL | 2 refills | Status: DC

## 2016-11-17 MED ORDER — PREDNISONE 10 MG (21) PO TBPK: ORAL_TABLET | ORAL | 0 refills | Status: DC

## 2016-11-17 NOTE — Progress Notes (Signed)
Office Visit Note   Patient: Nicole Daniels           Date of Birth: [birth year]           MRN: [id number] Visit Date: 11/17/2016              Requested by: Lucille Passy, MD [place], [place] [address] PCP: Arnette Norris, MD   Assessment & Plan: Visit Diagnoses:  1. Chronic left shoulder pain   2. Cervical radiculopathy     Plan: cervical radiculopathy; sterapred, diclofenac, robaxin.  If not better, should return for c spine xrays and possibly MRI  Follow-Up Instructions: Return if symptoms worsen or fail to improve.   Orders:  Orders Placed This Encounter  Procedures  . XR Shoulder Left   Meds ordered this encounter  Medications  . predniSONE (STERAPRED UNI-PAK 21 TAB) 10 MG (21) TBPK tablet    Sig: Take as directed    Dispense:  21 tablet    Refill:  0  . diclofenac (VOLTAREN) 75 MG EC tablet    Sig: Take 1 tablet (75 mg total) by mouth 2 (two) times daily.    Dispense:  30 tablet    Refill:  2  . methocarbamol (ROBAXIN) 500 MG tablet    Sig: Take 1 tablet (500 mg total) by mouth every 6 (six) hours as needed for muscle spasms.    Dispense:  30 tablet    Refill:  2      Procedures: No procedures performed   Clinical Data: No additional findings.   Subjective: Chief Complaint  Patient presents with  . Left Shoulder - Pain    66 yo female with left shoulder and LUE pain for 3 months that's been radiating down to fingers recently.  Pain is 10/10 that is getting worse and worse with sleeping and raising arm.  Aleve helps some.  Denies injuries.  Denies numbness.      Review of Systems Complete ROS negative except HPI  Objective: Vital Signs: There were no vitals taken for this visit.  Physical Exam  Constitutional: She is oriented to person, place, and time. She appears well-developed and well-nourished.  Pulmonary/Chest: Effort normal.  Neurological: She is alert and oriented to person, place, and time.  Skin: Skin is warm.  Capillary refill takes less than 2 seconds.  Psychiatric: She has a normal mood and affect. Her behavior is normal. Judgment and thought content normal.  Nursing note and vitals reviewed.   Ortho Exam +spurling - homan's No focal motor/sensory deficitis - mild discomfort with shoulder ROM  Specialty Comments:  No specialty comments available.  Imaging: No results found.   PMFS History: Patient Active Problem List   Diagnosis Date Noted  . Tinnitus 10/02/2016  . Dizziness and giddiness 10/02/2016  . Allergic reaction 11/12/2013  . Essential hypertension 12/06/2010  . MIGRAINE, CHRONIC 11/24/2010  . UNSPECIFIED VITAMIN D DEFICIENCY 04/08/2010  . DEPRESSION, MILD 03/09/2010  . RENAL CYST 12/22/2009  . OBESITY 08/11/2008  . NEOPLASM, MALIGNANT, SKIN, FACE 12/01/2007  . PATENT FORAMEN OVALE 01/28/2005  . HLD (hyperlipidemia) 03/30/2001   Past Medical History:  Diagnosis Date  . Abdominal pain, right lower quadrant   . Acquired cyst of kidney 01/08/2012   pt denies history of   . Acute sinusitis, unspecified   . Adenomatous colon polyp   . Angiomyolipoma    kidney  . Chest pain, unspecified   . Depressive disorder, not elsewhere classified 01/08/2012  not currently   . Diverticulosis   . Dizziness and giddiness   . Dysfunction of eustachian tube   . Hemorrhoids   . History of diverticulitis of colon   . IBS (irritable bowel syndrome)   . Liver lesion 2011  . Migraine, unspecified, without mention of intractable migraine without mention of status migrainosus   . Neoplasm of skin of face    malignant  . OA (osteoarthritis)    knee  . Obesity, unspecified   . Ostium secundum type atrial septal defect   . Other and unspecified hyperlipidemia   . Other malaise and fatigue   . Palpitations   . Shortness of breath   . Unspecified essential hypertension   . Unspecified sinusitis (chronic)   . Unspecified vitamin D deficiency     Family History  Problem Relation  Age of Onset  . Cirrhosis Father   . Heart disease Father   . Hypertension Mother   . Diabetes Mother   . Breast cancer Cousin   . Hypertension Sister   . Diabetes Cousin   . Diabetes      aunt  . Stroke      aunt  . Heart disease Brother     x 2  . Colon cancer Neg Hx     Past Surgical History:  Procedure Laterality Date  . ABDOMINAL HYSTERECTOMY    . BREAST REDUCTION SURGERY  1990  . CHOLECYSTECTOMY    . DILATION AND CURETTAGE OF UTERUS    . FLEXIBLE SIGMOIDOSCOPY  01/08/2012   Procedure: FLEXIBLE SIGMOIDOSCOPY;  Surgeon: Lafayette Dragon, MD;  Location: [facility];  Service: Endoscopy;  Laterality: N/A;  . KNEE ARTHROTOMY     right  . LEFT OOPHORECTOMY  1976  . PARTIAL HYSTERECTOMY     right ovary remains  . TONSILLECTOMY  1972   Social History   Occupational History  . 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 Smokeless tobacco: Never Used  . Alcohol use No  . Drug use: No  . Sexual activity: Not on file

## 2016-11-20 ENCOUNTER — Ambulatory Visit (INDEPENDENT_AMBULATORY_CARE_PROVIDER_SITE_OTHER): Payer: Medicare Other | Admitting: Orthopaedic Surgery

## 2016-11-30 ENCOUNTER — Telehealth (INDEPENDENT_AMBULATORY_CARE_PROVIDER_SITE_OTHER): Payer: Self-pay | Admitting: Orthopaedic Surgery

## 2016-11-30 DIAGNOSIS — M5412 Radiculopathy, cervical region: Secondary | ICD-10-CM

## 2016-11-30 NOTE — Telephone Encounter (Signed)
PATIENT CALLED NEEDING REFILL ON METHOCARBAMOL AND DICLOFENAC. STILL IN PAIN AND WONDERING IF SHE NEEDS TO BE SEEN OR IF SHE SHOULD JUST WAIT IT OUT TO SEE IF IT GETS BETTER. CB # (HOME) [phone] (CELL) [phone]

## 2016-11-30 NOTE — Telephone Encounter (Signed)
Let's order MRI of c spine f/o HNP

## 2016-11-30 NOTE — Telephone Encounter (Signed)
Please advise on message below.

## 2016-12-01 ENCOUNTER — Telehealth (INDEPENDENT_AMBULATORY_CARE_PROVIDER_SITE_OTHER): Payer: Self-pay | Admitting: Orthopaedic Surgery

## 2016-12-01 MED ORDER — DICLOFENAC SODIUM 75 MG PO TBEC
75.0000 mg | DELAYED_RELEASE_TABLET | Freq: Two times a day (BID) | ORAL | 2 refills | Status: DC
Start: 2016-12-01 — End: 2017-01-11

## 2016-12-01 MED ORDER — METHOCARBAMOL 500 MG PO TABS: 500.0000 mg | ORAL_TABLET | Freq: Four times a day (QID) | ORAL | 2 refills | Status: DC | PRN

## 2016-12-01 NOTE — Telephone Encounter (Signed)
That's fine.  approve

## 2016-12-01 NOTE — Telephone Encounter (Signed)
See message below °

## 2016-12-01 NOTE — Telephone Encounter (Signed)
MRI order made.   

## 2016-12-01 NOTE — Telephone Encounter (Signed)
approve

## 2016-12-01 NOTE — Telephone Encounter (Signed)
Patient called again this morning wondering about her medication refill that she called about yesterday.  She states that she is still in pain although not as bad as when she first came in.  She also stated that she is out of her pain medication and is needing something for the weekend.  She requested refill on her methocarbamol and her diclofenac.  [id number].  Thank you

## 2016-12-01 NOTE — Telephone Encounter (Signed)
RX SENT TO PHARM PT AWARE, MRI ORDER MADE

## 2016-12-01 NOTE — Telephone Encounter (Signed)
What about the Rx refills? See message below. Thanks.

## 2016-12-01 NOTE — Telephone Encounter (Signed)
Both Rx Sent into her pharm. Called patient. Patient aware.  Also MRI order was made. They will call her to schedule appt

## 2016-12-01 NOTE — Addendum Note (Signed)
Addended by: Precious Bard on: 12/01/2016 09:30 AM   Modules accepted: Orders

## 2016-12-10 ENCOUNTER — Ambulatory Visit
Admission: RE | Admit: 2016-12-10 | Discharge: 2016-12-10 | Disposition: A | Discharge status: Home / Self Care | Payer: Medicare Other | Source: Ambulatory Visit | Attending: Orthopaedic Surgery | Admitting: Orthopaedic Surgery

## 2016-12-10 DIAGNOSIS — M5412 Radiculopathy, cervical region: Secondary | ICD-10-CM

## 2016-12-11 ENCOUNTER — Telehealth (INDEPENDENT_AMBULATORY_CARE_PROVIDER_SITE_OTHER): Payer: Self-pay | Admitting: Orthopaedic Surgery

## 2016-12-11 NOTE — Telephone Encounter (Signed)
Patient called asking wondering what she can do about her back pain. Had a MRI the other day and was only able to get a few shots, she had to leave early because she was in so much pain. CB # [phone]

## 2016-12-12 ENCOUNTER — Ambulatory Visit (INDEPENDENT_AMBULATORY_CARE_PROVIDER_SITE_OTHER): Payer: Medicare Other | Admitting: Orthopaedic Surgery

## 2016-12-12 DIAGNOSIS — M5412 Radiculopathy, cervical region: Secondary | ICD-10-CM

## 2016-12-12 MED ORDER — TIZANIDINE HCL 4 MG PO TABS: 4.0000 mg | ORAL_TABLET | Freq: Four times a day (QID) | ORAL | 3 refills | Status: DC | PRN

## 2016-12-12 MED ORDER — HYDROCODONE-ACETAMINOPHEN 5-325 MG PO TABS: 1.0000 | ORAL_TABLET | Freq: Four times a day (QID) | ORAL | 0 refills | Status: DC | PRN

## 2016-12-12 NOTE — Addendum Note (Signed)
Addended by: Precious Bard on: 12/12/2016 08:45 AM   Modules accepted: Orders

## 2016-12-12 NOTE — Progress Notes (Signed)
Office Visit Note   Patient: Nicole Daniels           Date of Birth: [birth year]           MRN: [id number] Visit Date: 12/12/2016              Requested by: Lucille Passy, MD [place], [place] [address] PCP: Arnette Norris, MD   Assessment & Plan: Visit Diagnoses:  1. Cervical radiculopathy     Plan: refer to Dr. Ernestina Patches for c spine ESI.  Based on MRI, which I independently reviewed and interpreted, she has DDD at C5-6,C6-7.  Also NCV to r/o left CTS.  Follow-Up Instructions: Return if symptoms worsen or fail to improve.   Orders:  No orders of the defined types were placed in this encounter.  Meds ordered this encounter  Medications  . HYDROcodone-acetaminophen (NORCO) 5-325 MG tablet    Sig: Take 1 tablet by mouth every 6 (six) hours as needed.    Dispense:  20 tablet    Refill:  0  . tiZANidine (ZANAFLEX) 4 MG tablet    Sig: Take 1 tablet (4 mg total) by mouth every 6 (six) hours as needed for muscle spasms.    Dispense:  30 tablet    Refill:  3      Procedures: No procedures performed   Clinical Data: No additional findings.   Subjective: Chief Complaint  Patient presents with  . Left Shoulder - Pain, Follow-up  . Neck - Pain, Follow-up    Patient f/u today for cervical radiculitis.  She was only able to tolerated 2 sagittal sequences of her MRI before aborting.  She continues to have severe pain radiating down her left arm.  No new numbness or weakness.    Review of Systems  Constitutional: Negative.   HENT: Negative.   Eyes: Negative.   Respiratory: Negative.   Cardiovascular: Negative.   Endocrine: Negative.   Musculoskeletal: Negative.   Neurological: Negative.   Hematological: Negative.   Psychiatric/Behavioral: Negative.   All other systems reviewed and are negative.    Objective: Vital Signs: There were no vitals taken for this visit.  Physical Exam  Constitutional: She is oriented to person, place, and time. She appears  well-developed and well-nourished.  Pulmonary/Chest: Effort normal.  Neurological: She is alert and oriented to person, place, and time.  Skin: Skin is warm. Capillary refill takes less than 2 seconds.  Psychiatric: She has a normal mood and affect. Her behavior is normal. Judgment and thought content normal.  Nursing note and vitals reviewed.   Ortho Exam - negative Durkan's, tinel, phalen Specialty Comments:  No specialty comments available.  Imaging: No results found.   PMFS History: Patient Active Problem List   Diagnosis Date Noted  . Tinnitus 10/02/2016  . Dizziness and giddiness 10/02/2016  . Allergic reaction 11/12/2013  . Essential hypertension 12/06/2010  . MIGRAINE, CHRONIC 11/24/2010  . UNSPECIFIED VITAMIN D DEFICIENCY 04/08/2010  . DEPRESSION, MILD 03/09/2010  . RENAL CYST 12/22/2009  . OBESITY 08/11/2008  . NEOPLASM, MALIGNANT, SKIN, FACE 12/01/2007  . PATENT FORAMEN OVALE 01/28/2005  . HLD (hyperlipidemia) 03/30/2001   Past Medical History:  Diagnosis Date  . Abdominal pain, right lower quadrant   . Acquired cyst of kidney 01/08/2012   pt denies history of   . Acute sinusitis, unspecified   . Adenomatous colon polyp   . Angiomyolipoma    kidney  . Chest pain, unspecified   . Depressive disorder,  not elsewhere classified 01/08/2012   not currently   . Diverticulosis   . Dizziness and giddiness   . Dysfunction of eustachian tube   . Hemorrhoids   . History of diverticulitis of colon   . IBS (irritable bowel syndrome)   . Liver lesion 2011  . Migraine, unspecified, without mention of intractable migraine without mention of status migrainosus   . Neoplasm of skin of face    malignant  . OA (osteoarthritis)    knee  . Obesity, unspecified   . Ostium secundum type atrial septal defect   . Other and unspecified hyperlipidemia   . Other malaise and fatigue   . Palpitations   . Shortness of breath   . Unspecified essential hypertension   .  Unspecified sinusitis (chronic)   . Unspecified vitamin D deficiency     Family History  Problem Relation Age of Onset  . Cirrhosis Father   . Heart disease Father   . Hypertension Mother   . Diabetes Mother   . Breast cancer Cousin   . Hypertension Sister   . Diabetes Cousin   . Diabetes      aunt  . Stroke      aunt  . Heart disease Brother     x 2  . Colon cancer Neg Hx     Past Surgical History:  Procedure Laterality Date  . ABDOMINAL HYSTERECTOMY    . BREAST REDUCTION SURGERY  1990  . CHOLECYSTECTOMY    . DILATION AND CURETTAGE OF UTERUS    . FLEXIBLE SIGMOIDOSCOPY  01/08/2012   Procedure: FLEXIBLE SIGMOIDOSCOPY;  Surgeon: Lafayette Dragon, MD;  Location: [facility];  Service: Endoscopy;  Laterality: N/A;  . KNEE ARTHROTOMY     right  . LEFT OOPHORECTOMY  1976  . PARTIAL HYSTERECTOMY     right ovary remains  . TONSILLECTOMY  1972   Social History   Occupational History  . 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 Smokeless tobacco: Never Used  . Alcohol use No  . Drug use: No  . Sexual activity: Not on file

## 2016-12-12 NOTE — Telephone Encounter (Signed)
Patient was seen in our office today.  

## 2016-12-14 ENCOUNTER — Other Ambulatory Visit: Payer: Medicare Other

## 2016-12-21 ENCOUNTER — Ambulatory Visit (INDEPENDENT_AMBULATORY_CARE_PROVIDER_SITE_OTHER): Payer: Medicare Other | Admitting: Physical Medicine and Rehabilitation

## 2016-12-21 ENCOUNTER — Encounter (INDEPENDENT_AMBULATORY_CARE_PROVIDER_SITE_OTHER): Payer: Self-pay | Admitting: Physical Medicine and Rehabilitation

## 2016-12-21 VITALS — BP 144/82 | HR 73

## 2016-12-21 DIAGNOSIS — M542 Cervicalgia: Secondary | ICD-10-CM | POA: Diagnosis not present

## 2016-12-21 DIAGNOSIS — R200 Anesthesia of skin: Secondary | ICD-10-CM | POA: Diagnosis not present

## 2016-12-21 DIAGNOSIS — M5412 Radiculopathy, cervical region: Secondary | ICD-10-CM

## 2016-12-21 DIAGNOSIS — R202 Paresthesia of skin: Secondary | ICD-10-CM

## 2016-12-21 MED ORDER — HYDROCODONE-ACETAMINOPHEN 5-325 MG PO TABS: 1.0000 | ORAL_TABLET | Freq: Four times a day (QID) | ORAL | 0 refills | Status: DC | PRN

## 2016-12-21 MED ORDER — GABAPENTIN 300 MG PO CAPS: ORAL_CAPSULE | ORAL | 1 refills | Status: DC

## 2016-12-21 NOTE — Progress Notes (Signed)
NADALEE HAYDEL - 66 y.o. female MRN [id number]  Date of birth: [birth year]  Office Visit Note: Visit Date: 12/21/2016 PCP: Arnette Norris, MD Referred by: Lucille Passy, MD  Subjective: Chief Complaint  Patient presents with  . Left Arm - Numbness, Pain  . Neck - Pain   HPI: Mrs. Wehrle is a very pleasant 66 year old right-handed female with about 6 weeks of severe left neck and shoulder blade shoulder and arm pain radiating into the third and fourth fingers on the left hand. She reports that although she is right-hand dominant she does do a lot of things left handed. She's been getting a lot of this pain and numbness and tingling which is very bothersome. She has been taking anti-inflammatories as well as muscle relaxers without relief. Dr. Wyline Copas did give her a course of prednisone as well and this didn't seem to help very much. She has taken some hydrocodone release take the edge off. She reports some shooting pain raising the arm but otherwise cannot really tell you there is any specific movement pattern that worsens or improves the pain. She denies any right-sided complaints. She does have a history of migraines but is not associated a headache with this. She's not had any prior episodes of this. No prior electrodiagnostic studies. An MRI of the cervical spine was ordered but she was unable to complete the MRI. She states that she has a CD showing a couple of sequences and evidently Dr. Erlinda Hong did see these. I'm unable on the computer to pull those up. She will bring the CD to [place]. The reason she could not complete the MRI was due to the pain of laying there on the posterior shoulder blade. She does suffer from depression. She has lost her husband to lung cancer and her son to brain cancer over the last year. She hasn't noticed a great deal of weakness but has felt some weakness in the hand.     Review of Systems  Constitutional: Negative for chills, fever, malaise/fatigue and weight loss.    HENT: Negative for hearing loss and sinus pain.   Eyes: Negative for blurred vision, double vision and photophobia.  Respiratory: Negative for cough and shortness of breath.   Cardiovascular: Negative for chest pain, palpitations and leg swelling.  Gastrointestinal: Negative for abdominal pain, nausea and vomiting.  Genitourinary: Negative for flank pain.  Musculoskeletal: Positive for neck pain. Negative for myalgias.  Skin: Negative for itching and rash.  Neurological: Positive for tingling and sensory change. Negative for tremors, focal weakness and weakness.  Endo/Heme/Allergies: Negative.   Psychiatric/Behavioral: Negative for depression.  All other systems reviewed and are negative.  Otherwise per HPI.  Assessment & Plan: Visit Diagnoses:  1. Cervical radiculopathy   2. Cervicalgia   3. Numbness and tingling     Plan: No additional findings.  Impression: The above electrodiagnostic study is ABNORMAL and reveals evidence of severe acute C7 radiculopathy on the left.  There is no electrodiagnostic evidence of any other focal nerve entrapment, brachial plexopathy or generalized peripheral neuropathy.   Recommendations: 1.  Follow-up with referring physician. 2.  Continue current management of symptoms. 3.  Recommend C7-T1 intlerlaminar epidural steroid injection diagnostically and therapeutically and referral to spine surgeon for evaluation.  When we did talk about spine surgery referral the patient has had some experience with Dr. Sherwood Gambler at [facility] to her husband and son in the past year. She would like to be referred to him and  we did make that referral. She is also going to bring the CD with the images from the MRI. Depending on evaluation and management could look at a CT scan which could be done quicker but wouldn't tell us quite as much. CT myelogram could also be looked at as that of the MRI but I think that would be just as painful from a  standpoint getting it done. For now diagnostic and therapeutic epidural injection is warranted. I also started her on gabapentin at night.   Meds & Orders:  Meds ordered this encounter  Medications  . HYDROcodone-acetaminophen (NORCO) 5-325 MG tablet    Sig: Take 1 tablet by mouth every 6 (six) hours as needed.    Dispense:  40 tablet    Refill:  0  . gabapentin (NEURONTIN) 300 MG capsule    Sig: 1 by mouth at night for 7 days then 1 by mouth twice per day for 7 days then 1 by mouth three times per day.    Dispense:  180 capsule    Refill:  1    Orders Placed This Encounter  Procedures  . Ambulatory referral to Neurosurgery  . NCV with EMG (electromyography)    Follow-up: Return for C7-T1 intralaminar epidural steroid injection..   Procedures: No procedures performed  NCV & EMG Findings: All nerve conduction studies (as indicated in the following tables) were within normal limits.    Needle evaluation of the left first dorsal interosseous muscle showed increased insertional activity.  The left extensor digitorum muscle showed moderately increased spontaneous activity and diminished recruitment.  The left triceps muscle showed slightly increased spontaneous activity and diminished recruitment.  The left pronator teres muscle showed increased insertional activity, increased spontaneous activity, and diminished recruitment.  All remaining muscles (as indicated in the following table) showed no evidence of electrical instability.    Impression: The above electrodiagnostic study is ABNORMAL and reveals evidence of severe acute C7 radiculopathy on the left.  There is no electrodiagnostic evidence of any other focal nerve entrapment, brachial plexopathy or generalized peripheral neuropathy.   Recommendations: 1.  Follow-up with referring physician. 2.  Continue current management of symptoms. 3.  Recommend C7-T1 intlerlaminar epidural steroid injection diagnostically and therapeutically  and referral to spine surgeon for evaluation.   Nerve Conduction Studies Anti Sensory Summary Table   Stim Site NR Peak (ms) Norm Peak (ms) P-T Amp (V) Norm P-T Amp Site1 Site2 Delta-P (ms) Dist (cm) Vel (m/s) Norm Vel (m/s)  Left Median Anti Sensory (2nd Digit)  33.3C  Wrist    3.2 <3.6 30.0 >10 Wrist 2nd Digit 3.2 14.0 44 >39  Elbow    1.5  35.9  Elbow Wrist 1.7 0.0  >48  Left Radial Anti Sensory (Base 1st Digit)  32.8C  Wrist    1.8 <3.1 17.4  Wrist Base 1st Digit 1.8 0.0    Left Ulnar Anti Sensory (5th Digit)  33.1C  Wrist    2.7 <3.7 28.1 >15.0 Wrist 5th Digit 2.7 14.0 52 >38   Motor Summary Table   Stim Site NR Onset (ms) Norm Onset (ms) O-P Amp (mV) Norm O-P Amp Site1 Site2 Delta-0 (ms) Dist (cm) Vel (m/s) Norm Vel (m/s)  Left Median Motor (Abd Poll Brev)  32.7C  Wrist    3.3 <4.2 5.7 >5 Elbow Wrist 3.8 22.5 59 >50  Elbow    7.1  5.5         Left Ulnar Motor (Abd Dig Min)  32.8C  Wrist  2.7 <4.2 8.4 >3 B Elbow Wrist 3.1 19.0 61 >53  B Elbow    5.8  7.1  A Elbow B Elbow 1.5 10.0 67 >53  A Elbow    7.3  6.0          EMG   Side Muscle Nerve Root Ins Act Fibs Psw Amp Dur Poly Recrt Int Fraser Din Comment  Left 1stDorInt Ulnar C8-T1 Incr Nml Nml Nml Nml 0 Nml Nml   Left Abd Poll Brev Median C8-T1 Nml Nml Nml Nml Nml 0 Nml Nml   Left Triceps Radial C6-7-8 Nml 1+ 1+ Nml Nml 0 Reduced Nml   Left Ext Digitorum Radial (Post Int) C7-8 Nml 2+ 2+ Nml Nml 0 Reduced Nml   Left Biceps Musculocut C5-6 Nml Nml Nml Nml Nml 0 Nml Nml   Left Deltoid Axillary C5-6 Nml Nml Nml Nml Nml 0 Nml Nml   Left PronatorTeres Median C6-7 Incr 3+ 3+ Nml Nml 0 Reduced Nml     Nerve Conduction Studies Anti Sensory Left/Right Comparison   Stim Site L Lat (ms) R Lat (ms) L-R Lat (ms) L Amp (V) R Amp (V) L-R Amp (%) Site1 Site2 L Vel (m/s) R Vel (m/s) L-R Vel (m/s)  Median Anti Sensory (2nd Digit)  33.3C  Wrist 3.2   30.0   Wrist 2nd Digit 44    Elbow 1.5   35.9   Elbow Wrist     Radial Anti Sensory  (Base 1st Digit)  32.8C  Wrist 1.8   17.4   Wrist Base 1st Digit     Ulnar Anti Sensory (5th Digit)  33.1C  Wrist 2.7   28.1   Wrist 5th Digit 52     Motor Left/Right Comparison   Stim Site L Lat (ms) R Lat (ms) L-R Lat (ms) L Amp (mV) R Amp (mV) L-R Amp (%) Site1 Site2 L Vel (m/s) R Vel (m/s) L-R Vel (m/s)  Median Motor (Abd Poll Brev)  32.7C  Wrist 3.3   5.7   Elbow Wrist 59    Elbow 7.1   5.5         Ulnar Motor (Abd Dig Min)  32.8C  Wrist 2.7   8.4   B Elbow Wrist 61    B Elbow 5.8   7.1   A Elbow B Elbow 67    A Elbow 7.3   6.0               Clinical History: No specialty comments available.  She reports that she has never smoked. She has never used smokeless tobacco. No results for input(s): HGBA1C, LABURIC in the last 8760 hours.  Objective:  VS:  HT:    WT:   BMI:     BP:(!) 144/82  HR:73bpm  TEMP: ( )  RESP:  Physical Exam  Constitutional: She is oriented to person, place, and time. She appears well-developed and well-nourished.  Eyes: Conjunctivae and EOM are normal. Pupils are equal, round, and reactive to light.  Cardiovascular: Normal rate and intact distal pulses.   Pulmonary/Chest: Effort normal.  Musculoskeletal:  Cervical range of motion is limited with left rotation and extension with essentially a positive Spurling sign. She does have focal trigger points as well in the levator scapula and rhomboid. She has no shoulder impingement signs. She has good strength bilaterally and symmetric except for wrist extension which is a 4 minus out of 5 but still intact. She does have some decreased sensation more consistent with C7  dermatome. She has a negative Tinel sign and negative Phalen's sign. She may have some mild atrophy of the FDI muscle on the left more than right but it's hard to tell because she does have some second metacarpal arthritis.  Neurological: She is alert and oriented to person, place, and time. She exhibits normal muscle tone. Coordination  normal.  Skin: Skin is warm and dry. No rash noted. No erythema.  Psychiatric: She has a normal mood and affect. Her behavior is normal.  Nursing note and vitals reviewed.   Ortho Exam Imaging: No results found.  Past Medical/Family/Surgical/Social History: Medications & Allergies reviewed per EMR Patient Active Problem List   Diagnosis Date Noted  . Tinnitus 10/02/2016  . Dizziness and giddiness 10/02/2016  . Allergic reaction 11/12/2013  . Essential hypertension 12/06/2010  . MIGRAINE, CHRONIC 11/24/2010  . UNSPECIFIED VITAMIN D DEFICIENCY 04/08/2010  . DEPRESSION, MILD 03/09/2010  . RENAL CYST 12/22/2009  . OBESITY 08/11/2008  . NEOPLASM, MALIGNANT, SKIN, FACE 12/01/2007  . PATENT FORAMEN OVALE 01/28/2005  . HLD (hyperlipidemia) 03/30/2001   Past Medical History:  Diagnosis Date  . Abdominal pain, right lower quadrant   . Acquired cyst of kidney 01/08/2012   pt denies history of   . Acute sinusitis, unspecified   . Adenomatous colon polyp   . Angiomyolipoma    kidney  . Chest pain, unspecified   . Depressive disorder, not elsewhere classified 01/08/2012   not currently   . Diverticulosis   . Dizziness and giddiness   . Dysfunction of eustachian tube   . Hemorrhoids   . History of diverticulitis of colon   . IBS (irritable bowel syndrome)   . Liver lesion 2011  . Migraine, unspecified, without mention of intractable migraine without mention of status migrainosus   . Neoplasm of skin of face    malignant  . OA (osteoarthritis)    knee  . Obesity, unspecified   . Ostium secundum type atrial septal defect   . Other and unspecified hyperlipidemia   . Other malaise and fatigue   . Palpitations   . Shortness of breath   . Unspecified essential hypertension   . Unspecified sinusitis (chronic)   . Unspecified vitamin D deficiency    Family History  Problem Relation Age of Onset  . Cirrhosis Father   . Heart disease Father   . Hypertension Mother   . Diabetes  Mother   . Breast cancer Cousin   . Hypertension Sister   . Diabetes Cousin   . Diabetes      aunt  . Stroke      aunt  . Heart disease Brother     x 2  . Colon cancer Neg Hx    Past Surgical History:  Procedure Laterality Date  . ABDOMINAL HYSTERECTOMY    . BREAST REDUCTION SURGERY  1990  . CHOLECYSTECTOMY    . DILATION AND CURETTAGE OF UTERUS    . FLEXIBLE SIGMOIDOSCOPY  01/08/2012   Procedure: FLEXIBLE SIGMOIDOSCOPY;  Surgeon: Lafayette Dragon, MD;  Location: [facility];  Service: Endoscopy;  Laterality: N/A;  . KNEE ARTHROTOMY     right  . LEFT OOPHORECTOMY  1976  . PARTIAL HYSTERECTOMY     right ovary remains  . TONSILLECTOMY  1972   Social History   Occupational History  . Bus Router for [employer] History Main Topics  . 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 Smokeless tobacco: Never Used  . Alcohol use No  .  Drug use: No  . Sexual activity: Not on file

## 2016-12-21 NOTE — Patient Instructions (Signed)
Cervical Radiculopathy Introduction Cervical radiculopathy happens when a nerve in the neck (cervical nerve) is pinched or bruised. This condition can develop because of an injury or as part of the normal aging process. Pressure on the cervical nerves can cause pain or numbness that runs from the neck all the way down into the arm and fingers. Usually, this condition gets better with rest. Treatment may be needed if the condition does not improve. What are the causes? This condition may be caused by:  Injury.  Slipped (herniated) disk.  Muscle tightness in the neck because of overuse.  Arthritis.  Breakdown or degeneration in the bones and joints of the spine (spondylosis) due to aging.  Bone spurs that may develop near the cervical nerves. What are the signs or symptoms? Symptoms of this condition include:  Pain that runs from the neck to the arm and hand. The pain can be severe or irritating. It may be worse when the neck is moved.  Numbness or weakness in the affected arm and hand. How is this diagnosed? This condition may be diagnosed based on symptoms, medical history, and a physical exam. You may also have tests, including:  X-rays.  CT scan.  MRI.  Electromyogram (EMG).  Nerve conduction tests. How is this treated? In many cases, treatment is not needed for this condition. With rest, the condition usually gets better over time. If treatment is needed, options may include:  Wearing a soft neck collar for short periods of time.  Physical therapy to strengthen your neck muscles.  Medicines, such as NSAIDs, oral corticosteroids, or spinal injections.  Surgery. This may be needed if other treatments do not help. Various types of surgery may be done depending on the cause of your problems. Follow these instructions at home: Managing pain  Take over-the-counter and prescription medicines only as told by your health care provider.  If directed, apply ice to the  affected area.  Put ice in a plastic bag.  Place a towel between your skin and the bag.  Leave the ice on for 20 minutes, 2-3 times per day.  If ice does not help, you can try using heat. Take a warm shower or warm bath, or use a heat pack as told by your health care provider.  Try a gentle neck and shoulder massage to help relieve symptoms. Activity  Rest as needed. Follow instructions from your health care provider about any restrictions on activities.  Do stretching and strengthening exercises as told by your health care provider or physical therapist. General instructions  If you were given a soft collar, wear it as told by your health care provider.  Use a flat pillow when you sleep.  Keep all follow-up visits as told by your health care provider. This is important. Contact a health care provider if:  Your condition does not improve with treatment. Get help right away if:  Your pain gets much worse and cannot be controlled with medicines.  You have weakness or numbness in your hand, arm, face, or leg.  You have a high fever.  You have a stiff, rigid neck.  You lose control of your bowels or your bladder (have incontinence).  You have trouble with walking, balance, or speaking. This information is not intended to replace advice given to you by your health care provider. Make sure you discuss any questions you have with your health care provider. Document Released: 07/11/2001 Document Revised: 03/23/2016 Document Reviewed: 12/10/2014  2017 Elsevier

## 2016-12-22 ENCOUNTER — Encounter (INDEPENDENT_AMBULATORY_CARE_PROVIDER_SITE_OTHER): Payer: Medicare Other | Admitting: Physical Medicine and Rehabilitation

## 2016-12-22 NOTE — Procedures (Signed)
NCV & EMG Findings: All nerve conduction studies (as indicated in the following tables) were within normal limits.    Needle evaluation of the left first dorsal interosseous muscle showed increased insertional activity.  The left extensor digitorum muscle showed moderately increased spontaneous activity and diminished recruitment.  The left triceps muscle showed slightly increased spontaneous activity and diminished recruitment.  The left pronator teres muscle showed increased insertional activity, increased spontaneous activity, and diminished recruitment.  All remaining muscles (as indicated in the following table) showed no evidence of electrical instability.    Impression: The above electrodiagnostic study is ABNORMAL and reveals evidence of severe acute C7 radiculopathy on the left.  There is no electrodiagnostic evidence of any other focal nerve entrapment, brachial plexopathy or generalized peripheral neuropathy.   Recommendations: 1.  Follow-up with referring physician. 2.  Continue current management of symptoms. 3.  Recommend C7-T1 intlerlaminar epidural steroid injection diagnostically and therapeutically and referral to spine surgeon for evaluation.   Nerve Conduction Studies Anti Sensory Summary Table   Stim Site NR Peak (ms) Norm Peak (ms) P-T Amp (V) Norm P-T Amp Site1 Site2 Delta-P (ms) Dist (cm) Vel (m/s) Norm Vel (m/s)  Left Median Anti Sensory (2nd Digit)  33.3C  Wrist    3.2 <3.6 30.0 >10 Wrist 2nd Digit 3.2 14.0 44 >39  Elbow    1.5  35.9  Elbow Wrist 1.7 0.0  >48  Left Radial Anti Sensory (Base 1st Digit)  32.8C  Wrist    1.8 <3.1 17.4  Wrist Base 1st Digit 1.8 0.0    Left Ulnar Anti Sensory (5th Digit)  33.1C  Wrist    2.7 <3.7 28.1 >15.0 Wrist 5th Digit 2.7 14.0 52 >38   Motor Summary Table   Stim Site NR Onset (ms) Norm Onset (ms) O-P Amp (mV) Norm O-P Amp Site1 Site2 Delta-0 (ms) Dist (cm) Vel (m/s) Norm Vel (m/s)  Left Median Motor (Abd Poll Brev)  32.7C   Wrist    3.3 <4.2 5.7 >5 Elbow Wrist 3.8 22.5 59 >50  Elbow    7.1  5.5         Left Ulnar Motor (Abd Dig Min)  32.8C  Wrist    2.7 <4.2 8.4 >3 B Elbow Wrist 3.1 19.0 61 >53  B Elbow    5.8  7.1  A Elbow B Elbow 1.5 10.0 67 >53  A Elbow    7.3  6.0          EMG   Side Muscle Nerve Root Ins Act Fibs Psw Amp Dur Poly Recrt Int Fraser Din Comment  Left 1stDorInt Ulnar C8-T1 Incr Nml Nml Nml Nml 0 Nml Nml   Left Abd Poll Brev Median C8-T1 Nml Nml Nml Nml Nml 0 Nml Nml   Left Triceps Radial C6-7-8 Nml 1+ 1+ Nml Nml 0 Reduced Nml   Left Ext Digitorum Radial (Post Int) C7-8 Nml 2+ 2+ Nml Nml 0 Reduced Nml   Left Biceps Musculocut C5-6 Nml Nml Nml Nml Nml 0 Nml Nml   Left Deltoid Axillary C5-6 Nml Nml Nml Nml Nml 0 Nml Nml   Left PronatorTeres Median C6-7 Incr 3+ 3+ Nml Nml 0 Reduced Nml     Nerve Conduction Studies Anti Sensory Left/Right Comparison   Stim Site L Lat (ms) R Lat (ms) L-R Lat (ms) L Amp (V) R Amp (V) L-R Amp (%) Site1 Site2 L Vel (m/s) R Vel (m/s) L-R Vel (m/s)  Median Anti Sensory (2nd Digit)  33.3C  Wrist 3.2   30.0   Wrist 2nd Digit 44    Elbow 1.5   35.9   Elbow Wrist     Radial Anti Sensory (Base 1st Digit)  32.8C  Wrist 1.8   17.4   Wrist Base 1st Digit     Ulnar Anti Sensory (5th Digit)  33.1C  Wrist 2.7   28.1   Wrist 5th Digit 52     Motor Left/Right Comparison   Stim Site L Lat (ms) R Lat (ms) L-R Lat (ms) L Amp (mV) R Amp (mV) L-R Amp (%) Site1 Site2 L Vel (m/s) R Vel (m/s) L-R Vel (m/s)  Median Motor (Abd Poll Brev)  32.7C  Wrist 3.3   5.7   Elbow Wrist 59    Elbow 7.1   5.5         Ulnar Motor (Abd Dig Min)  32.8C  Wrist 2.7   8.4   B Elbow Wrist 61    B Elbow 5.8   7.1   A Elbow B Elbow 67    A Elbow 7.3   6.0

## 2016-12-26 DIAGNOSIS — M5412 Radiculopathy, cervical region: Secondary | ICD-10-CM | POA: Insufficient documentation

## 2016-12-26 DIAGNOSIS — R29898 Other symptoms and signs involving the musculoskeletal system: Secondary | ICD-10-CM | POA: Insufficient documentation

## 2017-01-01 ENCOUNTER — Ambulatory Visit (INDEPENDENT_AMBULATORY_CARE_PROVIDER_SITE_OTHER): Payer: Medicare Other

## 2017-01-01 VITALS — BP 128/80 | HR 81 | Temp 98.6°F | Ht 63.5 in | Wt 179.5 lb

## 2017-01-01 DIAGNOSIS — Z0000 Encounter for general adult medical examination without abnormal findings: Secondary | ICD-10-CM | POA: Diagnosis not present

## 2017-01-01 DIAGNOSIS — E559 Vitamin D deficiency, unspecified: Secondary | ICD-10-CM

## 2017-01-01 DIAGNOSIS — Z1159 Encounter for screening for other viral diseases: Secondary | ICD-10-CM | POA: Diagnosis not present

## 2017-01-01 DIAGNOSIS — E784 Other hyperlipidemia: Secondary | ICD-10-CM

## 2017-01-01 DIAGNOSIS — I10 Essential (primary) hypertension: Secondary | ICD-10-CM

## 2017-01-01 DIAGNOSIS — E7849 Other hyperlipidemia: Secondary | ICD-10-CM

## 2017-01-01 LAB — COMPREHENSIVE METABOLIC PANEL
ALT: 18 U/L (ref 0–35)
AST: 20 U/L (ref 0–37)
Albumin: 4.3 g/dL (ref 3.5–5.2)
Alkaline Phosphatase: 76 U/L (ref 39–117)
BILIRUBIN TOTAL: 0.6 mg/dL (ref 0.2–1.2)
BUN: 12 mg/dL (ref 6–23)
CO2: 26 mEq/L (ref 19–32)
CREATININE: 0.76 mg/dL (ref 0.40–1.20)
Calcium: 9.9 mg/dL (ref 8.4–10.5)
Chloride: 102 mEq/L (ref 96–112)
GFR: 80.72 mL/min (ref >60.00)
GLUCOSE: 100 mg/dL — AB (ref 70–99)
Potassium: 3.5 mEq/L (ref 3.5–5.1)
SODIUM: 143 meq/L (ref 135–145)
Total Protein: 7.2 g/dL (ref 6.0–8.3)

## 2017-01-01 LAB — CBC WITH DIFFERENTIAL/PLATELET
Basophils Absolute: 0 10*3/uL (ref 0.0–0.1)
Basophils Relative: 0.2 % (ref 0.0–3.0)
EOS PCT: 1.9 % (ref 0.0–5.0)
Eosinophils Absolute: 0.2 10*3/uL (ref 0.0–0.7)
HCT: 42 % (ref 36.0–46.0)
HEMOGLOBIN: 14.7 g/dL (ref 12.0–15.0)
LYMPHS PCT: 22.9 % (ref 12.0–46.0)
Lymphs Abs: 2.2 10*3/uL (ref 0.7–4.0)
MCHC: 35 g/dL (ref 30.0–36.0)
MCV: 91.7 fl (ref 78.0–100.0)
MONO ABS: 0.7 10*3/uL (ref 0.1–1.0)
MONOS PCT: 6.7 % (ref 3.0–12.0)
Neutro Abs: 6.7 10*3/uL (ref 1.4–7.7)
Neutrophils Relative %: 68.3 % (ref 43.0–77.0)
Platelets: 392 10*3/uL (ref 150.0–400.0)
RBC: 4.58 Mil/uL (ref 3.87–5.11)
RDW: 12.7 % (ref 11.5–15.5)
WBC: 9.8 10*3/uL (ref 4.0–10.5)

## 2017-01-01 LAB — LIPID PANEL
Cholesterol: 280 mg/dL — ABNORMAL HIGH (ref 0–200)
HDL: 44.4 mg/dL (ref >39.00)
NonHDL: 235.79
Total CHOL/HDL Ratio: 6
Triglycerides: 229 mg/dL — ABNORMAL HIGH (ref 0.0–149.0)
VLDL: 45.8 mg/dL — AB (ref 0.0–40.0)

## 2017-01-01 LAB — VITAMIN D 25 HYDROXY (VIT D DEFICIENCY, FRACTURES): VITD: 21.04 ng/mL — ABNORMAL LOW (ref 30.00–100.00)

## 2017-01-01 LAB — LDL CHOLESTEROL, DIRECT: LDL DIRECT: 181 mg/dL

## 2017-01-01 NOTE — Progress Notes (Signed)
PCP notes:   Health maintenance:  Bone density - pt will complete at next mammogram appt; assessment by PCP prior to ordering Flu vaccine - pt declined PNA vaccine - pt declined Hep C screening - completed  Abnormal screenings:   Fall risk - hx of fall without injury  Patient concerns:   None  Nurse concerns:  None  Next PCP appt:   01/09/17 @ 0830

## 2017-01-01 NOTE — Progress Notes (Signed)
I reviewed health advisor's note, was available for consultation, and agree with documentation and plan.  

## 2017-01-01 NOTE — Patient Instructions (Signed)
Nicole Daniels , Thank you for taking time to come for your Medicare Wellness Visit. I appreciate your ongoing commitment to your health goals. Please review the following plan we discussed and let me know if I can assist you in the future.   These are the goals we discussed: Goals    . Increase physical activity          Starting 01/01/2017, I will continue to exercise at least 30 min 1-2 days per week.        This is a list of the screening recommended for you and due dates:  Health Maintenance  Topic Date Due  . DEXA scan (bone density measurement)  01/01/2018*  . Flu Shot  01/28/2024*  . Pneumonia vaccines (1 of 2 - PCV13) 01/01/2026*  . Mammogram  07/10/2018  . Tetanus Vaccine  08/25/2018  . Colon Cancer Screening  01/14/2020  .  Hepatitis C: One time screening is recommended by Center for Disease Control  (CDC) for  adults born from [birth year] through 1966.   Completed  *Topic was postponed. The date shown is not the original due date.   Preventive Care for Adults  A healthy lifestyle and preventive care can promote health and wellness. Preventive health guidelines for adults include the following key practices.  . A routine yearly physical is a good way to check with your health care provider about your health and preventive screening. It is a chance to share any concerns and updates on your health and to receive a thorough exam.  . Visit your dentist for a routine exam and preventive care every 6 months. Brush your teeth twice a day and floss once a day. Good oral hygiene prevents tooth decay and gum disease.  . The frequency of eye exams is based on your age, health, family medical history, use  of contact lenses, and other factors. Follow your health care provider's ecommendations for frequency of eye exams.  . Eat a healthy diet. Foods like vegetables, fruits, whole grains, low-fat dairy products, and lean protein foods contain the nutrients you need without too many calories.  Decrease your intake of foods high in solid fats, added sugars, and salt. Eat the right amount of calories for you. Get information about a proper diet from your health care provider, if necessary.  . Regular physical exercise is one of the most important things you can do for your health. Most adults should get at least 150 minutes of moderate-intensity exercise (any activity that increases your heart rate and causes you to sweat) each week. In addition, most adults need muscle-strengthening exercises on 2 or more days a week.  Silver Sneakers may be a benefit available to you. To determine eligibility, you may visit the website: www.silversneakers.com or contact program at 502-676-5698 Mon-Fri between 8AM-8PM.   . Maintain a healthy weight. The body mass index (BMI) is a screening tool to identify possible weight problems. It provides an estimate of body fat based on height and weight. Your health care provider can find your BMI and can help you achieve or maintain a healthy weight.   For adults 20 years and older: ? A BMI below 18.5 is considered underweight. ? A BMI of 18.5 to 24.9 is normal. ? A BMI of 25 to 29.9 is considered overweight. ? A BMI of 30 and above is considered obese.   . Maintain normal blood lipids and cholesterol levels by exercising and minimizing your intake of saturated fat. Eat a balanced diet  with plenty of fruit and vegetables. Blood tests for lipids and cholesterol should begin at age 65 and be repeated every 5 years. If your lipid or cholesterol levels are high, you are over 50, or you are at high risk for heart disease, you may need your cholesterol levels checked more frequently. Ongoing high lipid and cholesterol levels should be treated with medicines if diet and exercise are not working.  . If you smoke, find out from your health care provider how to quit. If you do not use tobacco, please do not start.  . If you choose to drink alcohol, please do not consume  more than 2 drinks per day. One drink is considered to be 12 ounces (355 mL) of beer, 5 ounces (148 mL) of wine, or 1.5 ounces (44 mL) of liquor.  . If you are 101-35 years old, ask your health care provider if you should take aspirin to prevent strokes.  . Use sunscreen. Apply sunscreen liberally and repeatedly throughout the day. You should seek shade when your shadow is shorter than you. Protect yourself by wearing long sleeves, pants, a wide-brimmed hat, and sunglasses year round, whenever you are outdoors.  . Once a month, do a whole body skin exam, using a mirror to look at the skin on your back. Tell your health care provider of new moles, moles that have irregular borders, moles that are larger than a pencil eraser, or moles that have changed in shape or color.

## 2017-01-01 NOTE — Progress Notes (Signed)
Subjective:   Nicole Daniels is a 66 y.o. female who presents for an Initial Medicare Annual Wellness Visit.  Review of Systems    N/A  Cardiac Risk Factors include: advanced age (>46men, >39 women);dyslipidemia;hypertension;obesity (BMI >30kg/m2)     Objective:    Today's Vitals   01/01/17 1133  BP: 128/80  Pulse: 81  Temp: 98.6 F (37 C)  TempSrc: Oral  SpO2: 97%  Weight: 179 lb 8 oz (81.4 kg)  Height: 5' 3.5" (1.613 m)  PainSc: 1    Body mass index is 31.3 kg/m.   Current Medications (verified) Outpatient Encounter Prescriptions as of 01/01/2017  Medication Sig  . aspirin 325 MG EC tablet 325 mg.  . gabapentin (NEURONTIN) 300 MG capsule 1 by mouth at night for 7 days then 1 by mouth twice per day for 7 days then 1 by mouth three times per day.  . hydrochlorothiazide (HYDRODIURIL) 25 MG tablet TAKE 1 TABLET (25 MG TOTAL) BY MOUTH AT BEDTIME.  Marland Kitchen HYDROcodone-acetaminophen (NORCO) 5-325 MG tablet Take 1 tablet by mouth every 6 (six) hours as needed.  Marland Kitchen tiZANidine (ZANAFLEX) 4 MG tablet Take 1 tablet (4 mg total) by mouth every 6 (six) hours as needed for muscle spasms.  . diclofenac (VOLTAREN) 75 MG EC tablet Take 1 tablet (75 mg total) by mouth 2 (two) times daily. (Patient not taking: Reported on 01/01/2017)  . methocarbamol (ROBAXIN) 500 MG tablet Take 1 tablet (500 mg total) by mouth every 6 (six) hours as needed for muscle spasms. (Patient not taking: Reported on 12/21/2016)  . predniSONE (DELTASONE) 20 MG tablet Take 2 tablets (40 mg total) by mouth daily with breakfast. (Patient not taking: Reported on 11/17/2016)  . predniSONE (STERAPRED UNI-PAK 21 TAB) 10 MG (21) TBPK tablet Take as directed (Patient not taking: Reported on 12/21/2016)   No facility-administered encounter medications on file as of 01/01/2017.     Allergies (verified) Bactrim [sulfamethoxazole-trimethoprim] and Simvastatin   History: Past Medical History:  Diagnosis Date  . Abdominal pain,  right lower quadrant   . Acquired cyst of kidney 01/08/2012   pt denies history of   . Acute sinusitis, unspecified   . Adenomatous colon polyp   . Angiomyolipoma    kidney  . Chest pain, unspecified   . Depressive disorder, not elsewhere classified 01/08/2012   not currently   . Diverticulosis   . Dizziness and giddiness   . Dysfunction of eustachian tube   . Hemorrhoids   . History of diverticulitis of colon   . IBS (irritable bowel syndrome)   . Liver lesion 2011  . Migraine, unspecified, without mention of intractable migraine without mention of status migrainosus   . Neoplasm of skin of face    malignant  . OA (osteoarthritis)    knee  . Obesity, unspecified   . Ostium secundum type atrial septal defect   . Other and unspecified hyperlipidemia   . Other malaise and fatigue   . Palpitations   . Shortness of breath   . Unspecified essential hypertension   . Unspecified sinusitis (chronic)   . Unspecified vitamin D deficiency    Past Surgical History:  Procedure Laterality Date  . ABDOMINAL HYSTERECTOMY    . BREAST REDUCTION SURGERY  1990  . CHOLECYSTECTOMY    . DILATION AND CURETTAGE OF UTERUS    . FLEXIBLE SIGMOIDOSCOPY  01/08/2012   Procedure: FLEXIBLE SIGMOIDOSCOPY;  Surgeon: Lafayette Dragon, MD;  Location: [facility];  Service: Endoscopy;  Laterality:  N/A;  . KNEE ARTHROTOMY     right  . LEFT OOPHORECTOMY  1976  . PARTIAL HYSTERECTOMY     right ovary remains  . TONSILLECTOMY  1972   Family History  Problem Relation Age of Onset  . Cirrhosis Father   . Heart disease Father   . Hypertension Mother   . Diabetes Mother   . Breast cancer Cousin   . Hypertension Sister   . Diabetes Cousin   . Diabetes      aunt  . Stroke      aunt  . Heart disease Brother     x 2  . Colon cancer Neg Hx    Social History   Occupational History  . 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 Smokeless tobacco:  Never Used  . Alcohol use No  . Drug use: No  . Sexual activity: No    Tobacco Counseling Counseling given: No   Activities of Daily Living In your present state of health, do you have any difficulty performing the following activities: 01/01/2017  Hearing? N  Vision? Y  Difficulty concentrating or making decisions? N  Walking or climbing stairs? N  Dressing or bathing? N  Doing errands, shopping? N  Preparing Food and eating ? N  Using the Toilet? N  In the past six months, have you accidently leaked urine? N  Do you have problems with loss of bowel control? N  Managing your Medications? N  Managing your Finances? N  Housekeeping or managing your Housekeeping? N  Some recent data might be hidden    Immunizations and Health Maintenance Immunization History  Administered Date(s) Administered  . Influenza,inj,Quad PF,36+ Mos 09/01/2015  . Td 10/30/1988, 08/25/2008   There are no preventive care reminders to display for this patient.  Patient Care Team: Lucille Passy, MD as PCP - General (Family Medicine)     Assessment:   This is a routine wellness examination for Nicole Daniels.   Hearing/Vision screen  Hearing Screening   125Hz  250Hz  500Hz  1000Hz  2000Hz  3000Hz  4000Hz  6000Hz  8000Hz   Right ear:   40 40 40  40    Left ear:   40 40 40  40      Visual Acuity Screening   Right eye Left eye Both eyes  Without correction:     With correction: 20/40 20/40 20/40     Dietary issues and exercise activities discussed: Current Exercise Habits: Home exercise routine, Type of exercise: walking, Time (Minutes): 30, Frequency (Times/Week): 1, Weekly Exercise (Minutes/Week): 30, Intensity: Mild, Exercise limited by: None identified  Goals    . Increase physical activity          Starting 01/01/2017, I will continue to exercise at least 30 min 1-2 days per week.       Depression Screen PHQ 2/9 Scores 01/01/2017  PHQ - 2 Score 0    Fall Risk Fall Risk  01/01/2017  Falls in the past  year? Yes  Number falls in past yr: 1  Injury with Fall? No    Cognitive Function: MMSE - Mini Mental State Exam 01/01/2017  Orientation to time 5  Orientation to Place 5  Registration 3  Attention/ Calculation 0  Recall 3  Language- name 2 objects 0  Language- repeat 1  Language- follow 3 step command 3  Language- read & follow direction 0  Write a sentence 0  Copy design 0  Total  score 20     PLEASE NOTE: A Mini-Cog screen was completed. Maximum score is 20. A value of 0 denotes this part of Folstein MMSE was not completed or the patient failed this part of the Mini-Cog screening.   Mini-Cog Screening Orientation to Time - Max 5 pts Orientation to Place - Max 5 pts Registration - Max 3 pts Recall - Max 3 pts Language Repeat - Max 1 pts Language Follow 3 Step Command - Max 3 pts     Screening Tests Health Maintenance  Topic Date Due  . DEXA SCAN  01/01/2018 (Originally 03/01/2015)  . INFLUENZA VACCINE  01/28/2024 (Originally 05/30/2016)  . PNA vac Low Risk Adult (1 of 2 - PCV13) 01/01/2026 (Originally 03/01/2015)  . MAMMOGRAM  07/10/2018  . TETANUS/TDAP  08/25/2018  . COLONOSCOPY  01/14/2020  . Hepatitis C Screening  Completed      Plan:    I have personally reviewed and addressed the Medicare Annual Wellness questionnaire and have noted the following in the patient's chart:  A. Medical and social history B. Use of alcohol, tobacco or illicit drugs  C. Current medications and supplements D. Functional ability and status E.  Nutritional status F.  Physical activity G. Advance directives H. List of other physicians I.  Hospitalizations, surgeries, and ER visits in previous 12 months J.  [place] to include hearing, vision, cognitive, depression L. Referrals and appointments - none  In addition, I have reviewed and discussed with patient certain preventive protocols, quality metrics, and best practice recommendations. A written personalized care plan for  preventive services as well as general preventive health recommendations were provided to patient.  See attached scanned questionnaire for additional information.   Signed,   Lindell Noe, MHA, BS, LPN Health Coach

## 2017-01-01 NOTE — Progress Notes (Signed)
Pre visit review using our clinic review tool, if applicable. No additional management support is needed unless otherwise documented below in the visit note. 

## 2017-01-02 LAB — HEPATITIS C ANTIBODY: HCV AB: NEGATIVE

## 2017-01-05 ENCOUNTER — Other Ambulatory Visit: Payer: Self-pay | Admitting: Neurosurgery

## 2017-01-05 DIAGNOSIS — R29898 Other symptoms and signs involving the musculoskeletal system: Secondary | ICD-10-CM | POA: Insufficient documentation

## 2017-01-09 ENCOUNTER — Encounter: Payer: Medicare Other | Admitting: Family Medicine

## 2017-01-11 ENCOUNTER — Encounter: Payer: Self-pay | Admitting: Family Medicine

## 2017-01-11 ENCOUNTER — Ambulatory Visit (INDEPENDENT_AMBULATORY_CARE_PROVIDER_SITE_OTHER): Payer: Medicare Other | Admitting: Family Medicine

## 2017-01-11 VITALS — BP 114/80 | HR 76 | Temp 98.2°F | Wt 178.0 lb

## 2017-01-11 DIAGNOSIS — Z0000 Encounter for general adult medical examination without abnormal findings: Secondary | ICD-10-CM

## 2017-01-11 DIAGNOSIS — Z01419 Encounter for gynecological examination (general) (routine) without abnormal findings: Secondary | ICD-10-CM | POA: Insufficient documentation

## 2017-01-11 DIAGNOSIS — I10 Essential (primary) hypertension: Secondary | ICD-10-CM | POA: Diagnosis not present

## 2017-01-11 DIAGNOSIS — E559 Vitamin D deficiency, unspecified: Secondary | ICD-10-CM

## 2017-01-11 DIAGNOSIS — E785 Hyperlipidemia, unspecified: Secondary | ICD-10-CM

## 2017-01-11 MED ORDER — PRAVASTATIN SODIUM 20 MG PO TABS: 20.0000 mg | ORAL_TABLET | Freq: Every day | ORAL | 3 refills | Status: DC

## 2017-01-11 NOTE — Assessment & Plan Note (Signed)
Reviewed preventive care protocols, scheduled due services, and updated immunizations Discussed nutrition, exercise, diet, and healthy lifestyle.  

## 2017-01-11 NOTE — Assessment & Plan Note (Signed)
Deteriorated. Agrees to start a statin- eRx sent for pravastatin 20 mg daily. Follow up labs in 8 weeks.

## 2017-01-11 NOTE — Assessment & Plan Note (Signed)
Well controlled 

## 2017-01-11 NOTE — Assessment & Plan Note (Signed)
Deteriorated. Advised OTC Vit D 2000 IU daily.

## 2017-01-11 NOTE — Progress Notes (Addendum)
   Subjective:   Patient ID: Nicole Daniels, female    DOB: [birth year], 66 y.o.   MRN: [id number]  Nicole Daniels is a pleasant 66 y.o. year old female who presents to clinic today with Medicare Wellness (Part 2. Saw Lesia 01-01-17.)  on 01/11/2017  HPI:  Medicare wellness visit with Nicole Musa, RN on 01/01/17. Note reviewed.  Scheduled for thoracic laminectomy by Dr. Sherwood Gambler on 01/31/17.  HTN- BP has been well controlled on current dose of HCTZ.  Lab Results  Component Value Date   CREATININE 0.76 01/01/2017   She has been more dizzy lately.  HLD- deteriorated.  Was on simvastatin- caused myalgias and on zetia. Lab Results  Component Value Date   CHOL 280 (H) 01/01/2017   HDL 44.40 01/01/2017   LDLCALC UNABLE TO CALCULATE IF TRIGLYCERIDE OVER 400 mg/dL 03/09/2013   LDLDIRECT 181.0 01/01/2017   TRIG 229.0 (H) 01/01/2017   CHOLHDL 6 01/01/2017      Review of Systems  Constitutional: Negative.   HENT: Negative.   Respiratory: Negative.   Cardiovascular: Negative.   Gastrointestinal: Negative.   Endocrine: Negative.   Genitourinary: Negative.   Musculoskeletal: Positive for back pain.  Allergic/Immunologic: Negative.   Neurological: Negative.   Hematological: Negative.   Psychiatric/Behavioral: Negative.   All other systems reviewed and are negative.      Objective:    BP 114/80 (BP Location: Left Arm, Patient Position: Sitting, Cuff Size: Normal)   Pulse 76   Temp 98.2 F (36.8 C) (Oral)   Wt 178 lb (80.7 kg)   SpO2 95%   BMI 31.04 kg/m    Physical Exam   General:  Well-developed,well-nourished,in no acute distress; alert,appropriate and cooperative throughout examination Head:  normocephalic and atraumatic.   Eyes:  vision grossly intact, PERRL Ears:  R ear normal and L ear normal externally, TMs clear bilaterally Nose:  no external deformity.   Mouth:  good dentition.   Neck:  No deformities, masses, or tenderness noted.   Lungs:   Normal respiratory effort, chest expands symmetrically. Lungs are clear to auscultation, no crackles or wheezes. Heart:  Normal rate and regular rhythm. S1 and S2 normal without gallop, murmur, click, rub or other extra sounds. Abdomen:  Bowel sounds positive,abdomen soft and non-tender without masses, organomegaly or hernias noted. Msk:  No deformity or scoliosis noted of thoracic or lumbar spine.   Extremities:  No clubbing, cyanosis, edema, or deformity noted with normal full range of motion of all joints.   Neurologic:  alert & oriented X3 and gait normal.   Skin:  Intact without suspicious lesions or rashes Cervical Nodes:  No lymphadenopathy noted Axillary Nodes:  No palpable lymphadenopathy Psych:  Cognition and judgment appear intact. Alert and cooperative with normal attention span and concentration. No apparent delusions, illusions, hallucinations        Assessment & Plan:   Well woman exam  Essential hypertension  Hyperlipidemia, unspecified hyperlipidemia type  Vitamin D deficiency No Follow-up on file.

## 2017-01-11 NOTE — Patient Instructions (Addendum)
Good to see you.  We are starting pravastatin 20 mg daily.  Please return in 8 weeks after you start it for labs.  Please also start taking Vitamin D OTC 2000 IU daily.   Good luck with your surgery.  I hope you have a nice birthday.

## 2017-01-11 NOTE — Progress Notes (Signed)
Pre visit review using our clinic review tool, if applicable. No additional management support is needed unless otherwise documented below in the visit note. 

## 2017-01-19 ENCOUNTER — Encounter: Payer: Self-pay | Admitting: Primary Care

## 2017-01-19 ENCOUNTER — Ambulatory Visit (INDEPENDENT_AMBULATORY_CARE_PROVIDER_SITE_OTHER): Payer: Medicare Other | Admitting: Primary Care

## 2017-01-19 VITALS — BP 128/86 | HR 70 | Temp 98.1°F | Ht 64.5 in | Wt 179.0 lb

## 2017-01-19 DIAGNOSIS — B9789 Other viral agents as the cause of diseases classified elsewhere: Secondary | ICD-10-CM

## 2017-01-19 DIAGNOSIS — J069 Acute upper respiratory infection, unspecified: Secondary | ICD-10-CM | POA: Diagnosis not present

## 2017-01-19 NOTE — Patient Instructions (Addendum)
Your symptoms are representative of a viral illness which will resolve on its own over time. Our goal is to treat your symptoms in order to aid your body in the healing process and to make you more comfortable.   Cough/Congestion: Try taking Mucinex DM. This will help loosen up the mucous in your chest. Ensure you take this medication with a full glass of water. You could also try Delsym or Robitussin for cough.  Nasal Congestion/Ear Pressure: Try using Flonase (fluticasone) nasal spray. Instill 1 spray in each nostril twice daily.   Please call me Tuesday next week if no improvement.  It was a pleasure meeting you!

## 2017-01-19 NOTE — Progress Notes (Signed)
Subjective:    Patient ID: Nicole Daniels, female    DOB: Aug 10, 1951, 66 y.o.   MRN: [id number]  HPI  Nicole Daniels is a 66 year old female who presents today with a chief complaint of sinus pressure. She also reports cough, headaches, low grade fevers. Her cough is productive with yellow sputum. Her symptoms began three days ago. She's taken OTC allergy/sinus medication without much improvement. She is scheduled for a cyst exesion to her spine in early April.  Review of Systems  Constitutional: Positive for fatigue and fever.  HENT: Positive for congestion, postnasal drip, sinus pain and sinus pressure. Negative for ear pain and sore throat.   Respiratory: Positive for cough. Negative for shortness of breath.   Cardiovascular: Negative for chest pain.  Neurological: Positive for headaches.       Past Medical History:  Diagnosis Date  . Abdominal pain, right lower quadrant   . Acquired cyst of kidney 01/08/2012   pt denies history of   . Acute sinusitis, unspecified   . Adenomatous colon polyp   . Angiomyolipoma    kidney  . Chest pain, unspecified   . Depressive disorder, not elsewhere classified 01/08/2012   not currently   . Diverticulosis   . Dizziness and giddiness   . Dysfunction of eustachian tube   . Hemorrhoids   . History of diverticulitis of colon   . IBS (irritable bowel syndrome)   . Liver lesion 2011  . Migraine, unspecified, without mention of intractable migraine without mention of status migrainosus   . Neoplasm of skin of face    malignant  . OA (osteoarthritis)    knee  . Obesity, unspecified   . Ostium secundum type atrial septal defect   . Other and unspecified hyperlipidemia   . Other malaise and fatigue   . Palpitations   . Shortness of breath   . Unspecified essential hypertension   . Unspecified sinusitis (chronic)   . Unspecified vitamin D deficiency      Social History   Social History  . Marital status: Married    Spouse name:  N/A  . Number of children: 2  . Years of education: N/A   Occupational History  . 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 Smokeless tobacco: Never Used  . Alcohol use No  . Drug use: No  . Sexual activity: No   Other Topics Concern  . Not on file   Social History Narrative   Daily caffeine     Past Surgical History:  Procedure Laterality Date  . ABDOMINAL HYSTERECTOMY    . BREAST REDUCTION SURGERY  1990  . CHOLECYSTECTOMY    . DILATION AND CURETTAGE OF UTERUS    . FLEXIBLE SIGMOIDOSCOPY  01/08/2012   Procedure: FLEXIBLE SIGMOIDOSCOPY;  Surgeon: Lafayette Dragon, MD;  Location: [facility];  Service: Endoscopy;  Laterality: N/A;  . KNEE ARTHROTOMY     right  . LEFT OOPHORECTOMY  1976  . PARTIAL HYSTERECTOMY     right ovary remains  . TONSILLECTOMY  1972    Family History  Problem Relation Age of Onset  . Cirrhosis Father   . Heart disease Father   . Hypertension Mother   . Diabetes Mother   . Breast cancer Cousin   . Hypertension Sister   . Diabetes Cousin   . Diabetes      aunt  . Stroke  aunt  . Heart disease Brother     x 2  . Colon cancer Neg Hx     Allergies  Allergen Reactions  . Bactrim [Sulfamethoxazole-Trimethoprim]     itching  . Simvastatin Other (See Comments)    Leg pain    Current Outpatient Prescriptions on File Prior to Visit  Medication Sig Dispense Refill  . Cholecalciferol (VITAMIN D3) 2000 units TABS Take 2,000 Units by mouth daily.    Marland Kitchen gabapentin (NEURONTIN) 300 MG capsule 1 by mouth at night for 7 days then 1 by mouth twice per day for 7 days then 1 by mouth three times per day. (Patient taking differently: Take 600 mg by mouth at bedtime. ) 180 capsule 1  . hydrochlorothiazide (HYDRODIURIL) 25 MG tablet TAKE 1 TABLET (25 MG TOTAL) BY MOUTH AT BEDTIME. (Patient taking differently: TAKE 1 TABLET (25 MG TOTAL) BY MOUTH IN THE MORNING.) 90 tablet 2  .  HYDROcodone-acetaminophen (NORCO) 5-325 MG tablet Take 1 tablet by mouth every 6 (six) hours as needed. (Patient taking differently: Take 1 tablet by mouth every 6 (six) hours as needed. For pain.) 40 tablet 0  . pravastatin (PRAVACHOL) 20 MG tablet Take 1 tablet (20 mg total) by mouth daily. (Patient taking differently: Take 20 mg by mouth at bedtime. ) 90 tablet 3  . tiZANidine (ZANAFLEX) 4 MG tablet Take 1 tablet (4 mg total) by mouth every 6 (six) hours as needed for muscle spasms. (Patient taking differently: Take 4 mg by mouth every 6 (six) hours as needed for muscle spasms. For muscle spasms.) 30 tablet 3   No current facility-administered medications on file prior to visit.     BP 128/86   Pulse 70   Temp 98.1 F (36.7 C)   Ht 5' 4.5" (1.638 m)   Wt 179 lb (81.2 kg)   SpO2 98%   BMI 30.25 kg/m    Objective:   Physical Exam  Constitutional: She appears well-nourished.  HENT:  Right Ear: Tympanic membrane and ear canal normal.  Left Ear: Tympanic membrane and ear canal normal.  Nose: Mucosal edema present. Right sinus exhibits no maxillary sinus tenderness and no frontal sinus tenderness. Left sinus exhibits frontal sinus tenderness. Left sinus exhibits no maxillary sinus tenderness.  Mouth/Throat: Oropharynx is clear and moist.  Eyes: Conjunctivae are normal.  Neck: Neck supple.  Cardiovascular: Normal rate and regular rhythm.   Pulmonary/Chest: Effort normal and breath sounds normal. She has no wheezes. She has no rales.  Lymphadenopathy:    She has no cervical adenopathy.  Skin: Skin is warm and dry.          Assessment & Plan:  Viral URI vs Viral Sinusitis:  Cough, nasal congestion, sinus pressure x 3 days. No improvement with OTC treatment. Exam today without evidence of bacterial involvement. Suspect viral cause and will treat with conservative measures. Mucinex, Flonase, fluids, rest. Follow up PRN.  Sheral Flow, NP

## 2017-01-22 ENCOUNTER — Encounter (HOSPITAL_COMMUNITY): Payer: Self-pay

## 2017-01-22 NOTE — Pre-Procedure Instructions (Signed)
CLYDINE PARKISON  01/22/2017      [id number] - [address], [address] [place] [address] Phone: [phone] Fax: [phone]    Your procedure is scheduled on April 4  Report to [facility] at 630 A.M.  Call this number if you have problems the morning of surgery:  [phone]   Remember:  Do not eat food or drink liquids after midnight.  Take these medicines the morning of surgery with A SIP OF WATER Hydrocodone (Norco) if needed, Tizanidine (Zanaflex) if needed   Do not wear jewelry, make-up or nail polish.  Do not wear lotions, powders, or perfumes, or deoderant.  Do not shave 48 hours prior to surgery.  Men may shave face and neck.  Do not bring valuables to the hospital.  [facility] is not responsible for any belongings or valuables.  Contacts, dentures or bridgework may not be worn into surgery.  Leave your suitcase in the car.  After surgery it may be brought to your room.  For patients admitted to the hospital, discharge time will be determined by your treatment team.  Patients discharged the day of surgery will not be allowed to drive home.    Special instructions:  [place] - Preparing for Surgery  Before surgery, you can play an important role.  Because skin is not sterile, your skin needs to be as free of germs as possible.  You can reduce the number of germs on you skin by washing with CHG (chlorahexidine gluconate) soap before surgery.  CHG is an antiseptic cleaner which kills germs and bonds with the skin to continue killing germs even after washing.  Please DO NOT use if you have an allergy to CHG or antibacterial soaps.  If your skin becomes reddened/irritated stop using the CHG and inform your nurse when you arrive at Short Stay.  Do not shave (including legs and underarms) for at least 48 hours prior to the first CHG shower.  You may shave your face.  Please follow these  instructions carefully:   1.  Shower with CHG Soap the night before surgery and the morning of Surgery.  2.  If you choose to wash your hair, wash your hair first as usual with your  normal shampoo.  3.  After you shampoo, rinse your hair and body thoroughly to remove the Shampoo.  4.  Use CHG as you would any other liquid soap.  You can apply chg directly  to the skin and wash gently with scrungie or a clean washcloth.  5.  Apply the CHG Soap to your body ONLY FROM THE NECK DOWN.    Do not use on open wounds or open sores.  Avoid contact with your eyes,       ears, mouth and genitals (private parts).  Wash genitals (private parts)with your normal soap.  6.  Wash thoroughly, paying special attention to the area where your surgery will be performed.  7.  Thoroughly rinse your body with warm water from the neck down.  8.  DO NOT shower/wash with your normal soap after using and rinsing off   the CHG Soap.  9.  Pat yourself dry with a clean towel.            10.  Wear clean pajamas.            11.  Place clean sheets on your bed the night of your first shower and  do not sleep with pets.  Day of Surgery  Do not apply any lotions/deoderants the morning of surgery.  Please wear clean clothes to the hospital/surgery center.     Please read over the following fact sheets that you were given. Pain Booklet, MRSA Information and Surgical Site Infection Prevention

## 2017-01-23 ENCOUNTER — Encounter (HOSPITAL_COMMUNITY): Payer: Self-pay

## 2017-01-23 ENCOUNTER — Encounter (HOSPITAL_COMMUNITY)
Admission: RE | Admit: 2017-01-23 | Discharge: 2017-01-23 | Disposition: A | Discharge status: Home / Self Care | Payer: Medicare Other | Source: Ambulatory Visit | Attending: Neurosurgery | Admitting: Neurosurgery

## 2017-01-23 DIAGNOSIS — E785 Hyperlipidemia, unspecified: Secondary | ICD-10-CM | POA: Diagnosis not present

## 2017-01-23 DIAGNOSIS — E669 Obesity, unspecified: Secondary | ICD-10-CM | POA: Insufficient documentation

## 2017-01-23 DIAGNOSIS — I10 Essential (primary) hypertension: Secondary | ICD-10-CM | POA: Diagnosis not present

## 2017-01-23 DIAGNOSIS — Z01810 Encounter for preprocedural cardiovascular examination: Secondary | ICD-10-CM | POA: Insufficient documentation

## 2017-01-23 DIAGNOSIS — Z01812 Encounter for preprocedural laboratory examination: Secondary | ICD-10-CM | POA: Diagnosis present

## 2017-01-23 DIAGNOSIS — E559 Vitamin D deficiency, unspecified: Secondary | ICD-10-CM | POA: Insufficient documentation

## 2017-01-23 LAB — CBC
HCT: 41.5 % (ref 36.0–46.0)
HEMOGLOBIN: 13.7 g/dL (ref 12.0–15.0)
MCH: 30.6 pg (ref 26.0–34.0)
MCHC: 33 g/dL (ref 30.0–36.0)
MCV: 92.6 fL (ref 78.0–100.0)
Platelets: 383 10*3/uL (ref 150–400)
RBC: 4.48 MIL/uL (ref 3.87–5.11)
RDW: 12.4 % (ref 11.5–15.5)
WBC: 6.6 10*3/uL (ref 4.0–10.5)

## 2017-01-23 LAB — BASIC METABOLIC PANEL
ANION GAP: 8 (ref 5–15)
BUN: 14 mg/dL (ref 6–20)
CALCIUM: 9.6 mg/dL (ref 8.9–10.3)
CO2: 30 mmol/L (ref 22–32)
CREATININE: 0.74 mg/dL (ref 0.44–1.00)
Chloride: 103 mmol/L (ref 101–111)
GFR calc non Af Amer: >60 mL/min (ref >60)
Glucose, Bld: 99 mg/dL (ref 65–99)
Potassium: 3.4 mmol/L — ABNORMAL LOW (ref 3.5–5.1)
SODIUM: 141 mmol/L (ref 135–145)

## 2017-01-23 LAB — SURGICAL PCR SCREEN
MRSA, PCR: NEGATIVE
STAPHYLOCOCCUS AUREUS: POSITIVE — AB

## 2017-01-23 NOTE — Progress Notes (Signed)
PCP is Dr. Tonna Boehringer Cardiologist is Dr Aundra Dubin, but hasn't seen him in many years- "saw him because her BP was up one time" States she doesn't have HTN, and takes HCTZ for swelling. Denies any chest pain Echo noted from 2006 Stress test 2014 Denies ever having a card cath.

## 2017-01-23 NOTE — Progress Notes (Signed)
Called Ms. Merriwether and informed her of PCR results and need to pick up the script - [facility]

## 2017-01-31 ENCOUNTER — Inpatient Hospital Stay (HOSPITAL_COMMUNITY): Payer: Medicare Other

## 2017-01-31 ENCOUNTER — Inpatient Hospital Stay (HOSPITAL_COMMUNITY): Payer: Medicare Other | Admitting: Anesthesiology

## 2017-01-31 ENCOUNTER — Encounter (HOSPITAL_COMMUNITY): Payer: Self-pay | Admitting: Urology

## 2017-01-31 ENCOUNTER — Encounter (HOSPITAL_COMMUNITY)
Admission: RE | Disposition: A | Discharge status: Home / Self Care | Payer: Self-pay | Source: Ambulatory Visit | Attending: Neurosurgery

## 2017-01-31 ENCOUNTER — Inpatient Hospital Stay (HOSPITAL_COMMUNITY)
Admission: RE | Admit: 2017-01-31 | Discharge: 2017-02-01 | DRG: 030 | Disposition: A | Discharge status: Home / Self Care | Payer: Medicare Other | Source: Ambulatory Visit | Attending: Neurosurgery | Admitting: Neurosurgery

## 2017-01-31 DIAGNOSIS — Z888 Allergy status to other drugs, medicaments and biological substances status: Secondary | ICD-10-CM | POA: Diagnosis not present

## 2017-01-31 DIAGNOSIS — G9612 Meningeal adhesions (cerebral) (spinal): Secondary | ICD-10-CM | POA: Diagnosis present

## 2017-01-31 DIAGNOSIS — Z833 Family history of diabetes mellitus: Secondary | ICD-10-CM

## 2017-01-31 DIAGNOSIS — Z90710 Acquired absence of both cervix and uterus: Secondary | ICD-10-CM

## 2017-01-31 DIAGNOSIS — I10 Essential (primary) hypertension: Secondary | ICD-10-CM | POA: Diagnosis present

## 2017-01-31 DIAGNOSIS — G9619 Other disorders of meninges, not elsewhere classified: Secondary | ICD-10-CM | POA: Diagnosis present

## 2017-01-31 DIAGNOSIS — Z6831 Body mass index (BMI) 31.0-31.9, adult: Secondary | ICD-10-CM | POA: Diagnosis not present

## 2017-01-31 DIAGNOSIS — K589 Irritable bowel syndrome without diarrhea: Secondary | ICD-10-CM | POA: Diagnosis present

## 2017-01-31 DIAGNOSIS — E785 Hyperlipidemia, unspecified: Secondary | ICD-10-CM | POA: Diagnosis present

## 2017-01-31 DIAGNOSIS — G96198 Other disorders of meninges, not elsewhere classified: Secondary | ICD-10-CM | POA: Diagnosis present

## 2017-01-31 DIAGNOSIS — N281 Cyst of kidney, acquired: Secondary | ICD-10-CM | POA: Diagnosis present

## 2017-01-31 DIAGNOSIS — Z803 Family history of malignant neoplasm of breast: Secondary | ICD-10-CM

## 2017-01-31 DIAGNOSIS — G43909 Migraine, unspecified, not intractable, without status migrainosus: Secondary | ICD-10-CM | POA: Diagnosis present

## 2017-01-31 DIAGNOSIS — E669 Obesity, unspecified: Secondary | ICD-10-CM | POA: Diagnosis present

## 2017-01-31 DIAGNOSIS — Z9049 Acquired absence of other specified parts of digestive tract: Secondary | ICD-10-CM

## 2017-01-31 DIAGNOSIS — Z90721 Acquired absence of ovaries, unilateral: Secondary | ICD-10-CM

## 2017-01-31 DIAGNOSIS — Z79899 Other long term (current) drug therapy: Secondary | ICD-10-CM

## 2017-01-31 DIAGNOSIS — Z8249 Family history of ischemic heart disease and other diseases of the circulatory system: Secondary | ICD-10-CM

## 2017-01-31 DIAGNOSIS — Z419 Encounter for procedure for purposes other than remedying health state, unspecified: Secondary | ICD-10-CM

## 2017-01-31 DIAGNOSIS — G9529 Other cord compression: Secondary | ICD-10-CM | POA: Diagnosis present

## 2017-01-31 HISTORY — PX: LAMINECTOMY: SHX219

## 2017-01-31 SURGERY — THORACIC LAMINECTOMY FOR TUMOR
Anesthesia: General

## 2017-01-31 MED ORDER — THROMBIN 20000 UNITS EX SOLR
CUTANEOUS | Status: DC | PRN
  Administered 2017-01-31: 20 mL via TOPICAL

## 2017-01-31 MED ORDER — SUCCINYLCHOLINE CHLORIDE 20 MG/ML IJ SOLN
INTRAMUSCULAR | Status: DC | PRN
  Administered 2017-01-31: 100 mg via INTRAVENOUS

## 2017-01-31 MED ORDER — ACETAMINOPHEN 10 MG/ML IV SOLN
INTRAVENOUS | Status: DC | PRN
  Administered 2017-01-31: 1000 mg via INTRAVENOUS

## 2017-01-31 MED ORDER — GABAPENTIN 300 MG PO CAPS
600.0000 mg | ORAL_CAPSULE | Freq: Every day | ORAL | Status: DC
  Administered 2017-01-31: 600 mg via ORAL
  Filled 2017-01-31: qty 2

## 2017-01-31 MED ORDER — DEXAMETHASONE SODIUM PHOSPHATE 10 MG/ML IJ SOLN
INTRAMUSCULAR | Status: AC
  Filled 2017-01-31: qty 1

## 2017-01-31 MED ORDER — MIDAZOLAM HCL 2 MG/2ML IJ SOLN
INTRAMUSCULAR | Status: DC | PRN
  Administered 2017-01-31: 2 mg via INTRAVENOUS

## 2017-01-31 MED ORDER — SUGAMMADEX SODIUM 500 MG/5ML IV SOLN
INTRAVENOUS | Status: DC | PRN
  Administered 2017-01-31: 162.4 mg via INTRAVENOUS

## 2017-01-31 MED ORDER — CYCLOBENZAPRINE HCL 10 MG PO TABS: 5.0000 mg | ORAL_TABLET | Freq: Three times a day (TID) | ORAL | Status: DC | PRN

## 2017-01-31 MED ORDER — PHENYLEPHRINE HCL 10 MG/ML IJ SOLN
INTRAMUSCULAR | Status: DC | PRN
  Administered 2017-01-31 (×2): 80 ug via INTRAVENOUS

## 2017-01-31 MED ORDER — KETOROLAC TROMETHAMINE 15 MG/ML IJ SOLN
15.0000 mg | Freq: Once | INTRAMUSCULAR | Status: AC
  Administered 2017-01-31: 15 mg via INTRAVENOUS

## 2017-01-31 MED ORDER — FLEET ENEMA 7-19 GM/118ML RE ENEM: 1.0000 | ENEMA | Freq: Once | RECTAL | Status: DC | PRN

## 2017-01-31 MED ORDER — MAGNESIUM HYDROXIDE 400 MG/5ML PO SUSP: 30.0000 mL | Freq: Every day | ORAL | Status: DC | PRN

## 2017-01-31 MED ORDER — FAMOTIDINE 20 MG PO TABS
20.0000 mg | ORAL_TABLET | Freq: Two times a day (BID) | ORAL | Status: DC
Start: 2017-01-31 — End: 2017-02-01
  Administered 2017-01-31 – 2017-02-01 (×2): 20 mg via ORAL
  Filled 2017-01-31 (×2): qty 1

## 2017-01-31 MED ORDER — ALUM & MAG HYDROXIDE-SIMETH 200-200-20 MG/5ML PO SUSP: 30.0000 mL | Freq: Four times a day (QID) | ORAL | Status: DC | PRN

## 2017-01-31 MED ORDER — 0.9 % SODIUM CHLORIDE (POUR BTL) OPTIME
TOPICAL | Status: DC | PRN
  Administered 2017-01-31: 1000 mL

## 2017-01-31 MED ORDER — SUCCINYLCHOLINE CHLORIDE 200 MG/10ML IV SOSY
PREFILLED_SYRINGE | INTRAVENOUS | Status: AC
  Filled 2017-01-31: qty 10

## 2017-01-31 MED ORDER — VITAMIN D 1000 UNITS PO TABS
2000.0000 [IU] | ORAL_TABLET | Freq: Every day | ORAL | Status: DC
  Administered 2017-02-01: 2000 [IU] via ORAL
  Filled 2017-01-31: qty 2

## 2017-01-31 MED ORDER — HYDROCODONE-ACETAMINOPHEN 5-325 MG PO TABS: 1.0000 | ORAL_TABLET | ORAL | Status: DC | PRN

## 2017-01-31 MED ORDER — LIDOCAINE 2% (20 MG/ML) 5 ML SYRINGE
INTRAMUSCULAR | Status: AC
  Filled 2017-01-31: qty 5

## 2017-01-31 MED ORDER — ONDANSETRON HCL 4 MG PO TABS: 4.0000 mg | ORAL_TABLET | Freq: Four times a day (QID) | ORAL | Status: DC | PRN

## 2017-01-31 MED ORDER — HYDROXYZINE HCL 25 MG PO TABS: 50.0000 mg | ORAL_TABLET | ORAL | Status: DC | PRN

## 2017-01-31 MED ORDER — FENTANYL CITRATE (PF) 100 MCG/2ML IJ SOLN
INTRAMUSCULAR | Status: DC | PRN
  Administered 2017-01-31 (×2): 100 ug via INTRAVENOUS
  Administered 2017-01-31: 150 ug via INTRAVENOUS

## 2017-01-31 MED ORDER — SODIUM CHLORIDE 0.9 % IV SOLN: 250.0000 mL | INTRAVENOUS | Status: DC

## 2017-01-31 MED ORDER — MENTHOL 3 MG MT LOZG: 1.0000 | LOZENGE | OROMUCOSAL | Status: DC | PRN

## 2017-01-31 MED ORDER — CEFAZOLIN SODIUM-DEXTROSE 2-4 GM/100ML-% IV SOLN
2.0000 g | INTRAVENOUS | Status: AC
  Administered 2017-01-31: 2 g via INTRAVENOUS

## 2017-01-31 MED ORDER — BACITRACIN ZINC 500 UNIT/GM EX OINT
TOPICAL_OINTMENT | CUTANEOUS | Status: DC | PRN
  Administered 2017-01-31: 1 via TOPICAL

## 2017-01-31 MED ORDER — DEXAMETHASONE SODIUM PHOSPHATE 4 MG/ML IJ SOLN
4.0000 mg | Freq: Four times a day (QID) | INTRAMUSCULAR | Status: AC
  Administered 2017-01-31 – 2017-02-01 (×4): 4 mg via INTRAVENOUS
  Filled 2017-01-31 (×5): qty 1

## 2017-01-31 MED ORDER — BACITRACIN ZINC 500 UNIT/GM EX OINT
TOPICAL_OINTMENT | CUTANEOUS | Status: AC
  Filled 2017-01-31: qty 28.35

## 2017-01-31 MED ORDER — CHLORHEXIDINE GLUCONATE CLOTH 2 % EX PADS: 6.0000 | MEDICATED_PAD | Freq: Once | CUTANEOUS | Status: DC

## 2017-01-31 MED ORDER — SODIUM CHLORIDE 0.9 % IR SOLN
Status: DC | PRN
  Administered 2017-01-31: 500 mL

## 2017-01-31 MED ORDER — HYDROCHLOROTHIAZIDE 25 MG PO TABS
25.0000 mg | ORAL_TABLET | Freq: Every day | ORAL | Status: DC
  Administered 2017-02-01: 25 mg via ORAL
  Filled 2017-01-31: qty 1

## 2017-01-31 MED ORDER — SUGAMMADEX SODIUM 200 MG/2ML IV SOLN
INTRAVENOUS | Status: AC
  Filled 2017-01-31: qty 2

## 2017-01-31 MED ORDER — HYDROXYZINE HCL 50 MG/ML IM SOLN
50.0000 mg | INTRAMUSCULAR | Status: DC | PRN
Start: 2017-01-31 — End: 2017-02-01
  Filled 2017-01-31: qty 1

## 2017-01-31 MED ORDER — SODIUM CHLORIDE 0.9% FLUSH
3.0000 mL | Freq: Two times a day (BID) | INTRAVENOUS | Status: DC
  Administered 2017-01-31 – 2017-02-01 (×3): 3 mL via INTRAVENOUS

## 2017-01-31 MED ORDER — HYDROMORPHONE HCL 1 MG/ML IJ SOLN
INTRAMUSCULAR | Status: AC
  Filled 2017-01-31: qty 0.5

## 2017-01-31 MED ORDER — CEFAZOLIN SODIUM-DEXTROSE 2-4 GM/100ML-% IV SOLN
INTRAVENOUS | Status: AC
  Filled 2017-01-31: qty 100

## 2017-01-31 MED ORDER — THROMBIN 5000 UNITS EX SOLR
OROMUCOSAL | Status: DC | PRN
  Administered 2017-01-31: 11:00:00 via TOPICAL

## 2017-01-31 MED ORDER — BISACODYL 10 MG RE SUPP: 10.0000 mg | Freq: Every day | RECTAL | Status: DC | PRN

## 2017-01-31 MED ORDER — MIDAZOLAM HCL 2 MG/2ML IJ SOLN: INTRAMUSCULAR | Status: DC | PRN

## 2017-01-31 MED ORDER — LIDOCAINE-EPINEPHRINE 2 %-1:100000 IJ SOLN
INTRAMUSCULAR | Status: AC
  Filled 2017-01-31: qty 1

## 2017-01-31 MED ORDER — PHENOL 1.4 % MT LIQD: 1.0000 | OROMUCOSAL | Status: DC | PRN

## 2017-01-31 MED ORDER — PRAVASTATIN SODIUM 20 MG PO TABS
20.0000 mg | ORAL_TABLET | Freq: Every day | ORAL | Status: DC
  Administered 2017-01-31: 20 mg via ORAL
  Filled 2017-01-31: qty 1

## 2017-01-31 MED ORDER — PROPOFOL 10 MG/ML IV BOLUS
INTRAVENOUS | Status: AC
  Filled 2017-01-31: qty 40

## 2017-01-31 MED ORDER — ACETAMINOPHEN 650 MG RE SUPP: 650.0000 mg | RECTAL | Status: DC | PRN

## 2017-01-31 MED ORDER — FENTANYL CITRATE (PF) 250 MCG/5ML IJ SOLN
INTRAMUSCULAR | Status: AC
  Filled 2017-01-31: qty 5

## 2017-01-31 MED ORDER — ONDANSETRON HCL 4 MG/2ML IJ SOLN
INTRAMUSCULAR | Status: AC
  Filled 2017-01-31: qty 2

## 2017-01-31 MED ORDER — LIDOCAINE-EPINEPHRINE 1 %-1:100000 IJ SOLN
INTRAMUSCULAR | Status: DC | PRN
  Administered 2017-01-31: 10 mL

## 2017-01-31 MED ORDER — BUPIVACAINE HCL (PF) 0.25 % IJ SOLN
INTRAMUSCULAR | Status: AC
  Filled 2017-01-31: qty 30

## 2017-01-31 MED ORDER — MORPHINE SULFATE (PF) 4 MG/ML IV SOLN: 4.0000 mg | INTRAVENOUS | Status: DC | PRN

## 2017-01-31 MED ORDER — ACETAMINOPHEN 325 MG PO TABS: 650.0000 mg | ORAL_TABLET | ORAL | Status: DC | PRN

## 2017-01-31 MED ORDER — DEXAMETHASONE SODIUM PHOSPHATE 10 MG/ML IJ SOLN
INTRAMUSCULAR | Status: DC | PRN
  Administered 2017-01-31: 10 mg via INTRAVENOUS

## 2017-01-31 MED ORDER — LIDOCAINE HCL (CARDIAC) 20 MG/ML IV SOLN
INTRAVENOUS | Status: DC | PRN
  Administered 2017-01-31: 30 mg via INTRAVENOUS

## 2017-01-31 MED ORDER — MIDAZOLAM HCL 2 MG/2ML IJ SOLN
INTRAMUSCULAR | Status: AC
  Filled 2017-01-31: qty 2

## 2017-01-31 MED ORDER — HYDROMORPHONE HCL 1 MG/ML IJ SOLN
0.2500 mg | INTRAMUSCULAR | Status: DC | PRN
  Administered 2017-01-31: 0.5 mg via INTRAVENOUS

## 2017-01-31 MED ORDER — KETOROLAC TROMETHAMINE 15 MG/ML IJ SOLN
INTRAMUSCULAR | Status: AC
  Filled 2017-01-31: qty 1

## 2017-01-31 MED ORDER — PROPOFOL 10 MG/ML IV BOLUS
INTRAVENOUS | Status: DC | PRN
  Administered 2017-01-31: 50 mg via INTRAVENOUS
  Administered 2017-01-31: 150 mg via INTRAVENOUS

## 2017-01-31 MED ORDER — ONDANSETRON HCL 4 MG/2ML IJ SOLN
INTRAMUSCULAR | Status: DC | PRN
  Administered 2017-01-31: 4 mg via INTRAVENOUS

## 2017-01-31 MED ORDER — DEXTROSE 5 % IV SOLN
INTRAVENOUS | Status: DC | PRN
  Administered 2017-01-31: 25 ug/min via INTRAVENOUS

## 2017-01-31 MED ORDER — THROMBIN 5000 UNITS EX SOLR
CUTANEOUS | Status: AC
  Filled 2017-01-31: qty 10000

## 2017-01-31 MED ORDER — ONDANSETRON HCL 4 MG/2ML IJ SOLN: 4.0000 mg | Freq: Four times a day (QID) | INTRAMUSCULAR | Status: DC | PRN

## 2017-01-31 MED ORDER — PROMETHAZINE HCL 25 MG/ML IJ SOLN: 6.2500 mg | INTRAMUSCULAR | Status: DC | PRN

## 2017-01-31 MED ORDER — LACTATED RINGERS IV SOLN
INTRAVENOUS | Status: DC | PRN
  Administered 2017-01-31 (×2): via INTRAVENOUS

## 2017-01-31 MED ORDER — PHENYLEPHRINE 40 MCG/ML (10ML) SYRINGE FOR IV PUSH (FOR BLOOD PRESSURE SUPPORT)
PREFILLED_SYRINGE | INTRAVENOUS | Status: AC
  Filled 2017-01-31: qty 10

## 2017-01-31 MED ORDER — KETOROLAC TROMETHAMINE 15 MG/ML IJ SOLN
15.0000 mg | Freq: Four times a day (QID) | INTRAMUSCULAR | Status: DC
  Administered 2017-01-31 – 2017-02-01 (×4): 15 mg via INTRAVENOUS
  Filled 2017-01-31 (×4): qty 1

## 2017-01-31 MED ORDER — EPHEDRINE 5 MG/ML INJ
INTRAVENOUS | Status: AC
  Filled 2017-01-31: qty 10

## 2017-01-31 MED ORDER — THROMBIN 20000 UNITS EX SOLR
CUTANEOUS | Status: AC
  Filled 2017-01-31: qty 20000

## 2017-01-31 MED ORDER — KCL IN DEXTROSE-NACL 40-5-0.45 MEQ/L-%-% IV SOLN
INTRAVENOUS | Status: DC
  Filled 2017-01-31: qty 1000

## 2017-01-31 MED ORDER — ROCURONIUM BROMIDE 100 MG/10ML IV SOLN
INTRAVENOUS | Status: DC | PRN
  Administered 2017-01-31: 30 mg via INTRAVENOUS
  Administered 2017-01-31 (×2): 10 mg via INTRAVENOUS
  Administered 2017-01-31: 20 mg via INTRAVENOUS

## 2017-01-31 MED ORDER — BUPIVACAINE HCL (PF) 0.25 % IJ SOLN
INTRAMUSCULAR | Status: DC | PRN
  Administered 2017-01-31: 10 mL

## 2017-01-31 MED ORDER — ROCURONIUM BROMIDE 50 MG/5ML IV SOSY
PREFILLED_SYRINGE | INTRAVENOUS | Status: AC
  Filled 2017-01-31: qty 10

## 2017-01-31 MED ORDER — SODIUM CHLORIDE 0.9% FLUSH: 3.0000 mL | INTRAVENOUS | Status: DC | PRN

## 2017-01-31 MED ORDER — THROMBIN 5000 UNITS EX SOLR
CUTANEOUS | Status: AC
  Filled 2017-01-31: qty 5000

## 2017-01-31 SURGICAL SUPPLY — 68 items
ADH SKN CLS APL DERMABOND .7 (GAUZE/BANDAGES/DRESSINGS) ×1
APL SKNCLS STERI-STRIP NONHPOA (GAUZE/BANDAGES/DRESSINGS)
BAG DECANTER FOR FLEXI CONT (MISCELLANEOUS) ×2 IMPLANT
BENZOIN TINCTURE PRP APPL 2/3 (GAUZE/BANDAGES/DRESSINGS) IMPLANT
BLADE SURG 11 STRL SS (BLADE) ×2 IMPLANT
BLADE ULTRA TIP 2M (BLADE) IMPLANT
BUR ACRON 5.0MM COATED (BURR) ×1 IMPLANT
BUR MATCHSTICK NEURO 3.0 LAGG (BURR) ×1 IMPLANT
CANISTER SUCT 3000ML PPV (MISCELLANEOUS) ×2 IMPLANT
CARTRIDGE OIL MAESTRO DRILL (MISCELLANEOUS) ×1 IMPLANT
CATH FOLEY 2W COUNCIL 5CC 16FR (CATHETERS) ×1 IMPLANT
CLIP TI MEDIUM 6 (CLIP) IMPLANT
COVER MAYO STAND STRL (DRAPES) ×1 IMPLANT
DERMABOND ADVANCED (GAUZE/BANDAGES/DRESSINGS) ×1
DERMABOND ADVANCED .7 DNX12 (GAUZE/BANDAGES/DRESSINGS) IMPLANT
DIFFUSER DRILL AIR PNEUMATIC (MISCELLANEOUS) ×2 IMPLANT
DRAPE LAPAROTOMY 100X72 PEDS (DRAPES) IMPLANT
DRAPE LAPAROTOMY 100X72X124 (DRAPES) IMPLANT
DRAPE MICROSCOPE LEICA (MISCELLANEOUS) ×1 IMPLANT
DRAPE POUCH INSTRU U-SHP 10X18 (DRAPES) ×2 IMPLANT
ELECT REM PT RETURN 9FT ADLT (ELECTROSURGICAL) ×2
ELECTRODE REM PT RTRN 9FT ADLT (ELECTROSURGICAL) ×1 IMPLANT
GAUZE SPONGE 4X4 12PLY STRL (GAUZE/BANDAGES/DRESSINGS) IMPLANT
GAUZE SPONGE 4X4 12PLY STRL LF (GAUZE/BANDAGES/DRESSINGS) ×1 IMPLANT
GAUZE SPONGE 4X4 16PLY XRAY LF (GAUZE/BANDAGES/DRESSINGS) ×1 IMPLANT
GLOVE BIOGEL PI IND STRL 6.5 (GLOVE) IMPLANT
GLOVE BIOGEL PI IND STRL 8 (GLOVE) ×1 IMPLANT
GLOVE BIOGEL PI INDICATOR 6.5 (GLOVE) ×1
GLOVE BIOGEL PI INDICATOR 8 (GLOVE) ×1
GLOVE ECLIPSE 6.5 STRL STRAW (GLOVE) ×1 IMPLANT
GLOVE ECLIPSE 7.5 STRL STRAW (GLOVE) ×2 IMPLANT
GLOVE SURG SS PI 6.5 STRL IVOR (GLOVE) ×3 IMPLANT
GOWN STRL REUS W/ TWL LRG LVL3 (GOWN DISPOSABLE) IMPLANT
GOWN STRL REUS W/ TWL XL LVL3 (GOWN DISPOSABLE) IMPLANT
GOWN STRL REUS W/TWL 2XL LVL3 (GOWN DISPOSABLE) IMPLANT
GOWN STRL REUS W/TWL LRG LVL3 (GOWN DISPOSABLE) ×4
GOWN STRL REUS W/TWL XL LVL3 (GOWN DISPOSABLE) ×2
HEMOSTAT POWDER KIT SURGIFOAM (HEMOSTASIS) ×1 IMPLANT
HEMOSTAT SURGICEL 2X14 (HEMOSTASIS) IMPLANT
KIT BASIN OR (CUSTOM PROCEDURE TRAY) ×2 IMPLANT
KIT ROOM TURNOVER OR (KITS) ×2 IMPLANT
NDL SPNL 18GX3.5 QUINCKE PK (NEEDLE) ×2 IMPLANT
NDL SPNL 22GX3.5 QUINCKE BK (NEEDLE) ×1 IMPLANT
NEEDLE SPNL 18GX3.5 QUINCKE PK (NEEDLE) ×4 IMPLANT
NEEDLE SPNL 22GX3.5 QUINCKE BK (NEEDLE) ×2 IMPLANT
NS IRRIG 1000ML POUR BTL (IV SOLUTION) ×2 IMPLANT
OIL CARTRIDGE MAESTRO DRILL (MISCELLANEOUS) ×2
PACK LAMINECTOMY NEURO (CUSTOM PROCEDURE TRAY) ×2 IMPLANT
PAD ARMBOARD 7.5X6 YLW CONV (MISCELLANEOUS) ×6 IMPLANT
PATTIES SURGICAL .25X.25 (GAUZE/BANDAGES/DRESSINGS) IMPLANT
PATTIES SURGICAL .5 X3 (DISPOSABLE) ×3 IMPLANT
PATTIES SURGICAL 1/4 X 3 (GAUZE/BANDAGES/DRESSINGS) ×3 IMPLANT
RUBBERBAND STERILE (MISCELLANEOUS) ×2 IMPLANT
SPECIMEN JAR SMALL (MISCELLANEOUS) IMPLANT
SPONGE LAP 4X18 X RAY DECT (DISPOSABLE) ×1 IMPLANT
SPONGE NEURO XRAY DETECT 1X3 (DISPOSABLE) IMPLANT
SPONGE SURGIFOAM ABS GEL 100 (HEMOSTASIS) ×2 IMPLANT
STAPLER SKIN PROX WIDE 3.9 (STAPLE) ×1 IMPLANT
STRIP CLOSURE SKIN 1/4X4 (GAUZE/BANDAGES/DRESSINGS) IMPLANT
SUT PROLENE 6 0 BV (SUTURE) ×6 IMPLANT
SUT VIC AB 0 CT1 18XCR BRD8 (SUTURE) ×1 IMPLANT
SUT VIC AB 0 CT1 8-18 (SUTURE) ×6
SUT VIC AB 2-0 CP2 18 (SUTURE) ×4 IMPLANT
TAPE CLOTH SURG 4X10 WHT LF (GAUZE/BANDAGES/DRESSINGS) ×1 IMPLANT
TOWEL GREEN STERILE (TOWEL DISPOSABLE) ×2 IMPLANT
TOWEL GREEN STERILE FF (TOWEL DISPOSABLE) ×2 IMPLANT
TRAY FOLEY W/METER SILVER 16FR (SET/KITS/TRAYS/PACK) ×1 IMPLANT
WATER STERILE IRR 1000ML POUR (IV SOLUTION) ×2 IMPLANT

## 2017-01-31 NOTE — H&P (Signed)
Subjective: Patient is a 66 y.o. right-handed white female who is admitted for treatment of thoracic arachnoid cyst.  Patient's difficulties began about 3 months ago, with pain around the left scapula and down through the left shoulder, arm, and forearm. Patient was found to have weakness in the left upper extremity as well as in the proximal left lower extremity, and was studied with MRI scans of the cervical and thoracic spine. The cervical study revealed cervical degenerative disc disease and spondylosis with osteophytic neuroforaminal encroachment bilaterally at C5-6 and C6-7.  At C5-C6 there is symmetrical encroachment, and at C6-7 encroachment is worse on the left than the right.  The thoracic study revealed a dorsal arachnoid cyst with spinal cord compression, most prominently seen at the T2-3 level. There is increased signal of spinal cord rostral to the area of compression. Is difficult to clearly identify the caudal end of the arachnoid cyst. Patient is admitted now for thoracic laminectomy and marsupialization of the thoracic arachnoid cyst.   Patient Active Problem List   Diagnosis Date Noted  . Well woman exam 01/11/2017  . Allergic reaction 11/12/2013  . Essential hypertension 12/06/2010  . MIGRAINE, CHRONIC 11/24/2010  . Vitamin D deficiency 04/08/2010  . DEPRESSION, MILD 03/09/2010  . RENAL CYST 12/22/2009  . OBESITY 08/11/2008  . NEOPLASM, MALIGNANT, SKIN, FACE 12/01/2007  . PATENT FORAMEN OVALE 01/28/2005  . HLD (hyperlipidemia) 03/30/2001   Past Medical History:  Diagnosis Date  . Abdominal pain, right lower quadrant   . Acquired cyst of kidney 01/08/2012   pt denies history of   . Acute sinusitis, unspecified   . Adenomatous colon polyp   . Angiomyolipoma    kidney  . Chest pain, unspecified   . Depressive disorder, not elsewhere classified 01/08/2012   not currently   . Diverticulosis   . Dizziness and giddiness   . Dysfunction of eustachian tube   . Hemorrhoids    . History of diverticulitis of colon   . IBS (irritable bowel syndrome)   . Liver lesion 2011  . Neoplasm of skin of face    malignant  . OA (osteoarthritis)    knee  . Obesity, unspecified   . Ostium secundum type atrial septal defect   . Other and unspecified hyperlipidemia   . Other malaise and fatigue   . Palpitations   . Shortness of breath   . Unspecified essential hypertension    pt states she does not have HTN  . Unspecified sinusitis (chronic)   . Unspecified vitamin D deficiency     Past Surgical History:  Procedure Laterality Date  . ABDOMINAL HYSTERECTOMY    . BREAST REDUCTION SURGERY  1990  . CHOLECYSTECTOMY    . COLONOSCOPY    . DILATION AND CURETTAGE OF UTERUS    . FLEXIBLE SIGMOIDOSCOPY  01/08/2012   Procedure: FLEXIBLE SIGMOIDOSCOPY;  Surgeon: Lafayette Dragon, MD;  Location: [facility];  Service: Endoscopy;  Laterality: N/A;  . HEMORRHOID SURGERY    . KNEE ARTHROTOMY     pt states no   . LEFT OOPHORECTOMY  1976  . PARTIAL HYSTERECTOMY     right ovary remains  . TONSILLECTOMY  1972    Prescriptions Prior to Admission  Medication Sig Dispense Refill Last Dose  . Cholecalciferol (VITAMIN D3) 2000 units TABS Take 2,000 Units by mouth daily.   01/24/2017  . dextromethorphan-guaiFENesin (MUCINEX DM) 30-600 MG 12hr tablet Take 1 tablet by mouth 2 (two) times daily as needed for cough.   Past  Week at Unknown time  . fluticasone (FLONASE) 50 MCG/ACT nasal spray Place 1 spray into both nostrils daily.   Past Week at Unknown time  . gabapentin (NEURONTIN) 300 MG capsule 1 by mouth at night for 7 days then 1 by mouth twice per day for 7 days then 1 by mouth three times per day. (Patient taking differently: Take 600 mg by mouth at bedtime. ) 180 capsule 1 Past Week at Unknown time  . hydrochlorothiazide (HYDRODIURIL) 25 MG tablet TAKE 1 TABLET (25 MG TOTAL) BY MOUTH AT BEDTIME. (Patient taking differently: TAKE 1 TABLET (25 MG TOTAL) BY MOUTH IN THE MORNING.) 90 tablet 2  Past Week at Unknown time  . HYDROcodone-acetaminophen (NORCO) 5-325 MG tablet Take 1 tablet by mouth every 6 (six) hours as needed. (Patient taking differently: Take 1 tablet by mouth every 6 (six) hours as needed. For pain.) 40 tablet 0 Past Month at Unknown time  . pravastatin (PRAVACHOL) 20 MG tablet Take 1 tablet (20 mg total) by mouth daily. (Patient taking differently: Take 20 mg by mouth at bedtime. ) 90 tablet 3 Past Week at Unknown time  . tiZANidine (ZANAFLEX) 4 MG tablet Take 1 tablet (4 mg total) by mouth every 6 (six) hours as needed for muscle spasms. (Patient taking differently: Take 4 mg by mouth every 6 (six) hours as needed for muscle spasms. For muscle spasms.) 30 tablet 3 Past Month at Unknown time   Allergies  Allergen Reactions  . Simvastatin Other (See Comments)    MYALGIAS  . Bactrim [Sulfamethoxazole-Trimethoprim] Itching    Social History  Substance Use Topics  . 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 Smokeless tobacco: Never Used  . Alcohol use No    Family History  Problem Relation Age of Onset  . Cirrhosis Father   . Heart disease Father   . Hypertension Mother   . Diabetes Mother   . Breast cancer Cousin   . Hypertension Sister   . Diabetes Cousin   . Diabetes      aunt  . Stroke      aunt  . Heart disease Brother     x 2  . Colon cancer Neg Hx      Review of Systems A comprehensive review of systems was negative.  Objective: Vital signs in last 24 hours: Temp:  [98.7 F (37.1 C)] 98.7 F (37.1 C) (04/04 0656) Pulse Rate:  [80] 80 (04/04 0656) Resp:  [20] 20 (04/04 0656) BP: (143-161)/(69-83) 143/69 (04/04 0659) SpO2:  [96 %] 96 % (04/04 0656) Weight:  [81.2 kg (179 lb)] 81.2 kg (179 lb) (04/04 0656)  EXAM: She is well-developed well-nourished white female in no acute distress. Lungs are clear to auscultation , the patient has symmetrical respiratory excursion. Heart has a regular rate and rhythm normal S1 and S2 no murmur.   Abdomen is soft  nontender nondistended bowel sounds are present. Extremity examination shows no clubbing cyanosis or edema. Motor examination shows 5/5 strength to the right upper and lower extremity including the right deltoid, biceps, triceps, intrinsics, grip, iliopsoas, quadriceps, dorsiflexor, extensor hallicus longus, and plantar flexor. However motor examination the left upper extremities shows the deltoid is 5, the biceps is 4, the triceps is 4+, the intrinsics are 4 minus to 4, the grip is 4, and the iliopsoas is 4. Left quadriceps, dorsi flexor, eccentric hallicus longus, and plantar flexor 5. Sensation is intact to pinprick the distal upper extremities. Reflex examination shows the left biceps brachioradialis triceps  quadriceps and gastrocnemius are trace. The right biceps, brachioradialis, triceps, quadriceps, and gastriocnemius are minimal to absent. Toes are downgoing bilaterally. She has a normal gait and stance.  Data Review:CBC    Component Value Date/Time   WBC 6.6 01/23/2017 0832   RBC 4.48 01/23/2017 0832   HGB 13.7 01/23/2017 0832   HCT 41.5 01/23/2017 0832   PLT 383 01/23/2017 0832   MCV 92.6 01/23/2017 0832   MCH 30.6 01/23/2017 0832   MCHC 33.0 01/23/2017 0832   RDW 12.4 01/23/2017 0832   LYMPHSABS 2.2 01/01/2017 1135   MONOABS 0.7 01/01/2017 1135   EOSABS 0.2 01/01/2017 1135   BASOSABS 0.0 01/01/2017 1135                          BMET    Component Value Date/Time   NA 141 01/23/2017 0832   K 3.4 (L) 01/23/2017 0832   CL 103 01/23/2017 0832   CO2 30 01/23/2017 0832   GLUCOSE 99 01/23/2017 0832   BUN 14 01/23/2017 0832   CREATININE 0.74 01/23/2017 0832   CALCIUM 9.6 01/23/2017 0832   GFRNONAA >60 01/23/2017 0832   GFRAA >60 01/23/2017 7628     Assessment/Plan: Patient with left parascapular pain and left cervical radicular pain with degenerative changes in the cervical spine at C5-6 and C6-7, but with a large dorsal upper thoracic arachnoid cyst with spinal cord  compression and increased signal in the upper thoracic spinal cord is admitted now for thoracic laminectomy and marsupialization of the arachnoid cyst.  Patient understands that she may ultimately require cervical spine surgery as well. I discussed nature of surgery, typical of surgery, hospitalization, and recuperation. We have discussed risks of surgery including risk of infection, bleeding, possibly for transfusion, neurologic deficit including paralysis and paraplegia, cerebrospinal fluid leakage and possibly further surgery, and anesthetic risks of myocardial function, stroke, pneumonia, and death. Understanding this, the patient does wish to proceed with surgery and is admitted for such.   Hosie Spangle, MD 01/31/2017 8:03 AM

## 2017-01-31 NOTE — Anesthesia Procedure Notes (Addendum)
Procedure Name: Intubation Performed by: Eligha Bridegroom Pre-anesthesia Checklist: Patient identified, Emergency Drugs available, Suction available and Patient being monitored Patient Re-evaluated:Patient Re-evaluated prior to inductionOxygen Delivery Method: Circle system utilized Preoxygenation: Pre-oxygenation with 100% oxygen Intubation Type: IV induction Ventilation: Mask ventilation without difficulty and Oral airway inserted - appropriate to patient size Laryngoscope Size: Mac and 3 Grade View: Grade II Tube type: Oral Tube size: 7.0 mm Number of attempts: 1 Airway Equipment and Method: Stylet Placement Confirmation: ETT inserted through vocal cords under direct vision and breath sounds checked- equal and bilateral Secured at: 21 cm Tube secured with: Tape Dental Injury: Teeth and Oropharynx as per pre-operative assessment and Injury to tongue

## 2017-01-31 NOTE — Anesthesia Preprocedure Evaluation (Addendum)
Anesthesia Evaluation  Patient identified by MRN, date of birth, ID band Patient awake    Reviewed: Allergy & Precautions, NPO status , Unable to perform ROS - Chart review only  Airway Mallampati: II  TM Distance: >3 FB Neck ROM: Full    Dental  (+) Teeth Intact, Dental Advisory Given   Pulmonary shortness of breath,    breath sounds clear to auscultation       Cardiovascular hypertension, negative cardio ROS   Rhythm:Regular Rate:Normal     Neuro/Psych    GI/Hepatic negative GI ROS, Neg liver ROS,   Endo/Other    Renal/GU      Musculoskeletal  (+) Arthritis ,   Abdominal   Peds  Hematology negative hematology ROS (+)   Anesthesia Other Findings   Reproductive/Obstetrics                            Anesthesia Physical Anesthesia Plan  ASA: II  Anesthesia Plan: General   Post-op Pain Management:    Induction: Intravenous  Airway Management Planned: Oral ETT  Additional Equipment:   Intra-op Plan:   Post-operative Plan: Extubation in OR  Informed Consent: I have reviewed the patients History and Physical, chart, labs and discussed the procedure including the risks, benefits and alternatives for the proposed anesthesia with the patient or authorized representative who has indicated his/her understanding and acceptance.     Plan Discussed with: CRNA  Anesthesia Plan Comments:         Anesthesia Quick Evaluation

## 2017-01-31 NOTE — Op Note (Signed)
01/31/2017  11:55 AM  PATIENT:  Nicole Daniels  66 y.o. female  PRE-OPERATIVE DIAGNOSIS:  Thoracic arachnoid cyst with myelopathy  POST-OPERATIVE DIAGNOSIS:  Thoracic arachnoid cyst with myelopathy  PROCEDURE:  Procedure(s):  Thoracic Laminectomy, intradural exploration, and marsupialization of arachnoid cyst, with microdissection, microsurgical technique, and the operating microscope  SURGEON:  Surgeon(s): Jovita Gamma, MD Ashok Pall, MD  ASSISTANTS: Ashok Pall, M.D.  ANESTHESIA:   general  EBL:  Total I/O In: 1900 [I.V.:1900] Out: 390 [Urine:265; Blood:125]  BLOOD ADMINISTERED:none  COUNT: Correct per nursing staff  DICTATION: Patient was brought to the operating room, placed under general endotracheal anesthesia. The radiolucent 3 pin Mayfield head holder was applied, the patient was turned to a prone position. The posterior cervical and thoracic region was prepped with Betadine soap and solution and draped in a sterile fashion. C-arm fluoroscopy was used for localization, and the T2 and T3 levels were identified. The overlying skin and subcutaneous tissue were infiltrated with local anesthetic with epinephrine. Midline upper thoracic incision was made, and carried down to the subcutaneous tissue. Bipolar cautery and electrocautery were used to maintain hemostasis. Dissection was carried down to the thoracic fascia, there was dissected from the spinous processes and lamina in a subperiosteal fashion. Suctioning retractors were placed. Additional C-arm imaging was used to confirm localization at the T2 and T3 levels. The operating microscope was draped and brought in the field provided medication, illumination, and visualization, and the remainder the surgical procedure was performed using microdissection microsurgical technique. A T2 and T3 laminectomy was performed using double-action rongeurs, the high-speed drill, and Kerrison punches. The dura was identified, and noted  to be pulsating nicely. The dura was opened from caudal to rostral, and the thoracic arachnoid cyst was identified along with the adhesions at the rostral end of the cyst. The cyst was marsupialized by opening the arachnoid of the cyst as well as of the adhesions at the cyst's rostral end, and the arachnoid rostral to the arachnoid cyst. Good communication between the cyst and the subarachnoid space was achieved, and we continued to see good CSF pulsations throughout the procedure. The spinal cord was noted to be flattened in appearance at the level of the rostral end of the arachnoid cyst, consistent with the MRI findings. We then proceeded with closure. The dura was closed with a running 6-0 Prolene suture. Surgifoam was used in the margins of the laminectomy to establish hemostasis in the epidural space, which was confirmed. Paraspinal musculature was passed with interrupted undyed 0 Vicryl sutures. Deep fascia was closed with interrupted undyed 0 Vicryl sutures. Scarpa's fascia was closed with interrupted undyed 0 Vicryl sutures. The subcutaneous and subcuticular layer closed with interrupted inverted 2-0 Vicryl sutures. The skin edges approximated with Dermabond. The wound was dressed with sterile gauze and Hypafix. Following surgery the patient was turned back to a supine position, the 3 pin Mayfield head holder was removed, and the patient is to be reversed the anesthetic, extubated, and transferred to the recovery room for further care.  PLAN OF CARE: Admit to inpatient   PATIENT DISPOSITION:  PACU - hemodynamically stable.   Delay start of Pharmacological VTE agent (>24hrs) due to surgical blood loss or risk of bleeding:  yes

## 2017-01-31 NOTE — Transfer of Care (Signed)
Immediate Anesthesia Transfer of Care Note  Patient: Nicole Daniels  Procedure(s) Performed: Procedure(s): Thoracic Laminectomy and marsupialization of arachnoid cyst (N/A)  Patient Location: PACU  Anesthesia Type:General  Level of Consciousness: awake and alert   Airway & Oxygen Therapy: Patient Spontanous Breathing and Patient connected to nasal cannula oxygen  Post-op Assessment: Report given to RN and Post -op Vital signs reviewed and stable  Post vital signs: Reviewed and stable  Last Vitals:  Vitals:   01/31/17 0659 01/31/17 1205  BP: (!) 143/69 (!) 155/76  Pulse:  92  Resp:  12  Temp:  36.3 C    Last Pain:  Vitals:   01/31/17 1205  TempSrc:   PainSc: 0-No pain         Complications: No apparent anesthesia complications

## 2017-01-31 NOTE — Anesthesia Postprocedure Evaluation (Addendum)
Anesthesia Post Note  Patient: Nicole Daniels  Procedure(s) Performed: Procedure(s) (LRB): Thoracic Laminectomy and marsupialization of arachnoid cyst (N/A)  Patient location during evaluation: PACU Anesthesia Type: General Level of consciousness: awake, sedated and oriented Pain management: pain level controlled Vital Signs Assessment: post-procedure vital signs reviewed and stable Respiratory status: spontaneous breathing, nonlabored ventilation, respiratory function stable and patient connected to nasal cannula oxygen Cardiovascular status: blood pressure returned to baseline and stable Postop Assessment: no signs of nausea or vomiting Anesthetic complications: no       Last Vitals:  Vitals:   01/31/17 1230 01/31/17 1245  BP: 133/68 122/67  Pulse: 83 78  Resp: 18 19  Temp:      Last Pain:  Vitals:   01/31/17 1245  TempSrc:   PainSc: 7                  Yarelin Reichardt,JAMES TERRILL

## 2017-01-31 NOTE — Progress Notes (Signed)
Arrived to pacu with iv infusing and saline lock from or

## 2017-01-31 NOTE — Anesthesia Procedure Notes (Signed)
Performed by: Tayte Mcwherter       

## 2017-01-31 NOTE — Plan of Care (Signed)
Problem: Activity: Goal: Ability to avoid complications of mobility impairment will improve Outcome: Progressing Safely  ambulated  around  unit

## 2017-01-31 NOTE — Progress Notes (Signed)
Vitals:   01/31/17 1345 01/31/17 1400 01/31/17 1430 01/31/17 1600  BP: (!) 103/54 (!) 106/56 117/63   Pulse: 74 71    Resp: 14 13 (!) 24   Temp:  98.1 F (36.7 C)  98.1 F (36.7 C)  TempSrc:    Oral  SpO2: 96% 95% 94%   Weight:        Patient sitting up in bed, nibbling on her dinner. Has ambulated around the ICU once with her nurse, but did vomit a small amount of bile. Declined an antiemitic. Foley DC'd, has not yet voided, nursing staff to monitor voiding function. Moving all 4 extremities. Dressing clean and dry.  Plan: Encouraged to ambulate 1 or 2 more times this evening, and 2 or 3 more times tomorrow morning. We'll continue to progress through postoperative recovery.  Hosie Spangle, MD 01/31/2017, 5:38 PM

## 2017-02-01 ENCOUNTER — Encounter (HOSPITAL_COMMUNITY): Payer: Self-pay | Admitting: Neurosurgery

## 2017-02-01 MED ORDER — HYDROCODONE-ACETAMINOPHEN 5-325 MG PO TABS: 1.0000 | ORAL_TABLET | ORAL | 0 refills | Status: DC | PRN

## 2017-02-01 NOTE — Progress Notes (Signed)
Visited with patient to assist with changing Advance Directives.  Patient indicated that she had a former AD but due to loses of family this was reason to update.  Three copies were made, the original was given to patient and two for her agents.  Patient was very pleasant and excited about going home.  Barbaraann Rondo, son at bedside.   02/01/17 1003  Clinical Encounter Type  Visited With Patient and family together  Visit Type Initial;Spiritual support ([place] Consult)  Referral From Nurse  Spiritual Encounters  Spiritual Needs Literature;Emotional  Stress Factors  Patient Stress Factors None identified  Family Stress Factors None identified  Advance Directives (For Healthcare)  Does Patient Have a Medical Advance Directive? Yes  Does patient want to make changes to medical advance directive? Yes (Inpatient - patient requests chaplain consult to change a medical advance directive)  Type of Advance Directive [place];Living will  Copy of [place] in Chart? Yes  Copy of Living Will in Chart? Yes  Jaclynn Major, [place]

## 2017-02-01 NOTE — Progress Notes (Signed)
Vitals:   02/01/17 0400 02/01/17 0500 02/01/17 0600 02/01/17 0700  BP: (!) 116/55 (!) 94/53 (!) 106/54 (!) 102/53  Pulse:      Resp: 18 17 18 16   Temp: 98.6 F (37 C)     TempSrc: Oral     SpO2: 90% 90% 92%   Weight:        Patient resting comfortably in bed. Ambulated once overnight. Dressing clean and dry. Voiding well. Low PVR.  Plan: Encouraged to ambulate this morning in the ICU, and sit up at the bedside for breakfast. We'll reassess around mid-day.  Hosie Spangle, MD 02/01/2017, 7:53 AM

## 2017-02-01 NOTE — Discharge Summary (Signed)
Physician Discharge Summary  Patient ID: Nicole Daniels MRN: [id number] DOB/AGE: 11-27-1950 66 y.o.  Admit date: 01/31/2017 Discharge date: 02/01/2017  Admission Diagnoses:  Thoracic arachnoid cyst with myelopathy  Discharge Diagnoses:  Thoracic arachnoid cyst with myelopathy Active Problems:   Intradural arachnoid cyst of spine   Discharged Condition: good  Hospital Course: She was admitted, underwent a thoracic laminectomy, intradural exploration, and marsupialization of a thoracic arachnoid cyst. Postoperatively she was monitored in the ICU. She has done well. She is anxious to be discharged to home. We have given her instructions regarding wound care and activities following discharge. She is scheduled to follow-up with me in the office in 3 weeks.  Discharge Exam: Blood pressure 129/69, pulse 94, temperature 97.9 F (36.6 C), temperature source Oral, resp. rate 20, weight 83.1 kg (183 lb 3.2 oz), SpO2 94 %.  Disposition: 01-Home or Self Care  Discharge Instructions    Discharge wound care:    Complete by:  As directed    Leave the wound open to air. Shower daily with the wound uncovered. Water and soapy water should run over the incision area. Do not wash directly on the incision for 2 weeks. Remove the glue after 2 weeks.   Driving Restrictions    Complete by:  As directed    No driving for 2 weeks. May ride in the car locally now. May begin to drive locally in 2 weeks.   Other Restrictions    Complete by:  As directed    Walk gradually increasing distances out in the fresh air at least twice a day. Walking additional 6 times inside the house, gradually increasing distances, daily. No bending, lifting, or twisting. Perform activities between shoulder and waist height (that is at counter height when standing or table height when sitting).     Allergies as of 02/01/2017      Reactions   Simvastatin Other (See Comments)   MYALGIAS   Bactrim [sulfamethoxazole-trimethoprim]  Itching      Medication List    TAKE these medications   dextromethorphan-guaiFENesin 30-600 MG 12hr tablet Commonly known as:  MUCINEX DM Take 1 tablet by mouth 2 (two) times daily as needed for cough.   fluticasone 50 MCG/ACT nasal spray Commonly known as:  FLONASE Place 1 spray into both nostrils daily.   gabapentin 300 MG capsule Commonly known as:  NEURONTIN 1 by mouth at night for 7 days then 1 by mouth twice per day for 7 days then 1 by mouth three times per day. What changed:  how much to take  how to take this  when to take this  additional instructions   hydrochlorothiazide 25 MG tablet Commonly known as:  HYDRODIURIL TAKE 1 TABLET (25 MG TOTAL) BY MOUTH AT BEDTIME. What changed:  See the new instructions.   HYDROcodone-acetaminophen 5-325 MG tablet Commonly known as:  NORCO Take 1 tablet by mouth every 6 (six) hours as needed. What changed:  additional instructions   HYDROcodone-acetaminophen 5-325 MG tablet Commonly known as:  NORCO/VICODIN Take 1-2 tablets by mouth every 4 (four) hours as needed (pain). What changed:  You were already taking a medication with the same name, and this prescription was added. Make sure you understand how and when to take each.   pravastatin 20 MG tablet Commonly known as:  PRAVACHOL Take 1 tablet (20 mg total) by mouth daily. What changed:  when to take this   tiZANidine 4 MG tablet Commonly known as:  ZANAFLEX Take 1 tablet (  4 mg total) by mouth every 6 (six) hours as needed for muscle spasms. What changed:  additional instructions   Vitamin D3 2000 units Tabs Take 2,000 Units by mouth daily.        SignedHosie Spangle 02/01/2017, 1:01 PM

## 2017-02-20 DIAGNOSIS — Z9889 Other specified postprocedural states: Secondary | ICD-10-CM

## 2017-02-20 HISTORY — DX: Other specified postprocedural states: Z98.890

## 2017-02-21 ENCOUNTER — Encounter (HOSPITAL_COMMUNITY): Payer: Self-pay | Admitting: Neurosurgery

## 2017-02-21 NOTE — Addendum Note (Signed)
Addendum  created 02/21/17 5830 by Suzy Bouchard, CRNA   Anesthesia Event edited, Anesthesia Staff edited

## 2017-03-30 NOTE — Addendum Note (Signed)
Addendum  created 03/30/17 1321 by Hawken Bielby, MD   Sign clinical note    

## 2017-05-14 ENCOUNTER — Encounter (HOSPITAL_COMMUNITY): Payer: Self-pay | Admitting: *Deleted

## 2017-05-14 ENCOUNTER — Observation Stay (HOSPITAL_COMMUNITY)
Admission: EM | Admit: 2017-05-14 | Discharge: 2017-05-15 | Disposition: A | Discharge status: Home / Self Care | Payer: Medicare Other | Attending: Internal Medicine | Admitting: Internal Medicine

## 2017-05-14 ENCOUNTER — Emergency Department (HOSPITAL_COMMUNITY): Payer: Medicare Other

## 2017-05-14 ENCOUNTER — Other Ambulatory Visit: Payer: Self-pay

## 2017-05-14 DIAGNOSIS — F329 Major depressive disorder, single episode, unspecified: Secondary | ICD-10-CM | POA: Diagnosis not present

## 2017-05-14 DIAGNOSIS — Z634 Disappearance and death of family member: Secondary | ICD-10-CM | POA: Insufficient documentation

## 2017-05-14 DIAGNOSIS — E876 Hypokalemia: Secondary | ICD-10-CM | POA: Diagnosis present

## 2017-05-14 DIAGNOSIS — R0789 Other chest pain: Secondary | ICD-10-CM | POA: Diagnosis not present

## 2017-05-14 DIAGNOSIS — K589 Irritable bowel syndrome without diarrhea: Secondary | ICD-10-CM | POA: Insufficient documentation

## 2017-05-14 DIAGNOSIS — I10 Essential (primary) hypertension: Secondary | ICD-10-CM | POA: Diagnosis present

## 2017-05-14 DIAGNOSIS — E782 Mixed hyperlipidemia: Secondary | ICD-10-CM | POA: Diagnosis present

## 2017-05-14 DIAGNOSIS — E66811 Obesity, class 1: Secondary | ICD-10-CM | POA: Diagnosis present

## 2017-05-14 DIAGNOSIS — Z6830 Body mass index (BMI) 30.0-30.9, adult: Secondary | ICD-10-CM | POA: Diagnosis not present

## 2017-05-14 DIAGNOSIS — F419 Anxiety disorder, unspecified: Secondary | ICD-10-CM | POA: Diagnosis not present

## 2017-05-14 DIAGNOSIS — G9619 Other disorders of meninges, not elsewhere classified: Secondary | ICD-10-CM

## 2017-05-14 DIAGNOSIS — I700 Atherosclerosis of aorta: Secondary | ICD-10-CM | POA: Insufficient documentation

## 2017-05-14 DIAGNOSIS — Z79899 Other long term (current) drug therapy: Secondary | ICD-10-CM | POA: Diagnosis not present

## 2017-05-14 DIAGNOSIS — G96198 Other disorders of meninges, not elsewhere classified: Secondary | ICD-10-CM | POA: Diagnosis present

## 2017-05-14 DIAGNOSIS — R079 Chest pain, unspecified: Secondary | ICD-10-CM | POA: Diagnosis present

## 2017-05-14 DIAGNOSIS — Z882 Allergy status to sulfonamides status: Secondary | ICD-10-CM | POA: Diagnosis not present

## 2017-05-14 DIAGNOSIS — Z888 Allergy status to other drugs, medicaments and biological substances status: Secondary | ICD-10-CM | POA: Diagnosis not present

## 2017-05-14 DIAGNOSIS — M1710 Unilateral primary osteoarthritis, unspecified knee: Secondary | ICD-10-CM | POA: Insufficient documentation

## 2017-05-14 DIAGNOSIS — E559 Vitamin D deficiency, unspecified: Secondary | ICD-10-CM | POA: Diagnosis present

## 2017-05-14 DIAGNOSIS — E785 Hyperlipidemia, unspecified: Secondary | ICD-10-CM | POA: Insufficient documentation

## 2017-05-14 DIAGNOSIS — E669 Obesity, unspecified: Secondary | ICD-10-CM | POA: Diagnosis not present

## 2017-05-14 HISTORY — DX: Chest pain, unspecified: R07.9

## 2017-05-14 LAB — CBC WITH DIFFERENTIAL/PLATELET
BASOS ABS: 0 10*3/uL (ref 0.0–0.1)
BASOS PCT: 0 %
Eosinophils Absolute: 0.2 10*3/uL (ref 0.0–0.7)
Eosinophils Relative: 3 %
HEMATOCRIT: 43.7 % (ref 36.0–46.0)
HEMOGLOBIN: 14.7 g/dL (ref 12.0–15.0)
Lymphocytes Relative: 35 %
Lymphs Abs: 2.1 10*3/uL (ref 0.7–4.0)
MCH: 29.7 pg (ref 26.0–34.0)
MCHC: 33.6 g/dL (ref 30.0–36.0)
MCV: 88.3 fL (ref 78.0–100.0)
Monocytes Absolute: 0.4 10*3/uL (ref 0.1–1.0)
Monocytes Relative: 6 %
NEUTROS ABS: 3.4 10*3/uL (ref 1.7–7.7)
NEUTROS PCT: 56 %
Platelets: 354 10*3/uL (ref 150–400)
RBC: 4.95 MIL/uL (ref 3.87–5.11)
RDW: 13 % (ref 11.5–15.5)
WBC: 6.1 10*3/uL (ref 4.0–10.5)

## 2017-05-14 LAB — LIPID PANEL
Cholesterol: 256 mg/dL — ABNORMAL HIGH (ref 0–200)
HDL: 51 mg/dL (ref >40)
LDL CALC: 164 mg/dL — AB (ref 0–99)
Total CHOL/HDL Ratio: 5 RATIO
Triglycerides: 205 mg/dL — ABNORMAL HIGH (ref <150)
VLDL: 41 mg/dL — ABNORMAL HIGH (ref 0–40)

## 2017-05-14 LAB — I-STAT TROPONIN, ED: Troponin i, poc: 0.01 ng/mL (ref 0.00–0.08)

## 2017-05-14 LAB — COMPREHENSIVE METABOLIC PANEL
ALK PHOS: 77 U/L (ref 38–126)
ALT: 16 U/L (ref 14–54)
ANION GAP: 10 (ref 5–15)
AST: 25 U/L (ref 15–41)
Albumin: 3.9 g/dL (ref 3.5–5.0)
BILIRUBIN TOTAL: 0.5 mg/dL (ref 0.3–1.2)
BUN: 10 mg/dL (ref 6–20)
CALCIUM: 9.3 mg/dL (ref 8.9–10.3)
CO2: 27 mmol/L (ref 22–32)
Chloride: 102 mmol/L (ref 101–111)
Creatinine, Ser: 0.84 mg/dL (ref 0.44–1.00)
GFR calc non Af Amer: >60 mL/min (ref >60)
Glucose, Bld: 114 mg/dL — ABNORMAL HIGH (ref 65–99)
Potassium: 2.8 mmol/L — ABNORMAL LOW (ref 3.5–5.1)
Sodium: 139 mmol/L (ref 135–145)
TOTAL PROTEIN: 6.9 g/dL (ref 6.5–8.1)

## 2017-05-14 LAB — TROPONIN I

## 2017-05-14 LAB — MAGNESIUM: Magnesium: 2 mg/dL (ref 1.7–2.4)

## 2017-05-14 MED ORDER — HYDROCHLOROTHIAZIDE 25 MG PO TABS
25.0000 mg | ORAL_TABLET | Freq: Every day | ORAL | Status: DC
  Administered 2017-05-15: 25 mg via ORAL
  Filled 2017-05-14: qty 1

## 2017-05-14 MED ORDER — ENOXAPARIN SODIUM 40 MG/0.4ML ~~LOC~~ SOLN
40.0000 mg | SUBCUTANEOUS | Status: DC
  Filled 2017-05-14: qty 0.4

## 2017-05-14 MED ORDER — VITAMIN D3 50 MCG (2000 UT) PO TABS: 2000.0000 [IU] | ORAL_TABLET | Freq: Every day | ORAL | Status: DC

## 2017-05-14 MED ORDER — ONDANSETRON HCL 4 MG/2ML IJ SOLN: 4.0000 mg | Freq: Four times a day (QID) | INTRAMUSCULAR | Status: DC | PRN

## 2017-05-14 MED ORDER — POTASSIUM CHLORIDE CRYS ER 20 MEQ PO TBCR
40.0000 meq | EXTENDED_RELEASE_TABLET | Freq: Once | ORAL | Status: AC
  Administered 2017-05-14: 40 meq via ORAL
  Filled 2017-05-14: qty 2

## 2017-05-14 MED ORDER — PANTOPRAZOLE SODIUM 40 MG PO TBEC
40.0000 mg | DELAYED_RELEASE_TABLET | Freq: Two times a day (BID) | ORAL | Status: DC
  Administered 2017-05-14 – 2017-05-15 (×3): 40 mg via ORAL
  Filled 2017-05-14 (×3): qty 1

## 2017-05-14 MED ORDER — ALPRAZOLAM 0.25 MG PO TABS: 0.2500 mg | ORAL_TABLET | Freq: Two times a day (BID) | ORAL | Status: DC | PRN

## 2017-05-14 MED ORDER — HYDROCODONE-ACETAMINOPHEN 5-325 MG PO TABS: 1.0000 | ORAL_TABLET | Freq: Four times a day (QID) | ORAL | Status: DC | PRN

## 2017-05-14 MED ORDER — SODIUM CHLORIDE 0.9 % IV SOLN
INTRAVENOUS | Status: AC
  Administered 2017-05-14: 10:00:00 via INTRAVENOUS

## 2017-05-14 MED ORDER — DM-GUAIFENESIN ER 30-600 MG PO TB12: 1.0000 | ORAL_TABLET | Freq: Two times a day (BID) | ORAL | Status: DC | PRN

## 2017-05-14 MED ORDER — TIZANIDINE HCL 4 MG PO TABS: 4.0000 mg | ORAL_TABLET | Freq: Four times a day (QID) | ORAL | Status: DC | PRN

## 2017-05-14 MED ORDER — PRAVASTATIN SODIUM 40 MG PO TABS: 20.0000 mg | ORAL_TABLET | Freq: Every day | ORAL | Status: DC

## 2017-05-14 MED ORDER — FLUTICASONE PROPIONATE 50 MCG/ACT NA SUSP: 1.0000 | Freq: Every day | NASAL | Status: DC | PRN

## 2017-05-14 MED ORDER — VITAMIN D 1000 UNITS PO TABS
2000.0000 [IU] | ORAL_TABLET | Freq: Every day | ORAL | Status: DC
  Administered 2017-05-14 – 2017-05-15 (×2): 2000 [IU] via ORAL
  Filled 2017-05-14 (×2): qty 2

## 2017-05-14 MED ORDER — GABAPENTIN 300 MG PO CAPS
600.0000 mg | ORAL_CAPSULE | Freq: Every day | ORAL | Status: DC
  Administered 2017-05-14: 600 mg via ORAL
  Filled 2017-05-14: qty 2

## 2017-05-14 MED ORDER — GI COCKTAIL ~~LOC~~: 30.0000 mL | Freq: Four times a day (QID) | ORAL | Status: DC | PRN

## 2017-05-14 MED ORDER — MORPHINE SULFATE (PF) 4 MG/ML IV SOLN: 2.0000 mg | INTRAVENOUS | Status: DC | PRN

## 2017-05-14 MED ORDER — ACETAMINOPHEN 325 MG PO TABS
650.0000 mg | ORAL_TABLET | ORAL | Status: DC | PRN
  Filled 2017-05-14: qty 2

## 2017-05-14 MED ORDER — GABAPENTIN 300 MG PO CAPS
300.0000 mg | ORAL_CAPSULE | Freq: Every morning | ORAL | Status: DC
  Administered 2017-05-14 – 2017-05-15 (×2): 300 mg via ORAL
  Filled 2017-05-14 (×2): qty 1

## 2017-05-14 MED ORDER — GI COCKTAIL ~~LOC~~
30.0000 mL | Freq: Once | ORAL | Status: AC
  Administered 2017-05-14: 30 mL via ORAL
  Filled 2017-05-14: qty 30

## 2017-05-14 NOTE — ED Provider Notes (Signed)
[facility] DEPT Provider Note   CSN: [id number] Arrival date & time: 05/14/17  0701     History   Chief Complaint Chief Complaint  Patient presents with  . Chest Pain  . Dizziness    HPI Nicole Daniels is a 66 y.o. female.  The history is provided by the patient and medical records. No language interpreter was used.  Chest Pain   Pertinent negatives include no abdominal pain, no dizziness, no headaches, no nausea, no numbness, no palpitations, no vomiting and no weakness.  Dizziness  Associated symptoms: chest pain   Associated symptoms: no blood in stool, no diarrhea, no headaches, no nausea, no palpitations, no vomiting and no weakness    Nicole Daniels is a 66 y.o. female  with a PMH of HTN, HLD who presents to the Emergency Department complaining of intermittent central chest tightness over the last week. Pain will last less than a minute then self-resolve. There is no exertional component. Pain mostly occurs when she is sitting down.  No alleviating or aggravating factors. When chest pain occurs, patient denies any other associated symptoms. No shortness of breath. She does endorses lightheadedness described as her "head feeling like it's floating" which has also been off-and-on over the last week. Worse when moving from sitting to standing. This will also last about a minute then self-resolve. Thirdly, patient endorses an increased amount of belching this week. No abdominal pain, nausea, vomiting, diarrhea, constipation, blood in the stool. She has taken no medications prior to arrival for these symptoms. She notes that the last week or two have been much more stressful than usual for her. She lost her husband and son approx. 1 year ago. Saturday (2 days ago) was the 1 year anniversary of losing her husband. Her son passed away 2 weeks later. She now lives alone and feels that her being alone during this difficult time has contributed to some of these symptoms, however  wanted to come to ER to make sure everything was okay.  No history of diabetes, heart disease. No family history of heart disease. Patient is not a smoker.  Past Medical History:  Diagnosis Date  . Abdominal pain, right lower quadrant   . Acquired cyst of kidney 01/08/2012   pt denies history of   . Acute sinusitis, unspecified   . Adenomatous colon polyp   . Angiomyolipoma    kidney  . Chest pain   . Chest pain, unspecified   . Depressive disorder, not elsewhere classified 01/08/2012   not currently   . Diverticulosis   . Dizziness and giddiness   . Dysfunction of eustachian tube   . Hemorrhoids   . History of diverticulitis of colon   . IBS (irritable bowel syndrome)   . Liver lesion 2011  . Neoplasm of skin of face    malignant  . OA (osteoarthritis)    knee  . Obesity, unspecified   . Ostium secundum type atrial septal defect   . Other and unspecified hyperlipidemia   . Other malaise and fatigue   . Palpitations   . Shortness of breath   . Unspecified essential hypertension    pt states she does not have HTN  . Unspecified sinusitis (chronic)   . Unspecified vitamin D deficiency     Patient Active Problem List   Diagnosis Date Noted  . Hypokalemia 05/14/2017  . Chest pain   . Intradural arachnoid cyst of spine 01/31/2017  . Well woman exam 01/11/2017  . Allergic reaction  11/12/2013  . Essential hypertension 12/06/2010  . MIGRAINE, CHRONIC 11/24/2010  . Vitamin D deficiency 04/08/2010  . DEPRESSION, MILD 03/09/2010  . RENAL CYST 12/22/2009  . OBESITY 08/11/2008  . NEOPLASM, MALIGNANT, SKIN, FACE 12/01/2007  . PATENT FORAMEN OVALE 01/28/2005  . HLD (hyperlipidemia) 03/30/2001    Past Surgical History:  Procedure Laterality Date  . ABDOMINAL HYSTERECTOMY    . BREAST REDUCTION SURGERY  1990  . CHOLECYSTECTOMY    . COLONOSCOPY    . DILATION AND CURETTAGE OF UTERUS    . FLEXIBLE SIGMOIDOSCOPY  01/08/2012   Procedure: FLEXIBLE SIGMOIDOSCOPY;  Surgeon: Lafayette Dragon, MD;  Location: [facility];  Service: Endoscopy;  Laterality: N/A;  . HEMORRHOID SURGERY    . KNEE ARTHROTOMY     pt states no   . LAMINECTOMY N/A 01/31/2017   Procedure: Thoracic Laminectomy and marsupialization of arachnoid cyst;  Surgeon: Jovita Gamma, MD;  Location: [facility];  Service: Neurosurgery;  Laterality: N/A;  . LEFT OOPHORECTOMY  1976  . PARTIAL HYSTERECTOMY     right ovary remains  . TONSILLECTOMY  1972    OB History    No data available       Home Medications    Prior to Admission medications   Medication Sig Start Date End Date Taking? Authorizing Provider  Cholecalciferol (VITAMIN D3) 2000 units TABS Take 2,000 Units by mouth daily.   Yes [provider]  gabapentin (NEURONTIN) 300 MG capsule 1 by mouth at night for 7 days then 1 by mouth twice per day for 7 days then 1 by mouth three times per day. Patient taking differently: Take 300-600 mg by mouth See admin instructions. Take 300mg  in the morning, and 600mg  at night 12/21/16  Yes Magnus Sinning, MD  hydrochlorothiazide (HYDRODIURIL) 25 MG tablet TAKE 1 TABLET (25 MG TOTAL) BY MOUTH AT BEDTIME. Patient taking differently: TAKE 1 TABLET (25 MG TOTAL) BY MOUTH IN THE MORNING. 11/06/16  Yes Lucille Passy, MD  fluticasone [facility]) 50 MCG/ACT nasal spray Place 1 spray into both nostrils as needed for allergies.     [provider]  HYDROcodone-acetaminophen (NORCO) 5-325 MG tablet Take 1 tablet by mouth every 6 (six) hours as needed. Patient taking differently: Take 1 tablet by mouth every 6 (six) hours as needed. For pain. 12/21/16   Magnus Sinning, MD  pravastatin (PRAVACHOL) 20 MG tablet Take 1 tablet (20 mg total) by mouth daily. Patient not taking: Reported on 05/14/2017 01/11/17   Lucille Passy, MD  tiZANidine (ZANAFLEX) 4 MG tablet Take 1 tablet (4 mg total) by mouth every 6 (six) hours as needed for muscle spasms. Patient not taking: Reported on 05/14/2017 12/12/16   Leandrew Koyanagi, MD     Family History Family History  Problem Relation Age of Onset  . Cirrhosis Father   . Heart disease Father   . Hypertension Mother   . Diabetes Mother   . Breast cancer Cousin   . Hypertension Sister   . Diabetes Cousin   . Diabetes Unknown        aunt  . Stroke Unknown        aunt  . Heart disease Brother        x 2  . Colon cancer Neg Hx     Social History Social History  Substance Use Topics  . 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 Smokeless tobacco: Never Used  . Alcohol use No     Allergies   Simvastatin; Statins;  and Bactrim [sulfamethoxazole-trimethoprim]   Review of Systems Review of Systems  Cardiovascular: Positive for chest pain. Negative for palpitations and leg swelling.  Gastrointestinal: Negative for abdominal pain, blood in stool, constipation, diarrhea, nausea and vomiting.       + belching  Neurological: Positive for light-headedness. Negative for dizziness, weakness, numbness and headaches.  All other systems reviewed and are negative.    Physical Exam Updated Vital Signs BP (!) 134/56   Pulse 77   Temp 98.3 F (36.8 C) (Oral)   Resp 15   Ht 5\' 5"  (1.651 m)   Wt 81.6 kg (180 lb)   SpO2 97%   BMI 29.95 kg/m   Physical Exam  Constitutional: She is oriented to person, place, and time. She appears well-developed and well-nourished. No distress.  HENT:  Head: Normocephalic and atraumatic.  Neck: No JVD present.  Cardiovascular: Normal rate, regular rhythm and normal heart sounds.   No murmur heard. Pulmonary/Chest: Effort normal and breath sounds normal. No respiratory distress. She has no wheezes. She has no rales. She exhibits tenderness.  Abdominal: Soft. Bowel sounds are normal. She exhibits no distension. There is no tenderness.  Musculoskeletal: She exhibits no edema.  Neurological: She is alert and oriented to person, place, and time.  Speech clear and goal oriented. CN 2-12 grossly intact. Normal finger-to-nose and rapid  alternating movements. No drift. Strength and sensation intact.  Skin: Skin is warm and dry.  Nursing note and vitals reviewed.    ED Treatments / Results  Labs (all labs ordered are listed, but only abnormal results are displayed) Labs Reviewed  COMPREHENSIVE METABOLIC PANEL - Abnormal; Notable for the following:       Result Value   Potassium 2.8 (*)    Glucose, Bld 114 (*)    All other components within normal limits  LIPID PANEL - Abnormal; Notable for the following:    Cholesterol 256 (*)    Triglycerides 205 (*)    VLDL 41 (*)    LDL Cholesterol 164 (*)    All other components within normal limits  CBC WITH DIFFERENTIAL/PLATELET  MAGNESIUM  TROPONIN I  TROPONIN I  TROPONIN I  I-STAT TROPOININ, ED    EKG  EKG Interpretation  Date/Time:  Monday May 14 2017 07:06:06 EDT Ventricular Rate:  93 PR Interval:  158 QRS Duration: 86 QT Interval:  346 QTC Calculation: 430 R Axis:   46 Text Interpretation:  Normal sinus rhythm Nonspecific ST abnormality Abnormal ECG No acute changes Confirmed by Brantley Stage [phone]) on 05/14/2017 7:56:05 AM       Radiology Dg Chest 2 View  Result Date: 05/14/2017 CLINICAL DATA:  Chest pain EXAM: CHEST  2 VIEW COMPARISON:  03/08/2013 FINDINGS: Mild atherosclerotic calcification of the aortic arch. Heart size within normal limits. The lungs appear clear. Mild thoracic spondylosis. No pleural effusion. IMPRESSION: 1.  No active cardiopulmonary disease is radiographically apparent. 2.  Aortic Atherosclerosis (ICD10-I70.0). Electronically Signed   By: Van Clines M.D.   On: 05/14/2017 07:57    Procedures Procedures (including critical care time)  Medications Ordered in ED Medications  dextromethorphan-guaiFENesin (MUCINEX DM) 30-600 MG per 12 hr tablet 1 tablet (not administered)  fluticasone (FLONASE) 50 MCG/ACT nasal spray 1 spray (not administered)  gabapentin (NEURONTIN) capsule 600 mg (not administered)   HYDROcodone-acetaminophen (NORCO/VICODIN) 5-325 MG per tablet 1 tablet (not administered)  tiZANidine (ZANAFLEX) tablet 4 mg (not administered)  hydrochlorothiazide (HYDRODIURIL) tablet 25 mg (not administered)  acetaminophen (TYLENOL) tablet 650 mg (  not administered)  ondansetron (ZOFRAN) injection 4 mg (not administered)  enoxaparin (LOVENOX) injection 40 mg (not administered)  morphine 4 MG/ML injection 2 mg (not administered)  gi cocktail (Maalox,Lidocaine,Donnatal) (not administered)  gi cocktail (Maalox,Lidocaine,Donnatal) (not administered)  potassium chloride SA (K-DUR,KLOR-CON) CR tablet 40 mEq (not administered)  0.9 %  sodium chloride infusion (not administered)  ALPRAZolam (XANAX) tablet 0.25 mg (not administered)  cholecalciferol (VITAMIN D) tablet 2,000 Units (not administered)  potassium chloride SA (K-DUR,KLOR-CON) CR tablet 40 mEq (40 mEq Oral Given 05/14/17 1610)     Initial Impression / Assessment and Plan / ED Course  I have reviewed the triage vital signs and the nursing notes.  Pertinent labs & imaging results that were available during my care of the patient were reviewed by me and considered in my medical decision making (see chart for details).    Nicole Daniels is a 66 y.o. female who presents to ED for intermittent chest pain as well as occasional lightheaded spells and increased belching x 1 week. Heart score of 4. No prior heart cath / provocative testing. Labs reviewed: hypokalemic at 2.8, replenished in ED. Mag ordered. Troponin negative. EKG non-ischemic. CXR negative for acute cardiopulmonary disease, but does note aortic atherosclerosis. Moderate risk for cardiac etiology. Hospitalist consulted who will admit.   Patient seen by and discussed with Dr. Oleta Mouse who agrees with treatment plan.   Final Clinical Impressions(s) / ED Diagnoses   Final diagnoses:  Chest pain  Hypokalemia    New Prescriptions Current Discharge Medication List        Kasean Denherder, Ozella Almond, PA-C 05/14/17 [id number]    Forde Dandy, MD 05/14/17 [phone]

## 2017-05-14 NOTE — ED Triage Notes (Addendum)
PT from home c/o chest pain and dizziness x 1 week.  States dizziness increases when moving from sitting to standing position. Chest pain comes and goes without any pattern. States lost husband and son 13 days apart and this week would be 1 year.

## 2017-05-14 NOTE — Progress Notes (Signed)
   Introduced chaplaincy services.  Will follow, as needed.  

## 2017-05-14 NOTE — Progress Notes (Signed)
Pt requesting AM dose of gabapentin.  Bedtime dose is all that's ordered. MD paged.

## 2017-05-14 NOTE — ED Provider Notes (Signed)
Medical screening examination/treatment/procedure(s) were conducted as a shared visit with non-physician practitioner(s) and myself.  I personally evaluated the patient during the encounter.   EKG Interpretation  Date/Time:  Monday May 14 2017 07:06:06 EDT Ventricular Rate:  93 PR Interval:  158 QRS Duration: 86 QT Interval:  346 QTC Calculation: 430 R Axis:   46 Text Interpretation:  Normal sinus rhythm Nonspecific ST abnormality Abnormal ECG No acute changes Confirmed by Brantley Stage [phone]) on 05/14/2017 7:56:4 AM      66 year old female who presents with intermittent chest pain and dizziness for one week. History of hypertension, hyperlipidemia, and obesity. This is currently the 1 year anniversary of the death of her son and husband. Reports intermittent episodes of chest pressure and tightness at rest that is nonradiating. Symptoms not worsened or brought on by exertion. Symptoms not associated with eating. Intermittently has also been feeling lightheaded when changing positions from sitting to standing. No syncope.   She is well-appearing and in no acute distress. Does become intermittently tearful when discussing her recent stressors. Her vitals are stable. Her EKG is not acutely ischemic. Her first troponin is normal. Her chest x-ray is visualized and shows no acute cardiopulmonary processes. Symptoms are not concerning for PE or dissection at this time. Symptoms are not typical for ACS, but with overall heart score of 4. Plan to admit for observation for serial troponins and ACS rule out.   Forde Dandy, MD 05/14/17 [phone]

## 2017-05-14 NOTE — H&P (Signed)
History and Physical    Nicole Daniels WCH:[id number] DOB: 1951/03/23 DOA: 05/14/2017  PCP: Lucille Passy, MD Patient coming from: home  Chief Complaint: chest pain  HPI: Nicole Daniels is a very pleasant 66 y.o. female with medical history significant hypertension, hyperlipidemia, obesity, vitamin D deficiency, irritable bowel syndrome, diverticulosis, anxiety/depression presents to the emergency Department chief complaint of chest pain. Patient being admitted for chest pain rule out.  Information is obtained from the patient. She states she has had intermittent chest pain over the last 7 days. She describes the pain as a "pressure" located left anterior chest. She states on one of these occasions it radiated to her left shoulder. Associated symptoms include intermittent dizziness with position change and 1 episode of shortness of breath. She states it felt like indigestion but denies taking any over-the-counter medications. She denies headache nausea vomiting diaphoresis lower extremity edema. She denies abdominal pain dysuria hematuria frequency or urgency. She denies fever chills cough recent travel or sick contacts. She denies diarrhea constipation melena or bright red blood per rectum. She does indicate that this is the one-year anniversary of the death of her husband and her son and she's feeling a little more anxious than usual.    ED Course: In the emergency department he's afebrile hemodynamically stable and not hypoxic. She is provided with 40 mEq of potassium.  Review of Systems: As per HPI otherwise all other systems reviewed and are negative.   Ambulatory Status: She ambulates independently lives alone is independent with ADLs  Past Medical History:  Diagnosis Date  . Abdominal pain, right lower quadrant   . Acquired cyst of kidney 01/08/2012   pt denies history of   . Acute sinusitis, unspecified   . Adenomatous colon polyp   . Angiomyolipoma    kidney  . Chest  pain   . Chest pain, unspecified   . Depressive disorder, not elsewhere classified 01/08/2012   not currently   . Diverticulosis   . Dizziness and giddiness   . Dysfunction of eustachian tube   . Hemorrhoids   . History of diverticulitis of colon   . IBS (irritable bowel syndrome)   . Liver lesion 2011  . Neoplasm of skin of face    malignant  . OA (osteoarthritis)    knee  . Obesity, unspecified   . Ostium secundum type atrial septal defect   . Other and unspecified hyperlipidemia   . Other malaise and fatigue   . Palpitations   . Shortness of breath   . Unspecified essential hypertension    pt states she does not have HTN  . Unspecified sinusitis (chronic)   . Unspecified vitamin D deficiency     Past Surgical History:  Procedure Laterality Date  . ABDOMINAL HYSTERECTOMY    . BREAST REDUCTION SURGERY  1990  . CHOLECYSTECTOMY    . COLONOSCOPY    . DILATION AND CURETTAGE OF UTERUS    . FLEXIBLE SIGMOIDOSCOPY  01/08/2012   Procedure: FLEXIBLE SIGMOIDOSCOPY;  Surgeon: Lafayette Dragon, MD;  Location: [facility];  Service: Endoscopy;  Laterality: N/A;  . HEMORRHOID SURGERY    . KNEE ARTHROTOMY     pt states no   . LAMINECTOMY N/A 01/31/2017   Procedure: Thoracic Laminectomy and marsupialization of arachnoid cyst;  Surgeon: Jovita Gamma, MD;  Location: [place];  Service: Neurosurgery;  Laterality: N/A;  . LEFT OOPHORECTOMY  1976  . PARTIAL HYSTERECTOMY     right ovary remains  . TONSILLECTOMY  3    Social History   Social History  . Marital status: Married    Spouse name: N/A  . Number of children: 2  . Years of education: N/A   Occupational History  . Bus Router for [employer] History Main Topics  . 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 Smokeless tobacco: Never Used  . Alcohol use No  . Drug use: No  . Sexual activity: No   Other Topics Concern  . Not on file   Social History Narrative   Daily caffeine     Allergies  Allergen Reactions    . Simvastatin Other (See Comments)    MYALGIAS  . Bactrim [Sulfamethoxazole-Trimethoprim] Itching    Family History  Problem Relation Age of Onset  . Cirrhosis Father   . Heart disease Father   . Hypertension Mother   . Diabetes Mother   . Breast cancer Cousin   . Hypertension Sister   . Diabetes Cousin   . Diabetes Unknown        aunt  . Stroke Unknown        aunt  . Heart disease Brother        x 2  . Colon cancer Neg Hx     Prior to Admission medications   Medication Sig Start Date End Date Taking? Authorizing Provider  Cholecalciferol (VITAMIN D3) 2000 units TABS Take 2,000 Units by mouth daily.    [provider]  dextromethorphan-guaiFENesin (MUCINEX DM) 30-600 MG 12hr tablet Take 1 tablet by mouth 2 (two) times daily as needed for cough.    [provider]  fluticasone (FLONASE) 50 MCG/ACT nasal spray Place 1 spray into both nostrils daily.    [provider]  gabapentin (NEURONTIN) 300 MG capsule 1 by mouth at night for 7 days then 1 by mouth twice per day for 7 days then 1 by mouth three times per day. Patient taking differently: Take 600 mg by mouth at bedtime.  12/21/16   Magnus Sinning, MD  hydrochlorothiazide (HYDRODIURIL) 25 MG tablet TAKE 1 TABLET (25 MG TOTAL) BY MOUTH AT BEDTIME. Patient taking differently: TAKE 1 TABLET (25 MG TOTAL) BY MOUTH IN THE MORNING. 11/06/16   Lucille Passy, MD  HYDROcodone-acetaminophen (NORCO) 5-325 MG tablet Take 1 tablet by mouth every 6 (six) hours as needed. Patient taking differently: Take 1 tablet by mouth every 6 (six) hours as needed. For pain. 12/21/16   Magnus Sinning, MD  HYDROcodone-acetaminophen (NORCO/VICODIN) 5-325 MG tablet Take 1-2 tablets by mouth every 4 (four) hours as needed (pain). 02/01/17   Jovita Gamma, MD  pravastatin (PRAVACHOL) 20 MG tablet Take 1 tablet (20 mg total) by mouth daily. Patient taking differently: Take 20 mg by mouth at bedtime.  01/11/17   Lucille Passy, MD   tiZANidine (ZANAFLEX) 4 MG tablet Take 1 tablet (4 mg total) by mouth every 6 (six) hours as needed for muscle spasms. Patient taking differently: Take 4 mg by mouth every 6 (six) hours as needed for muscle spasms. For muscle spasms. 12/12/16   Leandrew Koyanagi, MD    Physical Exam: Vitals:   05/14/17 0708 05/14/17 0709 05/14/17 0730 05/14/17 0800  BP: (!) 128/115  (!) 147/77 (!) 145/61  Pulse: 94  75 75  Resp: 16  (!) 21 16  Temp: 98.4 F (36.9 C) 98.3 F (36.8 C)    TempSrc: Oral Oral    SpO2: 94%  96% 96%  Weight:  81.6 kg (180  lb)    Height:  5\' 5"  (1.651 m)       General:  Appears calm and comfortable Sitting up in bed watching TV Eyes:  PERRL, EOMI, normal lids, iris ENT:  grossly normal hearing, lips & tongue, mucous membranes of her mouth are moist and pink Neck:  no LAD, masses or thyromegaly Cardiovascular:  RRR, no m/r/g. No LE edema. Pedal pulses present and palpable Respiratory:  CTA bilaterally, no w/r/r. Normal respiratory effort. Abdomen:  soft, ntnd, positive bowel sounds throughout no guarding or rebounding Skin:  no rash or induration seen on limited exam Musculoskeletal:  grossly normal tone BUE/BLE, good ROM, no bony abnormality Psychiatric:  grossly normal mood and affect, speech fluent and appropriate, AOx3 Neurologic:  CN 2-12 grossly intact, moves all extremities in coordinated fashion, sensation intact speech clear facial symmetry  Labs on Admission: I have personally reviewed following labs and imaging studies  CBC:  Recent Labs Lab 05/14/17 0724  WBC 6.1  NEUTROABS 3.4  HGB 14.7  HCT 43.7  MCV 88.3  PLT 258   Basic Metabolic Panel:  Recent Labs Lab 05/14/17 0724  NA 139  K 2.8*  CL 102  CO2 27  GLUCOSE 114*  BUN 10  CREATININE 0.84  CALCIUM 9.3  MG 2.0   GFR: Estimated Creatinine Clearance: 68.5 mL/min (by C-G formula based on SCr of 0.84 mg/dL). Liver Function Tests:  Recent Labs Lab 05/14/17 0724  AST 25  ALT 16   ALKPHOS 77  BILITOT 0.5  PROT 6.9  ALBUMIN 3.9   No results for input(s): LIPASE, AMYLASE in the last 168 hours. No results for input(s): AMMONIA in the last 168 hours. Coagulation Profile: No results for input(s): INR, PROTIME in the last 168 hours. Cardiac Enzymes: No results for input(s): CKTOTAL, CKMB, CKMBINDEX, TROPONINI in the last 168 hours. BNP (last 3 results) No results for input(s): PROBNP in the last 8760 hours. HbA1C: No results for input(s): HGBA1C in the last 72 hours. CBG: No results for input(s): GLUCAP in the last 168 hours. Lipid Profile: No results for input(s): CHOL, HDL, LDLCALC, TRIG, CHOLHDL, LDLDIRECT in the last 72 hours. Thyroid Function Tests: No results for input(s): TSH, T4TOTAL, FREET4, T3FREE, THYROIDAB in the last 72 hours. Anemia Panel: No results for input(s): VITAMINB12, FOLATE, FERRITIN, TIBC, IRON, RETICCTPCT in the last 72 hours. Urine analysis:    Component Value Date/Time   BILIRUBINUR neg 11/05/2013 1106   PROTEINUR trace 11/05/2013 1106   UROBILINOGEN 0.2 11/05/2013 1106   NITRITE neg 11/05/2013 1106   LEUKOCYTESUR small (1+) 11/05/2013 1106    Creatinine Clearance: Estimated Creatinine Clearance: 68.5 mL/min (by C-G formula based on SCr of 0.84 mg/dL).  Sepsis Labs: @LABRCNTIP (procalcitonin:4,lacticidven:4) )No results found for this or any previous visit (from the past 240 hour(s)).   Radiological Exams on Admission: Dg Chest 2 View  Result Date: 05/14/2017 CLINICAL DATA:  Chest pain EXAM: CHEST  2 VIEW COMPARISON:  03/08/2013 FINDINGS: Mild atherosclerotic calcification of the aortic arch. Heart size within normal limits. The lungs appear clear. Mild thoracic spondylosis. No pleural effusion. IMPRESSION: 1.  No active cardiopulmonary disease is radiographically apparent. 2.  Aortic Atherosclerosis (ICD10-I70.0). Electronically Signed   By: Van Clines M.D.   On: 05/14/2017 07:57    EKG: Independently reviewed.  Normal sinus rhythm Nonspecific ST abnormality Abnormal ECG No acute changes  Assessment/Plan Principal Problem:   Chest pain Active Problems:   Vitamin D deficiency   HLD (hyperlipidemia)   OBESITY  Essential hypertension   Intradural arachnoid cyst of spine   Hypokalemia   #1. Chest pain. Somewhat atypical. Heart score 4. Likely related to anxiety as this is the anniversary of her husband and son's death. EKG without acute changes. Initial troponin negative. She has been prescribed a statin but states she does not take it due to side effects. At time of admission she is chest pain-free -Admit to telemetry -Cycle troponin -Serial EKG -GI cocktail -low dose anti-anxiety -Obtain a lipid panel -If she rules out would recommend close follow-up with PCP to discuss need for outpatient stress test  #2. Hypokalemia. Potassium level II.9. Likely related to her home medications. She was provided with 40 mEq of potassium in the emergency department -We'll obtain a magnesium level -We'll provide another 40 mEq of potassium in 4 hours -We'll recheck in the morning -We will hold home Hydrodiuril until tomorrow -gently IV fluids -consider low dose daily supplement at discharge  #3. Hypertension. Blood pressure high end of normal upon presentation. At the time of admission blood pressure within the limits of normal -Hold Hydrodiuril until tomorrow -monitor  4. Hyperlipidemia. Patient has been prescribed statin. She does not take due to side effects. -Obtain a lipid panel  #5. Obesity. BMI 30. -Nutritional consult    DVT prophylaxis: lovneox Code Status: full  Family Communication: sister at bedside  Disposition Plan: home in am  Consults called: none  Admission status: obs    Radene Gunning MD [facility]  If 7PM-7AM, please contact night-coverage [email or web address] Password [facility]  05/14/2017, 9:32 AM

## 2017-05-15 DIAGNOSIS — Z6830 Body mass index (BMI) 30.0-30.9, adult: Secondary | ICD-10-CM

## 2017-05-15 DIAGNOSIS — E785 Hyperlipidemia, unspecified: Secondary | ICD-10-CM | POA: Diagnosis not present

## 2017-05-15 DIAGNOSIS — E669 Obesity, unspecified: Secondary | ICD-10-CM

## 2017-05-15 DIAGNOSIS — E876 Hypokalemia: Secondary | ICD-10-CM | POA: Diagnosis not present

## 2017-05-15 DIAGNOSIS — I10 Essential (primary) hypertension: Secondary | ICD-10-CM | POA: Diagnosis not present

## 2017-05-15 DIAGNOSIS — R0789 Other chest pain: Secondary | ICD-10-CM | POA: Diagnosis not present

## 2017-05-15 DIAGNOSIS — R079 Chest pain, unspecified: Secondary | ICD-10-CM | POA: Diagnosis not present

## 2017-05-15 LAB — BASIC METABOLIC PANEL
Anion gap: 6 (ref 5–15)
BUN: 10 mg/dL (ref 6–20)
CALCIUM: 8.8 mg/dL — AB (ref 8.9–10.3)
CO2: 27 mmol/L (ref 22–32)
CREATININE: 0.79 mg/dL (ref 0.44–1.00)
Chloride: 106 mmol/L (ref 101–111)
GLUCOSE: 108 mg/dL — AB (ref 65–99)
Potassium: 3.4 mmol/L — ABNORMAL LOW (ref 3.5–5.1)
Sodium: 139 mmol/L (ref 135–145)

## 2017-05-15 MED ORDER — POTASSIUM CHLORIDE ER 10 MEQ PO TBCR
10.0000 meq | EXTENDED_RELEASE_TABLET | Freq: Every day | ORAL | 0 refills | Status: DC
Start: 2017-05-15 — End: 2017-10-18

## 2017-05-15 MED ORDER — POTASSIUM CHLORIDE CRYS ER 20 MEQ PO TBCR
40.0000 meq | EXTENDED_RELEASE_TABLET | Freq: Once | ORAL | Status: AC
  Administered 2017-05-15: 40 meq via ORAL
  Filled 2017-05-15: qty 2

## 2017-05-15 NOTE — Discharge Instructions (Signed)
Nonspecific Chest Pain Chest pain can be caused by many different conditions. There is a chance that your pain could be related to something serious, such as a heart attack or a blood clot in your lungs. Chest pain can also be caused by conditions that are not life-threatening. If you have chest pain, it is very important to follow up with your doctor. Follow these instructions at home: Medicines  If you were prescribed an antibiotic medicine, take it as told by your doctor. Do not stop taking the antibiotic even if you start to feel better.  Take over-the-counter and prescription medicines only as told by your doctor. Lifestyle  Do not use any products that contain nicotine or tobacco, such as cigarettes and e-cigarettes. If you need help quitting, ask your doctor.  Do not drink alcohol.  Make lifestyle changes as told by your doctor. These may include: ? Getting regular exercise. Ask your doctor for some activities that are safe for you. ? Eating a heart-healthy diet. A diet specialist (dietitian) can help you to learn healthy eating options. ? Staying at a healthy weight. ? Managing diabetes, if needed. ? Lowering your stress, as with deep breathing or spending time in nature. General instructions  Avoid any activities that make you feel chest pain.  If your chest pain is because of heartburn: ? Raise (elevate) the head of your bed about 6 inches (15 cm). You can do this by putting blocks under the bed legs at the head of the bed. ? Do not sleep with extra pillows under your head. That does not help heartburn.  Keep all follow-up visits as told by your doctor. This is important. This includes any further testing if your chest pain does not go away. Contact a doctor if:  Your chest pain does not go away.  You have a rash with blisters on your chest.  You have a fever.  You have chills. Get help right away if:  Your chest pain is worse.  You have a cough that gets worse, or  you cough up blood.  You have very bad (severe) pain in your belly (abdomen).  You are very weak.  You pass out (faint).  You have either of these for no clear reason: ? Sudden chest discomfort. ? Sudden discomfort in your arms, back, neck, or jaw.  You have shortness of breath at any time.  You suddenly start to sweat, or your skin gets clammy.  You feel sick to your stomach (nauseous).  You throw up (vomit).  You suddenly feel light-headed or dizzy.  Your heart starts to beat fast, or it feels like it is skipping beats. These symptoms may be an emergency. Do not wait to see if the symptoms will go away. Get medical help right away. Call your local emergency services (911 in the [place] Do not drive yourself to the hospital. This information is not intended to replace advice given to you by your health care provider. Make sure you discuss any questions you have with your health care provider. Document Released: 04/03/2008 Document Revised: 07/10/2016 Document Reviewed: 07/10/2016 Elsevier Interactive Patient Education  2017 [employer].   Generalized Anxiety Disorder, Adult Generalized anxiety disorder (GAD) is a mental health disorder. People with this condition constantly worry about everyday events. Unlike normal anxiety, worry related to GAD is not triggered by a specific event. These worries also do not fade or get better with time. GAD interferes with life functions, including relationships, work, and school. GAD  can vary from mild to severe. People with severe GAD can have intense waves of anxiety with physical symptoms (panic attacks). What are the causes? The exact cause of GAD is not known. What increases the risk? This condition is more likely to develop in:  Women.  People who have a family history of anxiety disorders.  People who are very shy.  People who experience very stressful life events, such as the death of a loved one.  People who have a very  stressful family environment.  What are the signs or symptoms? People with GAD often worry excessively about many things in their lives, such as their health and family. They may also be overly concerned about:  Doing well at work.  Being on time.  Natural disasters.  Friendships.  Physical symptoms of GAD include:  Fatigue.  Muscle tension or having muscle twitches.  Trembling or feeling shaky.  Being easily startled.  Feeling like your heart is pounding or racing.  Feeling out of breath or like you cannot take a deep breath.  Having trouble falling asleep or staying asleep.  Sweating.  Nausea, diarrhea, or irritable bowel syndrome (IBS).  Headaches.  Trouble concentrating or remembering facts.  Restlessness.  Irritability.  How is this diagnosed? Your health care provider can diagnose GAD based on your symptoms and medical history. You will also have a physical exam. The health care provider will ask specific questions about your symptoms, including how severe they are, when they started, and if they come and go. Your health care provider may ask you about your use of alcohol or drugs, including prescription medicines. Your health care provider may refer you to a mental health specialist for further evaluation. Your health care provider will do a thorough examination and may perform additional tests to rule out other possible causes of your symptoms. To be diagnosed with GAD, a person must have anxiety that:  Is out of his or her control.  Affects several different aspects of his or her life, such as work and relationships.  Causes distress that makes him or her unable to take part in normal activities.  Includes at least three physical symptoms of GAD, such as restlessness, fatigue, trouble concentrating, irritability, muscle tension, or sleep problems.  Before your health care provider can confirm a diagnosis of GAD, these symptoms must be present more days  than they are not, and they must last for six months or longer. How is this treated? The following therapies are usually used to treat GAD:  Medicine. Antidepressant medicine is usually prescribed for long-term daily control. Antianxiety medicines may be added in severe cases, especially when panic attacks occur.  Talk therapy (psychotherapy). Certain types of talk therapy can be helpful in treating GAD by providing support, education, and guidance. Options include: ? Cognitive behavioral therapy (CBT). People learn coping skills and techniques to ease their anxiety. They learn to identify unrealistic or negative thoughts and behaviors and to replace them with positive ones. ? Acceptance and commitment therapy (ACT). This treatment teaches people how to be mindful as a way to cope with unwanted thoughts and feelings. ? Biofeedback. This process trains you to manage your body's response (physiological response) through breathing techniques and relaxation methods. You will work with a therapist while machines are used to monitor your physical symptoms.  Stress management techniques. These include yoga, meditation, and exercise.  A mental health specialist can help determine which treatment is best for you. Some people see improvement with one  type of therapy. However, other people require a combination of therapies. Follow these instructions at home:  Take over-the-counter and prescription medicines only as told by your health care provider.  Try to maintain a normal routine.  Try to anticipate stressful situations and allow extra time to manage them.  Practice any stress management or self-calming techniques as taught by your health care provider.  Do not punish yourself for setbacks or for not making progress.  Try to recognize your accomplishments, even if they are small.  Keep all follow-up visits as told by your health care provider. This is important. Contact a health care provider  if:  Your symptoms do not get better.  Your symptoms get worse.  You have signs of depression, such as: ? A persistently sad, cranky, or irritable mood. ? Loss of enjoyment in activities that used to bring you joy. ? Change in weight or eating. ? Changes in sleeping habits. ? Avoiding friends or family members. ? Loss of energy for normal tasks. ? Feelings of guilt or worthlessness. Get help right away if:  You have serious thoughts about hurting yourself or others. If you ever feel like you may hurt yourself or others, or have thoughts about taking your own life, get help right away. You can go to your nearest emergency department or call:  Your local emergency services (911 in the [place]  A suicide crisis helpline, such as the [facility] at 272-629-9801. This is open 24 hours a day.  Summary  Generalized anxiety disorder (GAD) is a mental health disorder that involves worry that is not triggered by a specific event.  People with GAD often worry excessively about many things in their lives, such as their health and family.  GAD may cause physical symptoms such as restlessness, trouble concentrating, sleep problems, frequent sweating, nausea, diarrhea, headaches, and trembling or muscle twitching.  A mental health specialist can help determine which treatment is best for you. Some people see improvement with one type of therapy. However, other people require a combination of therapies. This information is not intended to replace advice given to you by your health care provider. Make sure you discuss any questions you have with your health care provider. Document Released: 02/10/2013 Document Revised: 09/05/2016 Document Reviewed: 09/05/2016 Elsevier Interactive Patient Education  [employer].

## 2017-05-15 NOTE — Discharge Summary (Signed)
Physician Discharge Summary  Nicole Daniels ZOX:[id number] DOB: 08-11-1951 DOA: 05/14/2017  PCP: Nicole Passy, MD  Admit date: 05/14/2017 Discharge date: 05/15/2017  Time spent: 45 minutes  Recommendations for Outpatient Follow-up:  Patient will be discharged to home.  Patient will need to follow up with primary care provider within one week of discharge, repeat BMP and discuss cholesterol.  Consider cardiology follow up.  Patient should continue medications as prescribed.  Patient should follow a heart healthy diet.   Discharge Diagnoses:  Chest pain Hypokalemia Essential Hypertension Hyperlipidemia Obesity Anxiety  Discharge Condition: Stable  Diet recommendation: Heart healthy  Filed Weights   05/14/17 0709 05/14/17 1004 05/15/17 0603  Weight: 81.6 kg (180 lb) 81.8 kg (180 lb 4 oz) 82.1 kg (181 lb 1.6 oz)    History of present illness:  On 05/14/2017 by Ms. Nicole Carrel, NP Nicole Daniels is a very pleasant 66 y.o. female with medical history significant hypertension, hyperlipidemia, obesity, vitamin D deficiency, irritable bowel syndrome, diverticulosis, anxiety/depression presents to the emergency Department chief complaint of chest pain. Patient being admitted for chest pain rule out.  Information is obtained from the patient. She states she has had intermittent chest pain over the last 7 days. She describes the pain as a "pressure" located left anterior chest. She states on one of these occasions it radiated to her left shoulder. Associated symptoms include intermittent dizziness with position change and 1 episode of shortness of breath. She states it felt like indigestion but denies taking any over-the-counter medications. She denies headache nausea vomiting diaphoresis lower extremity edema. She denies abdominal pain dysuria hematuria frequency or urgency. She denies fever chills cough recent travel or sick contacts. She denies diarrhea constipation melena or bright  red blood per rectum. She does indicate that this is the one-year anniversary of the death of her husband and her son and she's feeling a little more anxious than usual.  Hospital Course:  Chest pain -likely anxiety related as this is the anniversary of her husband and son's death -Atypical symptoms that resolved prior to admission -Troponin cycled and unremarkable. EKG showed no acute changes.  -Lipid panel as below -Discussed possible follow-up with cardiology as an outpatient. Patient states she did see a cardiologist several years ago and will consider reestablishing care.  Hypokalemia -Possibly secondary to diuretics -Potassium upon admission 2.9, replaced -Current potassium 3.4, replaced -Patient should have repeat BMP in 1 week -Will discharge patient with low dose potassium supplementation  Essential Hypertension -continue HCTZ upon discharge  Hyperlipidemia -Patient was prescribed statin however does not take it due to side effects -Lipid panel: Total cholesterol 256, HDL 51, LDL 164, triglycerides 205 -Discussed the PCP possible alternatives to statin  Obesity -BMI 30 -Consider lifestyle modifications, discuss with primary care physician  Anxiety -Discussed possible therapy versus psychiatry with patient  Procedures: None  Consultations: None  Discharge Exam: Vitals:   05/15/17 0110 05/15/17 0603  BP: (!) 126/59 124/64  Pulse: 70 70  Resp: 18 18  Temp: 97.8 F (36.6 C) 98.1 F (36.7 C)   Feeling better. Currently denies any chest pain, respect some abdominal pain, nausea vomiting diarrhea constipation, dizziness or headache. Elliptical home. Patient states she will follow up with cardiology as an outpatient.   General: Well developed, well nourished, NAD, appears stated age  HEENT: NCAT, mucous membranes moist.  Cardiovascular: S1 S2 auscultated, no rubs, murmurs or gallops. Regular rate and rhythm.  Respiratory: Clear to auscultation bilaterally  with equal chest rise  Abdomen: Soft, nontender, nondistended, + bowel sounds  Extremities: warm dry without cyanosis clubbing or edema  Neuro: AAOx3, nonfocal  Skin: Without rashes exudates or nodules  Psych: Normal affect and demeanor with intact judgement and insight  Discharge Instructions Discharge Instructions    Discharge instructions    Complete by:  As directed    Patient will be discharged to home.  Patient will need to follow up with primary care provider within one week of discharge, repeat BMP and discuss cholesterol.  Consider cardiology follow up.  Patient should continue medications as prescribed.  Patient should follow a heart healthy diet.     Current Discharge Medication List    START taking these medications   Details  potassium chloride (K-DUR) 10 MEQ tablet Take 1 tablet (10 mEq total) by mouth daily. Qty: 10 tablet, Refills: 0      CONTINUE these medications which have NOT CHANGED   Details  Cholecalciferol (VITAMIN D3) 2000 units TABS Take 2,000 Units by mouth daily.    gabapentin (NEURONTIN) 300 MG capsule 1 by mouth at night for 7 days then 1 by mouth twice per day for 7 days then 1 by mouth three times per day. Qty: 180 capsule, Refills: 1    hydrochlorothiazide (HYDRODIURIL) 25 MG tablet TAKE 1 TABLET (25 MG TOTAL) BY MOUTH AT BEDTIME. Qty: 90 tablet, Refills: 2    fluticasone (FLONASE) 50 MCG/ACT nasal spray Place 1 spray into both nostrils as needed for allergies.     HYDROcodone-acetaminophen (NORCO) 5-325 MG tablet Take 1 tablet by mouth every 6 (six) hours as needed. Qty: 40 tablet, Refills: 0    pravastatin (PRAVACHOL) 20 MG tablet Take 1 tablet (20 mg total) by mouth daily. Qty: 90 tablet, Refills: 3    tiZANidine (ZANAFLEX) 4 MG tablet Take 1 tablet (4 mg total) by mouth every 6 (six) hours as needed for muscle spasms. Qty: 30 tablet, Refills: 3      STOP taking these medications     dextromethorphan-guaiFENesin (MUCINEX DM)  30-600 MG 12hr tablet        Allergies  Allergen Reactions  . Simvastatin Other (See Comments)    MYALGIAS  . Statins Other (See Comments)    Has severe leg muscle aches and cramps  . Bactrim [Sulfamethoxazole-Trimethoprim] Itching   Follow-up Information    Nicole Passy, MD. Schedule an appointment as soon as possible for a visit in 1 week(s).   Specialty:  Family Medicine Why:  Hospital follow up. Repeat lab work in one week. Discuss cholesterol management Contact information: [address] [place] [address] [phone]            The results of significant diagnostics from this hospitalization (including imaging, microbiology, ancillary and laboratory) are listed below for reference.    Significant Diagnostic Studies: Dg Chest 2 View  Result Date: 05/14/2017 CLINICAL DATA:  Chest pain EXAM: CHEST  2 VIEW COMPARISON:  03/08/2013 FINDINGS: Mild atherosclerotic calcification of the aortic arch. Heart size within normal limits. The lungs appear clear. Mild thoracic spondylosis. No pleural effusion. IMPRESSION: 1.  No active cardiopulmonary disease is radiographically apparent. 2.  Aortic Atherosclerosis (ICD10-I70.0). Electronically Signed   By: Van Clines M.D.   On: 05/14/2017 07:57    Microbiology: No results found for this or any previous visit (from the past 240 hour(s)).   Labs: Basic Metabolic Panel:  Recent Labs Lab 05/14/17 0724 05/15/17 0138  NA 139 139  K 2.8* 3.4*  CL 102  106  CO2 27 27  GLUCOSE 114* 108*  BUN 10 10  CREATININE 0.84 0.79  CALCIUM 9.3 8.8*  MG 2.0  --    Liver Function Tests:  Recent Labs Lab 05/14/17 0724  AST 25  ALT 16  ALKPHOS 77  BILITOT 0.5  PROT 6.9  ALBUMIN 3.9   No results for input(s): LIPASE, AMYLASE in the last 168 hours. No results for input(s): AMMONIA in the last 168 hours. CBC:  Recent Labs Lab 05/14/17 0724  WBC 6.1  NEUTROABS 3.4  HGB 14.7  HCT 43.7  MCV 88.3  PLT 354   Cardiac  Enzymes:  Recent Labs Lab 05/14/17 0930 05/14/17 1153 05/14/17 1417  TROPONINI <0.03 <0.03 <0.03   BNP: BNP (last 3 results) No results for input(s): BNP in the last 8760 hours.  ProBNP (last 3 results) No results for input(s): PROBNP in the last 8760 hours.  CBG: No results for input(s): GLUCAP in the last 168 hours.     SignedCristal Ford  [facility] 05/15/2017, 10:47 AM

## 2017-05-15 NOTE — Progress Notes (Signed)
Patiient slept, no acute events overnight. No complaints of pain. VS stable. Will continue to monitor.  Laray Corbit, RN

## 2017-05-15 NOTE — Progress Notes (Signed)
Nutrition Education Note  RD consulted for nutrition education regarding a Heart Healthy diet.   Lipid Panel     Component Value Date/Time   CHOL 256 (H) 05/14/2017 0724   TRIG 205 (H) 05/14/2017 0724   HDL 51 05/14/2017 0724   CHOLHDL 5.0 05/14/2017 0724   VLDL 41 (H) 05/14/2017 0724   LDLCALC 164 (H) 05/14/2017 0724   Pt reports an unintentional wt loss of 15 lbs related to depression after deaths in the family. States she "would like to keep the wt off, continue to lose, but do it the right way." Discussed eating consistently throughout the day, portion control, and making her plate balanced with all food groups.   RD provided "Heart Healthy Nutrition Therapy" handout from the Academy [employer]. Reviewed patient's dietary recall. Provided examples on ways to decrease sodium and fat intake in diet. Discouraged intake of processed foods and use of salt shaker. Encouraged fresh fruits and vegetables as well as whole grain sources of carbohydrates to maximize fiber intake. Teach back method used.  Expect great compliance.  Body mass index is 30.14 kg/m. Pt meets criteria for obese based on current BMI.  Current diet order is Heart Healthy, patient is consuming approximately 100% of meals at this time. Labs and medications reviewed. No further nutrition interventions warranted at this time. RD contact information provided. If additional nutrition issues arise, please re-consult RD.  Mariana Single RD, LDN Pager # - [phone]

## 2017-05-16 ENCOUNTER — Telehealth: Payer: Self-pay | Admitting: *Deleted

## 2017-05-16 NOTE — Telephone Encounter (Signed)
Transition Care Management Follow-up Telephone Call   Date discharged? 05/15/2017   How have you been since you were released from the hospital? "fine"   Do you understand why you were in the hospital? yes   Do you understand the discharge instructions? yes   Where were you discharged to? home   Items Reviewed:  Medications reviewed: n/a  Allergies reviewed: n/a  Dietary changes reviewed: n/a  Referrals reviewed: n/a  Pt states she was not needing to review any of the above listed items   Functional Questionnaire:   Activities of Daily Living (ADLs):   She states they are independent in the following: pt states she is independent in all areas    Any transportation issues/concerns?: no   Any patient concerns? no   Confirmed importance and date/time of follow-up visits scheduled yes  Provider Appointment booked with Dr Deborra Medina, 7/23 @ 1400  Confirmed with patient if condition begins to worsen call PCP or go to the ER.  Patient was given the office number and encouraged to call back with question or concerns.  : yes

## 2017-05-21 ENCOUNTER — Ambulatory Visit (INDEPENDENT_AMBULATORY_CARE_PROVIDER_SITE_OTHER): Payer: Medicare Other | Admitting: Family Medicine

## 2017-05-21 VITALS — BP 100/70 | HR 93 | Wt 181.0 lb

## 2017-05-21 DIAGNOSIS — R079 Chest pain, unspecified: Secondary | ICD-10-CM | POA: Diagnosis not present

## 2017-05-21 DIAGNOSIS — E785 Hyperlipidemia, unspecified: Secondary | ICD-10-CM

## 2017-05-21 DIAGNOSIS — E876 Hypokalemia: Secondary | ICD-10-CM

## 2017-05-21 DIAGNOSIS — I10 Essential (primary) hypertension: Secondary | ICD-10-CM | POA: Diagnosis not present

## 2017-05-21 LAB — BASIC METABOLIC PANEL
BUN: 14 mg/dL (ref 6–23)
CALCIUM: 10.1 mg/dL (ref 8.4–10.5)
CO2: 31 meq/L (ref 19–32)
CREATININE: 0.74 mg/dL (ref 0.40–1.20)
Chloride: 100 mEq/L (ref 96–112)
GFR: 83.14 mL/min (ref >60.00)
Glucose, Bld: 106 mg/dL — ABNORMAL HIGH (ref 70–99)
Potassium: 3.3 mEq/L — ABNORMAL LOW (ref 3.5–5.1)
SODIUM: 139 meq/L (ref 135–145)

## 2017-05-21 NOTE — Assessment & Plan Note (Signed)
She has not been taking pravachol regularly. She does agree to restart it every other day.

## 2017-05-21 NOTE — Progress Notes (Signed)
Subjective:   Patient ID: Nicole Daniels, female    DOB: [birth year], 66 y.o.   MRN: [id number]  Nicole Daniels is a pleasant 66 y.o. year old female who presents to clinic today with Hospitalization Follow-up (pravastatin causes pain )  on 05/21/2017  HPI:  Admitted 05/14/17 - 05/15/17 for Chest pain.  Notes reviewed.  Intermittent chest pain over 7 days- pressure, left anterior chest prior to presenting to ED. Symptoms were associated with dizziness and one episode of SOB.  No headache, nausea, vomiting or diaphoresis.  Unfortunately this it the anniversary of her husband and son's death.  Troponins neg, EKG- no acute changes.  Cholesterol elevated- has not been taking pravachol due to myalgias.  Had been started on pravachol due to myalgias with stronger statins.   Lab Results  Component Value Date   CHOL 256 (H) 05/14/2017   HDL 51 05/14/2017   LDLCALC 164 (H) 05/14/2017   LDLDIRECT 181.0 01/01/2017   TRIG 205 (H) 05/14/2017   CHOLHDL 5.0 05/14/2017    Hypokalemia- Potassium was 2.9 on admission, repleted and normalized at d/c. Started on low dose potassium.  Lab Results  Component Value Date   NA 139 05/15/2017   K 3.4 (L) 05/15/2017   CL 106 05/15/2017   CO2 27 05/15/2017    Current Outpatient Prescriptions on File Prior to Visit  Medication Sig Dispense Refill  . Cholecalciferol (VITAMIN D3) 2000 units TABS Take 2,000 Units by mouth daily.    . fluticasone (FLONASE) 50 MCG/ACT nasal spray Place 1 spray into both nostrils as needed for allergies.     Marland Kitchen gabapentin (NEURONTIN) 300 MG capsule 1 by mouth at night for 7 days then 1 by mouth twice per day for 7 days then 1 by mouth three times per day. (Patient taking differently: Take 300-600 mg by mouth See admin instructions. Take 300mg  in the morning, and 600mg  at night) 180 capsule 1  . hydrochlorothiazide (HYDRODIURIL) 25 MG tablet TAKE 1 TABLET (25 MG TOTAL) BY MOUTH AT BEDTIME. (Patient taking  differently: TAKE 1 TABLET (25 MG TOTAL) BY MOUTH IN THE MORNING.) 90 tablet 2  . HYDROcodone-acetaminophen (NORCO) 5-325 MG tablet Take 1 tablet by mouth every 6 (six) hours as needed. (Patient taking differently: Take 1 tablet by mouth every 6 (six) hours as needed. For pain.) 40 tablet 0  . potassium chloride (K-DUR) 10 MEQ tablet Take 1 tablet (10 mEq total) by mouth daily. 10 tablet 0  . pravastatin (PRAVACHOL) 20 MG tablet Take 1 tablet (20 mg total) by mouth daily. 90 tablet 3  . tiZANidine (ZANAFLEX) 4 MG tablet Take 1 tablet (4 mg total) by mouth every 6 (six) hours as needed for muscle spasms. 30 tablet 3   No current facility-administered medications on file prior to visit.     Allergies  Allergen Reactions  . Simvastatin Other (See Comments)    MYALGIAS  . Statins Other (See Comments)    Has severe leg muscle aches and cramps  . Bactrim [Sulfamethoxazole-Trimethoprim] Itching    Past Medical History:  Diagnosis Date  . Abdominal pain, right lower quadrant   . Acquired cyst of kidney 01/08/2012   pt denies history of   . Acute sinusitis, unspecified   . Adenomatous colon polyp   . Angiomyolipoma    kidney  . Chest pain   . Chest pain, unspecified   . Depressive disorder, not elsewhere classified 01/08/2012   not currently   . Diverticulosis   .  Dizziness and giddiness   . Dysfunction of eustachian tube   . Hemorrhoids   . History of diverticulitis of colon   . IBS (irritable bowel syndrome)   . Liver lesion 2011  . Neoplasm of skin of face    malignant  . OA (osteoarthritis)    knee  . Obesity, unspecified   . Ostium secundum type atrial septal defect   . Other and unspecified hyperlipidemia   . Other malaise and fatigue   . Palpitations   . Shortness of breath   . Unspecified essential hypertension    pt states she does not have HTN  . Unspecified sinusitis (chronic)   . Unspecified vitamin D deficiency     Past Surgical History:  Procedure  Laterality Date  . ABDOMINAL HYSTERECTOMY    . BREAST REDUCTION SURGERY  1990  . CHOLECYSTECTOMY    . COLONOSCOPY    . DILATION AND CURETTAGE OF UTERUS    . FLEXIBLE SIGMOIDOSCOPY  01/08/2012   Procedure: FLEXIBLE SIGMOIDOSCOPY;  Surgeon: Lafayette Dragon, MD;  Location: [facility];  Service: Endoscopy;  Laterality: N/A;  . HEMORRHOID SURGERY    . KNEE ARTHROTOMY     pt states no   . LAMINECTOMY N/A 01/31/2017   Procedure: Thoracic Laminectomy and marsupialization of arachnoid cyst;  Surgeon: Jovita Gamma, MD;  Location: [place];  Service: Neurosurgery;  Laterality: N/A;  . LEFT OOPHORECTOMY  1976  . PARTIAL HYSTERECTOMY     right ovary remains  . TONSILLECTOMY  1972    Family History  Problem Relation Age of Onset  . Cirrhosis Father   . Heart disease Father   . Hypertension Mother   . Diabetes Mother   . Breast cancer Cousin   . Hypertension Sister   . Diabetes Cousin   . Diabetes Unknown        aunt  . Stroke Unknown        aunt  . Heart disease Brother        x 2  . Colon cancer Neg Hx     Social History   Social History  . Marital status: Married    Spouse name: N/A  . Number of children: 2  . Years of education: N/A   Occupational History  . Bus Router for [employer] History Main Topics  . 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 Smokeless tobacco: Never Used  . Alcohol use No  . Drug use: No  . Sexual activity: No   Other Topics Concern  . Not on file   Social History Narrative   Daily caffeine    The PMH, PSH, Social History, Family History, Medications, and allergies have been reviewed in [facility], and have been updated if relevant.   Review of Systems  Constitutional: Negative.   HENT: Negative.   Eyes: Negative.   Respiratory: Negative.   Cardiovascular: Negative.   Gastrointestinal: Negative.   Endocrine: Negative.   Genitourinary: Negative.   Musculoskeletal: Positive for myalgias.  Allergic/Immunologic: Negative.   Neurological:  Negative.   Hematological: Negative.   Psychiatric/Behavioral: Positive for sleep disturbance. Negative for dysphoric mood and suicidal ideas. The patient is nervous/anxious.   All other systems reviewed and are negative.      Objective:    BP 100/70   Pulse 93   Wt 181 lb (82.1 kg)   SpO2 98%   BMI 30.12 kg/m    Physical Exam   General:  Well-developed,well-nourished,in no acute distress; alert,appropriate and  cooperative throughout examination Head:  normocephalic and atraumatic.   Eyes:  vision grossly intact, PERRL Ears:  R ear normal and L ear normal externally, TMs clear bilaterally Nose:  no external deformity.   Mouth:  good dentition.   Neck:  No deformities, masses, or tenderness noted. Lungs:  Normal respiratory effort, chest expands symmetrically. Lungs are clear to auscultation, no crackles or wheezes. Heart:  Normal rate and regular rhythm. S1 and S2 normal without gallop, murmur, click, rub or other extra sounds. Abdomen:  Bowel sounds positive,abdomen soft and non-tender without masses, organomegaly or hernias noted. Msk:  No deformity or scoliosis noted of thoracic or lumbar spine.   Extremities:  No clubbing, cyanosis, edema, or deformity noted with normal full range of motion of all joints.   Neurologic:  alert & oriented X3 and gait normal.   Skin:  Intact without suspicious lesions or rashes Psych:  Cognition and judgment appear intact. Alert and cooperative with normal attention span and concentration. No apparent delusions, illusions, hallucinations       Assessment & Plan:   Hyperlipidemia, unspecified hyperlipidemia type  Chest pain, unspecified type No Follow-up on file.

## 2017-05-21 NOTE — Assessment & Plan Note (Signed)
Continue potassium. Check level today.

## 2017-05-21 NOTE — Assessment & Plan Note (Addendum)
Resolved. Troponin neg, EKG reassuring. Atypical in nature, likely due to the stress of the anniversary of the death of her son and husband. She is not wanting any rx or referral for treatment of anxiety related to grief.

## 2017-05-21 NOTE — Assessment & Plan Note (Signed)
Well controlled.  No changes made. 

## 2017-05-21 NOTE — Addendum Note (Signed)
Addended by: Ellamae Sia on: 05/21/2017 02:35 PM   Modules accepted: Orders

## 2017-06-28 ENCOUNTER — Other Ambulatory Visit: Payer: Self-pay | Admitting: Family Medicine

## 2017-06-28 ENCOUNTER — Telehealth: Payer: Self-pay | Admitting: Family Medicine

## 2017-06-28 DIAGNOSIS — Z1231 Encounter for screening mammogram for malignant neoplasm of breast: Secondary | ICD-10-CM

## 2017-06-28 NOTE — Telephone Encounter (Signed)
[facility] Patient Name: Nicole Daniels DOB: [birth year] Initial Comment Caller states she is having pain under her shoulder on her back. Nurse Assessment Nurse: Joline Salt, RN, Malachy Mood Date/Time Eilene Ghazi Time): 06/28/2017 4:53:01 PM Confirm and document reason for call. If symptomatic, describe symptoms. ---Caller states she is having pain under her right shoulder blade on her back. It's a constant ache. It's been going on for about a month. She had a cyst on her spine and had surgery in April. Does the patient have any new or worsening symptoms? ---Yes Will a triage be completed? ---Yes Related visit to physician within the last 2 weeks? ---No Does the PT have any chronic conditions? (i.e. diabetes, asthma, etc.) ---No Is this a behavioral health or substance abuse call? ---No Guidelines Guideline Title Affirmed Question Affirmed Notes Shoulder Pain [1] MODERATE pain (e.g., interferes with normal activities) AND [2] present > 3 days Final Disposition User See PCP When Office is Open (within 3 days) Joline Salt, RN, [employer] will make appointment with her neurologist. Disagree/Comply: Comply Call Id: [id number]

## 2017-07-10 ENCOUNTER — Ambulatory Visit
Admission: RE | Admit: 2017-07-10 | Discharge: 2017-07-10 | Disposition: A | Discharge status: Home / Self Care | Payer: Medicare Other | Source: Ambulatory Visit | Attending: Family Medicine | Admitting: Family Medicine

## 2017-07-10 DIAGNOSIS — Z1231 Encounter for screening mammogram for malignant neoplasm of breast: Secondary | ICD-10-CM

## 2017-07-30 HISTORY — PX: EYE SURGERY: SHX253

## 2017-08-10 ENCOUNTER — Other Ambulatory Visit: Payer: Self-pay | Admitting: Family Medicine

## 2017-10-18 ENCOUNTER — Encounter: Payer: Self-pay | Admitting: Family Medicine

## 2017-10-18 ENCOUNTER — Ambulatory Visit: Payer: Medicare Other | Admitting: Family Medicine

## 2017-10-18 ENCOUNTER — Ambulatory Visit: Payer: Self-pay | Admitting: *Deleted

## 2017-10-18 VITALS — BP 128/72 | HR 86 | Temp 99.3°F | Ht 65.0 in | Wt 176.0 lb

## 2017-10-18 DIAGNOSIS — J069 Acute upper respiratory infection, unspecified: Secondary | ICD-10-CM

## 2017-10-18 DIAGNOSIS — K118 Other diseases of salivary glands: Secondary | ICD-10-CM

## 2017-10-18 DIAGNOSIS — R6884 Jaw pain: Secondary | ICD-10-CM | POA: Diagnosis not present

## 2017-10-18 MED ORDER — CEPHALEXIN 500 MG PO CAPS: 500.0000 mg | ORAL_CAPSULE | Freq: Three times a day (TID) | ORAL | 0 refills | Status: DC

## 2017-10-18 NOTE — Telephone Encounter (Signed)
   Reason for Disposition . [1] SEVERE pain AND [2] not improved 2 hours after taking analgesic medication (e.g., ibuprofen or acetaminophen)  Answer Assessment - Initial Assessment Questions 1. LOCATION: "Which ear is involved?"     Right side and gland is tender 2. ONSET: "When did the ear start hurting"      yesterday gland has been sore for about a week 3. SEVERITY: "How bad is the pain?"  (Scale 1-10; mild, moderate or severe)   - MILD (1-3): doesn't interfere with normal activities    - MODERATE (4-7): interferes with normal activities or awakens from sleep    - SEVERE (8-10): excruciating pain, unable to do any normal activities      7 4. URI SYMPTOMS: " Do you have a runny nose or cough?"     Runny nose and slight cough 5. FEVER: "Do you have a fever?" If so, ask: "What is your temperature, how was it measured, and when did it start?"     101.7 yesterday oral thermometer 6. CAUSE: "Have you been swimming recently?", "How often do you use Q-TIPS?", "Have you had any recent air travel or scuba diving?"     Sinus infection 7. OTHER SYMPTOMS: "Do you have any other symptoms?" (e.g., headache, stiff neck, dizziness, vomiting, runny nose, decreased hearing)     Sore throat, nausea, achy 8. PREGNANCY: "Is there any chance you are pregnant?" "When was your last menstrual period?"     n/a    Patient is calling with complaints of sore throat, ear ache and fever.  Protocols used: EARACHE-A-AH

## 2017-10-18 NOTE — Progress Notes (Signed)
Nicole Daniels - 66 y.o. female MRN [id number]  Date of birth: May 08, 1951  SUBJECTIVE:  Including CC & ROS.  Chief Complaint  Patient presents with  . Sore Throat    Nicole Daniels is a 66 y.o. female that is presenting with sore throat, congestion, dizziness, sore  Glands and bilateral ear pain.  Symptoms started yesterday morning. Admits to body aches and fevers. Patient took an over the counter allergy medication with no improvement. Fever of 101.7. Has been around her grandchildren who have had infections. Coughing up some mucous. No rashes. Does not take the flu vaccine.     Review of Systems  Constitutional: Positive for chills and fever.  HENT: Positive for ear pain and sinus pressure.   Respiratory: Positive for cough. Negative for shortness of breath.   Cardiovascular: Negative for chest pain.    HISTORY: Past Medical, Surgical, Social, and Family History Reviewed & Updated per EMR.   Pertinent Historical Findings include:  Past Medical History:  Diagnosis Date  . Abdominal pain, right lower quadrant   . Acquired cyst of kidney 01/08/2012   pt denies history of   . Acute sinusitis, unspecified   . Adenomatous colon polyp   . Angiomyolipoma    kidney  . Chest pain   . Chest pain, unspecified   . Depressive disorder, not elsewhere classified 01/08/2012   not currently   . Diverticulosis   . Dizziness and giddiness   . Dysfunction of eustachian tube   . Hemorrhoids   . History of diverticulitis of colon   . IBS (irritable bowel syndrome)   . Liver lesion 2011  . Neoplasm of skin of face    malignant  . OA (osteoarthritis)    knee  . Obesity, unspecified   . Ostium secundum type atrial septal defect   . Other and unspecified hyperlipidemia   . Other malaise and fatigue   . Palpitations   . Shortness of breath   . Unspecified essential hypertension    pt states she does not have HTN  . Unspecified sinusitis (chronic)   . Unspecified vitamin D  deficiency     Past Surgical History:  Procedure Laterality Date  . ABDOMINAL HYSTERECTOMY    . BREAST REDUCTION SURGERY  1990  . CHOLECYSTECTOMY    . COLONOSCOPY    . DILATION AND CURETTAGE OF UTERUS    . FLEXIBLE SIGMOIDOSCOPY  01/08/2012   Procedure: FLEXIBLE SIGMOIDOSCOPY;  Surgeon: Lafayette Dragon, MD;  Location: [facility];  Service: Endoscopy;  Laterality: N/A;  . HEMORRHOID SURGERY    . KNEE ARTHROTOMY     pt states no   . LAMINECTOMY N/A 01/31/2017   Procedure: Thoracic Laminectomy and marsupialization of arachnoid cyst;  Surgeon: Jovita Gamma, MD;  Location: [place];  Service: Neurosurgery;  Laterality: N/A;  . LEFT OOPHORECTOMY  1976  . PARTIAL HYSTERECTOMY     right ovary remains  . TONSILLECTOMY  1972    Allergies  Allergen Reactions  . Simvastatin Other (See Comments)    MYALGIAS  . Statins Other (See Comments)    Has severe leg muscle aches and cramps  . Bactrim [Sulfamethoxazole-Trimethoprim] Itching    Family History  Problem Relation Age of Onset  . Cirrhosis Father   . Heart disease Father   . Hypertension Mother   . Diabetes Mother   . Breast cancer Cousin   . Hypertension Sister   . Diabetes Cousin   . Diabetes Unknown  aunt  . Stroke Unknown        aunt  . Heart disease Brother        x 2  . Colon cancer Neg Hx      Social History   Socioeconomic History  . Marital status: Married    Spouse name: Not on file  . Number of children: 2  . Years of education: Not on file  . Highest education level: Not on file  Social Needs  . Financial resource strain: Not on file  . Food insecurity - worry: Not on file  . Food insecurity - inability: Not on file  . Transportation needs - medical: Not on file  . Transportation needs - non-medical: Not on file  Occupational History  . 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 Smokeless tobacco: Never Used  Substance and Sexual Activity  .  Alcohol use: No  . Drug use: No  . Sexual activity: No  Other Topics Concern  . Not on file  Social History Narrative   Daily caffeine      PHYSICAL EXAM:  VS: BP 128/72 (BP Location: Left Arm, Patient Position: Sitting, Cuff Size: Normal)   Pulse 86   Temp 99.3 F (37.4 C) (Oral)   Ht 5\' 5"  (1.651 m)   Wt 176 lb (79.8 kg)   SpO2 99%   BMI 29.29 kg/m  Physical Exam Gen: NAD, alert, cooperative with exam,  ENT: normal lips, normal nasal mucosa, tympanic membranes clear and intact bilaterally, normal oropharynx, no cervical lymphadenopathy, TTP of the left submandibular gland, no expression of pus, no hardness palpated within the gland Eye: normal EOM, normal conjunctiva and lids CV:  no edema, +2 pedal pulses, regular rate and rhythm, S1-S2   Resp: no accessory muscle use, non-labored, clear to auscultation bilaterally, no crackles or wheezes GI: no masses or tenderness, no hernia  Skin: no rashes, no areas of induration  Neuro: normal tone, normal sensation to touch Psych:  normal insight, alert and oriented MSK: Normal gait, normal strength       ASSESSMENT & PLAN:   Upper respiratory tract infection Likely viral in nature.  - counseled on supportive care  Pain of submandibular gland Doesn't appear to be infection but has fever and pain to palpation. No expression of pus.  - keflex provided if her symptoms don't improve.  - given indications to return

## 2017-10-18 NOTE — Assessment & Plan Note (Signed)
Likely viral in nature.  - counseled on supportive care

## 2017-10-18 NOTE — Assessment & Plan Note (Signed)
Doesn't appear to be infection but has fever and pain to palpation. No expression of pus.  - keflex provided if her symptoms don't improve.  - given indications to return

## 2017-10-18 NOTE — Patient Instructions (Signed)
Please try things such as zyrtec-D or allegra-D which is an antihistamine and decongestant.   Please try afrin which will help with nasal congestion but use for only three days.   Please also try using a netti pot on a regular occasion.  Honey can help with a sore throat.   Delsym can be used for cough.

## 2017-11-20 ENCOUNTER — Ambulatory Visit: Payer: Self-pay | Admitting: *Deleted

## 2017-11-20 NOTE — Telephone Encounter (Signed)
Called in c/o having indigestion and belching a lot lately.   This happens when I have a panic attack but it's also happening at other times too. While she was talking to me she started crying and got choked up for a moment.  I told her to take her time.   She then informed me of the passing of her husband and son 18 months ago within 13 days of each other.   "I've been having a lot of panic attacks lately."   "Today is my son's birthday".   I allowed her to talk for a few minutes.  She calmed down and wasn't crying by the time we got off the phone.  She did say,  "I'm just concerned about my health and this indigestion and belching". I made her an app for tomorrow morning at 8:30am with Dr. Deborra Medina.  I gave her the address of the office (this is her first time seeing Dr. Deborra Medina at her new office at [facility]).     Reason for Disposition . [1] Symptoms of anxiety or panic attack AND [2] is a chronic symptom (recurrent or ongoing AND present > 4 weeks)  Answer Assessment - Initial Assessment Questions 1. CONCERN: "What happened that made you call today?"     My son and husband passed away 18 months ago within 13 days of each other.  I've been having panic attacks lately.    Today is my son's birthday. 2. ANXIETY SYMPTOM SCREENING: "Can you describe how you have been feeling?"  (e.g., tense, restless, panicky, anxious, keyed up, trouble sleeping, trouble concentrating)  I'm feeling light headed, nausea and beltching a lot.     I've been having these attacks for weeks.   It's happening more and more.  3. ONSET: "How long have you been feeling this way?"     Several weeks  I've had indigestion real bad. 4. RECURRENT: "Have you felt this way before?"  If yes: "What happened that time?" "What helped these feelings go away in the past?"      Yes.  5. RISK OF HARM - SUICIDAL IDEATION:  "Do you ever have thoughts of hurting or killing yourself?"  (e.g., yes, no, no but preoccupation with thoughts about death)  - INTENT:  "Do you have thoughts of hurting or killing yourself right NOW?" (e.g., yes, no, N/A)   - PLAN: "Do you have a specific plan for how you would do this?" (e.g., gun, knife, overdose, no plan, N/A)     No 6. RISK OF HARM - HOMICIDAL IDEATION:  "Do you ever have thoughts of hurting or killing someone else?"  (e.g., yes, no, no but preoccupation with thoughts about death)   - INTENT:  "Do you have thoughts of hurting or killing someone right NOW?" (e.g., yes, no, N/A)   - PLAN: "Do you have a specific plan for how you would do this?" (e.g., gun, knife, no plan, N/A)      No 7. FUNCTIONAL IMPAIRMENT: "How have things been going for you overall in your life? Have you had any more difficulties than usual doing your normal daily activities?"  (e.g., better, same, worse; self-care, school, work, interactions)     I met a friend of my husband's as a long time friend.   8. SUPPORT: "Who is with you now?" "Who do you live with?" "Do you have family or friends nearby who you can talk to?"      I'm wanting to make sure health wise  I'm ok. 9. THERAPIST: "Do you have a counselor or therapist? Name?"     No 10. STRESSORS: "Has there been any new stress or recent changes in your life?"       My son's birthday. 11. CAFFEINE ABUSE: "Do you drink caffeinated beverages, and how much each day?" (e.g., coffee, tea, colas)       I might have a Pepsi a day.   I drink tea sometimes.   12. SUBSTANCE ABUSE: "Do you use any illegal drugs or alcohol?"       No 13. OTHER SYMPTOMS: "Do you have any other physical symptoms right now?" (e.g., chest pain, palpitations, difficulty breathing, fever)       Chest tightness with the panic attack.  If I walk around and beltch it relieves it. 14. PREGNANCY: "Is there any chance you are pregnant?" "When was your last menstrual period?"       N/A  Protocols used: ANXIETY AND PANIC ATTACK-A-AH

## 2017-11-21 ENCOUNTER — Ambulatory Visit: Payer: Medicare Other | Admitting: Family Medicine

## 2017-11-21 ENCOUNTER — Encounter: Payer: Self-pay | Admitting: Family Medicine

## 2017-11-21 VITALS — BP 142/84 | HR 81 | Temp 98.2°F | Ht 65.0 in | Wt 177.4 lb

## 2017-11-21 DIAGNOSIS — K219 Gastro-esophageal reflux disease without esophagitis: Secondary | ICD-10-CM | POA: Diagnosis not present

## 2017-11-21 DIAGNOSIS — F4329 Adjustment disorder with other symptoms: Secondary | ICD-10-CM | POA: Insufficient documentation

## 2017-11-21 DIAGNOSIS — Z634 Disappearance and death of family member: Secondary | ICD-10-CM

## 2017-11-21 DIAGNOSIS — F4321 Adjustment disorder with depressed mood: Secondary | ICD-10-CM | POA: Insufficient documentation

## 2017-11-21 NOTE — Progress Notes (Signed)
Subjective:   Patient ID: Nicole Daniels, female    DOB: August 04, 1951, 67 y.o.   MRN: [id number]  BUFFEY ZABINSKI is a pleasant 67 y.o. year old female who presents to clinic today with Gastroesophageal Reflux (Patient is here today C/O indigestions x2wks.  The past week it has become worse.  Has been Tx with Tums but Sx persist.) and Grief Response (Patient is mainly here due to problems with Grief.  Plz see PHQ-9 & GAD-7.)  on 11/21/2017  HPI:  Please see note entered below yesterday from a triage nurse-   Called in c/o having indigestion and belching a lot lately.   This happens when I have a panic attack but it's also happening at other times too. While she was talking to me she started crying and got choked up for a moment.  I told her to take her time.   She then informed me of the passing of her husband and son 18 months ago within 13 days of each other.   "I've been having a lot of panic attacks lately."   "Today is my son's birthday".   I allowed her to talk for a few minutes.  She calmed down and wasn't crying by the time we got off the phone.  She did say,  "I'm just concerned about my health and this indigestion and belching". I made her an app for tomorrow morning at 8:30am with Dr. Deborra Medina.  I gave her the address of the office (this is her first time seeing Dr. Deborra Medina at her new office at [facility]).    Current Outpatient Medications on File Prior to Visit  Medication Sig Dispense Refill  . fluticasone (FLONASE) 50 MCG/ACT nasal spray Place 1 spray into both nostrils as needed for allergies.     Marland Kitchen gabapentin (NEURONTIN) 300 MG capsule 1 by mouth at night for 7 days then 1 by mouth twice per day for 7 days then 1 by mouth three times per day. (Patient taking differently: Take 300-600 mg by mouth See admin instructions. Take 300mg  in the morning, and 600mg  at night) 180 capsule 1  . hydrochlorothiazide (HYDRODIURIL) 25 MG tablet TAKE 1 TABLET (25 MG TOTAL) BY MOUTH AT BEDTIME. 90  tablet 2  . Cholecalciferol (VITAMIN D3) 2000 units TABS Take 2,000 Units by mouth daily.    Marland Kitchen tiZANidine (ZANAFLEX) 4 MG tablet Take 1 tablet (4 mg total) by mouth every 6 (six) hours as needed for muscle spasms. (Patient not taking: Reported on 11/21/2017) 30 tablet 3   No current facility-administered medications on file prior to visit.     Allergies  Allergen Reactions  . Simvastatin Other (See Comments)    MYALGIAS  . Statins Other (See Comments)    Has severe leg muscle aches and cramps  . Bactrim [Sulfamethoxazole-Trimethoprim] Itching    Past Medical History:  Diagnosis Date  . Abdominal pain, right lower quadrant   . Acquired cyst of kidney 01/08/2012   pt denies history of   . Acute sinusitis, unspecified   . Adenomatous colon polyp   . Angiomyolipoma    kidney  . Chest pain   . Chest pain, unspecified   . Depressive disorder, not elsewhere classified 01/08/2012   not currently   . Diverticulosis   . Dizziness and giddiness   . Dysfunction of eustachian tube   . Hemorrhoids   . History of diverticulitis of colon   . IBS (irritable bowel syndrome)   . Liver lesion 2011  .  Neoplasm of skin of face    malignant  . OA (osteoarthritis)    knee  . Obesity, unspecified   . Ostium secundum type atrial septal defect   . Other and unspecified hyperlipidemia   . Other malaise and fatigue   . Palpitations   . Shortness of breath   . Unspecified essential hypertension    pt states she does not have HTN  . Unspecified sinusitis (chronic)   . Unspecified vitamin D deficiency     Past Surgical History:  Procedure Laterality Date  . ABDOMINAL HYSTERECTOMY    . BREAST REDUCTION SURGERY  1990  . CHOLECYSTECTOMY    . COLONOSCOPY    . DILATION AND CURETTAGE OF UTERUS    . FLEXIBLE SIGMOIDOSCOPY  01/08/2012   Procedure: FLEXIBLE SIGMOIDOSCOPY;  Surgeon: Lafayette Dragon, MD;  Location: [facility];  Service: Endoscopy;  Laterality: N/A;  . HEMORRHOID SURGERY    . KNEE  ARTHROTOMY     pt states no   . LAMINECTOMY N/A 01/31/2017   Procedure: Thoracic Laminectomy and marsupialization of arachnoid cyst;  Surgeon: Jovita Gamma, MD;  Location: [place];  Service: Neurosurgery;  Laterality: N/A;  . LEFT OOPHORECTOMY  1976  . PARTIAL HYSTERECTOMY     right ovary remains  . TONSILLECTOMY  1972    Family History  Problem Relation Age of Onset  . Cirrhosis Father   . Heart disease Father   . Hypertension Mother   . Diabetes Mother   . Breast cancer Cousin   . Hypertension Sister   . Diabetes Cousin   . Diabetes Unknown        aunt  . Stroke Unknown        aunt  . Heart disease Brother        x 2  . Colon cancer Neg Hx     Social History   Socioeconomic History  . Marital status: Married    Spouse name: Not on file  . Number of children: 2  . Years of education: Not on file  . Highest education level: Not on file  Social Needs  . Financial resource strain: Not on file  . Food insecurity - worry: Not on file  . Food insecurity - inability: Not on file  . Transportation needs - medical: Not on file  . Transportation needs - non-medical: Not on file  Occupational History  . 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 Smokeless tobacco: Never Used  Substance and Sexual Activity  . Alcohol use: No  . Drug use: No  . Sexual activity: No  Other Topics Concern  . Not on file  Social History Narrative   Daily caffeine    The PMH, PSH, Social History, Family History, Medications, and allergies have been reviewed in [facility], and have been updated if relevant.  Review of Systems  Gastrointestinal: Positive for abdominal distention. Negative for abdominal pain, anal bleeding, blood in stool, constipation, diarrhea, nausea, rectal pain and vomiting.  Psychiatric/Behavioral: Positive for dysphoric mood. Negative for agitation, behavioral problems, confusion, decreased concentration, hallucinations,  self-injury, sleep disturbance and suicidal ideas. The patient is nervous/anxious. The patient is not hyperactive.   All other systems reviewed and are negative.      Objective:    BP (!) 142/84 (BP Location: Left Arm, Patient Position: Sitting, Cuff Size: Normal)   Pulse 81   Temp 98.2 F (36.8 C) (Oral)   Ht 5\' 5"  (1.651  m)   Wt 177 lb 6.4 oz (80.5 kg)   SpO2 95%   BMI 29.52 kg/m    Physical Exam  Constitutional: She is oriented to person, place, and time. She appears well-developed and well-nourished. No distress.  HENT:  Head: Normocephalic and atraumatic.  Eyes: Conjunctivae are normal.  Neck: Normal range of motion.  Cardiovascular: Normal rate.  Pulmonary/Chest: Effort normal.  Abdominal: Soft.  Musculoskeletal: Normal range of motion.  Neurological: She is alert and oriented to person, place, and time. No cranial nerve deficit.  Skin: Skin is warm and dry. She is not diaphoretic.  Psychiatric: She has a normal mood and affect. Her behavior is normal. Judgment and thought content normal.  Nursing note and vitals reviewed.         Assessment & Plan:   Gastroesophageal reflux disease, esophagitis presence not specified  Complicated grief No Follow-up on file.

## 2017-11-21 NOTE — Assessment & Plan Note (Addendum)
>  25 minutes spent in face to face time with patient, >50% spent in counselling or coordination of care discussing grief reaction and GERD.  Deteriorated understandably, son's birthday was yesterday. PHQ 9 however of 4 and she said she feels much better today. She already contacted her counselor and does not want rx for either complaint at this time. Call or return to clinic prn if these symptoms worsen or fail to improve as anticipated. The patient indicates understanding of these issues and agrees with the plan.

## 2017-11-21 NOTE — Patient Instructions (Signed)
Great to see you. You are so strong. Please keep me updated.

## 2018-02-01 ENCOUNTER — Telehealth: Payer: Self-pay | Admitting: Family Medicine

## 2018-02-01 MED ORDER — SERTRALINE HCL 25 MG PO TABS: 25.0000 mg | ORAL_TABLET | Freq: Every day | ORAL | 2 refills | Status: DC

## 2018-02-01 NOTE — Telephone Encounter (Signed)
TA-Plz see pt phone note/states that she would like to get something to take the edge off like was offered at the visit but didn't feel she needed at that time/she now feels like she needs it/plz advise what you would like for me to send in for her/thx dmf

## 2018-02-01 NOTE — Telephone Encounter (Signed)
Sent in Rx for Zoloft 25mg  1qd #30+2 refills per TA/LMOVM stating that she can check with the pharmacy a little later/PEC can give this information if she calls/thx dmf

## 2018-02-01 NOTE — Telephone Encounter (Signed)
Yes I feel she would benefit from low dose zoloft- 25 mg daily.  Okay to send in 30 tablets with 2 refills if she agrees with this.

## 2018-02-01 NOTE — Telephone Encounter (Signed)
Copied from [place] [id number]. Topic: Quick Communication - See Telephone Encounter >> Feb 01, 2018 11:15 AM Bea Graff, NT wrote: CRM for notification. See Telephone encounter for: 02/01/18. Pt states she was seen a few weeks ago and Dr. Deborra Medina stated that she could just call the office if she would like something called in to "take the edge off" of what she is going through. She states she would like something mild to be called in to help her. Uses [facility] in [place].

## 2018-02-14 NOTE — Progress Notes (Signed)
Subjective:   Nicole Daniels is a 67 y.o. female who presents for an Initial Medicare Annual Wellness Visit.  Review of Systems    No ROS.  Medicare Wellness Visit. Additional risk factors are reflected in the social history. Cardiac Risk Factors include: advanced age (>73men, >33 women);dyslipidemia;hypertension Sleep patterns: Sleeps well. Takes gabapentin at night.  Home Safety/Smoke Alarms: Feels safe in home. Smoke alarms in place.  Living environment; residence and Firearm Safety: 1 story home with laundry in the basement. Lives alone.   Female:    Mammo-utd       Dexa scan- pt states she will schedule with next mammogram.       CCS- pt declines.  Objective:    Today's Vitals   02/20/18 0941  BP: 138/74  Pulse: 75  SpO2: 97%  Weight: 182 lb 3.2 oz (82.6 kg)  Height: 5\' 5"  (1.651 m)   Body mass index is 30.32 kg/m.  Advanced Directives 02/20/2018 05/14/2017 02/01/2017 02/01/2017 01/23/2017 01/01/2017 03/08/2013  Does Patient Have a Medical Advance Directive? Yes Yes Yes Yes Yes Yes Patient does not have advance directive;Patient would not like information  Type of Scientist, forensic Power of [place];Living will Living will [place];Living will [place];Living will [place];Living will [place];Living will -  Does patient want to make changes to medical advance directive? No - Patient declined No - Patient declined Yes (Inpatient - patient requests chaplain consult to change a medical advance directive) Yes (Inpatient - patient requests chaplain consult to change a medical advance directive) Yes (Inpatient - patient requests chaplain consult to change a medical advance directive) - -  Copy of [place] in Chart? No - copy requested - Yes No - copy requested No - copy requested No - copy requested -  Pre-existing out of facility DNR order (yellow form or pink MOST form) -  - - - - - No    Current Medications (verified) Outpatient Encounter Medications as of 02/20/2018  Medication Sig  . fluticasone (FLONASE) 50 MCG/ACT nasal spray Place 1 spray into both nostrils as needed for allergies.   Marland Kitchen gabapentin (NEURONTIN) 300 MG capsule 1 by mouth at night for 7 days then 1 by mouth twice per day for 7 days then 1 by mouth three times per day. (Patient taking differently: Take 300-600 mg by mouth See admin instructions. Take 300mg  in the morning, and 600mg  at night)  . hydrochlorothiazide (HYDRODIURIL) 25 MG tablet TAKE 1 TABLET (25 MG TOTAL) BY MOUTH AT BEDTIME.  Marland Kitchen Cholecalciferol (VITAMIN D3) 2000 units TABS Take 2,000 Units by mouth daily.  . sertraline (ZOLOFT) 25 MG tablet Take 1 tablet (25 mg total) by mouth daily. (Patient not taking: Reported on 02/20/2018)  . tiZANidine (ZANAFLEX) 4 MG tablet Take 1 tablet (4 mg total) by mouth every 6 (six) hours as needed for muscle spasms. (Patient not taking: Reported on 02/20/2018)   No facility-administered encounter medications on file as of 02/20/2018.     Allergies (verified) Simvastatin; Statins; and Bactrim [sulfamethoxazole-trimethoprim]   History: Past Medical History:  Diagnosis Date  . Abdominal pain, right lower quadrant   . Acquired cyst of kidney 01/08/2012   pt denies history of   . Acute sinusitis, unspecified   . Adenomatous colon polyp   . Angiomyolipoma    kidney  . Chest pain   . Chest pain, unspecified   . Depressive disorder, not elsewhere classified 01/08/2012  not currently   . Diverticulosis   . Dizziness and giddiness   . Dysfunction of eustachian tube   . Hemorrhoids   . History of diverticulitis of colon   . IBS (irritable bowel syndrome)   . Liver lesion 2011  . Neoplasm of skin of face    malignant  . OA (osteoarthritis)    knee  . Obesity, unspecified   . Ostium secundum type atrial septal defect   . Other and unspecified hyperlipidemia   . Other malaise and fatigue   .  Palpitations   . Shortness of breath   . Unspecified essential hypertension    pt states she does not have HTN  . Unspecified sinusitis (chronic)   . Unspecified vitamin D deficiency    Past Surgical History:  Procedure Laterality Date  . ABDOMINAL HYSTERECTOMY    . BREAST REDUCTION SURGERY  1990  . CHOLECYSTECTOMY    . COLONOSCOPY    . DILATION AND CURETTAGE OF UTERUS    . EYE SURGERY Bilateral 07/30/2017   cataract sx  . FLEXIBLE SIGMOIDOSCOPY  01/08/2012   Procedure: FLEXIBLE SIGMOIDOSCOPY;  Surgeon: Lafayette Dragon, MD;  Location: [facility];  Service: Endoscopy;  Laterality: N/A;  . HEMORRHOID SURGERY    . KNEE ARTHROTOMY     pt states no   . LAMINECTOMY N/A 01/31/2017   Procedure: Thoracic Laminectomy and marsupialization of arachnoid cyst;  Surgeon: Jovita Gamma, MD;  Location: [facility];  Service: Neurosurgery;  Laterality: N/A;  . LEFT OOPHORECTOMY  1976  . PARTIAL HYSTERECTOMY     right ovary remains  . TONSILLECTOMY  1972   Family History  Problem Relation Age of Onset  . Cirrhosis Father   . Heart disease Father   . Hypertension Mother   . Diabetes Mother   . Breast cancer Cousin   . Hypertension Sister   . Diabetes Sister   . Diabetes Cousin   . Diabetes Unknown        aunt  . Stroke Unknown        aunt  . Heart disease Brother        x 2  . Colon cancer Neg Hx    Social History   Socioeconomic History  . Marital status: Married    Spouse name: Not on file  . Number of children: 2  . Years of education: Not on file  . Highest education level: Not on file  Occupational History  . Occupation: Physicist, medical for [employer]  . Financial resource strain: Not on file  . Food insecurity:    Worry: Not on file    Inability: Not on file  . Transportation needs:    Medical: Not on file    Non-medical: Not on file  Tobacco Use  . 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 Smokeless tobacco: Never Used  Substance and Sexual Activity  . Alcohol use:  No  . Drug use: No  . Sexual activity: Yes  Lifestyle  . Physical activity:    Days per week: Not on file    Minutes per session: Not on file  . Stress: Not on file  Relationships  . Social connections:    Talks on phone: Not on file    Gets together: Not on file    Attends religious service: Not on file    Active member of club or organization: Not on file    Attends meetings of clubs or organizations: Not on file  Relationship status: Not on file  Other Topics Concern  . Not on file  Social History Narrative   Daily caffeine     Tobacco Counseling Counseling given: Not Answered   Clinical Intake: Pain : No/denies pain     Activities of Daily Living In your present state of health, do you have any difficulty performing the following activities: 02/20/2018 05/14/2017  Hearing? N N  Comment ringing in the ears -  Vision? N N  Comment hx of cataract sx. -  Difficulty concentrating or making decisions? N N  Walking or climbing stairs? N N  Dressing or bathing? N N  Doing errands, shopping? N N  Preparing Food and eating ? N -  Using the Toilet? N -  In the past six months, have you accidently leaked urine? N -  Do you have problems with loss of bowel control? N -  Managing your Medications? N -  Managing your Finances? N -  Housekeeping or managing your Housekeeping? N -  Some recent data might be hidden     Immunizations and Health Maintenance Immunization History  Administered Date(s) Administered  . Influenza,inj,Quad PF,6+ Mos 09/01/2015  . Td 10/30/1988, 08/25/2008   Health Maintenance Due  Topic Date Due  . DEXA SCAN  03/01/2015    Patient Care Team: Lucille Passy, MD as PCP - General (Family Medicine)  Indicate any recent Medical Services you may have received from other than [facility] providers in the past year (date may be approximate).     Assessment:   This is a routine wellness examination for Nicole Daniels. Physical assessment deferred to  PCP.   Hearing/Vision screen  Visual Acuity Screening   Right eye Left eye Both eyes  Without correction: 20/20 20/20 20/20   With correction:     Hearing Screening Comments: Able to hear conversational tones w/o difficulty. No issues reported.   Dietary issues and exercise activities discussed: Current Exercise Habits: The patient does not participate in regular exercise at present, Exercise limited by: None identified Diet (meal preparation, eat out, water intake, caffeinated beverages, dairy products, fruits and vegetables): well balanced   Goals    . Eliminating stressors.      Depression Screen PHQ 2/9 Scores 02/20/2018 11/21/2017 01/01/2017  PHQ - 2 Score 0 3 0  PHQ- 9 Score - 4 -    Fall Risk Fall Risk  02/20/2018 01/01/2017  Falls in the past year? No Yes  Comment - pt fell off porch  Number falls in past yr: - 1  Injury with Fall? - No     Cognitive Function: MMSE - Mini Mental State Exam 02/20/2018 01/01/2017  Orientation to time 5 5  Orientation to Place 5 5  Registration 3 3  Attention/ Calculation 5 0  Recall 3 3  Language- name 2 objects 2 0  Language- repeat 1 1  Language- follow 3 step command 3 3  Language- read & follow direction 1 0  Write a sentence 1 0  Copy design 1 0  Total score 30 20        Screening Tests Health Maintenance  Topic Date Due  . DEXA SCAN  03/01/2015  . INFLUENZA VACCINE  01/28/2024 (Originally 05/30/2018)  . PNA vac Low Risk Adult (1 of 2 - PCV13) 01/01/2026 (Originally 03/01/2015)  . TETANUS/TDAP  08/25/2018  . MAMMOGRAM  07/11/2019  . COLONOSCOPY  01/14/2020  . Hepatitis C Screening  Completed     Plan:   Follow up with Dr.  Aron as scheduled 02/27/18.  Continue to eat heart healthy diet (full of fruits, vegetables, whole grains, lean protein, water--limit salt, fat, and sugar intake) and increase physical activity as tolerated.  Continue doing brain stimulating activities (puzzles, reading, adult coloring books, staying  active) to keep memory sharp.     I have personally reviewed and noted the following in the patient's chart:   . Medical and social history . Use of alcohol, tobacco or illicit drugs  . Current medications and supplements . Functional ability and status . Nutritional status . Physical activity . Advanced directives . List of other physicians . Hospitalizations, surgeries, and ER visits in previous 12 months . Vitals . Screenings to include cognitive, depression, and falls . Referrals and appointments  In addition, I have reviewed and discussed with patient certain preventive protocols, quality metrics, and best practice recommendations. A written personalized care plan for preventive services as well as general preventive health recommendations were provided to patient.     Naaman Plummer [place], [place]   02/20/2018

## 2018-02-20 ENCOUNTER — Encounter: Payer: Self-pay | Admitting: Behavioral Health

## 2018-02-20 ENCOUNTER — Ambulatory Visit (INDEPENDENT_AMBULATORY_CARE_PROVIDER_SITE_OTHER): Payer: Medicare Other | Admitting: Behavioral Health

## 2018-02-20 VITALS — BP 138/74 | HR 75 | Ht 65.0 in | Wt 182.2 lb

## 2018-02-20 DIAGNOSIS — Z0000 Encounter for general adult medical examination without abnormal findings: Secondary | ICD-10-CM | POA: Diagnosis not present

## 2018-02-20 NOTE — Patient Instructions (Signed)
Follow up with Dr. Deborra Medina as scheduled 02/27/18.  Continue to eat heart healthy diet (full of fruits, vegetables, whole grains, lean protein, water--limit salt, fat, and sugar intake) and increase physical activity as tolerated.  Continue doing brain stimulating activities (puzzles, reading, adult coloring books, staying active) to keep memory sharp.    Nicole Daniels , Thank you for taking time to come for your Medicare Wellness Visit. I appreciate your ongoing commitment to your health goals. Please review the following plan we discussed and let me know if I can assist you in the future.   These are the goals we discussed: Goals    . Eliminating stressors.       This is a list of the screening recommended for you and due dates:  Health Maintenance  Topic Date Due  . DEXA scan (bone density measurement)  03/01/2015  . Flu Shot  01/28/2024*  . Pneumonia vaccines (1 of 2 - PCV13) 01/01/2026*  . Tetanus Vaccine  08/25/2018  . Mammogram  07/11/2019  . Colon Cancer Screening  01/14/2020  .  Hepatitis C: One time screening is recommended by Center for Disease Control  (CDC) for  adults born from [birth year] through 1966.   Completed  *Topic was postponed. The date shown is not the original due date.    Health Maintenance for Postmenopausal Women Menopause is a normal process in which your reproductive ability comes to an end. This process happens gradually over a span of months to years, usually between the ages of 61 and 39. Menopause is complete when you have missed 12 consecutive menstrual periods. It is important to talk with your health care provider about some of the most common conditions that affect postmenopausal women, such as heart disease, cancer, and bone loss (osteoporosis). Adopting a healthy lifestyle and getting preventive care can help to promote your health and wellness. Those actions can also lower your chances of developing some of these common conditions. What should I know about  menopause? During menopause, you may experience a number of symptoms, such as:  Moderate-to-severe hot flashes.  Night sweats.  Decrease in sex drive.  Mood swings.  Headaches.  Tiredness.  Irritability.  Memory problems.  Insomnia.  Choosing to treat or not to treat menopausal changes is an individual decision that you make with your health care provider. What should I know about hormone replacement therapy and supplements? Hormone therapy products are effective for treating symptoms that are associated with menopause, such as hot flashes and night sweats. Hormone replacement carries certain risks, especially as you become older. If you are thinking about using estrogen or estrogen with progestin treatments, discuss the benefits and risks with your health care provider. What should I know about heart disease and stroke? Heart disease, heart attack, and stroke become more likely as you age. This may be due, in part, to the hormonal changes that your body experiences during menopause. These can affect how your body processes dietary fats, triglycerides, and cholesterol. Heart attack and stroke are both medical emergencies. There are many things that you can do to help prevent heart disease and stroke:  Have your blood pressure checked at least every 1-2 years. High blood pressure causes heart disease and increases the risk of stroke.  If you are 68-68 years old, ask your health care provider if you should take aspirin to prevent a heart attack or a stroke.  Do not use any tobacco products, including cigarettes, chewing tobacco, or electronic cigarettes. If you need  help quitting, ask your health care provider.  It is important to eat a healthy diet and maintain a healthy weight. ? Be sure to include plenty of vegetables, fruits, low-fat dairy products, and lean protein. ? Avoid eating foods that are high in solid fats, added sugars, or salt (sodium).  Get regular exercise. This  is one of the most important things that you can do for your health. ? Try to exercise for at least 150 minutes each week. The type of exercise that you do should increase your heart rate and make you sweat. This is known as moderate-intensity exercise. ? Try to do strengthening exercises at least twice each week. Do these in addition to the moderate-intensity exercise.  Know your numbers.Ask your health care provider to check your cholesterol and your blood glucose. Continue to have your blood tested as directed by your health care provider.  What should I know about cancer screening? There are several types of cancer. Take the following steps to reduce your risk and to catch any cancer development as early as possible. Breast Cancer  Practice breast self-awareness. ? This means understanding how your breasts normally appear and feel. ? It also means doing regular breast self-exams. Let your health care provider know about any changes, no matter how small.  If you are 68 or older, have a clinician do a breast exam (clinical breast exam or CBE) every year. Depending on your age, family history, and medical history, it may be recommended that you also have a yearly breast X-ray (mammogram).  If you have a family history of breast cancer, talk with your health care provider about genetic screening.  If you are at high risk for breast cancer, talk with your health care provider about having an MRI and a mammogram every year.  Breast cancer (BRCA) gene test is recommended for women who have family members with BRCA-related cancers. Results of the assessment will determine the need for genetic counseling and BRCA1 and for BRCA2 testing. BRCA-related cancers include these types: ? Breast. This occurs in males or females. ? Ovarian. ? Tubal. This may also be called fallopian tube cancer. ? Cancer of the abdominal or pelvic lining (peritoneal cancer). ? Prostate. ? Pancreatic.  Cervical,  Uterine, and Ovarian Cancer Your health care provider may recommend that you be screened regularly for cancer of the pelvic organs. These include your ovaries, uterus, and vagina. This screening involves a pelvic exam, which includes checking for microscopic changes to the surface of your cervix (Pap test).  For women ages 21-65, health care providers may recommend a pelvic exam and a Pap test every three years. For women ages 61-65, they may recommend the Pap test and pelvic exam, combined with testing for human papilloma virus (HPV), every five years. Some types of HPV increase your risk of cervical cancer. Testing for HPV may also be done on women of any age who have unclear Pap test results.  Other health care providers may not recommend any screening for nonpregnant women who are considered low risk for pelvic cancer and have no symptoms. Ask your health care provider if a screening pelvic exam is right for you.  If you have had past treatment for cervical cancer or a condition that could lead to cancer, you need Pap tests and screening for cancer for at least 20 years after your treatment. If Pap tests have been discontinued for you, your risk factors (such as having a new sexual partner) need to be reassessed  to determine if you should start having screenings again. Some women have medical problems that increase the chance of getting cervical cancer. In these cases, your health care provider may recommend that you have screening and Pap tests more often.  If you have a family history of uterine cancer or ovarian cancer, talk with your health care provider about genetic screening.  If you have vaginal bleeding after reaching menopause, tell your health care provider.  There are currently no reliable tests available to screen for ovarian cancer.  Lung Cancer Lung cancer screening is recommended for adults 48-49 years old who are at high risk for lung cancer because of a history of smoking. A  yearly low-dose CT scan of the lungs is recommended if you:  Currently smoke.  Have a history of at least 30 pack-years of smoking and you currently smoke or have quit within the past 15 years. A pack-year is smoking an average of one pack of cigarettes per day for one year.  Yearly screening should:  Continue until it has been 15 years since you quit.  Stop if you develop a health problem that would prevent you from having lung cancer treatment.  Colorectal Cancer  This type of cancer can be detected and can often be prevented.  Routine colorectal cancer screening usually begins at age 28 and continues through age 34.  If you have risk factors for colon cancer, your health care provider may recommend that you be screened at an earlier age.  If you have a family history of colorectal cancer, talk with your health care provider about genetic screening.  Your health care provider may also recommend using home test kits to check for hidden blood in your stool.  A small camera at the end of a tube can be used to examine your colon directly (sigmoidoscopy or colonoscopy). This is done to check for the earliest forms of colorectal cancer.  Direct examination of the colon should be repeated every 5-10 years until age 87. However, if early forms of precancerous polyps or small growths are found or if you have a family history or genetic risk for colorectal cancer, you may need to be screened more often.  Skin Cancer  Check your skin from head to toe regularly.  Monitor any moles. Be sure to tell your health care provider: ? About any new moles or changes in moles, especially if there is a change in a mole's shape or color. ? If you have a mole that is larger than the size of a pencil eraser.  If any of your family members has a history of skin cancer, especially at a young age, talk with your health care provider about genetic screening.  Always use sunscreen. Apply sunscreen liberally  and repeatedly throughout the day.  Whenever you are outside, protect yourself by wearing long sleeves, pants, a wide-brimmed hat, and sunglasses.  What should I know about osteoporosis? Osteoporosis is a condition in which bone destruction happens more quickly than new bone creation. After menopause, you may be at an increased risk for osteoporosis. To help prevent osteoporosis or the bone fractures that can happen because of osteoporosis, the following is recommended:  If you are 56-42 years old, get at least 1,000 mg of calcium and at least 600 mg of vitamin D per day.  If you are older than age 62 but younger than age 90, get at least 1,200 mg of calcium and at least 600 mg of vitamin D per day.  If you are older than age 39, get at least 1,200 mg of calcium and at least 800 mg of vitamin D per day.  Smoking and excessive alcohol intake increase the risk of osteoporosis. Eat foods that are rich in calcium and vitamin D, and do weight-bearing exercises several times each week as directed by your health care provider. What should I know about how menopause affects my mental health? Depression may occur at any age, but it is more common as you become older. Common symptoms of depression include:  Low or sad mood.  Changes in sleep patterns.  Changes in appetite or eating patterns.  Feeling an overall lack of motivation or enjoyment of activities that you previously enjoyed.  Frequent crying spells.  Talk with your health care provider if you think that you are experiencing depression. What should I know about immunizations? It is important that you get and maintain your immunizations. These include:  Tetanus, diphtheria, and pertussis (Tdap) booster vaccine.  Influenza every year before the flu season begins.  Pneumonia vaccine.  Shingles vaccine.  Your health care provider may also recommend other immunizations. This information is not intended to replace advice given to you  by your health care provider. Make sure you discuss any questions you have with your health care provider. Document Released: 12/08/2005 Document Revised: 05/05/2016 Document Reviewed: 07/20/2015 Elsevier Interactive Patient Education  2018 [employer].

## 2018-02-21 NOTE — Progress Notes (Signed)
Medical screening examination/treatment/procedure(s) were performed by the Wellness Coach, RN. As primary care provider I was immediately available for consulation/collaboration. I agree with above documentation. Charlotte Nche, AGNP-C 

## 2018-02-27 ENCOUNTER — Encounter: Payer: Medicare Other | Admitting: Family Medicine

## 2018-02-28 ENCOUNTER — Ambulatory Visit (INDEPENDENT_AMBULATORY_CARE_PROVIDER_SITE_OTHER): Payer: Medicare Other | Admitting: Family Medicine

## 2018-02-28 ENCOUNTER — Encounter: Payer: Self-pay | Admitting: Family Medicine

## 2018-02-28 VITALS — BP 124/74 | HR 71 | Temp 98.6°F | Ht 65.0 in | Wt 179.2 lb

## 2018-02-28 DIAGNOSIS — I10 Essential (primary) hypertension: Secondary | ICD-10-CM

## 2018-02-28 DIAGNOSIS — Z634 Disappearance and death of family member: Secondary | ICD-10-CM

## 2018-02-28 DIAGNOSIS — F4329 Adjustment disorder with other symptoms: Secondary | ICD-10-CM

## 2018-02-28 DIAGNOSIS — E2839 Other primary ovarian failure: Secondary | ICD-10-CM

## 2018-02-28 DIAGNOSIS — E785 Hyperlipidemia, unspecified: Secondary | ICD-10-CM

## 2018-02-28 DIAGNOSIS — E559 Vitamin D deficiency, unspecified: Secondary | ICD-10-CM | POA: Diagnosis not present

## 2018-02-28 DIAGNOSIS — F4321 Adjustment disorder with depressed mood: Secondary | ICD-10-CM

## 2018-02-28 LAB — LIPID PANEL
CHOL/HDL RATIO: 5
Cholesterol: 240 mg/dL — ABNORMAL HIGH (ref 0–200)
HDL: 47.8 mg/dL (ref >39.00)
LDL CALC: 152 mg/dL — AB (ref 0–99)
NonHDL: 192.16
TRIGLYCERIDES: 200 mg/dL — AB (ref 0.0–149.0)
VLDL: 40 mg/dL (ref 0.0–40.0)

## 2018-02-28 LAB — VITAMIN D 25 HYDROXY (VIT D DEFICIENCY, FRACTURES): VITD: 25.4 ng/mL — AB (ref 30.00–100.00)

## 2018-02-28 LAB — COMPREHENSIVE METABOLIC PANEL
ALT: 11 U/L (ref 0–35)
AST: 14 U/L (ref 0–37)
Albumin: 4.2 g/dL (ref 3.5–5.2)
Alkaline Phosphatase: 80 U/L (ref 39–117)
BUN: 14 mg/dL (ref 6–23)
CHLORIDE: 102 meq/L (ref 96–112)
CO2: 30 meq/L (ref 19–32)
CREATININE: 0.79 mg/dL (ref 0.40–1.20)
Calcium: 9.8 mg/dL (ref 8.4–10.5)
GFR: 76.92 mL/min (ref >60.00)
Glucose, Bld: 95 mg/dL (ref 70–99)
Potassium: 3.6 mEq/L (ref 3.5–5.1)
Sodium: 141 mEq/L (ref 135–145)
Total Bilirubin: 0.5 mg/dL (ref 0.2–1.2)
Total Protein: 6.8 g/dL (ref 6.0–8.3)

## 2018-02-28 LAB — CBC WITH DIFFERENTIAL/PLATELET
BASOS PCT: 0.6 % (ref 0.0–3.0)
Basophils Absolute: 0 10*3/uL (ref 0.0–0.1)
EOS ABS: 0.2 10*3/uL (ref 0.0–0.7)
Eosinophils Relative: 3.6 % (ref 0.0–5.0)
HEMATOCRIT: 44.3 % (ref 36.0–46.0)
Hemoglobin: 15 g/dL (ref 12.0–15.0)
LYMPHS PCT: 37.8 % (ref 12.0–46.0)
Lymphs Abs: 2.2 10*3/uL (ref 0.7–4.0)
MCHC: 33.8 g/dL (ref 30.0–36.0)
MCV: 91.8 fl (ref 78.0–100.0)
Monocytes Absolute: 0.5 10*3/uL (ref 0.1–1.0)
Monocytes Relative: 7.8 % (ref 3.0–12.0)
NEUTROS ABS: 2.9 10*3/uL (ref 1.4–7.7)
Neutrophils Relative %: 50.2 % (ref 43.0–77.0)
PLATELETS: 370 10*3/uL (ref 150.0–400.0)
RBC: 4.82 Mil/uL (ref 3.87–5.11)
RDW: 12.9 % (ref 11.5–15.5)
WBC: 5.8 10*3/uL (ref 4.0–10.5)

## 2018-02-28 LAB — TSH: TSH: 5.52 u[IU]/mL — AB (ref 0.35–4.50)

## 2018-02-28 MED ORDER — HYDROCHLOROTHIAZIDE 25 MG PO TABS: ORAL_TABLET | ORAL | 2 refills | Status: DC

## 2018-02-28 NOTE — Assessment & Plan Note (Signed)
Labs today

## 2018-02-28 NOTE — Assessment & Plan Note (Signed)
Doing well on zoloft 25 mg daily.

## 2018-02-28 NOTE — Progress Notes (Signed)
Subjective:   Patient ID: Nicole Daniels, female    DOB: Sep 11, 1951, 67 y.o.   MRN: [id number]  Nicole Daniels is a pleasant 67 y.o. year old female who presents to clinic today with Follow-up (Patient is here today for a F/U.  She had her AWV on 4.24.19.  No labs were completed at that time.  Today she has had a few sips of Pepsi this am only.  She will need a refill of her HCTZ.  She is going for her Mammogram in September but will need an order for a BMD to be done at the same time as she is due.)  on 02/28/2018  HPI:  Medicare wellness visit with Eduard Roux, RN on 02/20/18. Note reviewed.  Still taking zoloft intermittently.  Feels currently her symptoms are well controlled.  HTN- BP has been well controlled on current dose of HCTZ.  Lab Results  Component Value Date   CREATININE 0.74 05/21/2017    HLD- due for labs. Was on simvastatin- caused myalgias. Lab Results  Component Value Date   CHOL 256 (H) 05/14/2017   HDL 51 05/14/2017   LDLCALC 164 (H) 05/14/2017   LDLDIRECT 181.0 01/01/2017   TRIG 205 (H) 05/14/2017   CHOLHDL 5.0 05/14/2017      Review of Systems  Constitutional: Negative.   HENT: Negative.   Respiratory: Negative.   Cardiovascular: Negative.   Gastrointestinal: Negative.   Endocrine: Negative.   Genitourinary: Negative.   Musculoskeletal: Negative.  Negative for back pain.  Allergic/Immunologic: Negative.   Neurological: Negative.   Hematological: Negative.   Psychiatric/Behavioral: Negative.   All other systems reviewed and are negative.      Objective:    BP 124/74 (BP Location: Left Arm, Patient Position: Sitting, Cuff Size: Normal)   Pulse 71   Temp 98.6 F (37 C) (Oral)   Ht 5\' 5"  (1.651 m)   Wt 179 lb 3.2 oz (81.3 kg)   SpO2 96%   BMI 29.82 kg/m    Physical Exam    General:  Well-developed,well-nourished,in no acute distress; alert,appropriate and cooperative throughout examination Head:  normocephalic and  atraumatic.   Eyes:  vision grossly intact, PERRL Ears:  R ear normal and L ear normal externally, TMs clear bilaterally Nose:  no external deformity.   Mouth:  good dentition.   Neck:  No deformities, masses, or tenderness noted. Breasts:  No mass, nodules, thickening, tenderness, bulging, retraction, inflamation, nipple discharge or skin changes noted.   Lungs:  Normal respiratory effort, chest expands symmetrically. Lungs are clear to auscultation, no crackles or wheezes. Heart:  Normal rate and regular rhythm. S1 and S2 normal without gallop, murmur, click, rub or other extra sounds. Abdomen:  Bowel sounds positive,abdomen soft and non-tender without masses, organomegaly or hernias noted. Msk:  No deformity or scoliosis noted of thoracic or lumbar spine.   Extremities:  No clubbing, cyanosis, edema, or deformity noted with normal full range of motion of all joints.   Neurologic:  alert & oriented X3 and gait normal.   Skin:  Intact without suspicious lesions or rashes Cervical Nodes:  No lymphadenopathy noted Axillary Nodes:  No palpable lymphadenopathy Psych:  Cognition and judgment appear intact. Alert and cooperative with normal attention span and concentration. No apparent delusions, illusions, hallucinations        Assessment & Plan:   Estrogen deficiency - Plan: DG Bone Density  Vitamin D deficiency  Essential hypertension  Hyperlipidemia, unspecified hyperlipidemia type No follow-ups on file.

## 2018-02-28 NOTE — Assessment & Plan Note (Signed)
Well controlled.  No changes made. 

## 2018-02-28 NOTE — Patient Instructions (Addendum)
Great to see you. I will call you with your lab results from today and you can view them online.    Happy Birthday!!!  I have ordered your bone density.  You will be due for your mammogram after 07/10/2018. Please call the breast center at [phone] to schedule your bone density with your mammogram.

## 2018-02-28 NOTE — Assessment & Plan Note (Signed)
Check Vit D today. 

## 2018-03-01 DIAGNOSIS — M7582 Other shoulder lesions, left shoulder: Secondary | ICD-10-CM | POA: Insufficient documentation

## 2018-03-11 ENCOUNTER — Other Ambulatory Visit: Payer: Self-pay | Admitting: Family Medicine

## 2018-03-11 DIAGNOSIS — Z139 Encounter for screening, unspecified: Secondary | ICD-10-CM

## 2018-03-30 HISTORY — PX: NECK SURGERY: SHX720

## 2018-04-01 SURGERY — Surgical Case
Anesthesia: *Unknown

## 2018-04-24 ENCOUNTER — Telehealth: Payer: Self-pay | Admitting: Family Medicine

## 2018-04-24 NOTE — Telephone Encounter (Signed)
Received call from patient regarding Emmi call. Returned patient's call (no answer) to inform her that she has already completed her AWV for 2019. SF

## 2018-04-25 DIAGNOSIS — Z981 Arthrodesis status: Secondary | ICD-10-CM | POA: Insufficient documentation

## 2018-04-25 HISTORY — DX: Arthrodesis status: Z98.1

## 2018-05-08 ENCOUNTER — Encounter: Payer: Self-pay | Admitting: Family Medicine

## 2018-07-11 ENCOUNTER — Ambulatory Visit
Admission: RE | Admit: 2018-07-11 | Discharge: 2018-07-11 | Disposition: A | Discharge status: Home / Self Care | Payer: Medicare Other | Source: Ambulatory Visit | Attending: Family Medicine | Admitting: Family Medicine

## 2018-07-11 DIAGNOSIS — Z139 Encounter for screening, unspecified: Secondary | ICD-10-CM

## 2018-07-11 DIAGNOSIS — E2839 Other primary ovarian failure: Secondary | ICD-10-CM

## 2018-07-11 IMAGING — RF DG THORACIC SPINE 2V
1 series · 2 of 2 positions shown · non-contrast
Comparison: None.

CLINICAL DATA: T2-3 laminectomy

EXAM:
THORACIC SPINE 2 VIEWS; DG C-ARM 61-120 MIN

[Series 1: run · 2 of 2 slices shown]
[im 1/2]
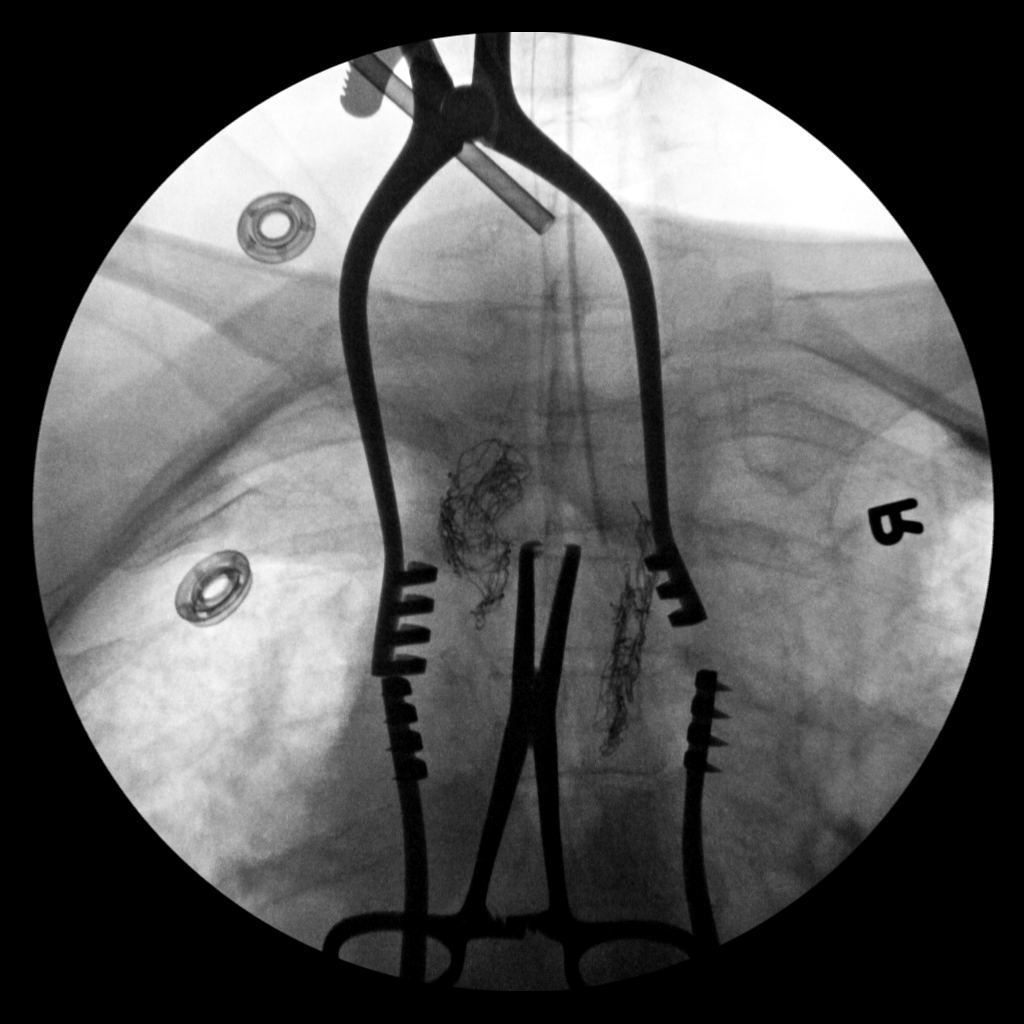
[im 2/2]
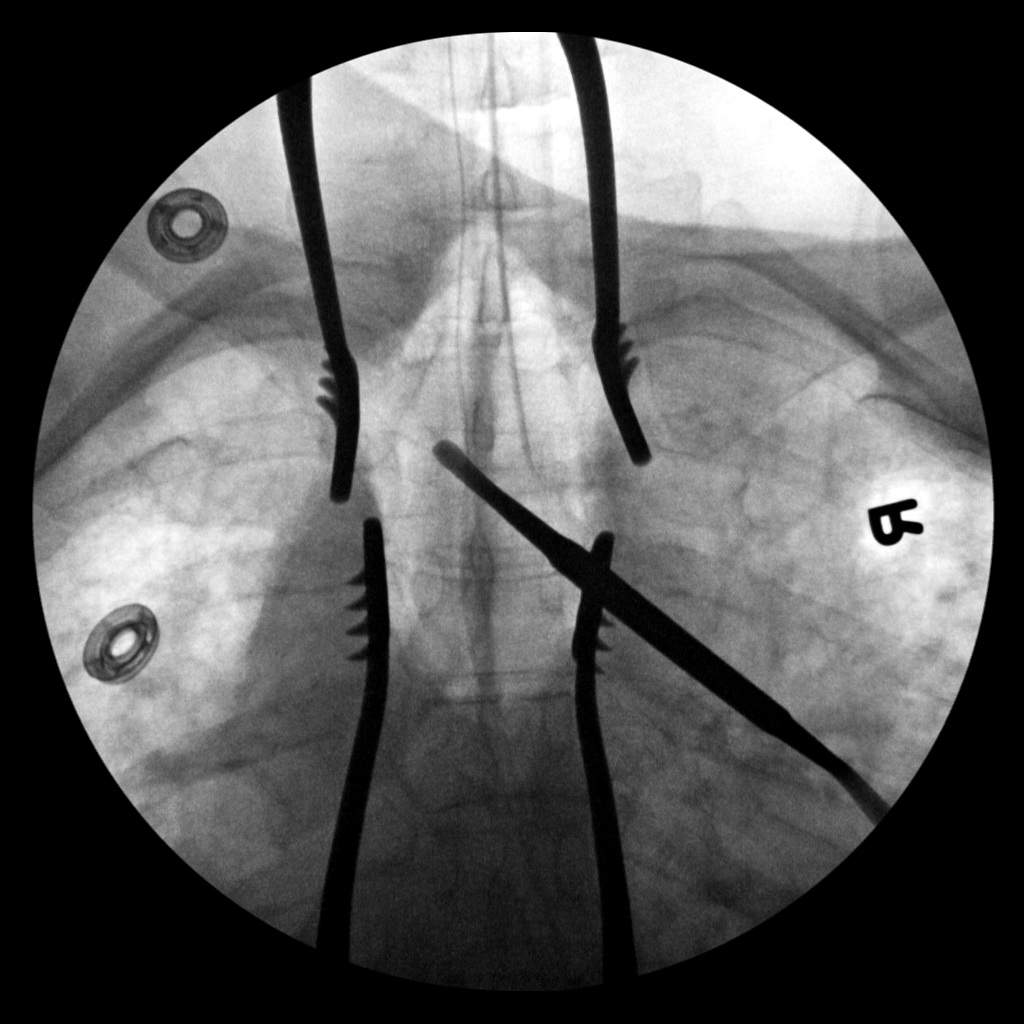

[2 of 2 positions shown; findings below may reference images not displayed]

FINDINGS: Spot fluoro image upper thoracic spine PA projection obtained 6623
hours shows endotracheal and esophageal tubes in situ. Soft tissue
retractors overlying the upper thoracic region with surgical sponges
noted in the operative site. Surgical clamp device overlies the
midline thoracic spine with the tip position at the T2-3 level.

A second spot fluoro image obtained at 8997 hours shows soft tissue
retractors. Surgical probe device overlies the wound with the tip
overlying a position just to the right of the T2 spinous process.
IMPRESSION: Intraoperative localization during T2-3 laminectomy.

## 2018-10-17 DIAGNOSIS — M419 Scoliosis, unspecified: Secondary | ICD-10-CM | POA: Insufficient documentation

## 2018-11-13 ENCOUNTER — Ambulatory Visit: Payer: Medicare Other | Admitting: Physical Therapy

## 2018-11-18 ENCOUNTER — Ambulatory Visit: Payer: Medicare Other | Admitting: Physical Therapy

## 2018-11-20 ENCOUNTER — Ambulatory Visit: Payer: Medicare Other | Admitting: Physical Therapy

## 2018-11-25 ENCOUNTER — Encounter: Payer: Medicare Other | Admitting: Physical Therapy

## 2018-11-27 ENCOUNTER — Encounter: Payer: Medicare Other | Admitting: Physical Therapy

## 2018-12-02 ENCOUNTER — Encounter: Payer: Medicare Other | Admitting: Physical Therapy

## 2018-12-05 ENCOUNTER — Encounter: Payer: Medicare Other | Admitting: Physical Therapy

## 2018-12-23 ENCOUNTER — Ambulatory Visit: Payer: Medicare Other | Attending: Neurosurgery

## 2018-12-23 ENCOUNTER — Other Ambulatory Visit: Payer: Self-pay

## 2018-12-23 DIAGNOSIS — G8929 Other chronic pain: Secondary | ICD-10-CM | POA: Diagnosis present

## 2018-12-23 DIAGNOSIS — M546 Pain in thoracic spine: Secondary | ICD-10-CM

## 2018-12-23 DIAGNOSIS — R262 Difficulty in walking, not elsewhere classified: Secondary | ICD-10-CM

## 2018-12-23 DIAGNOSIS — M545 Low back pain: Secondary | ICD-10-CM | POA: Diagnosis present

## 2018-12-23 DIAGNOSIS — M6281 Muscle weakness (generalized): Secondary | ICD-10-CM | POA: Diagnosis present

## 2018-12-23 DIAGNOSIS — M5412 Radiculopathy, cervical region: Secondary | ICD-10-CM

## 2018-12-23 NOTE — Therapy (Signed)
[facility] [address] [place], [address] Phone: [phone]   Fax:  [phone]  Physical Therapy Evaluation  Patient Details   Name: Nicole Daniels MRN: [id number] Date of Birth: 21-Nov-1950 Referring Provider (PT): Jovita Gamma, MD   Encounter Date: 12/23/2018  PT End of Session - 12/23/18 1652    Visit Number  1    Number of Visits  17    Date for PT Re-Evaluation  02/20/19    Authorization Type  1     Authorization Time Period  of 10 progress report (from 12/23/2018)    PT Start Time  1652    PT Stop Time  1819    PT Time Calculation (min)  87 min    Activity Tolerance  Patient tolerated treatment well    Behavior During Therapy  [facility] for tasks assessed/performed       Past Medical History:  Diagnosis Date  . Abdominal pain, right lower quadrant   . Acquired cyst of kidney 01/08/2012   pt denies history of   . Acute sinusitis, unspecified   . Adenomatous colon polyp   . Angiomyolipoma    kidney  . Chest pain   . Chest pain, unspecified   . Depressive disorder, not elsewhere classified 01/08/2012   not currently   . Diverticulosis   . Dizziness and giddiness   . Dysfunction of eustachian tube   . Hemorrhoids   . History of diverticulitis of colon   . IBS (irritable bowel syndrome)   . Liver lesion 2011  . Neoplasm of skin of face    malignant  . OA (osteoarthritis)    knee  . Obesity, unspecified   . Ostium secundum type atrial septal defect   . Other and unspecified hyperlipidemia   . Other malaise and fatigue   . Palpitations   . Shortness of breath   . Unspecified essential hypertension    pt states she does not have HTN  . Unspecified sinusitis (chronic)   . Unspecified vitamin D deficiency     Past Surgical History:  Procedure Laterality Date  . ABDOMINAL HYSTERECTOMY    . BREAST REDUCTION SURGERY  1990  . CHOLECYSTECTOMY    . COLONOSCOPY    . DILATION AND  CURETTAGE OF UTERUS    . EYE SURGERY Bilateral 07/30/2017   cataract sx  . FLEXIBLE SIGMOIDOSCOPY  01/08/2012   Procedure: FLEXIBLE SIGMOIDOSCOPY;  Surgeon: Lafayette Dragon, MD;  Location: [facility];  Service: Endoscopy;  Laterality: N/A;  . HEMORRHOID SURGERY    . KNEE ARTHROTOMY     pt states no   . LAMINECTOMY N/A 01/31/2017   Procedure: Thoracic Laminectomy and marsupialization of arachnoid cyst;  Surgeon: Jovita Gamma, MD;  Location: [facility];  Service: Neurosurgery;  Laterality: N/A;  . LEFT OOPHORECTOMY  1976  . PARTIAL HYSTERECTOMY     right ovary remains  . TONSILLECTOMY  1972    There were no vitals filed for this visit.   Subjective Assessment - 12/23/18 1701    Subjective  Mid back pain: 4/10 currently (pt sitting), 9/10 at worst for the past 3 months; Low back: 0/10 currently, 10+/10 at worst for the past 3 months. L shoulder area:  6/10 currently, 8/10 at most for the past 3 months.     Pertinent History  Pain in thoracic spine, upper and lower back, L shoulder. Low back has not bothered her for about 3-4 weeks ago. Pain  lasted a few days and it was gone.  Pt had R LE radiating pain along the L5/S1 dermatome which also went away. R LE symptoms was present intermittently for a couple of weeks and its been fine afterwards.  Denies loss of bowel or bladder function or saddle anesthesia.  Pt states she constantly has pain in her mid back.  Pt also states pain in L shoulder with tingling behind her shoulder.  Had surgery to alleviate the cyst in 2018. Could not remove cyst since it was on the spine and the fluid was drained.  Fluid did not come back based on her MRI last year.    Had neck surgery June 2019 which helped with her neck pain and helped her regain strength in her L arm.  Still has pain along her L arm (C5/6 dermatome area).  Mid back pain began after her thoracic spine surgery.  Mid back pain has worsened since the neck surgery.  L shoulder area pain feels like it is slightly  getting worse since onset.  Main pain from mid back is from either standing or walking. Pt is R hand dominant but uses her L hand more than her R.     Low back pain began January 2020 suddenly, unknown mechanism of injury. Difficult to straighten up and walk.     Patient Stated Goals  Be able to walk and be up an about such as in stores more comfortably.     Currently in Pain?  Yes    Pain Score  6    L shoulder area   Pain Location  Back    Pain Orientation  Left;Mid;Lower   mid back and L UE   Pain Descriptors / Indicators  --   sharp, stabbing   Pain Onset  More than a month ago    Pain Frequency  Constant    Aggravating Factors   Mid back/thoracic spine pain: walking on concrete, standing over 15 minutes. Low back: standing over 15-20 minutes.  L shoulder: reaching across (causes cramping at C5/6 dermatome area).  Using her L arm a lot.     Pain Relieving Factors  Mid back pain: sitting, heat. L shoulder area: heat on L upper trap muscle,          [facility] PT Assessment - 12/23/18 1726      Assessment   Medical Diagnosis  Pain in thoracic spine    Referring Provider (PT)  Jovita Gamma, MD    Onset Date/Surgical Date  10/17/18    Hand Dominance  Right    Next MD Visit  March 2020    Prior Therapy  No known PT for current condition.       Precautions   Precaution Comments  hx of cervical, and thoracic surgery      Restrictions   Other Position/Activity Restrictions  no known weight bearing restrictions      Balance Screen   Has the patient fallen in the past 6 months  No    Has the patient had a decrease in activity level because of a fear of falling?   No    Is the patient reluctant to leave their home because of a fear of falling?   No      Home Environment   Additional Comments  1 story home with basement. 12 steps to go down to basement R rail assist going down. Pt lives alone.  12 steps to enter side door B rail assist. 2 step  to enter front door.        Prior  Function   Vocation  Retired    Biomedical scientist  Better able to ambulate longer distances, tolerate prolonged standing and reach with her L arm more comfortably.       Observation/Other Assessments   Observations  Slump: tension B tibial nerve.  (+) empty can, Neer's impingement, Hawkin's Kennedy  for L shoulder       Posture/Postural Control   Posture Comments  Protracted neck, B protracted shoulders, R shoulder lower, R iliac crest lower, R greater trochanter slightly lower, L foot pronation, slight R trunk side bend, slight R trunk rotation, sligh L anterior pelvic rotation       AROM   Cervical Flexion  WFL    Cervical Extension  WFL, movement preference around C5/6 area and C3/4 area    Cervical - Right Side Bend  limited with reproduction of L lateral neck/upper trap tightness/pain    Cervical - Left Side Bend  limited    Cervical - Right Rotation  WFL    Cervical - Left Rotation  limited with reproduction of L shoulder/upper trap pain/tightness    Lumbar Flexion  WFL    Lumbar Extension  WFL with mid back discomfort    Lumbar - Right Side Bend  WFL    Lumbar - Left Side Bend  limited     Lumbar - Right Rotation  WFL   performed in sitting   Lumbar - Left Rotation  WFL   performed in sitting     Strength   Right Shoulder Flexion  4/5    Right Shoulder ABduction  4/5    Right Shoulder Internal Rotation  5/5    Right Shoulder External Rotation  4+/5    Left Shoulder Flexion  4-/5   with anterior shoulder joint pain   Left Shoulder ABduction  4/5   with L anterior shoulder pain   Left Shoulder Internal Rotation  4+/5    Left Shoulder External Rotation  4/5    Right Elbow Flexion  4/5    Right Elbow Extension  4/5    Left Elbow Flexion  4-/5    Left Elbow Extension  4/5    Right Wrist Extension  4+/5    Left Wrist Extension  4/5    Right Hip Flexion  4-/5    Right Hip Extension  3+/5   seated manually resisted   Right Hip ABduction  4+/5   seated clamshell  isometric   Left Hip Flexion  4-/5    Left Hip Extension  4/5   seated manually resisted   Left Hip ABduction  4+/5   seated clamshell isometric   Right Knee Flexion  4/5    Right Knee Extension  5/5    Left Knee Flexion  4/5   with slight L lateral thigh cramp   Left Knee Extension  4+/5      Palpation   Palpation comment  Tense B Upper trap muscles L > R                Objective measurements completed on examination: See above findings.   No latex allergies Pt states that her blood pressure is controlled.  Neck surgery June 2019   Pt adds that she feels like there is a big pad at her L upper trap area (decreased sensation)     Medbridge Access Code: [facility]   March 23? Next MD visit  Therapeutic exercise  Seated scapular retraction 10x5 seconds, then 3x5 seconds. Decreased mid back pain  Then with yellow band 10x5 seconds L anterior shoulder discomfort   Decreased anterior shoulder discomfort when L arm did not extend past trunk   Reviewed and gave scapular retraction (10x3 with 5 second holds daily) as part of her HEP. Pt demonstrated and verbalized understanding.    Improved exercise technique, movement at target joints, use of target muscles after mod verbal, visual, tactile cues.   Manual therapy   Seated STM L upper trap muscle    Decreased L shoulder pain/tightness after manual therapy. Feels better per pt. Pt states mid back feeling better as well after session.        Patient is a 68 year old female who came to physical therapy secondary to mid back, L shoulder and low back pain. She also presents with poor posture and L shoulder mechanics, bilateral upper trap muscle tension, L UE radiating symptoms, reproduction of L upper trap symptoms with R cervical side bend and L cervical rotation, reproduction of mid back pain with trunk extension; L shoulder ER weakness, scapular weakness, B hip weakness, positive special tests suggesting L shoulder  impingement and rotator cuff involvement; and pt demonstrates difficulty performing functional tasks such as walking and reaching, and difficulty tolerating positions such as prolonged standing due to pain. Pt will benefit from skilled physical therapy services to address the aforementioned deficits.     PT Education - 12/23/18 1904    Education Details  ther-ex, HEP, plan of care    Person(s) Educated  Patient    Methods  Explanation;Demonstration;Tactile cues;Verbal cues    Comprehension  Returned demonstration;Verbalized understanding       PT Short Term Goals - 12/23/18 1841      PT SHORT TERM GOAL #1   Title  Patient will be independent with her HEP to decrease pain, improve strength and function.     Baseline  Pt has started her HEP (12/23/2018)    Time  3    Period  Weeks    Status  New    Target Date  01/16/19        PT Long Term Goals - 12/23/18 1842      PT LONG TERM GOAL #1   Title  Patient will have a decrease in mid back pain to 4/10 or less at worst to promote ability to ambulate longer distances and improve ability to stand for longer periods.     Baseline  9/10 at worst for the past 3 months (12/23/2018)    Time  8    Period  Weeks    Status  New    Target Date  02/20/19      PT LONG TERM GOAL #2   Title  Patient will have a decrease in L shoulder pain to 3/10 or less at worst to promote ability to reach, and use her L UE for functional tasks.     Baseline  8/10 L shoulder pain at worst for the past 3 months (12/23/2018)    Time  8    Period  Weeks    Status  New    Target Date  02/20/19      PT LONG TERM GOAL #3   Title  Patient will have a decrease in low back pain to 3/10 or less at worst to promote ability to ambulate longer distances and improve ability to stand for longer periods.     Baseline  10+/10 at  worst for the past 3 months (12/23/2018)    Time  8    Period  Weeks    Status  New    Target Date  02/20/19      PT LONG TERM GOAL #4   Title   Pt will improve L shoulder ER muscle strength by at least 1/2 MMT grade to promote ability to raise her arm with less pain.     Baseline  4/5 (12/23/2018)    Time  8    Period  Weeks    Status  New    Target Date  02/20/19      PT LONG TERM GOAL #5   Title  Patient will improve B hip strength by at least 1/2 MMT grade to promote ability to ambulate longer distances, as well as perform standing tasks more comfortably for her back.     Time  8    Period  Weeks    Status  New    Target Date  02/20/19             Plan - 12/23/18 1835    Clinical Impression Statement  Patient is a 68 year old female who came to physical therapy secondary to mid back, L shoulder and low back pain. She also presents with poor posture and L shoulder mechanics, bilateral upper trap muscle tension, L UE radiating symptoms, reproduction of L upper trap symptoms with R cervical side bend and L cervical rotation, reproduction of mid back pain with trunk extension; L shoulder ER weakness, scapular weakness, B hip weakness, positive special tests suggesting L shoulder impingement and rotator cuff involvement; and pt demonstrates difficulty performing functional tasks such as walking and reaching, and difficulty tolerating positions such as prolonged standing due to pain. Pt will benefit from skilled physical therapy services to address the aforementioned deficits.     History and Personal Factors relevant to plan of care:  Chronicity of condition, multiple areas of pain, multiple comorbidities, difficulty reaching, walking, standing for prolonged periods; radiating symptoms L UE.     Clinical Presentation  Evolving    Clinical Presentation due to:  Pt states mid back and L shoulder pain have worsened since onset    Clinical Decision Making  Moderate    Rehab Potential  Fair    Clinical Impairments Affecting Rehab Potential  (-) chronicity of condition, multiple areas of pain, weakness, multiple comorbidities; (+)  motivated    PT Frequency  2x / week    PT Duration  8 weeks    PT Treatment/Interventions  Therapeutic exercise;Neuromuscular re-education;Therapeutic activities;Gait training;Patient/family education;Manual techniques;Aquatic Therapy;Electrical Stimulation;Iontophoresis 4mg /ml Dexamethasone    PT Next Visit Plan  Scapular strengthening, trunk and hip strengthening, manual techniques, modalities PRN    Consulted and Agree with Plan of Care  Patient       Patient will benefit from skilled therapeutic intervention in order to improve the following deficits and impairments:  Pain, Postural dysfunction, Improper body mechanics, Difficulty walking, Decreased strength  Visit Diagnosis: Pain in thoracic spine - Plan: PT plan of care cert/re-cert  Radiculopathy, cervical region - Plan: PT plan of care cert/re-cert  Chronic bilateral low back pain, unspecified whether sciatica present - Plan: PT plan of care cert/re-cert  Muscle weakness (generalized) - Plan: PT plan of care cert/re-cert  Difficulty in walking, not elsewhere classified - Plan: PT plan of care cert/re-cert     Problem List Patient Active Problem List   Diagnosis Date Noted  . GERD (gastroesophageal  reflux disease) 11/21/2017  . Complicated grief 25/27/1292  . Hypokalemia 05/14/2017  . Intradural arachnoid cyst of spine 01/31/2017  . Allergic reaction 11/12/2013  . Essential hypertension 12/06/2010  . MIGRAINE, CHRONIC 11/24/2010  . Vitamin D deficiency 04/08/2010  . DEPRESSION, MILD 03/09/2010  . RENAL CYST 12/22/2009  . OBESITY 08/11/2008  . NEOPLASM, MALIGNANT, SKIN, FACE 12/01/2007  . PATENT FORAMEN OVALE 01/28/2005  . HLD (hyperlipidemia) 03/30/2001    Joneen Boers PT, DPT   12/23/2018, 7:12 PM  [facility] [address] [place], [address] Phone: [phone]   Fax:  [phone]  Name: Nicole Daniels MRN: [id number] Date of  Birth: [birth year]

## 2018-12-23 NOTE — Patient Instructions (Signed)
Medbridge Access Code: [email or web address]   Seated Scapular Retraction 10x3 with 5 second holds

## 2018-12-25 ENCOUNTER — Ambulatory Visit: Payer: Medicare Other | Admitting: Physical Therapy

## 2018-12-26 ENCOUNTER — Ambulatory Visit: Payer: Medicare Other

## 2018-12-30 ENCOUNTER — Ambulatory Visit: Payer: Medicare Other | Attending: Neurosurgery

## 2018-12-30 ENCOUNTER — Ambulatory Visit: Payer: Medicare Other | Admitting: Physical Therapy

## 2018-12-30 DIAGNOSIS — M545 Low back pain, unspecified: Secondary | ICD-10-CM

## 2018-12-30 DIAGNOSIS — G8929 Other chronic pain: Secondary | ICD-10-CM | POA: Diagnosis present

## 2018-12-30 DIAGNOSIS — M546 Pain in thoracic spine: Secondary | ICD-10-CM | POA: Insufficient documentation

## 2018-12-30 DIAGNOSIS — R262 Difficulty in walking, not elsewhere classified: Secondary | ICD-10-CM | POA: Diagnosis present

## 2018-12-30 DIAGNOSIS — M6281 Muscle weakness (generalized): Secondary | ICD-10-CM | POA: Diagnosis present

## 2018-12-30 DIAGNOSIS — M5412 Radiculopathy, cervical region: Secondary | ICD-10-CM | POA: Insufficient documentation

## 2018-12-30 NOTE — Therapy (Signed)
[place] [facility] [address] [place], [address] Phone: [phone]   Fax:  [phone]  Physical Therapy Treatment  Patient Details  Name: Nicole Daniels MRN: [id number] Date of Birth: 1951/08/16 Referring Provider (PT): Jovita Gamma, MD   Encounter Date: 12/30/2018  PT End of Session - 12/30/18 0804    Visit Number  2    Number of Visits  17    Date for PT Re-Evaluation  02/20/19    Authorization Type  2    Authorization Time Period  of 10 progress report (from 12/23/2018)    PT Start Time  0804    PT Stop Time  0848    PT Time Calculation (min)  44 min    Activity Tolerance  Patient tolerated treatment well    Behavior During Therapy  [facility] for tasks assessed/performed       Past Medical History:  Diagnosis Date  . Abdominal pain, right lower quadrant   . Acquired cyst of kidney 01/08/2012   pt denies history of   . Acute sinusitis, unspecified   . Adenomatous colon polyp   . Angiomyolipoma    kidney  . Chest pain   . Chest pain, unspecified   . Depressive disorder, not elsewhere classified 01/08/2012   not currently   . Diverticulosis   . Dizziness and giddiness   . Dysfunction of eustachian tube   . Hemorrhoids   . History of diverticulitis of colon   . IBS (irritable bowel syndrome)   . Liver lesion 2011  . Neoplasm of skin of face    malignant  . OA (osteoarthritis)    knee  . Obesity, unspecified   . Ostium secundum type atrial septal defect   . Other and unspecified hyperlipidemia   . Other malaise and fatigue   . Palpitations   . Shortness of breath   . Unspecified essential hypertension    pt states she does not have HTN  . Unspecified sinusitis (chronic)   . Unspecified vitamin D deficiency     Past Surgical History:  Procedure Laterality Date  . ABDOMINAL HYSTERECTOMY    . BREAST REDUCTION SURGERY  1990  . CHOLECYSTECTOMY    . COLONOSCOPY    . DILATION AND  CURETTAGE OF UTERUS    . EYE SURGERY Bilateral 07/30/2017   cataract sx  . FLEXIBLE SIGMOIDOSCOPY  01/08/2012   Procedure: FLEXIBLE SIGMOIDOSCOPY;  Surgeon: Lafayette Dragon, MD;  Location: [facility];  Service: Endoscopy;  Laterality: N/A;  . HEMORRHOID SURGERY    . KNEE ARTHROTOMY     pt states no   . LAMINECTOMY N/A 01/31/2017   Procedure: Thoracic Laminectomy and marsupialization of arachnoid cyst;  Surgeon: Jovita Gamma, MD;  Location: [place];  Service: Neurosurgery;  Laterality: N/A;  . LEFT OOPHORECTOMY  1976  . PARTIAL HYSTERECTOMY     right ovary remains  . TONSILLECTOMY  1972    There were no vitals filed for this visit.  Subjective Assessment - 12/30/18 0805    Subjective  A little pain but not big. 4/10 L shoulder area,1/10 mid and low back pain currently. Has been pretty good this weekend.   Was a little sore after last session.     Pertinent History  Pain in thoracic spine, upper and lower back, L shoulder. Low back has not bothered her for about 3-4 weeks ago. Pain lasted a few days and it was gone.  Pt had R LE  radiating pain along the L5/S1 dermatome which also went away. R LE symptoms was present intermittently for a couple of weeks and its been fine afterwards.  Denies loss of bowel or bladder function or saddle anesthesia.  Pt states she constantly has pain in her mid back.  Pt also states pain in L shoulder with tingling behind her shoulder.  Had surgery to alleviate the cyst in 2018. Could not remove cyst since it was on the spine and the fluid was drained.  Fluid did not come back based on her MRI last year.    Had neck surgery June 2019 which helped with her neck pain and helped her regain strength in her L arm.  Still has pain along her L arm (C5/6 dermatome area).  Mid back pain began after her thoracic spine surgery.  Mid back pain has worsened since the neck surgery.  L shoulder area pain feels like it is slightly getting worse since onset.  Main pain from mid back is  from either standing or walking. Pt is R hand dominant but uses her L hand more than her R.     Low back pain began January 2020 suddenly, unknown mechanism of injury. Difficult to straighten up and walk.     Patient Stated Goals  Be able to walk and be up an about such as in stores more comfortably.     Currently in Pain?  Yes    Pain Score  4     Pain Onset  More than a month ago                               PT Education - 12/30/18 0823    Education Details  ther-ex    Person(s) Educated  Patient    Methods  Explanation;Demonstration;Tactile cues;Verbal cues    Comprehension  Returned demonstration;Verbalized understanding        Objective   No latex allergies Pt states that her blood pressure is controlled.  Neck surgery June 2019   Pt adds that she feels like there is a big pad at her L upper trap area (decreased sensation)    Medbridge Access Code: [facility]   March 23? Next MD visit   Manual therapy  Seated STM L and R upper trap muscle  Decreased L shoulder area pain      Therapeutic exercise   Seated manually resisted L scapular retraction targeting the lower trap muscles 10x5 seconds for 3 sets  Seated B shoulder extension isometrics, hands on thighs to promote trunk strengthening  10x5 seconds for 3 sets. No back pain afterwards.   Running man with one UE assist to promote glute muscle strengthening. Good muscle use felt  R 10x2  L 10x2  Standing LE leg press with B UE assist  R 10x2  L 10x2   Standing hip abduction 2 lbs with B UE assist  R 10x2  L 10x2  L anterior hip discomfort afterwards   Improved exercise technique, movement at target joints, use of target muscles after min to mod verbal, visual, tactile cues.   Response to treatment Good muscle use felt with exercises. Decreased L shoulder area pain. Slight L anterior hip discomfort after performing standing hip abduction exercises.     Clinical  impression Decreased L shoulder area pain with treatment to decrease upper trap muscle tension. Decreased low back pain with exercise to promote trunk strengthening. Worked on Product/process development scientist  to help decrease pressure to low back when performing standing activities.  Good carry over of decreased L shoulder pain from evaluation based on subjective reports. Patient will benefit from continued skilled physical therapy services to decrease pain, improve strength and function.         PT Short Term Goals - 12/23/18 1841      PT SHORT TERM GOAL #1   Title  Patient will be independent with her HEP to decrease pain, improve strength and function.     Baseline  Pt has started her HEP (12/23/2018)    Time  3    Period  Weeks    Status  New    Target Date  01/16/19        PT Long Term Goals - 12/23/18 1842      PT LONG TERM GOAL #1   Title  Patient will have a decrease in mid back pain to 4/10 or less at worst to promote ability to ambulate longer distances and improve ability to stand for longer periods.     Baseline  9/10 at worst for the past 3 months (12/23/2018)    Time  8    Period  Weeks    Status  New    Target Date  02/20/19      PT LONG TERM GOAL #2   Title  Patient will have a decrease in L shoulder pain to 3/10 or less at worst to promote ability to reach, and use her L UE for functional tasks.     Baseline  8/10 L shoulder pain at worst for the past 3 months (12/23/2018)    Time  8    Period  Weeks    Status  New    Target Date  02/20/19      PT LONG TERM GOAL #3   Title  Patient will have a decrease in low back pain to 3/10 or less at worst to promote ability to ambulate longer distances and improve ability to stand for longer periods.     Baseline  10+/10 at worst for the past 3 months (12/23/2018)    Time  8    Period  Weeks    Status  New    Target Date  02/20/19      PT LONG TERM GOAL #4   Title  Pt will improve L shoulder ER muscle strength by at least 1/2 MMT  grade to promote ability to raise her arm with less pain.     Baseline  4/5 (12/23/2018)    Time  8    Period  Weeks    Status  New    Target Date  02/20/19      PT LONG TERM GOAL #5   Title  Patient will improve B hip strength by at least 1/2 MMT grade to promote ability to ambulate longer distances, as well as perform standing tasks more comfortably for her back.     Time  8    Period  Weeks    Status  New    Target Date  02/20/19            Plan - 12/30/18 1610    Clinical Impression Statement  Decreased L shoulder area pain with treatment to decrease upper trap muscle tension. Decreased low back pain with exercise to promote trunk strengthening. Worked on glute strengthening to help decrease pressure to low back when performing standing activities.  Good carry over of decreased L shoulder pain  from evaluation based on subjective reports. Patient will benefit from continued skilled physical therapy services to decrease pain, improve strength and function.      Rehab Potential  Fair    Clinical Impairments Affecting Rehab Potential  (-) chronicity of condition, multiple areas of pain, weakness, multiple comorbidities; (+) motivated    PT Frequency  2x / week    PT Duration  8 weeks    PT Treatment/Interventions  Therapeutic exercise;Neuromuscular re-education;Therapeutic activities;Gait training;Patient/family education;Manual techniques;Aquatic Therapy;Electrical Stimulation;Iontophoresis 4mg /ml Dexamethasone    PT Next Visit Plan  Scapular strengthening, trunk and hip strengthening, manual techniques, modalities PRN    Consulted and Agree with Plan of Care  Patient       Patient will benefit from skilled therapeutic intervention in order to improve the following deficits and impairments:  Pain, Postural dysfunction, Improper body mechanics, Difficulty walking, Decreased strength  Visit Diagnosis: Pain in thoracic spine  Radiculopathy, cervical region  Chronic bilateral low  back pain, unspecified whether sciatica present  Muscle weakness (generalized)  Difficulty in walking, not elsewhere classified     Problem List Patient Active Problem List   Diagnosis Date Noted  . GERD (gastroesophageal reflux disease) 11/21/2017  . Complicated grief 89/21/1941  . Hypokalemia 05/14/2017  . Intradural arachnoid cyst of spine 01/31/2017  . Allergic reaction 11/12/2013  . Essential hypertension 12/06/2010  . MIGRAINE, CHRONIC 11/24/2010  . Vitamin D deficiency 04/08/2010  . DEPRESSION, MILD 03/09/2010  . RENAL CYST 12/22/2009  . OBESITY 08/11/2008  . NEOPLASM, MALIGNANT, SKIN, FACE 12/01/2007  . PATENT FORAMEN OVALE 01/28/2005  . HLD (hyperlipidemia) 03/30/2001    Joneen Boers PT, DPT   12/30/2018, 5:07 PM  [place] [facility] [address] [place], [address] Phone: [phone]   Fax:  [phone]  Name: Nicole Daniels MRN: [id number] Date of Birth: [birth year]

## 2019-01-01 ENCOUNTER — Encounter: Payer: Medicare Other | Admitting: Physical Therapy

## 2019-01-01 ENCOUNTER — Ambulatory Visit: Payer: Medicare Other

## 2019-01-01 DIAGNOSIS — R262 Difficulty in walking, not elsewhere classified: Secondary | ICD-10-CM

## 2019-01-01 DIAGNOSIS — M546 Pain in thoracic spine: Secondary | ICD-10-CM | POA: Diagnosis not present

## 2019-01-01 DIAGNOSIS — M6281 Muscle weakness (generalized): Secondary | ICD-10-CM

## 2019-01-01 DIAGNOSIS — M545 Low back pain, unspecified: Secondary | ICD-10-CM

## 2019-01-01 DIAGNOSIS — G8929 Other chronic pain: Secondary | ICD-10-CM

## 2019-01-01 DIAGNOSIS — M5412 Radiculopathy, cervical region: Secondary | ICD-10-CM

## 2019-01-01 NOTE — Therapy (Signed)
[place] [facility] [address] [place], [address] Phone: [phone]   Fax:  [phone]  Physical Therapy Treatment  Patient Details  Name: Nicole Daniels MRN: [id number] Date of Birth: 1951-01-29 Referring Provider (PT): Jovita Gamma, MD   Encounter Date: 01/01/2019  PT End of Session - 01/01/19 0907    Visit Number  3    Number of Visits  17    Date for PT Re-Evaluation  02/20/19    Authorization Type  3    Authorization Time Period  of 10 progress report (from 12/23/2018)    PT Start Time  0907    PT Stop Time  0937    PT Time Calculation (min)  30 min    Activity Tolerance  Patient tolerated treatment well    Behavior During Therapy  [facility] for tasks assessed/performed       Past Medical History:  Diagnosis Date  . Abdominal pain, right lower quadrant   . Acquired cyst of kidney 01/08/2012   pt denies history of   . Acute sinusitis, unspecified   . Adenomatous colon polyp   . Angiomyolipoma    kidney  . Chest pain   . Chest pain, unspecified   . Depressive disorder, not elsewhere classified 01/08/2012   not currently   . Diverticulosis   . Dizziness and giddiness   . Dysfunction of eustachian tube   . Hemorrhoids   . History of diverticulitis of colon   . IBS (irritable bowel syndrome)   . Liver lesion 2011  . Neoplasm of skin of face    malignant  . OA (osteoarthritis)    knee  . Obesity, unspecified   . Ostium secundum type atrial septal defect   . Other and unspecified hyperlipidemia   . Other malaise and fatigue   . Palpitations   . Shortness of breath   . Unspecified essential hypertension    pt states she does not have HTN  . Unspecified sinusitis (chronic)   . Unspecified vitamin D deficiency     Past Surgical History:  Procedure Laterality Date  . ABDOMINAL HYSTERECTOMY    . BREAST REDUCTION SURGERY  1990  . CHOLECYSTECTOMY    . COLONOSCOPY    . DILATION AND  CURETTAGE OF UTERUS    . EYE SURGERY Bilateral 07/30/2017   cataract sx  . FLEXIBLE SIGMOIDOSCOPY  01/08/2012   Procedure: FLEXIBLE SIGMOIDOSCOPY;  Surgeon: Lafayette Dragon, MD;  Location: [facility];  Service: Endoscopy;  Laterality: N/A;  . HEMORRHOID SURGERY    . KNEE ARTHROTOMY     pt states no   . LAMINECTOMY N/A 01/31/2017   Procedure: Thoracic Laminectomy and marsupialization of arachnoid cyst;  Surgeon: Jovita Gamma, MD;  Location: [place];  Service: Neurosurgery;  Laterality: N/A;  . LEFT OOPHORECTOMY  1976  . PARTIAL HYSTERECTOMY     right ovary remains  . TONSILLECTOMY  1972    There were no vitals filed for this visit.  Subjective Assessment - 01/01/19 0908    Subjective  No pain but just tightness, L upper trap area. Was ok after last session.     Pertinent History  Pain in thoracic spine, upper and lower back, L shoulder. Low back has not bothered her for about 3-4 weeks ago. Pain lasted a few days and it was gone.  Pt had R LE radiating pain along the L5/S1 dermatome which also went away. R LE symptoms was present intermittently for  a couple of weeks and its been fine afterwards.  Denies loss of bowel or bladder function or saddle anesthesia.  Pt states she constantly has pain in her mid back.  Pt also states pain in L shoulder with tingling behind her shoulder.  Had surgery to alleviate the cyst in 2018. Could not remove cyst since it was on the spine and the fluid was drained.  Fluid did not come back based on her MRI last year.    Had neck surgery June 2019 which helped with her neck pain and helped her regain strength in her L arm.  Still has pain along her L arm (C5/6 dermatome area).  Mid back pain began after her thoracic spine surgery.  Mid back pain has worsened since the neck surgery.  L shoulder area pain feels like it is slightly getting worse since onset.  Main pain from mid back is from either standing or walking. Pt is R hand dominant but uses her L hand more than her  R.     Low back pain began January 2020 suddenly, unknown mechanism of injury. Difficult to straighten up and walk.     Patient Stated Goals  Be able to walk and be up an about such as in stores more comfortably.     Currently in Pain?  No/denies    Pain Score  0-No pain    Pain Onset  More than a month ago                               PT Education - 01/01/19 0923    Education Details  ther-ex, HEP    Person(s) Educated  Patient    Methods  Explanation;Demonstration;Tactile cues;Verbal cues;Handout    Comprehension  Returned demonstration;Verbalized understanding       Objective   No latex allergies Pt states that her blood pressure is controlled.  Neck surgery June 2019   Pt adds that she feels like there is a big pad at her L upper trap area (decreased sensation)  (01/01/2019: the pad feels like it is thinning)      MedbridgeAccess Code: [facility]  March 23? Next MD visit   Manual therapy  Seated STM L and R upper trap muscle  Decreased L shoulder area pain      Therapeutic exercise   Seated manually resisted L scapular retraction targeting the lower trap muscles 10x5 seconds for 3 sets  Seated B shoulder extension isometrics, hands on thighs to promote trunk strengthening             10x5 seconds for 3 sets.    S/L hip abduction   R 10x2  L 10x2  Running man with one UE assist to promote glute muscle strengthening. Good muscle use felt             R 10x2             L 10x2  Standing LE leg press with B UE assist             R 10x3             L 10x3    Improved exercise technique, movement at target joints, use of target muscles after min to mod verbal, visual, tactile cues.   Response to treatment Good muscle use felt with exercises. Decreased L shoulder area tightness. No complain of pain throughout session.    Clinical impression Continued  working on scapular strengthening to help decrease L upper  trap muscle tension as well as trunk, and glute strengthening to help decrease low back pain. Pt making very good progress towards pain goals based on pt subjective reports. Pt will benefit from continued skilled physical therapy services to decrease pain, improve strength and function.      PT Short Term Goals - 12/23/18 1841      PT SHORT TERM GOAL #1   Title  Patient will be independent with her HEP to decrease pain, improve strength and function.     Baseline  Pt has started her HEP (12/23/2018)    Time  3    Period  Weeks    Status  New    Target Date  01/16/19        PT Long Term Goals - 12/23/18 1842      PT LONG TERM GOAL #1   Title  Patient will have a decrease in mid back pain to 4/10 or less at worst to promote ability to ambulate longer distances and improve ability to stand for longer periods.     Baseline  9/10 at worst for the past 3 months (12/23/2018)    Time  8    Period  Weeks    Status  New    Target Date  02/20/19      PT LONG TERM GOAL #2   Title  Patient will have a decrease in L shoulder pain to 3/10 or less at worst to promote ability to reach, and use her L UE for functional tasks.     Baseline  8/10 L shoulder pain at worst for the past 3 months (12/23/2018)    Time  8    Period  Weeks    Status  New    Target Date  02/20/19      PT LONG TERM GOAL #3   Title  Patient will have a decrease in low back pain to 3/10 or less at worst to promote ability to ambulate longer distances and improve ability to stand for longer periods.     Baseline  10+/10 at worst for the past 3 months (12/23/2018)    Time  8    Period  Weeks    Status  New    Target Date  02/20/19      PT LONG TERM GOAL #4   Title  Pt will improve L shoulder ER muscle strength by at least 1/2 MMT grade to promote ability to raise her arm with less pain.     Baseline  4/5 (12/23/2018)    Time  8    Period  Weeks    Status  New    Target Date  02/20/19      PT LONG TERM GOAL #5   Title   Patient will improve B hip strength by at least 1/2 MMT grade to promote ability to ambulate longer distances, as well as perform standing tasks more comfortably for her back.     Time  8    Period  Weeks    Status  New    Target Date  02/20/19            Plan - 01/01/19 8546    Clinical Impression Statement  Continued working on scapular strengthening to help decrease L upper trap muscle tension as well as trunk, and glute strengthening to help decrease low back pain. Pt making very good progress towards pain goals based on pt  subjective reports. Pt will benefit from continued skilled physical therapy services to decrease pain, improve strength and function.     Rehab Potential  Fair    Clinical Impairments Affecting Rehab Potential  (-) chronicity of condition, multiple areas of pain, weakness, multiple comorbidities; (+) motivated    PT Frequency  2x / week    PT Duration  8 weeks    PT Treatment/Interventions  Therapeutic exercise;Neuromuscular re-education;Therapeutic activities;Gait training;Patient/family education;Manual techniques;Aquatic Therapy;Electrical Stimulation;Iontophoresis 4mg /ml Dexamethasone    PT Next Visit Plan  Scapular strengthening, trunk and hip strengthening, manual techniques, modalities PRN    Consulted and Agree with Plan of Care  Patient       Patient will benefit from skilled therapeutic intervention in order to improve the following deficits and impairments:  Pain, Postural dysfunction, Improper body mechanics, Difficulty walking, Decreased strength  Visit Diagnosis: Pain in thoracic spine  Radiculopathy, cervical region  Chronic bilateral low back pain, unspecified whether sciatica present  Muscle weakness (generalized)  Difficulty in walking, not elsewhere classified     Problem List Patient Active Problem List   Diagnosis Date Noted  . GERD (gastroesophageal reflux disease) 11/21/2017  . Complicated grief 01/74/9449  . Hypokalemia  05/14/2017  . Intradural arachnoid cyst of spine 01/31/2017  . Allergic reaction 11/12/2013  . Essential hypertension 12/06/2010  . MIGRAINE, CHRONIC 11/24/2010  . Vitamin D deficiency 04/08/2010  . DEPRESSION, MILD 03/09/2010  . RENAL CYST 12/22/2009  . OBESITY 08/11/2008  . NEOPLASM, MALIGNANT, SKIN, FACE 12/01/2007  . PATENT FORAMEN OVALE 01/28/2005  . HLD (hyperlipidemia) 03/30/2001   Joneen Boers PT, DPT   01/01/2019, 9:59 AM  [place] [facility] [address], [place], [address] Phone: [phone]   Fax:  [phone]  Name: Nicole Daniels MRN: [id number] Date of Birth: [birth year]

## 2019-01-01 NOTE — Patient Instructions (Signed)
  Sitting on a chair    Press your hands on your thighs to feel your abdominal muscles contract.   Hold for 5 seconds comfortably.   Repeat 10 times.   Perform 3 sets daily.

## 2019-01-06 ENCOUNTER — Encounter: Payer: Medicare Other | Admitting: Physical Therapy

## 2019-01-07 ENCOUNTER — Ambulatory Visit: Payer: Medicare Other

## 2019-01-08 ENCOUNTER — Encounter: Payer: Medicare Other | Admitting: Physical Therapy

## 2019-01-09 ENCOUNTER — Ambulatory Visit: Payer: Medicare Other

## 2019-01-13 ENCOUNTER — Ambulatory Visit: Payer: Medicare Other

## 2019-01-13 ENCOUNTER — Encounter: Payer: Medicare Other | Admitting: Physical Therapy

## 2019-01-15 ENCOUNTER — Encounter: Payer: Medicare Other | Admitting: Physical Therapy

## 2019-01-16 ENCOUNTER — Ambulatory Visit: Payer: Medicare Other

## 2019-01-20 ENCOUNTER — Encounter: Payer: Medicare Other | Admitting: Physical Therapy

## 2019-01-21 ENCOUNTER — Telehealth (INDEPENDENT_AMBULATORY_CARE_PROVIDER_SITE_OTHER): Payer: Medicare Other | Admitting: Family Medicine

## 2019-01-21 ENCOUNTER — Encounter: Payer: Self-pay | Admitting: Family Medicine

## 2019-01-21 DIAGNOSIS — J0101 Acute recurrent maxillary sinusitis: Secondary | ICD-10-CM | POA: Diagnosis not present

## 2019-01-21 NOTE — Assessment & Plan Note (Signed)
Given duration and progression of symptoms, will treat for bacterial sinusitis with Augmentin. Continue supportive care. Call or return to clinic prn if these symptoms worsen or fail to improve as anticipated.' The patient indicates understanding of these issues and agrees with the plan.

## 2019-01-21 NOTE — Progress Notes (Signed)
Pt agrees to Tele-Visit/Pt is C/O Sx that started at least 3-weeks-ago with nasal stuffiness.  Nasal mucous is yellow-green but the pressure is moderate. She has an intermittent cough that is nonproductive. Pressure is frontal and periorbital and nasal. She is fatigued and is "wearing me out being sick makes me real tired." She has been Tx with OTC Allergy and occas Mucinex, and then bought something called Sinus Severe but has not taken it yet and is waiting on instructions.

## 2019-01-21 NOTE — Progress Notes (Signed)
   TELEPHONE ENCOUNTER   Patient verbally agreed to telephone visit and is aware that copayment and coinsurance may apply. Patient was treated using telemedicine according to accepted telemedicine protocols.  Location of the patient- home Names of all persons participating in the telemedicine service and role in the encounter: Arnette Norris, MD Virl Cagey, Vilma Meckel, CMA  Subjective:     HPI   ? Sinus infection- at least 3-weeks-ago, started to develop nasal stuffiness.  Nasal mucous is yellow-green but the pressure is moderate. She has an intermittent cough that is nonproductive.  Now having sinus pressure-  Pressure is frontal and periorbital and nasal. She is fatigued and is "wearing me out being sick makes me real tired."   She has been Tx with OTC Allergy and occas Mucinex, and then bought something called Sinus Severe but has not taken it yet and is waiting on instructions.  Patient Active Problem List   Diagnosis Date Noted  . GERD (gastroesophageal reflux disease) 11/21/2017  . Complicated grief 15/17/6160  . Hypokalemia 05/14/2017  . Intradural arachnoid cyst of spine 01/31/2017  . Allergic reaction 11/12/2013  . Essential hypertension 12/06/2010  . MIGRAINE, CHRONIC 11/24/2010  . Vitamin D deficiency 04/08/2010  . DEPRESSION, MILD 03/09/2010  . RENAL CYST 12/22/2009  . OBESITY 08/11/2008  . NEOPLASM, MALIGNANT, SKIN, FACE 12/01/2007  . PATENT FORAMEN OVALE 01/28/2005  . HLD (hyperlipidemia) 03/30/2001   Social History   Tobacco Use  . 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 Smokeless tobacco: Never Used  Substance Use Topics  . Alcohol use: No    Current Outpatient Medications:  .  Cholecalciferol (VITAMIN D3) 2000 units TABS, Take 2,000 Units by mouth daily., Disp: , Rfl:  .  fluticasone (FLONASE) 50 MCG/ACT nasal spray, Place 1 spray into both nostrils as needed for allergies. , Disp: , Rfl:  .  gabapentin (NEURONTIN) 300 MG capsule, 1 by mouth at  night for 7 days then 1 by mouth twice per day for 7 days then 1 by mouth three times per day. (Patient taking differently: Take 300 mg by mouth See admin instructions. Pt states taking one dose per week.), Disp: 180 capsule, Rfl: 1 .  hydrochlorothiazide (HYDRODIURIL) 25 MG tablet, TAKE 1 TABLET (25 MG TOTAL) BY MOUTH AT BEDTIME., Disp: 90 tablet, Rfl: 2 .  tiZANidine (ZANAFLEX) 4 MG tablet, Take 1 tablet (4 mg total) by mouth every 6 (six) hours as needed for muscle spasms. (Patient not taking: Reported on 12/23/2018), Disp: 30 tablet, Rfl: 3 Allergies  Allergen Reactions  . Simvastatin Other (See Comments)    MYALGIAS  . Statins Other (See Comments)    Has severe leg muscle aches and cramps  . Bactrim [Sulfamethoxazole-Trimethoprim] Itching    Assessment & Plan:   No diagnosis found.  No orders of the defined types were placed in this encounter.  No orders of the defined types were placed in this encounter.   Arnette Norris, MD 01/21/2019  Time spent with the patient: 10 minutes, spent in obtaining information about her symptoms, reviewing her previous labs, evaluations, and treatments, counseling her about her condition (please see the discussed topics above), and developing a plan to further investigate it; she had a number of questions which I addressed.   [id number] physician/qualified health professional telephone evaluation 5 to 10 minutes [id number] physician/qualified help functional [place] evaluation for 11 to 20 minutes [id number] physician/qualify he will professional telephone evaluation for 21 to 30 minutes

## 2019-01-22 ENCOUNTER — Ambulatory Visit: Payer: Medicare Other

## 2019-01-22 ENCOUNTER — Encounter: Payer: Medicare Other | Admitting: Physical Therapy

## 2019-01-22 ENCOUNTER — Telehealth: Payer: Self-pay | Admitting: Family Medicine

## 2019-01-22 MED ORDER — AMOXICILLIN-POT CLAVULANATE 875-125 MG PO TABS: 1.0000 | ORAL_TABLET | Freq: Two times a day (BID) | ORAL | 0 refills | Status: AC

## 2019-01-22 NOTE — Addendum Note (Signed)
Addended by: Lucille Passy on: 01/22/2019 04:27 PM   Modules accepted: Orders

## 2019-01-22 NOTE — Telephone Encounter (Signed)
Patient left a voicemail in regards to her prescription for Augmentin she was told that would be sent in for her yesterday. She has called her pharmacy and it is not there and not on current medication list.

## 2019-01-22 NOTE — Telephone Encounter (Signed)
Dr. Aron - please advise 

## 2019-01-22 NOTE — Telephone Encounter (Signed)
Not sure how that happened.  Just resent to [facility].  I am SO sorry!

## 2019-01-23 NOTE — Telephone Encounter (Signed)
Pt aware of medication being sent to pharmacy on file.

## 2019-01-27 ENCOUNTER — Encounter: Payer: Medicare Other | Admitting: Physical Therapy

## 2019-02-25 NOTE — Progress Notes (Signed)
Virtual Visit via Video Note  I connected with patient on 02/26/19 at  9:00 AM EDT by a video enabled telemedicine application and verified that I am speaking with the correct person using two identifiers.   THIS ENCOUNTER IS A VIRTUAL VISIT DUE TO COVID-19 - PATIENT WAS NOT SEEN IN THE OFFICE. PATIENT HAS CONSENTED TO VIRTUAL VISIT / TELEMEDICINE VISIT   Location of patient: home  Location of provider: office  I discussed the limitations of evaluation and management by telemedicine and the availability of in person appointments. The patient expressed understanding and agreed to proceed.   Subjective:   Nicole Daniels is a 67 y.o. female who presents for Medicare Annual (Subsequent) preventive examination.  Review of Systems: No ROS.  Medicare Wellness Visit. Additional risk factors are reflected in the social history. Cardiac Risk Factors include: advanced age (>4men, >35 women);dyslipidemia;hypertension Sleep patterns: no issues Home Safety/Smoke Alarms: Feels safe in home. Smoke alarms in place.  Lives alone in 1 story home w/ basement laundry.    Female:        Mammo- 07/11/18       Dexa scan-07/11/18        CCS- pt declines. Last in 2011. Past due as of 2016.will defer to PCP    Objective:     Vitals: Unable to assess. This visit is enabled though telemedicine due to Covid 19.  Advanced Directives 02/26/2019 12/23/2018 02/20/2018 05/14/2017 02/01/2017 02/01/2017 01/23/2017  Does Patient Have a Medical Advance Directive? Yes Yes Yes Yes Yes Yes Yes  Type of Paramedic of [place];Living will Living will;Healthcare Power of [place];Out of facility DNR (pink MOST or yellow form) [place];Living will Living will [place];Living will [place];Living will [place];Living will  Does patient want to make changes to medical advance directive? No - Patient declined No - Patient  declined No - Patient declined No - Patient declined Yes (Inpatient - patient requests chaplain consult to change a medical advance directive) Yes (Inpatient - patient requests chaplain consult to change a medical advance directive) Yes (Inpatient - patient requests chaplain consult to change a medical advance directive)  Copy of [place] in Chart? Yes - validated most recent copy scanned in chart (See row information) - No - copy requested - Yes No - copy requested No - copy requested  Pre-existing out of facility DNR order (yellow form or pink MOST form) - - - - - - -    Tobacco Social History   Tobacco Use  Smoking Status Never Smoker  Smokeless Tobacco Never Used     Counseling given: Not Answered   Clinical Intake: Pain : No/denies pain    Past Medical History:  Diagnosis Date  . Abdominal pain, right lower quadrant   . Acquired cyst of kidney 01/08/2012   pt denies history of   . Acute sinusitis, unspecified   . Adenomatous colon polyp   . Angiomyolipoma    kidney  . Chest pain   . Chest pain, unspecified   . Depressive disorder, not elsewhere classified 01/08/2012   not currently   . Diverticulosis   . Dizziness and giddiness   . Dysfunction of eustachian tube   . Hemorrhoids   . History of diverticulitis of colon   . IBS (irritable bowel syndrome)   . Liver lesion 2011  . Neoplasm of skin of face    malignant  . OA (osteoarthritis)  knee  . Obesity, unspecified   . Ostium secundum type atrial septal defect   . Other and unspecified hyperlipidemia   . Other malaise and fatigue   . Palpitations   . Shortness of breath   . Unspecified essential hypertension    pt states she does not have HTN  . Unspecified sinusitis (chronic)   . Unspecified vitamin D deficiency    Past Surgical History:  Procedure Laterality Date  . ABDOMINAL HYSTERECTOMY    . BREAST REDUCTION SURGERY  1990  . CERVICAL DISC SURGERY     Dr.Nudleman  .  CHOLECYSTECTOMY    . COLONOSCOPY    . DILATION AND CURETTAGE OF UTERUS    . EYE SURGERY Bilateral 07/30/2017   cataract sx  . FLEXIBLE SIGMOIDOSCOPY  01/08/2012   Procedure: FLEXIBLE SIGMOIDOSCOPY;  Surgeon: Lafayette Dragon, MD;  Location: [facility];  Service: Endoscopy;  Laterality: N/A;  . HEMORRHOID SURGERY    . KNEE ARTHROTOMY     pt states no   . LAMINECTOMY N/A 01/31/2017   Procedure: Thoracic Laminectomy and marsupialization of arachnoid cyst;  Surgeon: Jovita Gamma, MD;  Location: [place];  Service: Neurosurgery;  Laterality: N/A;  . LEFT OOPHORECTOMY  1976  . PARTIAL HYSTERECTOMY     right ovary remains  . TONSILLECTOMY  1972   Family History  Problem Relation Age of Onset  . Cirrhosis Father   . Heart disease Father   . Hypertension Mother   . Diabetes Mother   . Breast cancer Cousin   . Hypertension Sister   . Diabetes Sister   . Diabetes Cousin   . Diabetes Other        aunt  . Stroke Other        aunt  . Heart disease Brother        x 2  . Colon cancer Neg Hx    Social History   Socioeconomic History  . Marital status: Married    Spouse name: Not on file  . Number of children: 2  . Years of education: Not on file  . Highest education level: Not on file  Occupational History  . Occupation: Physicist, medical for [employer]  . Financial resource strain: Not on file  . Food insecurity:    Worry: Not on file    Inability: Not on file  . Transportation needs:    Medical: Not on file    Non-medical: Not on file  Tobacco Use  . 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 Smokeless tobacco: Never Used  Substance and Sexual Activity  . Alcohol use: No  . Drug use: No  . Sexual activity: Yes  Lifestyle  . Physical activity:    Days per week: Not on file    Minutes per session: Not on file  . Stress: Not on file  Relationships  . Social connections:    Talks on phone: Not on file    Gets together: Not on file    Attends religious service: Not on  file    Active member of club or organization: Not on file    Attends meetings of clubs or organizations: Not on file    Relationship status: Not on file  Other Topics Concern  . Not on file  Social History Narrative   Daily caffeine     Outpatient Encounter Medications as of 02/26/2019  Medication Sig  . Cholecalciferol (VITAMIN D3) 2000 units TABS Take 2,000 Units by mouth daily.  Marland Kitchen  fluticasone (FLONASE) 50 MCG/ACT nasal spray Place 1 spray into both nostrils as needed for allergies.   . hydrochlorothiazide (HYDRODIURIL) 25 MG tablet TAKE 1 TABLET (25 MG TOTAL) BY MOUTH AT BEDTIME. (Patient taking differently: TAKE 1 TABLET (25 MG TOTAL) BY MOUTH every morning)  . tiZANidine (ZANAFLEX) 4 MG tablet Take 1 tablet (4 mg total) by mouth every 6 (six) hours as needed for muscle spasms. (Patient not taking: Reported on 12/23/2018)  . [DISCONTINUED] gabapentin (NEURONTIN) 300 MG capsule 1 by mouth at night for 7 days then 1 by mouth twice per day for 7 days then 1 by mouth three times per day. (Patient not taking: Reported on 02/26/2019)   No facility-administered encounter medications on file as of 02/26/2019.     Activities of Daily Living In your present state of health, do you have any difficulty performing the following activities: 02/26/2019  Hearing? N  Vision? N  Difficulty concentrating or making decisions? N  Walking or climbing stairs? N  Dressing or bathing? N  Doing errands, shopping? N  Preparing Food and eating ? N  Using the Toilet? N  In the past six months, have you accidently leaked urine? N  Do you have problems with loss of bowel control? N  Managing your Medications? N  Managing your Finances? N  Housekeeping or managing your Housekeeping? N  Some recent data might be hidden    Patient Care Team: Lucille Passy, MD as PCP - General (Family Medicine)    Assessment:   This is a routine wellness examination for Babita. Physical assessment deferred to PCP.   Exercise Activities and Dietary recommendations Current Exercise Habits: The patient does not participate in regular exercise at present, Exercise limited by: None identified   Diet (meal preparation, eat out, water intake, caffeinated beverages, dairy products, fruits and vegetables): well balanced, on average, 3 meals per day   Goals    . Eliminating stressors.    . Increase physical activity     Starting 01/01/2017, I will continue to exercise at least 30 min 1-2 days per week.        Fall Risk Fall Risk  02/26/2019 02/20/2018 01/01/2017  Falls in the past year? 0 No Yes  Comment - - pt fell off porch  Number falls in past yr: - - 1  Injury with Fall? - - No     Depression Screen PHQ 2/9 Scores 02/26/2019 02/20/2018 11/21/2017 01/01/2017  PHQ - 2 Score 0 0 3 0  PHQ- 9 Score - - 4 -     Cognitive Function Ad8 score reviewed for issues:  Issues making decisions:no  Less interest in hobbies / activities:no  Repeats questions, stories (family complaining):no  Trouble using ordinary gadgets (microwave, computer, phone):no  Forgets the month or year: no  Mismanaging finances: no  Remembering appts:no  Daily problems with thinking and/or memory:no Ad8 score is=0     MMSE - Mini Mental State Exam 02/20/2018 01/01/2017  Orientation to time 5 5  Orientation to Place 5 5  Registration 3 3  Attention/ Calculation 5 0  Recall 3 3  Language- name 2 objects 2 0  Language- repeat 1 1  Language- follow 3 step command 3 3  Language- read & follow direction 1 0  Write a sentence 1 0  Copy design 1 0  Total score 30 20        Immunization History  Administered Date(s) Administered  . Influenza,inj,Quad PF,6+ Mos 09/01/2015  . Td  10/30/1988, 08/25/2008    Screening Tests Health Maintenance  Topic Date Due  . TETANUS/TDAP  08/25/2018  . INFLUENZA VACCINE  01/28/2024 (Originally 05/31/2019)  . PNA vac Low Risk Adult (1 of 2 - PCV13) 01/01/2026 (Originally 03/01/2015)  .  COLONOSCOPY  01/14/2020  . MAMMOGRAM  07/11/2020  . DEXA SCAN  Completed  . Hepatitis C Screening  Completed       Plan:   See you next year!   Continue to eat heart healthy diet (full of fruits, vegetables, whole grains, lean protein, water--limit salt, fat, and sugar intake) and increase physical activity as tolerated.  Continue doing brain stimulating activities (puzzles, reading, adult coloring books, staying active) to keep memory sharp.   Discuss colonoscopy with PCP at next visit.  I have personally reviewed and noted the following in the patient's chart:   . Medical and social history . Use of alcohol, tobacco or illicit drugs  . Current medications and supplements . Functional ability and status . Nutritional status . Physical activity . Advanced directives . List of other physicians . Hospitalizations, surgeries, and ER visits in previous 12 months . Vitals . Screenings to include cognitive, depression, and falls . Referrals and appointments  In addition, I have reviewed and discussed with patient certain preventive protocols, quality metrics, and best practice recommendations. A written personalized care plan for preventive services as well as general preventive health recommendations were provided to patient.     Naaman Plummer [place], [place]  02/26/2019

## 2019-02-26 ENCOUNTER — Ambulatory Visit (INDEPENDENT_AMBULATORY_CARE_PROVIDER_SITE_OTHER): Payer: Medicare Other | Admitting: *Deleted

## 2019-02-26 ENCOUNTER — Encounter: Payer: Self-pay | Admitting: *Deleted

## 2019-02-26 DIAGNOSIS — Z0000 Encounter for general adult medical examination without abnormal findings: Secondary | ICD-10-CM | POA: Diagnosis not present

## 2019-02-26 NOTE — Progress Notes (Signed)
I reviewed health advisor's note, was available for consultation, and agree with documentation and plan.  

## 2019-02-26 NOTE — Patient Instructions (Signed)
See you next year!   Continue to eat heart healthy diet (full of fruits, vegetables, whole grains, lean protein, water--limit salt, fat, and sugar intake) and increase physical activity as tolerated.  Continue doing brain stimulating activities (puzzles, reading, adult coloring books, staying active) to keep memory sharp.   Discuss colonoscopy with PCP at next visit.   Nicole Daniels , Thank you for taking time to come for your Medicare Wellness Visit. I appreciate your ongoing commitment to your health goals. Please review the following plan we discussed and let me know if I can assist you in the future.   These are the goals we discussed: Goals    . Eliminating stressors.    . Increase physical activity     Starting 01/01/2017, I will continue to exercise at least 30 min 1-2 days per week.        This is a list of the screening recommended for you and due dates:  Health Maintenance  Topic Date Due  . Tetanus Vaccine  08/25/2018  . Flu Shot  01/28/2024*  . Pneumonia vaccines (1 of 2 - PCV13) 01/01/2026*  . Colon Cancer Screening  01/14/2020  . Mammogram  07/11/2020  . DEXA scan (bone density measurement)  Completed  .  Hepatitis C: One time screening is recommended by Center for Disease Control  (CDC) for  adults born from [birth year] through 1966.   Completed  *Topic was postponed. The date shown is not the original due date.    Health Maintenance After Age 40 After age 3, you are at a higher risk for certain long-term diseases and infections as well as injuries from falls. Falls are a major cause of broken bones and head injuries in people who are older than age 55. Getting regular preventive care can help to keep you healthy and well. Preventive care includes getting regular testing and making lifestyle changes as recommended by your health care provider. Talk with your health care provider about:  Which screenings and tests you should have. A screening is a test that checks for a  disease when you have no symptoms.  A diet and exercise plan that is right for you. What should I know about screenings and tests to prevent falls? Screening and testing are the best ways to find a health problem early. Early diagnosis and treatment give you the best chance of managing medical conditions that are common after age 30. Certain conditions and lifestyle choices may make you more likely to have a fall. Your health care provider may recommend:  Regular vision checks. Poor vision and conditions such as cataracts can make you more likely to have a fall. If you wear glasses, make sure to get your prescription updated if your vision changes.  Medicine review. Work with your health care provider to regularly review all of the medicines you are taking, including over-the-counter medicines. Ask your health care provider about any side effects that may make you more likely to have a fall. Tell your health care provider if any medicines that you take make you feel dizzy or sleepy.  Osteoporosis screening. Osteoporosis is a condition that causes the bones to get weaker. This can make the bones weak and cause them to break more easily.  Blood pressure screening. Blood pressure changes and medicines to control blood pressure can make you feel dizzy.  Strength and balance checks. Your health care provider may recommend certain tests to check your strength and balance while standing, walking, or changing positions.  Foot health exam. Foot pain and numbness, as well as not wearing proper footwear, can make you more likely to have a fall.  Depression screening. You may be more likely to have a fall if you have a fear of falling, feel emotionally low, or feel unable to do activities that you used to do.  Alcohol use screening. Using too much alcohol can affect your balance and may make you more likely to have a fall. What actions can I take to lower my risk of falls? General instructions  Talk with  your health care provider about your risks for falling. Tell your health care provider if: ? You fall. Be sure to tell your health care provider about all falls, even ones that seem minor. ? You feel dizzy, sleepy, or off-balance.  Take over-the-counter and prescription medicines only as told by your health care provider. These include any supplements.  Eat a healthy diet and maintain a healthy weight. A healthy diet includes low-fat dairy products, low-fat (lean) meats, and fiber from whole grains, beans, and lots of fruits and vegetables. Home safety  Remove any tripping hazards, such as rugs, cords, and clutter.  Install safety equipment such as grab bars in bathrooms and safety rails on stairs.  Keep rooms and walkways well-lit. Activity   Follow a regular exercise program to stay fit. This will help you maintain your balance. Ask your health care provider what types of exercise are appropriate for you.  If you need a cane or walker, use it as recommended by your health care provider.  Wear supportive shoes that have nonskid soles. Lifestyle  Do not drink alcohol if your health care provider tells you not to drink.  If you drink alcohol, limit how much you have: ? 0-1 drink a day for women. ? 0-2 drinks a day for men.  Be aware of how much alcohol is in your drink. In the [place] one drink equals one typical bottle of beer (12 oz), one-half glass of wine (5 oz), or one shot of hard liquor (1 oz).  Do not use any products that contain nicotine or tobacco, such as cigarettes and e-cigarettes. If you need help quitting, ask your health care provider. Summary  Having a healthy lifestyle and getting preventive care can help to protect your health and wellness after age 36.  Screening and testing are the best way to find a health problem early and help you avoid having a fall. Early diagnosis and treatment give you the best chance for managing medical conditions that are more common  for people who are older than age 70.  Falls are a major cause of broken bones and head injuries in people who are older than age 43. Take precautions to prevent a fall at home.  Work with your health care provider to learn what changes you can make to improve your health and wellness and to prevent falls. This information is not intended to replace advice given to you by your health care provider. Make sure you discuss any questions you have with your health care provider. Document Released: 08/29/2017 Document Revised: 08/29/2017 Document Reviewed: 08/29/2017 Elsevier Interactive Patient Education  2019 [employer].

## 2019-03-26 ENCOUNTER — Telehealth: Payer: Self-pay

## 2019-03-26 NOTE — Telephone Encounter (Signed)
TA-Pt is unsure if she is having issues with stress or anxiety/she is having issues with being on edge, aggravated, downward spiral/thx dmf

## 2019-03-26 NOTE — Telephone Encounter (Signed)
Copied from [place] [id number]. Topic: General - Other >> Mar 25, 2019  5:02 PM Keene Breath wrote: Reason for CRM: Patient called to request a call from the doctor to discuss some medical issues she was having.  Please advise and call patient back at [phone]

## 2019-03-26 NOTE — Telephone Encounter (Signed)
TA-I spoke more in depth with pt about her Sx/she is having a problem with stress and anxiety/constantly on edge, aggravated, and feels like she is going in a downward spiral/she had an AWV with Glenard Haring in April/She is requesting to be able to restart Sertraline 25mg  1qd for this situational anxiety  I advised her that if you were in agreement with this then I would also have to schedule her a visit within 2 weeks to evaluate how she is doing and if any changes need to be made/plz advise if you are in agreement with restarting low-dose sertraline and F/U with appt with you W/I 2 weeks/thx dmf

## 2019-03-27 MED ORDER — SERTRALINE HCL 25 MG PO TABS: 25.0000 mg | ORAL_TABLET | Freq: Every day | ORAL | 0 refills | Status: DC

## 2019-03-27 NOTE — Telephone Encounter (Signed)
LMOVM that TA is in agreement with plan/Rx sent in with instructions for her to call office and sched virtual visit w/i 2 weeks of starting to ensure that  She is headed in the right direction/thx dmf

## 2019-03-27 NOTE — Telephone Encounter (Signed)
Yes I am in agreement.

## 2019-03-27 NOTE — Addendum Note (Signed)
Addended by: Marrion Coy on: 03/27/2019 04:08 PM   Modules accepted: Orders

## 2019-04-08 ENCOUNTER — Telehealth: Payer: Self-pay

## 2019-04-08 ENCOUNTER — Ambulatory Visit: Payer: Self-pay

## 2019-04-08 NOTE — Telephone Encounter (Signed)
Called patient and left a message that the PT clinic has re-opened if she would like to continue in person treatment. Also that there is a telehealth option if she would like to continue that way. Return phone call requested. Phone number [phone]) provided.

## 2019-04-08 NOTE — Telephone Encounter (Signed)
Patient called and says she was taking Sertraline 25 mg 1/2 pill x 7 days and feeling sluggish, fuzzy headed, drained in her body. She says last night she took the whole pill, 25 mg. Today she says she's feeling shaky and nervous, no energy and about 1 hour ago was feeling week and had to sit down. She says she's concerned and would like to know what to do from here. I advised I will send this to Dr. Deborra Medina and someone will call back with her recommendation. She asks to be called on her mobile number listed.  Answer Assessment - Initial Assessment Questions 1. SYMPTOMS: "Do you have any symptoms?"     Felt weak and needed to sit down, body feeling shaky, nervous; no energy 2. SEVERITY: If symptoms are present, ask "Are they mild, moderate or severe?"     Moderate  Protocols used: MEDICATION QUESTION CALL-A-AH

## 2019-04-09 NOTE — Telephone Encounter (Signed)
TA-Pt is having SE of Sertraline/I advised her to hold the medication till I called her back/she has decided that she does not want to be on anything that she has to take everyday as this scared her/she would like for you to consider something that she can take on a prn basis only/plz advise/thx dmf

## 2019-04-09 NOTE — Telephone Encounter (Signed)
Pt agrees to [email or web address] appt to her gmail for 6.11.20 at 10:40am/thx dmf

## 2019-04-09 NOTE — Telephone Encounter (Signed)
We will need to do a virtual visit to discuss this further for the best patient care.  Can we work her into my schedule for tomorrow?

## 2019-04-10 ENCOUNTER — Ambulatory Visit (INDEPENDENT_AMBULATORY_CARE_PROVIDER_SITE_OTHER): Payer: Medicare Other | Admitting: Family Medicine

## 2019-04-10 ENCOUNTER — Encounter: Payer: Self-pay | Admitting: Family Medicine

## 2019-04-10 VITALS — Wt 175.0 lb

## 2019-04-10 DIAGNOSIS — F419 Anxiety disorder, unspecified: Secondary | ICD-10-CM | POA: Diagnosis not present

## 2019-04-10 MED ORDER — LORAZEPAM 0.5 MG PO TABS: 0.2500 mg | ORAL_TABLET | Freq: Two times a day (BID) | ORAL | 0 refills | Status: DC | PRN

## 2019-04-10 NOTE — Progress Notes (Signed)
Virtual Visit via Video   Due to the COVID-19 pandemic, this visit was completed with telemedicine (audio/video) technology to reduce patient and provider exposure as well as to preserve personal protective equipment.   I connected with Nicole Daniels by a video enabled telemedicine application and verified that I am speaking with the correct person using two identifiers. Location patient: Home Location provider: [facility], Office Persons participating in the virtual visit: DAMALI BROADFOOT, Arnette Norris, MD   I discussed the limitations of evaluation and management by telemedicine and the availability of in person appointments. The patient expressed understanding and agreed to proceed.  Care Team   Patient Care Team: Lucille Passy, MD as PCP - General (Family Medicine)  Subjective:   HPI:   Increasing anxiety- on 03/25/19, she called stated that she was having increased anxiety and we agreed to restart low dose zoloft, 25 mg daily. She took it for 7 days.  She began to have side effects- felt fatigued and "jerky" and on edge so she stopped taking it.  That jitteriness lasted for several days.  She was fearful of taking something everyday.  She wants to discuss prn alternatives for anxiety.   She has had panic attacks and feels more anxious since the pandemic started.  Denies feeling depressed. No SI or HI. Depression screen [facility] 2/9 02/26/2019 02/20/2018 11/21/2017 01/01/2017  Decreased Interest 0 0 1 0  Down, Depressed, Hopeless 0 0 2 0  PHQ - 2 Score 0 0 3 0  Altered sleeping - - 0 -  Tired, decreased energy - - 1 -  Change in appetite - - 0 -  Feeling bad or failure about yourself  - - 0 -  Trouble concentrating - - 0 -  Moving slowly or fidgety/restless - - 0 -  Suicidal thoughts - - 0 -  PHQ-9 Score - - 4 -  Difficult doing work/chores - - Somewhat difficult -   GAD 7 : Generalized Anxiety Score 11/21/2017  Nervous, Anxious, on Edge 3  Control/stop worrying 2   Worry too much - different things 2  Trouble relaxing 1  Restless 1  Easily annoyed or irritable 3  Afraid - awful might happen 0  Total GAD 7 Score 12  Anxiety Difficulty Very difficult      Review of Systems  Psychiatric/Behavioral: Negative for hallucinations, memory loss, substance abuse and suicidal ideas. The patient is nervous/anxious. The patient does not have insomnia.   All other systems reviewed and are negative.    Patient Active Problem List   Diagnosis Date Noted  . Anxiety 04/10/2019  . GERD (gastroesophageal reflux disease) 11/21/2017  . Complicated grief 73/53/2992  . Hypokalemia 05/14/2017  . Intradural arachnoid cyst of spine 01/31/2017  . Allergic reaction 11/12/2013  . Essential hypertension 12/06/2010  . MIGRAINE, CHRONIC 11/24/2010  . Vitamin D deficiency 04/08/2010  . DEPRESSION, MILD 03/09/2010  . RENAL CYST 12/22/2009  . OBESITY 08/11/2008  . NEOPLASM, MALIGNANT, SKIN, FACE 12/01/2007  . PATENT FORAMEN OVALE 01/28/2005  . HLD (hyperlipidemia) 03/30/2001    Social History   Tobacco Use  . 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 Smokeless tobacco: Never Used  Substance Use Topics  . Alcohol use: No    Current Outpatient Medications:  .  Cholecalciferol (VITAMIN D3) 2000 units TABS, Take 2,000 Units by mouth daily., Disp: , Rfl:  .  fluticasone (FLONASE) 50 MCG/ACT nasal spray, Place 1 spray into both nostrils as needed for allergies. ,  Disp: , Rfl:  .  hydrochlorothiazide (HYDRODIURIL) 25 MG tablet, TAKE 1 TABLET (25 MG TOTAL) BY MOUTH AT BEDTIME. (Patient taking differently: TAKE 1 TABLET (25 MG TOTAL) BY MOUTH every morning), Disp: 90 tablet, Rfl: 2 .  tiZANidine (ZANAFLEX) 4 MG tablet, Take 1 tablet (4 mg total) by mouth every 6 (six) hours as needed for muscle spasms., Disp: 30 tablet, Rfl: 3 .  LORazepam (ATIVAN) 0.5 MG tablet, Take 0.5-1 tablets (0.25-0.5 mg total) by mouth 2 (two) times daily as needed for anxiety., Disp: 30 tablet, Rfl: 0   Allergies  Allergen Reactions  . Simvastatin Other (See Comments)    MYALGIAS  . Statins Other (See Comments)    Has severe leg muscle aches and cramps  . Bactrim [Sulfamethoxazole-Trimethoprim] Itching  . Sertraline Anxiety    Objective:  Wt 175 lb (79.4 kg)   BMI 29.12 kg/m   VITALS: Per patient if applicable, see vitals. GENERAL: Alert, appears well and in no acute distress. HEENT: Atraumatic, conjunctiva clear, no obvious abnormalities on inspection of external nose and ears. NECK: Normal movements of the head and neck. CARDIOPULMONARY: No increased WOB. Speaking in clear sentences. I:E ratio WNL.  MS: Moves all visible extremities without noticeable abnormality. PSYCH: Pleasant and cooperative, well-groomed. Speech normal rate and rhythm. Affect is appropriate. Insight and judgement are appropriate. Attention is focused, linear, and appropriate.  NEURO: CN grossly intact. Oriented as arrived to appointment on time with no prompting. Moves both UE equally.  SKIN: No obvious lesions, wounds, erythema, or cyanosis noted on face or hands.  Depression screen [facility] 2/9 02/26/2019 02/20/2018 11/21/2017  Decreased Interest 0 0 1  Down, Depressed, Hopeless 0 0 2  PHQ - 2 Score 0 0 3  Altered sleeping - - 0  Tired, decreased energy - - 1  Change in appetite - - 0  Feeling bad or failure about yourself  - - 0  Trouble concentrating - - 0  Moving slowly or fidgety/restless - - 0  Suicidal thoughts - - 0  PHQ-9 Score - - 4  Difficult doing work/chores - - Somewhat difficult    Assessment and Plan:   Nicole Daniels was seen today for anxiety.  Diagnoses and all orders for this visit:  Anxiety  Other orders -     LORazepam (ATIVAN) 0.5 MG tablet; Take 0.5-1 tablets (0.25-0.5 mg total) by mouth 2 (two) times daily as needed for anxiety.    Marland Kitchen COVID-19 Education: The signs and symptoms of COVID-19 were discussed with the patient and how to seek care for testing if needed. The importance  of social distancing was discussed today. . Reviewed expectations re: course of current medical issues. . Discussed self-management of symptoms. . Outlined signs and symptoms indicating need for more acute intervention. . Patient verbalized understanding and all questions were answered. Marland Kitchen Health Maintenance issues including appropriate healthy diet, exercise, and smoking avoidance were discussed with patient. . See orders for this visit as documented in the electronic medical record.  Arnette Norris, MD  Records requested if needed. Time spent: 25 minutes, of which >50% was spent in obtaining information about her symptoms, reviewing her previous labs, evaluations, and treatments, counseling her about her condition (please see the discussed topics above), and developing a plan to further investigate it; she had a number of questions which I addressed.

## 2019-04-10 NOTE — Assessment & Plan Note (Signed)
>  25 minutes spent in face to face time with patient, >50% spent in counselling or coordination of care.  Overall, she feels okay.  She has kept in touch with friends.  She feels this is situational and does not want to take any other SSRIs as she has had similar reactions in past.  We discussed sedation fall risk associated with benzos along with and appropriate use.  Agree to send in low dose lorazepam to use very sparingly- she feels she would not need to take more than once or twice a month.   erx sent for low dose ativan. She will update me in a few weeks.

## 2019-05-26 ENCOUNTER — Other Ambulatory Visit: Payer: Self-pay | Admitting: Family Medicine

## 2019-06-13 ENCOUNTER — Other Ambulatory Visit: Payer: Self-pay | Admitting: Family Medicine

## 2019-06-13 DIAGNOSIS — Z1231 Encounter for screening mammogram for malignant neoplasm of breast: Secondary | ICD-10-CM

## 2019-06-18 ENCOUNTER — Other Ambulatory Visit: Payer: Self-pay | Admitting: Family Medicine

## 2019-06-20 NOTE — Telephone Encounter (Signed)
Yasir Kitner/C Sertraline as gives unwanted side effects :)

## 2019-07-29 ENCOUNTER — Other Ambulatory Visit: Payer: Self-pay | Admitting: Neurosurgery

## 2019-07-29 DIAGNOSIS — M546 Pain in thoracic spine: Secondary | ICD-10-CM

## 2019-07-29 DIAGNOSIS — M5412 Radiculopathy, cervical region: Secondary | ICD-10-CM

## 2019-07-30 ENCOUNTER — Other Ambulatory Visit: Payer: Self-pay

## 2019-07-30 ENCOUNTER — Ambulatory Visit
Admission: RE | Admit: 2019-07-30 | Discharge: 2019-07-30 | Disposition: A | Discharge status: Home / Self Care | Payer: Medicare Other | Source: Ambulatory Visit | Attending: Family Medicine | Admitting: Family Medicine

## 2019-07-30 DIAGNOSIS — Z1231 Encounter for screening mammogram for malignant neoplasm of breast: Secondary | ICD-10-CM

## 2019-08-05 ENCOUNTER — Emergency Department (HOSPITAL_COMMUNITY)
Admission: EM | Admit: 2019-08-05 | Discharge: 2019-08-05 | Disposition: A | Discharge status: Home / Self Care | Payer: Medicare Other | Attending: Emergency Medicine | Admitting: Emergency Medicine

## 2019-08-05 ENCOUNTER — Encounter (HOSPITAL_COMMUNITY): Payer: Self-pay | Admitting: Emergency Medicine

## 2019-08-05 ENCOUNTER — Ambulatory Visit: Payer: Self-pay | Admitting: *Deleted

## 2019-08-05 ENCOUNTER — Other Ambulatory Visit: Payer: Self-pay

## 2019-08-05 DIAGNOSIS — R42 Dizziness and giddiness: Secondary | ICD-10-CM | POA: Insufficient documentation

## 2019-08-05 DIAGNOSIS — E876 Hypokalemia: Secondary | ICD-10-CM | POA: Diagnosis not present

## 2019-08-05 DIAGNOSIS — Z79899 Other long term (current) drug therapy: Secondary | ICD-10-CM | POA: Diagnosis not present

## 2019-08-05 DIAGNOSIS — Z85828 Personal history of other malignant neoplasm of skin: Secondary | ICD-10-CM | POA: Diagnosis not present

## 2019-08-05 DIAGNOSIS — I10 Essential (primary) hypertension: Secondary | ICD-10-CM | POA: Insufficient documentation

## 2019-08-05 LAB — BASIC METABOLIC PANEL
Anion gap: 13 (ref 5–15)
BUN: 14 mg/dL (ref 8–23)
CO2: 26 mmol/L (ref 22–32)
Calcium: 9.8 mg/dL (ref 8.9–10.3)
Chloride: 100 mmol/L (ref 98–111)
Creatinine, Ser: 1.03 mg/dL — ABNORMAL HIGH (ref 0.44–1.00)
GFR calc Af Amer: >60 mL/min (ref >60)
GFR calc non Af Amer: 55 mL/min — ABNORMAL LOW (ref >60)
Glucose, Bld: 138 mg/dL — ABNORMAL HIGH (ref 70–99)
Potassium: 3 mmol/L — ABNORMAL LOW (ref 3.5–5.1)
Sodium: 139 mmol/L (ref 135–145)

## 2019-08-05 LAB — CBC
HCT: 44.4 % (ref 36.0–46.0)
Hemoglobin: 14.7 g/dL (ref 12.0–15.0)
MCH: 30.7 pg (ref 26.0–34.0)
MCHC: 33.1 g/dL (ref 30.0–36.0)
MCV: 92.7 fL (ref 80.0–100.0)
Platelets: 393 10*3/uL (ref 150–400)
RBC: 4.79 MIL/uL (ref 3.87–5.11)
RDW: 12.2 % (ref 11.5–15.5)
WBC: 6.8 10*3/uL (ref 4.0–10.5)
nRBC: 0 % (ref 0.0–0.2)

## 2019-08-05 LAB — URINALYSIS, ROUTINE W REFLEX MICROSCOPIC
Bilirubin Urine: NEGATIVE
Glucose, UA: NEGATIVE mg/dL
Ketones, ur: NEGATIVE mg/dL
Nitrite: NEGATIVE
Protein, ur: NEGATIVE mg/dL
Specific Gravity, Urine: 1.008 (ref 1.005–1.030)
pH: 7 (ref 5.0–8.0)

## 2019-08-05 MED ORDER — POTASSIUM CHLORIDE CRYS ER 20 MEQ PO TBCR: 20.0000 meq | EXTENDED_RELEASE_TABLET | Freq: Every day | ORAL | 0 refills | Status: DC

## 2019-08-05 MED ORDER — POTASSIUM CHLORIDE CRYS ER 20 MEQ PO TBCR
40.0000 meq | EXTENDED_RELEASE_TABLET | Freq: Once | ORAL | Status: AC
  Administered 2019-08-05: 40 meq via ORAL
  Filled 2019-08-05: qty 2

## 2019-08-05 MED ORDER — MECLIZINE HCL 25 MG PO TABS: 12.5000 mg | ORAL_TABLET | Freq: Three times a day (TID) | ORAL | 0 refills | Status: DC | PRN

## 2019-08-05 MED ORDER — MECLIZINE HCL 25 MG PO TABS
25.0000 mg | ORAL_TABLET | Freq: Once | ORAL | Status: AC
  Administered 2019-08-05: 17:00:00 25 mg via ORAL
  Filled 2019-08-05: qty 1

## 2019-08-05 NOTE — ED Notes (Addendum)
Patient reports she is fine right now, that the dizziness is intermittent.  "comes and goes"

## 2019-08-05 NOTE — Telephone Encounter (Signed)
Pt called in c/o being dizzy/ lightheaded.   First episode was last Thur and lasted an hour.   It happened again yesterday and today she feels so lightheaded she is laying in the bed.  "I've got so much to do but I can't do much feeling this way".  I let her know she needed to go to the ED.   She can call her sister to take her.  She wanted to see Dr. Deborra Medina.    I called into the office and they agreed she should go on to the ED.  I let pt know this so she agreed to call her sister and go to [facility] ED now.  I forwarded these triage notes to Dr. Deborra Medina.  Reason for Disposition . [1] MODERATE dizziness (e.g., interferes with normal activities) AND [2] has NOT been evaluated by physician for this  (Exception: dizziness caused by heat exposure, sudden standing, or poor fluid intake)  Answer Assessment - Initial Assessment Questions 1. DESCRIPTION: "Describe your dizziness."     I'm feel lightheaded.  It started last Thur.   It lasted an hour.   Yesterday I felt it again.   This morning it's been with me for several hours the lightheaded and now I'm having nausea.   2. LIGHTHEADED: "Do you feel lightheaded?" (e.g., somewhat faint, woozy, weak upon standing)     I've been laying down now.   If I move my head right or left it feels "funny".   This feels different.   I felt like my insides were uneasy.     3. VERTIGO: "Do you feel like either you or the room is spinning or tilting?" (i.e. vertigo)     No 4. SEVERITY: "How bad is it?"  "Do you feel like you are going to faint?" "Can you stand and walk?"   - MILD - walking normally   - MODERATE - interferes with normal activities (e.g., work, school)    - SEVERE - unable to stand, requires support to walk, feels like passing out now.      Moderate    I feel unstable so I sit down. 5. ONSET:  "When did the dizziness begin?"     Last Thur. 6. AGGRAVATING FACTORS: "Does anything make it worse?" (e.g., standing, change in head position)     Walking and  laying down turning my head.   7. HEART RATE: "Can you tell me your heart rate?" "How many beats in 15 seconds?"  (Note: not all patients can do this)       No 8. CAUSE: "What do you think is causing the dizziness?"     Maybe sinus.    I had neck surgery a yr ago June.   I don't think it has anything to do with it.   I'm for an MRI to check my neck for the screws and all to see if everything is fine. 9. RECURRENT SYMPTOM: "Have you had dizziness before?" If so, ask: "When was the last time?" "What happened that time?"     No 10. OTHER SYMPTOMS: "Do you have any other symptoms?" (e.g., fever, chest pain, vomiting, diarrhea, bleeding)       Nausea.  No fever.    11. PREGNANCY: "Is there any chance you are pregnant?" "When was your last menstrual period?"       N/A due to age  Protocols used: DIZZINESS [facility]

## 2019-08-05 NOTE — ED Triage Notes (Signed)
Pt endorses dizziness since last Thursday with nasuea. Pt states she has sinus trouble and thinks it is related. Pt has neurologist and has an MRI scheduled Oct 23.

## 2019-08-05 NOTE — ED Provider Notes (Signed)
Medical screening examination/treatment/procedure(s) were conducted as a shared visit with non-physician practitioner(s) and myself.  I personally evaluated the patient during the encounter.  EKG Interpretation  Date/Time:  Tuesday August 05 2019 12:52:31 EDT Ventricular Rate:  82 PR Interval:  162 QRS Duration: 78 QT Interval:  356 QTC Calculation: 415 R Axis:   35 Text Interpretation:  Normal sinus rhythm Possible Lateral infarct , age undetermined Abnormal ECG Confirmed by Lacretia Leigh [phone]) on 08/05/2019 4:63:70 PM  68 year old female presents with dizziness consistent with peripheral vertigo.  Treated here and felt better.  Mild hypokalemia treated with oral potassium.  Return precautions given   Lacretia Leigh, MD 08/05/19 Johnnye Lana

## 2019-08-05 NOTE — ED Provider Notes (Signed)
[facility] EMERGENCY DEPARTMENT Provider Note   CSN: [id number] Arrival date & time: 08/05/19  1242     History   Chief Complaint Chief Complaint  Patient presents with  . Dizziness    HPI Nicole Daniels is a 68 y.o. female who presents the emergency department chief complaint of dizziness.  Patient states that since last Thursday she has had several episodes of vertiginous symptoms.  She states that she has had some moderate to severe episodes lasting 45 minutes to an hour of mild to moderate room spinning sensation, worse when she turns her head to the right or her eyes to the right.  Better when she stays still.  She denies ataxia and states that only one time did she have to grab something because she turned too quickly.  She denies any weakness numbness headache or difficulty with speech.  She has noticed some sinus congestion.  She has chronic tinnitus.  She had some mild nausea without vomiting.  She denies changes in vision.  She has only very mild symptoms at this time but they were a bit worse prior to arrival in the emergency department.  Patient has a history of cervical disc disease and has an MRI C-spine ordered for October 26.     The history is provided by the patient and medical records. No language interpreter was used.    Past Medical History:  Diagnosis Date  . Abdominal pain, right lower quadrant   . Acquired cyst of kidney 01/08/2012   pt denies history of   . Acute sinusitis, unspecified   . Adenomatous colon polyp   . Angiomyolipoma    kidney  . Chest pain   . Chest pain, unspecified   . Depressive disorder, not elsewhere classified 01/08/2012   not currently   . Diverticulosis   . Dizziness and giddiness   . Dysfunction of eustachian tube   . Hemorrhoids   . History of diverticulitis of colon   . IBS (irritable bowel syndrome)   . Liver lesion 2011  . Neoplasm of skin of face    malignant  . OA (osteoarthritis)    knee   . Obesity, unspecified   . Ostium secundum type atrial septal defect   . Other and unspecified hyperlipidemia   . Other malaise and fatigue   . Palpitations   . Shortness of breath   . Unspecified essential hypertension    pt states she does not have HTN  . Unspecified sinusitis (chronic)   . Unspecified vitamin D deficiency     Patient Active Problem List   Diagnosis Date Noted  . Anxiety 04/10/2019  . GERD (gastroesophageal reflux disease) 11/21/2017  . Complicated grief [id number]  . Hypokalemia 05/14/2017  . Intradural arachnoid cyst of spine 01/31/2017  . Allergic reaction 11/12/2013  . Essential hypertension 12/06/2010  . MIGRAINE, CHRONIC 11/24/2010  . Vitamin D deficiency 04/08/2010  . DEPRESSION, MILD 03/09/2010  . RENAL CYST 12/22/2009  . OBESITY 08/11/2008  . NEOPLASM, MALIGNANT, SKIN, FACE 12/01/2007  . PATENT FORAMEN OVALE 01/28/2005  . HLD (hyperlipidemia) 03/30/2001    Past Surgical History:  Procedure Laterality Date  . ABDOMINAL HYSTERECTOMY    . BREAST REDUCTION SURGERY  1990  . CERVICAL DISC SURGERY     Dr.Nudleman  . CHOLECYSTECTOMY    . COLONOSCOPY    . DILATION AND CURETTAGE OF UTERUS    . EYE SURGERY Bilateral 07/30/2017   cataract sx  . FLEXIBLE SIGMOIDOSCOPY  01/08/2012  Procedure: FLEXIBLE SIGMOIDOSCOPY;  Surgeon: Lafayette Dragon, MD;  Location: [facility];  Service: Endoscopy;  Laterality: N/A;  . HEMORRHOID SURGERY    . KNEE ARTHROTOMY     pt states no   . LAMINECTOMY N/A 01/31/2017   Procedure: Thoracic Laminectomy and marsupialization of arachnoid cyst;  Surgeon: Jovita Gamma, MD;  Location: [facility];  Service: Neurosurgery;  Laterality: N/A;  . LEFT OOPHORECTOMY  1976  . PARTIAL HYSTERECTOMY     right ovary remains  . REDUCTION MAMMAPLASTY    . TONSILLECTOMY  1972     OB History   No obstetric history on file.      Home Medications    Prior to Admission medications   Medication Sig Start Date End Date Taking? Authorizing  Provider  Cholecalciferol (VITAMIN D3) 2000 units TABS Take 2,000 Units by mouth daily.    [provider]  fluticasone (FLONASE) 50 MCG/ACT nasal spray Place 1 spray into both nostrils as needed for allergies.     [provider]  hydrochlorothiazide (HYDRODIURIL) 25 MG tablet TAKE 1 TABLET BY MOUTH EVERYDAY AT BEDTIME 05/26/19   Lucille Passy, MD  LORazepam (ATIVAN) 0.5 MG tablet Take 0.5-1 tablets (0.25-0.5 mg total) by mouth 2 (two) times daily as needed for anxiety. 04/10/19   Lucille Passy, MD  tiZANidine (ZANAFLEX) 4 MG tablet Take 1 tablet (4 mg total) by mouth every 6 (six) hours as needed for muscle spasms. 12/12/16   Leandrew Koyanagi, MD    Family History Family History  Problem Relation Age of Onset  . Cirrhosis Father   . Heart disease Father   . Hypertension Mother   . Diabetes Mother   . Breast cancer Cousin   . Hypertension Sister   . Diabetes Sister   . Diabetes Cousin   . Diabetes Other        aunt  . Stroke Other        aunt  . Heart disease Brother        x 2  . Colon cancer Neg Hx     Social History Social History   Tobacco Use  . 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 Smokeless tobacco: Never Used  Substance Use Topics  . Alcohol use: No  . Drug use: No     Allergies   Simvastatin, Statins, Bactrim [sulfamethoxazole-trimethoprim], and Sertraline   Review of Systems Review of Systems Ten systems reviewed and are negative for acute change, except as noted in the HPI.    Physical Exam Updated Vital Signs BP (!) 149/70   Pulse 73   Temp 98.2 F (36.8 C) (Oral)   Resp (!) 21   Ht 5\' 5"  (1.651 m)   Wt 81.6 kg   SpO2 96%   BMI 29.95 kg/m   Physical Exam Neurological:     Comments: Speech is clear and goal oriented, follows commands Major Cranial nerves without deficit, no facial droop Normal strength in upper and lower extremities bilaterally including dorsiflexion and plantar flexion, strong and equal grip strength Sensation  normal to light and sharp touch Moves extremities without ataxia, coordination intact Normal finger to nose and rapid alternating movements Neg romberg, no pronator drift Normal gait Normal heel-shin and balance       ED Treatments / Results  Labs (all labs ordered are listed, but only abnormal results are displayed) Labs Reviewed  BASIC METABOLIC PANEL - Abnormal; Notable for the following components:      Result Value  Potassium 3.0 (*)    Glucose, Bld 138 (*)    Creatinine, Ser 1.03 (*)    GFR calc non Af Amer 55 (*)    All other components within normal limits  URINALYSIS, ROUTINE W REFLEX MICROSCOPIC - Abnormal; Notable for the following components:   Color, Urine STRAW (*)    Hgb urine dipstick SMALL (*)    Leukocytes,Ua MODERATE (*)    Bacteria, UA RARE (*)    All other components within normal limits  CBC    EKG EKG Interpretation  Date/Time:  Tuesday August 05 2019 12:52:31 EDT Ventricular Rate:  82 PR Interval:  162 QRS Duration: 78 QT Interval:  356 QTC Calculation: 415 R Axis:   35 Text Interpretation:  Normal sinus rhythm Possible Lateral infarct , age undetermined Abnormal ECG Confirmed by Lacretia Leigh ([id number]) on 08/05/2019 4:46:03 PM   Radiology No results found.  Procedures Procedures (including critical care time)  Medications Ordered in ED Medications  meclizine (ANTIVERT) tablet 25 mg (25 mg Oral Given 08/05/19 1634)     Initial Impression / Assessment and Plan / ED Course  I have reviewed the triage vital signs and the nursing notes.  Pertinent labs & imaging results that were available during my care of the patient were reviewed by me and considered in my medical decision making (see chart for details).  Clinical Course as of Aug 04 1699  Tue Aug 05, 2019  1601 Potassium(!): 3.0 [AH]    Clinical Course User Index [AH] Margarita Mail, PA-C       [id number] VS:  Vitals:   08/05/19 1246 08/05/19 1247 08/05/19 1600 08/05/19  1816  BP: (!) 165/78  (!) 149/70 125/71  Pulse: 79  73 66  Resp: 14  (!) 21 18  Temp: 98.2 F (36.8 C)     TempSrc: Oral     SpO2: 98%  96% 100%  Weight:  81.6 kg    Height:  5\' 5"  (1.651 m)     [id number] is gathered by patient  and EMR. DDX:The emergent differential diagnosis for acute vertigo low includes peripheral causes such as BPPV, barotrauma, ear foreign body, Mnire's disease, infectious causes such as lip bronchitis, vestibular neuritis or Ramsay Hunt syndrome.  Other emergent causes are central such as cerebellar stroke, vertebrobasilar insufficiency, neoplastic causes, vertebral artery dissection, MS, neurosyphilis or tuberculosis, epilepsy or migraine.  Other causes include anemia, hyperviscosity syndrome, alcohol or aminoglycoside use, renal failure, hypoglycemia and thyroid disease. Labs: I reviewed the labs which show hypokalemia treated here with oral potassium and potassium at discharge, mildly elevated blood glucose, CBC without abnormality.  Patient urine questionable for infection however she is asymptomatic and I do not feel she needs treatment. Imaging:  EKG:.Normal sinus rhythm, regular rate MDM: Patient here with very mild vertiginous symptoms which have been intermittent for the past few days.  Her symptoms are made worse when she turns her head or eyes to the right side.  She has a completely normal neurologic exam without ataxia.  Her finger-to-nose, rapid alternating movement and heel-to-shin are normal.  I have seen the patient and shared visit with Dr. Zenia Resides who agrees that this presentation appears consistent with a diagnosis of peripheral vertigo, likely BPPV. Patient disposition: Discharge Patient condition: Excellent. The patient appears reasonably screened and/or stabilized for discharge and I doubt any other medical condition or other [facility] requiring further screening, evaluation, or treatment in the ED at this time prior to discharge. I have discussed lab  and/or imaging findings with the patient and answered all questions/concerns to the best of my ability. I have discussed return precautions and OP follow up.      Final Clinical Impressions(s) / ED Diagnoses   Final diagnoses:  None    ED Discharge Orders    None       Margarita Mail, PA-C 08/05/19 2051    Lacretia Leigh, MD 08/06/19 1652

## 2019-08-05 NOTE — Discharge Instructions (Addendum)
Get help right away if you: Have difficulty speaking or moving. Are always dizzy. Faint. Develop severe headaches. Have weakness in your legs or arms. Have changes in your hearing or vision. Develop a stiff neck. Develop sensitivity to light. 

## 2019-08-22 ENCOUNTER — Ambulatory Visit
Admission: RE | Admit: 2019-08-22 | Discharge: 2019-08-22 | Disposition: A | Discharge status: Home / Self Care | Payer: Medicare Other | Source: Ambulatory Visit | Attending: Neurosurgery | Admitting: Neurosurgery

## 2019-08-22 ENCOUNTER — Other Ambulatory Visit: Payer: Self-pay

## 2019-08-22 DIAGNOSIS — M546 Pain in thoracic spine: Secondary | ICD-10-CM

## 2019-08-22 DIAGNOSIS — M5412 Radiculopathy, cervical region: Secondary | ICD-10-CM

## 2019-08-22 MED ORDER — GADOBENATE DIMEGLUMINE 529 MG/ML IV SOLN
17.0000 mL | Freq: Once | INTRAVENOUS | Status: AC | PRN
  Administered 2019-08-22: 17 mL via INTRAVENOUS

## 2019-08-29 DIAGNOSIS — H9313 Tinnitus, bilateral: Secondary | ICD-10-CM | POA: Insufficient documentation

## 2019-09-01 LAB — TSH: TSH: 7.63 — AB (ref 0.41–5.90)

## 2019-09-05 DIAGNOSIS — M4000 Postural kyphosis, site unspecified: Secondary | ICD-10-CM | POA: Insufficient documentation

## 2019-09-10 DIAGNOSIS — H6123 Impacted cerumen, bilateral: Secondary | ICD-10-CM | POA: Insufficient documentation

## 2019-09-10 DIAGNOSIS — H9113 Presbycusis, bilateral: Secondary | ICD-10-CM | POA: Insufficient documentation

## 2019-09-11 ENCOUNTER — Other Ambulatory Visit: Payer: Medicare Other

## 2019-09-11 ENCOUNTER — Encounter: Payer: Self-pay | Admitting: Family Medicine

## 2019-09-11 ENCOUNTER — Ambulatory Visit (INDEPENDENT_AMBULATORY_CARE_PROVIDER_SITE_OTHER): Payer: Medicare Other | Admitting: Family Medicine

## 2019-09-11 VITALS — BP 127/71 | Temp 97.9°F

## 2019-09-11 DIAGNOSIS — R600 Localized edema: Secondary | ICD-10-CM

## 2019-09-11 DIAGNOSIS — R7989 Other specified abnormal findings of blood chemistry: Secondary | ICD-10-CM | POA: Insufficient documentation

## 2019-09-11 MED ORDER — LEVOTHYROXINE SODIUM 25 MCG PO TABS: 25.0000 ug | ORAL_TABLET | Freq: Every day | ORAL | 1 refills | Status: DC

## 2019-09-11 NOTE — Assessment & Plan Note (Signed)
>  25 minutes spent in face to face time with patient, >50% spent in counselling or coordination of care discussing pedal edema, hair thinning, cold feet and elevated TSH. We agreed to have her come in for TSH, FT4 and T3.  I have already sent in synthroid 25 mcg daily and explained to her to wait to pick it up until after she has come in for labs today which I have already scheduled.  Since her TSH is rising and she is now symptomatic, prudent to start low dose synthroid.  Also advised the following:  Take synthroid greater than 30 min before breakfast, separated by at least 4 hours  from antacids, calcium, iron, and multivitamins.  Wear compression stockings once they come in, if she develops any new symptoms, to let me know ASAP.  Also to cut back on salt.  Will place future orders as well for her to come in for follow up thyroid labs as well in 1-2 months.   The patient indicates understanding of these issues and agrees with the plan.  Orders Placed This Encounter  Procedures  . T4, free  . T3  . TSH

## 2019-09-11 NOTE — Progress Notes (Signed)
Virtual Visit via Video   Due to the COVID-19 pandemic, this visit was completed with telemedicine (audio/video) technology to reduce patient and provider exposure as well as to preserve personal protective equipment.   I connected with Nicole Daniels by a video enabled telemedicine application and verified that I am speaking with the correct person using two identifiers. Location patient: Home Location provider: [place] Daniels, Office Persons participating in the virtual visit: Nicole Daniels, Nicole Norris, MD   I discussed the limitations of evaluation and management by telemedicine and the availability of in person appointments. The patient expressed understanding and agreed to proceed.  Care Team   Patient Care Team: Nicole Passy, MD as PCP - General (Family Medicine)  Subjective:   HPI:   Attempted video visit without luck so called the patient.  LE edema with legs feeling cold- bilateral LE edema for past 1.5 weeks. Feels like they are cold but no color chage. She pushes her finger onto her leg and her finger print stays. Denies SOB or CP. She has been elevating her feet. It goes down at night but then starts all over again the next day.  She hasn't received the compression hose she ordered.  She is on HCTZ which has not helped with this swelling.  She was seen by ENT who ordered a TSH through [facility] and the pt's copy reads 7.630. Medical assistant has asked Nicole Daniels to see if she can get the results of this faxed to [place].  She is not on any medications for hypothyroidism.  She has noted hair thinning lately as well.  TSH was only slightly elevated last time we checked it in our office.  Lab Results  Component Value Date   TSH 5.52 (H) 02/28/2018    Review of Systems  Constitutional: Negative for chills, diaphoresis, fever, malaise/fatigue and weight loss.  HENT: Negative.   Eyes: Negative.   Respiratory: Negative.   Cardiovascular: Positive for leg swelling.  Negative for palpitations, orthopnea, claudication and PND.  Gastrointestinal: Negative.   Genitourinary: Negative.   Musculoskeletal: Negative.        Legs feel cold  Skin: Negative.        +hair thinning  Neurological: Negative.   Endo/Heme/Allergies: Negative.   Psychiatric/Behavioral: Negative.   All other systems reviewed and are negative.    Patient Active Problem List   Diagnosis Date Noted  . Bilateral leg edema 09/11/2019  . Elevated TSH 09/11/2019  . Anxiety 04/10/2019  . GERD (gastroesophageal reflux disease) 11/21/2017  . Complicated grief [id number]  . Hypokalemia 05/14/2017  . Intradural arachnoid cyst of spine 01/31/2017  . Allergic reaction 11/12/2013  . Essential hypertension 12/06/2010  . MIGRAINE, CHRONIC 11/24/2010  . Vitamin D deficiency 04/08/2010  . DEPRESSION, MILD 03/09/2010  . RENAL CYST 12/22/2009  . OBESITY 08/11/2008  . NEOPLASM, MALIGNANT, SKIN, FACE 12/01/2007  . PATENT FORAMEN OVALE 01/28/2005  . HLD (hyperlipidemia) 03/30/2001    Social History   Tobacco Use  . 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 Smokeless tobacco: Never Used  Substance Use Topics  . Alcohol use: No    Current Outpatient Medications:  .  Cholecalciferol (VITAMIN D3) 2000 units TABS, Take 2,000 Units by mouth daily., Disp: , Rfl:  .  fluticasone (FLONASE) 50 MCG/ACT nasal spray, Place 1 spray into both nostrils as needed for allergies. , Disp: , Rfl:  .  hydrochlorothiazide (HYDRODIURIL) 25 MG tablet, TAKE 1 TABLET BY MOUTH EVERYDAY AT BEDTIME, Disp: 90 tablet, Rfl:  2 .  LORazepam (ATIVAN) 0.5 MG tablet, Take 0.5-1 tablets (0.25-0.5 mg total) by mouth 2 (two) times daily as needed for anxiety., Disp: 30 tablet, Rfl: 0 .  meclizine (ANTIVERT) 25 MG tablet, Take 0.5-1 tablets (12.5-25 mg total) by mouth 3 (three) times daily as needed for dizziness., Disp: 30 tablet, Rfl: 0 .  levothyroxine (SYNTHROID) 25 MCG tablet, Take 1 tablet (25 mcg total) by mouth daily before  breakfast., Disp: 30 tablet, Rfl: 1 .  potassium chloride SA (KLOR-CON) 20 MEQ tablet, Take 1 tablet (20 mEq total) by mouth daily. (Patient not taking: Reported on 09/11/2019), Disp: 3 tablet, Rfl: 0 .  tiZANidine (ZANAFLEX) 4 MG tablet, Take 1 tablet (4 mg total) by mouth every 6 (six) hours as needed for muscle spasms. (Patient not taking: Reported on 09/11/2019), Disp: 30 tablet, Rfl: 3  Allergies  Allergen Reactions  . Simvastatin Other (See Comments)    MYALGIAS  . Statins Other (See Comments)    Has severe leg muscle aches and cramps  . Bactrim [Sulfamethoxazole-Trimethoprim] Itching  . Sertraline Anxiety    Objective:  BP 127/71   Temp 97.9 F (36.6 C) (Oral)   VITALS: Per patient if applicable, see vitals. GENERAL: Alert, appears well and in no acute distress. HEENT: Atraumatic, conjunctiva clear, no obvious abnormalities on inspection of external nose and ears. NECK: Normal movements of the head and neck. CARDIOPULMONARY: No increased WOB. Speaking in clear sentences. I:E ratio WNL.  MS: Moves all visible extremities without noticeable abnormality. PSYCH: Pleasant and cooperative, well-groomed. Speech normal rate and rhythm. Affect is appropriate. Insight and judgement are appropriate. Attention is focused, linear, and appropriate.  NEURO: CN grossly intact. Oriented as arrived to appointment on time with no prompting. Moves both UE equally.  SKIN: unable to visualize swelling or color of her legs- goes just above the ankles.  Depression screen [facility] 2/9 02/26/2019 02/20/2018 11/21/2017  Decreased Interest 0 0 1  Down, Depressed, Hopeless 0 0 2  PHQ - 2 Score 0 0 3  Altered sleeping - - 0  Tired, decreased energy - - 1  Change in appetite - - 0  Feeling bad or failure about yourself  - - 0  Trouble concentrating - - 0  Moving slowly or fidgety/restless - - 0  Suicidal thoughts - - 0  PHQ-9 Score - - 4  Difficult doing work/chores - - Somewhat difficult    Assessment  and Plan:   Nicole Daniels was seen today for leg swelling and addendum.  Diagnoses and all orders for this visit:  Bilateral leg edema -     T4, free -     T3 -     TSH  Elevated TSH -     T4, free -     T3 -     TSH  Other orders -     levothyroxine (SYNTHROID) 25 MCG tablet; Take 1 tablet (25 mcg total) by mouth daily before breakfast.    . COVID-19 Education: The signs and symptoms of COVID-19 were discussed with the patient and how to seek care for testing if needed. The importance of social distancing was discussed today. . Reviewed expectations re: course of current medical issues. . Discussed self-management of symptoms. . Outlined signs and symptoms indicating need for more acute intervention. . Patient verbalized understanding and all questions were answered. Marland Kitchen Health Maintenance issues including appropriate healthy diet, exercise, and smoking avoidance were discussed with patient. . See orders for this visit as documented  in the electronic medical record.  Nicole Norris, MD  Records requested if needed. Time spent: 25 minutes, of which >50% was spent in obtaining information about her symptoms, reviewing her previous labs, evaluations, and treatments, counseling her about her condition (please see the discussed topics above), and developing a plan to further investigate it; she had a number of questions which I addressed.

## 2019-09-11 NOTE — Patient Instructions (Addendum)
Please take your thyroid medication greater than 30 min before breakfast, separated by at least 4 hours  from antacids, calcium, iron, and multivitamins.

## 2019-09-11 NOTE — Addendum Note (Signed)
Addended by: Lynnea Ferrier on: 09/11/2019 12:38 PM   Modules accepted: Orders

## 2019-09-12 ENCOUNTER — Other Ambulatory Visit: Payer: Self-pay

## 2019-09-12 DIAGNOSIS — Z20822 Contact with and (suspected) exposure to covid-19: Secondary | ICD-10-CM

## 2019-09-15 LAB — NOVEL CORONAVIRUS, NAA: SARS-CoV-2, NAA: NOT DETECTED

## 2019-09-17 ENCOUNTER — Telehealth: Payer: Self-pay

## 2019-09-17 NOTE — Telephone Encounter (Signed)
During this illness, did/does the patient experience any of the following symptoms? Fever >100.42F []   Yes [x]   No []   Unknown Subjective fever (felt feverish) []   Yes [x]   No []   Unknown Chills []   Yes [x]   No []   Unknown Muscle aches (myalgia) []   Yes [x]   No []   Unknown Runny nose (rhinorrhea) []   Yes [x]   No []   Unknown Sore throat []   Yes [x]   No []   Unknown Cough (new onset or worsening of chronic cough) []   Yes [x]   No []   Unknown Shortness of breath (dyspnea) []   Yes []   No []   Unknown Nausea or vomiting []   Yes [x]   No []   Unknown Headache []   Yes [x]   No []   Unknown Abdominal pain  []   Yes [x]   No []   Unknown Diarrhea (?3 loose/looser than normal stools/24hr period) []   Yes [x]   No []   Unknown Other, specify:  NEGATIVE COVID TEST ON 09/15/19

## 2019-09-18 ENCOUNTER — Other Ambulatory Visit: Payer: Self-pay

## 2019-09-18 ENCOUNTER — Other Ambulatory Visit (INDEPENDENT_AMBULATORY_CARE_PROVIDER_SITE_OTHER): Payer: Medicare Other

## 2019-09-18 DIAGNOSIS — R600 Localized edema: Secondary | ICD-10-CM

## 2019-09-18 DIAGNOSIS — R7989 Other specified abnormal findings of blood chemistry: Secondary | ICD-10-CM

## 2019-09-18 LAB — TSH: TSH: 5.14 u[IU]/mL — ABNORMAL HIGH (ref 0.35–4.50)

## 2019-09-18 LAB — T4, FREE: Free T4: 0.78 ng/dL (ref 0.60–1.60)

## 2019-09-19 ENCOUNTER — Encounter: Payer: Self-pay | Admitting: Family Medicine

## 2019-09-19 LAB — T3: T3, Total: 134 ng/dL (ref 76–181)

## 2019-10-16 ENCOUNTER — Other Ambulatory Visit: Payer: Self-pay

## 2019-10-16 DIAGNOSIS — R7989 Other specified abnormal findings of blood chemistry: Secondary | ICD-10-CM

## 2019-10-17 ENCOUNTER — Other Ambulatory Visit (INDEPENDENT_AMBULATORY_CARE_PROVIDER_SITE_OTHER): Payer: Medicare Other

## 2019-10-17 DIAGNOSIS — R7989 Other specified abnormal findings of blood chemistry: Secondary | ICD-10-CM

## 2019-10-17 LAB — T3: T3, Total: 148 ng/dL (ref 76–181)

## 2019-10-17 LAB — TSH: TSH: 4.07 u[IU]/mL (ref 0.35–4.50)

## 2019-10-17 LAB — T4, FREE: Free T4: 0.8 ng/dL (ref 0.60–1.60)

## 2019-10-20 ENCOUNTER — Encounter: Payer: Self-pay | Admitting: Family Medicine

## 2019-11-13 ENCOUNTER — Other Ambulatory Visit: Payer: Self-pay

## 2019-11-13 ENCOUNTER — Encounter: Payer: Self-pay | Admitting: Family Medicine

## 2019-11-13 MED ORDER — LEVOTHYROXINE SODIUM 25 MCG PO TABS: 25.0000 ug | ORAL_TABLET | Freq: Every day | ORAL | 5 refills | Status: DC

## 2019-11-25 ENCOUNTER — Telehealth: Payer: Self-pay | Admitting: Family Medicine

## 2019-11-25 NOTE — Telephone Encounter (Signed)
Patient is calling and requesting Dr. Deborra Medina call her back regarding a personal matter. CB is [phone] and [phone].

## 2019-11-27 ENCOUNTER — Telehealth: Payer: Self-pay | Admitting: Family Medicine

## 2019-11-27 NOTE — Telephone Encounter (Signed)
Discussed with pt about other provider options here in the office/she will call and schedule/thx dmf

## 2019-11-27 NOTE — Telephone Encounter (Signed)
That is fine w me. Thanks

## 2019-11-27 NOTE — Telephone Encounter (Signed)
MC-Pt would like to schedule TOC visit to you from Dr. Stanford Breed advise/thx dmf

## 2019-11-27 NOTE — Telephone Encounter (Signed)
Patient is calling and requesting a TOC from Dr. Deborra Medina to Dr. Bryan Lemma due to DR. Deborra Medina leaving the practive. Pls advise. CB is [phone].

## 2019-11-28 NOTE — Telephone Encounter (Signed)
Plz sched with Mc/thx dmf

## 2019-12-04 NOTE — Telephone Encounter (Signed)
Pt scheduled  

## 2019-12-15 DIAGNOSIS — Z9842 Cataract extraction status, left eye: Secondary | ICD-10-CM | POA: Diagnosis not present

## 2019-12-15 DIAGNOSIS — H0288B Meibomian gland dysfunction left eye, upper and lower eyelids: Secondary | ICD-10-CM | POA: Diagnosis not present

## 2019-12-15 DIAGNOSIS — H0288A Meibomian gland dysfunction right eye, upper and lower eyelids: Secondary | ICD-10-CM | POA: Diagnosis not present

## 2019-12-15 DIAGNOSIS — Z9841 Cataract extraction status, right eye: Secondary | ICD-10-CM | POA: Diagnosis not present

## 2019-12-15 DIAGNOSIS — H52223 Regular astigmatism, bilateral: Secondary | ICD-10-CM | POA: Diagnosis not present

## 2020-02-06 DIAGNOSIS — M5442 Lumbago with sciatica, left side: Secondary | ICD-10-CM | POA: Diagnosis not present

## 2020-02-06 DIAGNOSIS — M47816 Spondylosis without myelopathy or radiculopathy, lumbar region: Secondary | ICD-10-CM | POA: Diagnosis not present

## 2020-02-06 DIAGNOSIS — M5136 Other intervertebral disc degeneration, lumbar region: Secondary | ICD-10-CM | POA: Diagnosis not present

## 2020-02-06 DIAGNOSIS — M5416 Radiculopathy, lumbar region: Secondary | ICD-10-CM | POA: Diagnosis not present

## 2020-02-12 ENCOUNTER — Other Ambulatory Visit: Payer: Self-pay | Admitting: Neurosurgery

## 2020-02-12 DIAGNOSIS — M5416 Radiculopathy, lumbar region: Secondary | ICD-10-CM

## 2020-03-02 ENCOUNTER — Other Ambulatory Visit: Payer: Self-pay

## 2020-03-02 NOTE — Progress Notes (Signed)
Subjective:   Nicole Daniels is a 69 y.o. female who presents for Medicare Annual (Subsequent) preventive examination.  Review of Systems:  Cardiac Risk Factors include: advanced age (>39men, >38 women);dyslipidemia;hypertension Home Safety/Smoke Alarms: Feels safe in home. Smoke alarms in place.  Lives alone. 1 story home w/ basement. Laundry in basement. Does well w/stairs.  Female:   Mammo- 07/30/19      Dexa scan-  07/11/18.      CCS- 2011. Pt states she did cologuard last year. She will follow up to find results.      Objective:     Vitals: BP 118/70 (BP Location: Left Arm, Patient Position: Sitting, Cuff Size: Normal) Comment: all vitals done in PCP visit directly prior.  Pulse 68   Temp 97.6 F (36.4 C) (Oral)   Ht 5\' 5"  (1.651 m)   Wt 185 lb (83.9 kg)   SpO2 98%   BMI 30.79 kg/m   Body mass index is 30.79 kg/m.  Advanced Directives 03/03/2020 02/26/2019 12/23/2018 02/20/2018 05/14/2017 02/01/2017 02/01/2017  Does Patient Have a Medical Advance Directive? Yes Yes Yes Yes Yes Yes Yes  Type of Paramedic of [place];Living will [place];Living will Living will;Healthcare Power of [place];Out of facility DNR (pink MOST or yellow form) [place];Living will Living will [place];Living will [place];Living will  Does patient want to make changes to medical advance directive? No - Patient declined No - Patient declined No - Patient declined No - Patient declined No - Patient declined Yes (Inpatient - patient requests chaplain consult to change a medical advance directive) Yes (Inpatient - patient requests chaplain consult to change a medical advance directive)  Copy of [place] in Chart? Yes - validated most recent copy scanned in chart (See row information) Yes - validated most recent copy scanned in chart (See row information) - No - copy requested - Yes No -  copy requested  Pre-existing out of facility DNR order (yellow form or pink MOST form) - - - - - - -    Tobacco Social History   Tobacco Use  Smoking Status Never Smoker  Smokeless Tobacco Never Used     Counseling given: Not Answered   Clinical Intake: Pain : No/denies pain     Past Medical History:  Diagnosis Date  . Abdominal pain, right lower quadrant   . Acquired cyst of kidney 01/08/2012   pt denies history of   . Acute sinusitis, unspecified   . Adenomatous colon polyp   . Angiomyolipoma    kidney  . Chest pain   . Chest pain, unspecified   . Depressive disorder, not elsewhere classified 01/08/2012   not currently   . Diverticulosis   . Dizziness and giddiness   . Dysfunction of eustachian tube   . Hemorrhoids   . History of diverticulitis of colon   . IBS (irritable bowel syndrome)   . Liver lesion 2011  . Neoplasm of skin of face    malignant  . OA (osteoarthritis)    knee  . Obesity, unspecified   . Ostium secundum type atrial septal defect   . Other and unspecified hyperlipidemia   . Other malaise and fatigue   . Palpitations   . Shortness of breath   . Unspecified essential hypertension    pt states she does not have HTN  . Unspecified sinusitis (chronic)   . Unspecified vitamin D deficiency  Past Surgical History:  Procedure Laterality Date  . ABDOMINAL HYSTERECTOMY    . BREAST REDUCTION SURGERY  1990  . CERVICAL DISC SURGERY     Dr.Nudleman  . CHOLECYSTECTOMY    . COLONOSCOPY    . DILATION AND CURETTAGE OF UTERUS    . EYE SURGERY Bilateral 07/30/2017   cataract sx  . FLEXIBLE SIGMOIDOSCOPY  01/08/2012   Procedure: FLEXIBLE SIGMOIDOSCOPY;  Surgeon: Lafayette Dragon, MD;  Location: [facility];  Service: Endoscopy;  Laterality: N/A;  . HEMORRHOID SURGERY    . KNEE ARTHROTOMY     pt states no   . LAMINECTOMY N/A 01/31/2017   Procedure: Thoracic Laminectomy and marsupialization of arachnoid cyst;  Surgeon: Jovita Gamma, MD;  Location:  [facility];  Service: Neurosurgery;  Laterality: N/A;  . LEFT OOPHORECTOMY  1976  . NECK SURGERY N/A 03/2018  . PARTIAL HYSTERECTOMY     right ovary remains  . REDUCTION MAMMAPLASTY    . TONSILLECTOMY  1972   Family History  Problem Relation Age of Onset  . Cirrhosis Father   . Heart disease Father   . Hypertension Mother   . Diabetes Mother   . Breast cancer Cousin   . Hypertension Sister   . Diabetes Sister   . Diabetes Cousin   . Diabetes Other        aunt  . Stroke Other        aunt  . Heart disease Brother        x 2  . Colon cancer Neg Hx    Social History   Socioeconomic History  . Marital status: Married    Spouse name: Not on file  . Number of children: 2  . Years of education: Not on file  . Highest education level: Not on file  Occupational History  . 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 Smokeless tobacco: Never Used  Substance and Sexual Activity  . Alcohol use: No  . Drug use: No  . Sexual activity: Yes  Other Topics Concern  . Not on file  Social History Narrative   Daily caffeine    Social Determinants of Health   Financial Resource Strain: Low Risk   . Difficulty of Paying Living Expenses: Not hard at all  Food Insecurity: No Food Insecurity  . Worried About Charity fundraiser in the Last Year: Never true  . Ran Out of Food in the Last Year: Never true  Transportation Needs: No Transportation Needs  . Lack of Transportation (Medical): No  . Lack of Transportation (Non-Medical): No  Physical Activity:   . Days of Exercise per Week:   . Minutes of Exercise per Session:   Stress:   . Feeling of Stress :   Social Connections:   . Frequency of Communication with Friends and Family:   . Frequency of Social Gatherings with Friends and Family:   . Attends Religious Services:   . Active Member of Clubs or Organizations:   . Attends Archivist Meetings:   Marland Kitchen Marital Status:      Outpatient Encounter Medications as of 03/03/2020  Medication Sig  . Cholecalciferol (VITAMIN D3) 2000 units TABS Take 2,000 Units by mouth daily.  . fluticasone (FLONASE) 50 MCG/ACT nasal spray Place 1 spray into both nostrils as needed for allergies.   . hydrochlorothiazide (HYDRODIURIL) 25 MG tablet TAKE 1 TABLET BY MOUTH EVERYDAY AT BEDTIME  . levothyroxine (SYNTHROID) 25 MCG  tablet Take 1 tablet (25 mcg total) by mouth daily before breakfast.  . [DISCONTINUED] hydrochlorothiazide (HYDRODIURIL) 25 MG tablet TAKE 1 TABLET BY MOUTH EVERYDAY AT BEDTIME  . [DISCONTINUED] levothyroxine (SYNTHROID) 25 MCG tablet Take 1 tablet (25 mcg total) by mouth daily before breakfast.  . [DISCONTINUED] LORazepam (ATIVAN) 0.5 MG tablet Take 0.5-1 tablets (0.25-0.5 mg total) by mouth 2 (two) times daily as needed for anxiety. (Patient not taking: Reported on 03/03/2020)  . [DISCONTINUED] meclizine (ANTIVERT) 25 MG tablet Take 0.5-1 tablets (12.5-25 mg total) by mouth 3 (three) times daily as needed for dizziness. (Patient not taking: Reported on 03/03/2020)  . [DISCONTINUED] potassium chloride SA (KLOR-CON) 20 MEQ tablet Take 1 tablet (20 mEq total) by mouth daily. (Patient not taking: Reported on 09/11/2019)  . [DISCONTINUED] tiZANidine (ZANAFLEX) 4 MG tablet Take 1 tablet (4 mg total) by mouth every 6 (six) hours as needed for muscle spasms. (Patient not taking: Reported on 09/11/2019)   No facility-administered encounter medications on file as of 03/03/2020.    Activities of Daily Living In your present state of health, do you have any difficulty performing the following activities: 03/03/2020  Hearing? N  Vision? N  Difficulty concentrating or making decisions? N  Walking or climbing stairs? N  Dressing or bathing? N  Doing errands, shopping? N  Preparing Food and eating ? N  Using the Toilet? N  In the past six months, have you accidently leaked urine? N  Do you have problems with loss of bowel control?  N  Managing your Medications? N  Managing your Finances? N  Housekeeping or managing your Housekeeping? N  Some recent data might be hidden    Patient Care Team: Ronnald Nian, DO as PCP - General (Family Medicine) Earnie Larsson, MD as Consulting Physician (Neurosurgery)    Assessment:   This is a routine wellness examination for [place]. Physical assessment deferred to PCP.  Exercise Activities and Dietary recommendations Current Exercise Habits: The patient does not participate in regular exercise at present, Exercise limited by: None identified Diet (meal preparation, eat out, water intake, caffeinated beverages, dairy products, fruits and vegetables): well balanced   Goals    . Eliminating stressors.    . Increase physical activity     Starting 01/01/2017, I will continue to exercise at least 30 min 1-2 days per week.        Fall Risk Fall Risk  03/03/2020 02/26/2019 02/20/2018 01/01/2017  Falls in the past year? 0 0 No Yes  Comment - - - pt fell off porch  Number falls in past yr: 0 - - 1  Injury with Fall? 0 - - No  Follow up Education provided;Falls prevention discussed - - -   Depression Screen PHQ 2/9 Scores 03/03/2020 02/26/2019 02/20/2018 11/21/2017  PHQ - 2 Score 0 0 0 3  PHQ- 9 Score - - - 4     Cognitive Function Ad8 score reviewed for issues:  Issues making decisions:no  Less interest in hobbies / activities:no  Repeats questions, stories (family complaining):no  Trouble using ordinary gadgets (microwave, computer, phone):no  Forgets the month or year: no  Mismanaging finances: no  Remembering appts:no  Daily problems with thinking and/or memory:no Ad8 score is=0     MMSE - Mini Mental State Exam 02/20/2018 01/01/2017  Orientation to time 5 5  Orientation to Place 5 5  Registration 3 3  Attention/ Calculation 5 0  Recall 3 3  Language- name 2 objects 2 0  Language-  repeat 1 1  Language- follow 3 step command 3 3  Language- read & follow  direction 1 0  Write a sentence 1 0  Copy design 1 0  Total score 30 20        Immunization History  Administered Date(s) Administered  . Influenza,inj,Quad PF,6+ Mos 09/01/2015  . Td 10/30/1988, 08/25/2008     Screening Tests Health Maintenance  Topic Date Due  . COVID-19 Vaccine (1) Never done  . COLONOSCOPY  01/14/2020  . TETANUS/TDAP  09/10/2020 (Originally 08/25/2018)  . INFLUENZA VACCINE  01/28/2024 (Originally 05/30/2020)  . PNA vac Low Risk Adult (1 of 2 - PCV13) 01/01/2026 (Originally 03/01/2015)  . MAMMOGRAM  07/29/2021  . DEXA SCAN  Completed  . Hepatitis C Screening  Completed      Plan:   See you next year!  Continue to eat heart healthy diet (full of fruits, vegetables, whole grains, lean protein, water--limit salt, fat, and sugar intake) and increase physical activity as tolerated.  Continue doing brain stimulating activities (puzzles, reading, adult coloring books, staying active) to keep memory sharp.     I have personally reviewed and noted the following in the patient's chart:   . Medical and social history . Use of alcohol, tobacco or illicit drugs  . Current medications and supplements . Functional ability and status . Nutritional status . Physical activity . Advanced directives . List of other physicians . Hospitalizations, surgeries, and ER visits in previous 12 months . Vitals . Screenings to include cognitive, depression, and falls . Referrals and appointments  In addition, I have reviewed and discussed with patient certain preventive protocols, quality metrics, and best practice recommendations. A written personalized care plan for preventive services as well as general preventive health recommendations were provided to patient.     Shela Nevin, [place]  03/03/2020

## 2020-03-03 ENCOUNTER — Ambulatory Visit (INDEPENDENT_AMBULATORY_CARE_PROVIDER_SITE_OTHER): Payer: Medicare PPO | Admitting: Family Medicine

## 2020-03-03 ENCOUNTER — Ambulatory Visit (INDEPENDENT_AMBULATORY_CARE_PROVIDER_SITE_OTHER): Payer: Medicare PPO | Admitting: *Deleted

## 2020-03-03 ENCOUNTER — Encounter: Payer: Self-pay | Admitting: Family Medicine

## 2020-03-03 VITALS — BP 118/70 | HR 68 | Temp 97.6°F | Ht 65.0 in | Wt 185.0 lb

## 2020-03-03 DIAGNOSIS — Z0000 Encounter for general adult medical examination without abnormal findings: Secondary | ICD-10-CM | POA: Diagnosis not present

## 2020-03-03 DIAGNOSIS — I10 Essential (primary) hypertension: Secondary | ICD-10-CM

## 2020-03-03 DIAGNOSIS — R7989 Other specified abnormal findings of blood chemistry: Secondary | ICD-10-CM

## 2020-03-03 DIAGNOSIS — E785 Hyperlipidemia, unspecified: Secondary | ICD-10-CM

## 2020-03-03 DIAGNOSIS — R7301 Impaired fasting glucose: Secondary | ICD-10-CM | POA: Diagnosis not present

## 2020-03-03 LAB — BASIC METABOLIC PANEL
BUN: 19 mg/dL (ref 6–23)
CO2: 27 mEq/L (ref 19–32)
Calcium: 9.6 mg/dL (ref 8.4–10.5)
Chloride: 105 mEq/L (ref 96–112)
Creatinine, Ser: 0.78 mg/dL (ref 0.40–1.20)
GFR: 73.01 mL/min (ref >60.00)
Glucose, Bld: 94 mg/dL (ref 70–99)
Potassium: 3.6 mEq/L (ref 3.5–5.1)
Sodium: 139 mEq/L (ref 135–145)

## 2020-03-03 LAB — TSH: TSH: 3.73 u[IU]/mL (ref 0.35–4.50)

## 2020-03-03 LAB — HEMOGLOBIN A1C: Hgb A1c MFr Bld: 5.7 % (ref 4.6–6.5)

## 2020-03-03 LAB — LIPID PANEL
Cholesterol: 239 mg/dL — ABNORMAL HIGH (ref 0–200)
HDL: 61 mg/dL (ref >39.00)
LDL Cholesterol: 146 mg/dL — ABNORMAL HIGH (ref 0–99)
NonHDL: 178.45
Total CHOL/HDL Ratio: 4
Triglycerides: 161 mg/dL — ABNORMAL HIGH (ref 0.0–149.0)
VLDL: 32.2 mg/dL (ref 0.0–40.0)

## 2020-03-03 LAB — T4, FREE: Free T4: 0.69 ng/dL (ref 0.60–1.60)

## 2020-03-03 MED ORDER — LEVOTHYROXINE SODIUM 25 MCG PO TABS: 25.0000 ug | ORAL_TABLET | Freq: Every day | ORAL | 3 refills | Status: DC

## 2020-03-03 MED ORDER — HYDROCHLOROTHIAZIDE 25 MG PO TABS: ORAL_TABLET | ORAL | 3 refills | Status: DC

## 2020-03-03 NOTE — Patient Instructions (Signed)
See you next year!  Continue to eat heart healthy diet (full of fruits, vegetables, whole grains, lean protein, water--limit salt, fat, and sugar intake) and increase physical activity as tolerated.  Continue doing brain stimulating activities (puzzles, reading, adult coloring books, staying active) to keep memory sharp.    Nicole Daniels , Thank you for taking time to come for your Medicare Wellness Visit. I appreciate your ongoing commitment to your health goals. Please review the following plan we discussed and let me know if I can assist you in the future.   These are the goals we discussed: Goals    . Eliminating stressors.    . Increase physical activity     Starting 01/01/2017, I will continue to exercise at least 30 min 1-2 days per week.        This is a list of the screening recommended for you and due dates:  Health Maintenance  Topic Date Due  . COVID-19 Vaccine (1) Never done  . Colon Cancer Screening  01/14/2020  . Tetanus Vaccine  09/10/2020*  . Flu Shot  01/28/2024*  . Pneumonia vaccines (1 of 2 - PCV13) 01/01/2026*  . Mammogram  07/29/2021  . DEXA scan (bone density measurement)  Completed  .  Hepatitis C: One time screening is recommended by Center for Disease Control  (CDC) for  adults born from [birth year] through 1966.   Completed  *Topic was postponed. The date shown is not the original due date.    Preventive Care 70 Years and Older, Female Preventive care refers to lifestyle choices and visits with your health care provider that can promote health and wellness. This includes:  A yearly physical exam. This is also called an annual well check.  Regular dental and eye exams.  Immunizations.  Screening for certain conditions.  Healthy lifestyle choices, such as diet and exercise. What can I expect for my preventive care visit? Physical exam Your health care provider will check:  Height and weight. These may be used to calculate body mass index (BMI), which  is a measurement that tells if you are at a healthy weight.  Heart rate and blood pressure.  Your skin for abnormal spots. Counseling Your health care provider may ask you questions about:  Alcohol, tobacco, and drug use.  Emotional well-being.  Home and relationship well-being.  Sexual activity.  Eating habits.  History of falls.  Memory and ability to understand (cognition).  Work and work Statistician.  Pregnancy and menstrual history. What immunizations do I need?  Influenza (flu) vaccine  This is recommended every year. Tetanus, diphtheria, and pertussis (Tdap) vaccine  You may need a Td booster every 10 years. Varicella (chickenpox) vaccine  You may need this vaccine if you have not already been vaccinated. Zoster (shingles) vaccine  You may need this after age 58. Pneumococcal conjugate (PCV13) vaccine  One dose is recommended after age 24. Pneumococcal polysaccharide (PPSV23) vaccine  One dose is recommended after age 78. Measles, mumps, and rubella (MMR) vaccine  You may need at least one dose of MMR if you were born in 1957 or later. You may also need a second dose. Meningococcal conjugate (MenACWY) vaccine  You may need this if you have certain conditions. Hepatitis A vaccine  You may need this if you have certain conditions or if you travel or work in places where you may be exposed to hepatitis A. Hepatitis B vaccine  You may need this if you have certain conditions or if you travel  or work in places where you may be exposed to hepatitis B. Haemophilus influenzae type b (Hib) vaccine  You may need this if you have certain conditions. You may receive vaccines as individual doses or as more than one vaccine together in one shot (combination vaccines). Talk with your health care provider about the risks and benefits of combination vaccines. What tests do I need? Blood tests  Lipid and cholesterol levels. These may be checked every 5 years, or  more frequently depending on your overall health.  Hepatitis C test.  Hepatitis B test. Screening  Lung cancer screening. You may have this screening every year starting at age 38 if you have a 30-pack-year history of smoking and currently smoke or have quit within the past 15 years.  Colorectal cancer screening. All adults should have this screening starting at age 91 and continuing until age 17. Your health care provider may recommend screening at age 15 if you are at increased risk. You will have tests every 1-10 years, depending on your results and the type of screening test.  Diabetes screening. This is done by checking your blood sugar (glucose) after you have not eaten for a while (fasting). You may have this done every 1-3 years.  Mammogram. This may be done every 1-2 years. Talk with your health care provider about how often you should have regular mammograms.  BRCA-related cancer screening. This may be done if you have a family history of breast, ovarian, tubal, or peritoneal cancers. Other tests  Sexually transmitted disease (STD) testing.  Bone density scan. This is done to screen for osteoporosis. You may have this done starting at age 60. Follow these instructions at home: Eating and drinking  Eat a diet that includes fresh fruits and vegetables, whole grains, lean protein, and low-fat dairy products. Limit your intake of foods with high amounts of sugar, saturated fats, and salt.  Take vitamin and mineral supplements as recommended by your health care provider.  Do not drink alcohol if your health care provider tells you not to drink.  If you drink alcohol: ? Limit how much you have to 0-1 drink a day. ? Be aware of how much alcohol is in your drink. In the [place] one drink equals one 12 oz bottle of beer (355 mL), one 5 oz glass of wine (148 mL), or one 1 oz glass of hard liquor (44 mL). Lifestyle  Take daily care of your teeth and gums.  Stay active. Exercise for  at least 30 minutes on 5 or more days each week.  Do not use any products that contain nicotine or tobacco, such as cigarettes, e-cigarettes, and chewing tobacco. If you need help quitting, ask your health care provider.  If you are sexually active, practice safe sex. Use a condom or other form of protection in order to prevent STIs (sexually transmitted infections).  Talk with your health care provider about taking a low-dose aspirin or statin. What's next?  Go to your health care provider once a year for a well check visit.  Ask your health care provider how often you should have your eyes and teeth checked.  Stay up to date on all vaccines. This information is not intended to replace advice given to you by your health care provider. Make sure you discuss any questions you have with your health care provider. Document Revised: 10/10/2018 Document Reviewed: 10/10/2018 Elsevier Patient Education  2020 [employer].

## 2020-03-03 NOTE — Progress Notes (Signed)
Nicole Daniels is a 69 y.o. female  Chief Complaint  Patient presents with  . Establish Care    Pt here for [facility]    HPI: Nicole Daniels is a 69 y.o. female who is a former pt of Dr. Deborra Medina here today for a TOC appt and medication refills. She would like to have labs done today to check her thyroid function. She is not fasting but is agreeable to having all labs done. She has no issues or concerns today.  Past Medical History:  Diagnosis Date  . Abdominal pain, right lower quadrant   . Acquired cyst of kidney 01/08/2012   pt denies history of   . Acute sinusitis, unspecified   . Adenomatous colon polyp   . Angiomyolipoma    kidney  . Chest pain   . Chest pain, unspecified   . Depressive disorder, not elsewhere classified 01/08/2012   not currently   . Diverticulosis   . Dizziness and giddiness   . Dysfunction of eustachian tube   . Hemorrhoids   . History of diverticulitis of colon   . IBS (irritable bowel syndrome)   . Liver lesion 2011  . Neoplasm of skin of face    malignant  . OA (osteoarthritis)    knee  . Obesity, unspecified   . Ostium secundum type atrial septal defect   . Other and unspecified hyperlipidemia   . Other malaise and fatigue   . Palpitations   . Shortness of breath   . Unspecified essential hypertension    pt states she does not have HTN  . Unspecified sinusitis (chronic)   . Unspecified vitamin D deficiency     Past Surgical History:  Procedure Laterality Date  . ABDOMINAL HYSTERECTOMY    . BREAST REDUCTION SURGERY  1990  . CERVICAL DISC SURGERY     Dr.Nudleman  . CHOLECYSTECTOMY    . COLONOSCOPY    . DILATION AND CURETTAGE OF UTERUS    . EYE SURGERY Bilateral 07/30/2017   cataract sx  . FLEXIBLE SIGMOIDOSCOPY  01/08/2012   Procedure: FLEXIBLE SIGMOIDOSCOPY;  Surgeon: Lafayette Dragon, MD;  Location: [facility];  Service: Endoscopy;  Laterality: N/A;  . HEMORRHOID SURGERY    . KNEE ARTHROTOMY     pt states no   .  LAMINECTOMY N/A 01/31/2017   Procedure: Thoracic Laminectomy and marsupialization of arachnoid cyst;  Surgeon: Jovita Gamma, MD;  Location: [place];  Service: Neurosurgery;  Laterality: N/A;  . LEFT OOPHORECTOMY  1976  . NECK SURGERY N/A 03/2018  . PARTIAL HYSTERECTOMY     right ovary remains  . REDUCTION MAMMAPLASTY    . TONSILLECTOMY  1972    Social History   Socioeconomic History  . Marital status: Married    Spouse name: Not on file  . Number of children: 2  . Years of education: Not on file  . Highest education level: Not on file  Occupational History  . 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 Smokeless tobacco: Never Used  Substance and Sexual Activity  . Alcohol use: No  . Drug use: No  . Sexual activity: Yes  Other Topics Concern  . Not on file  Social History Narrative   Daily caffeine    Social Determinants of Health   Financial Resource Strain:   . Difficulty of Paying Living Expenses:   Food Insecurity:   . Worried About Charity fundraiser in the  Last Year:   . [place] in the Last Year:   Transportation Needs:   . Film/video editor (Medical):   Marland Kitchen Lack of Transportation (Non-Medical):   Physical Activity:   . Days of Exercise per Week:   . Minutes of Exercise per Session:   Stress:   . Feeling of Stress :   Social Connections:   . Frequency of Communication with Friends and Family:   . Frequency of Social Gatherings with Friends and Family:   . Attends Religious Services:   . Active Member of Clubs or Organizations:   . Attends Archivist Meetings:   Marland Kitchen Marital Status:   Intimate Partner Violence:   . Fear of Current or Ex-Partner:   . Emotionally Abused:   Marland Kitchen Physically Abused:   . Sexually Abused:     Family History  Problem Relation Age of Onset  . Cirrhosis Father   . Heart disease Father   . Hypertension Mother   . Diabetes Mother   . Breast cancer Cousin   .  Hypertension Sister   . Diabetes Sister   . Diabetes Cousin   . Diabetes Other        aunt  . Stroke Other        aunt  . Heart disease Brother        x 2  . Colon cancer Neg Hx      Immunization History  Administered Date(s) Administered  . Influenza,inj,Quad PF,6+ Mos 09/01/2015  . Td 10/30/1988, 08/25/2008    Outpatient Encounter Medications as of 03/03/2020  Medication Sig  . Cholecalciferol (VITAMIN D3) 2000 units TABS Take 2,000 Units by mouth daily.  . fluticasone (FLONASE) 50 MCG/ACT nasal spray Place 1 spray into both nostrils as needed for allergies.   . hydrochlorothiazide (HYDRODIURIL) 25 MG tablet TAKE 1 TABLET BY MOUTH EVERYDAY AT BEDTIME  . levothyroxine (SYNTHROID) 25 MCG tablet Take 1 tablet (25 mcg total) by mouth daily before breakfast.  . [DISCONTINUED] LORazepam (ATIVAN) 0.5 MG tablet Take 0.5-1 tablets (0.25-0.5 mg total) by mouth 2 (two) times daily as needed for anxiety. (Patient not taking: Reported on 03/03/2020)  . [DISCONTINUED] meclizine (ANTIVERT) 25 MG tablet Take 0.5-1 tablets (12.5-25 mg total) by mouth 3 (three) times daily as needed for dizziness. (Patient not taking: Reported on 03/03/2020)  . [DISCONTINUED] potassium chloride SA (KLOR-CON) 20 MEQ tablet Take 1 tablet (20 mEq total) by mouth daily. (Patient not taking: Reported on 09/11/2019)  . [DISCONTINUED] tiZANidine (ZANAFLEX) 4 MG tablet Take 1 tablet (4 mg total) by mouth every 6 (six) hours as needed for muscle spasms. (Patient not taking: Reported on 09/11/2019)   No facility-administered encounter medications on file as of 03/03/2020.     ROS: Pertinent positives and negatives noted in HPI. Remainder of ROS non-contributory    Allergies  Allergen Reactions  . Simvastatin Other (See Comments)    MYALGIAS  . Statins Other (See Comments)    Has severe leg muscle aches and cramps  . Bactrim [Sulfamethoxazole-Trimethoprim] Itching  . Sertraline Anxiety    BP 118/70 (BP Location: Left  Arm, Patient Position: Sitting, Cuff Size: Normal)   Pulse 68   Temp 97.6 F (36.4 C) (Temporal)   Ht 5\' 5"  (1.651 m)   Wt 185 lb (83.9 kg)   SpO2 98%   BMI 30.79 kg/m   Physical Exam  Constitutional: She appears well-developed and well-nourished. No distress.  Neck: No thyromegaly present.  Cardiovascular: Normal rate and  regular rhythm.  Pulmonary/Chest: Effort normal and breath sounds normal. No respiratory distress.  Musculoskeletal:        General: No edema.     Cervical back: Neck supple.  Lymphadenopathy:    She has no cervical adenopathy.  Psychiatric: She has a normal mood and affect. Her behavior is normal.     A/P:  1. Essential hypertension - controlled, at goal Refill: - hydrochlorothiazide (HYDRODIURIL) 25 MG tablet; TAKE 1 TABLET BY MOUTH EVERYDAY AT BEDTIME  Dispense: 90 tablet; Refill: 3 - Basic metabolic panel  2. Elevated TSH Refill: - levothyroxine (SYNTHROID) 25 MCG tablet; Take 1 tablet (25 mcg total) by mouth daily before breakfast.  Dispense: 90 tablet; Refill: 3 - TSH - T4, free  3. Elevated fasting blood sugar - Hemoglobin A1c  4. Hyperlipidemia, unspecified hyperlipidemia type - not able to tolerate statins - Lipid panel   This visit occurred during the SARS-CoV-2 public health emergency.  Safety protocols were in place, including screening questions prior to the visit, additional usage of staff PPE, and extensive cleaning of exam room while observing appropriate contact time as indicated for disinfecting solutions.

## 2020-03-09 ENCOUNTER — Other Ambulatory Visit: Payer: Medicare Other

## 2020-03-11 ENCOUNTER — Encounter: Payer: Self-pay | Admitting: Family Medicine

## 2020-03-29 ENCOUNTER — Encounter: Payer: Self-pay | Admitting: Family Medicine

## 2020-05-20 ENCOUNTER — Other Ambulatory Visit: Payer: Self-pay

## 2020-05-20 ENCOUNTER — Telehealth: Payer: Self-pay | Admitting: Family Medicine

## 2020-05-20 ENCOUNTER — Emergency Department (HOSPITAL_COMMUNITY)
Admission: EM | Admit: 2020-05-20 | Discharge: 2020-05-20 | Disposition: A | Discharge status: Home / Self Care | Payer: Medicare PPO | Attending: Emergency Medicine | Admitting: Emergency Medicine

## 2020-05-20 ENCOUNTER — Emergency Department (HOSPITAL_COMMUNITY): Payer: Medicare PPO

## 2020-05-20 ENCOUNTER — Encounter (HOSPITAL_COMMUNITY): Payer: Self-pay

## 2020-05-20 DIAGNOSIS — R5383 Other fatigue: Secondary | ICD-10-CM

## 2020-05-20 DIAGNOSIS — R0602 Shortness of breath: Secondary | ICD-10-CM | POA: Diagnosis not present

## 2020-05-20 DIAGNOSIS — Z79899 Other long term (current) drug therapy: Secondary | ICD-10-CM | POA: Diagnosis not present

## 2020-05-20 DIAGNOSIS — Z20822 Contact with and (suspected) exposure to covid-19: Secondary | ICD-10-CM | POA: Insufficient documentation

## 2020-05-20 DIAGNOSIS — R079 Chest pain, unspecified: Secondary | ICD-10-CM | POA: Diagnosis not present

## 2020-05-20 DIAGNOSIS — C44309 Unspecified malignant neoplasm of skin of other parts of face: Secondary | ICD-10-CM | POA: Diagnosis not present

## 2020-05-20 DIAGNOSIS — Z803 Family history of malignant neoplasm of breast: Secondary | ICD-10-CM | POA: Diagnosis not present

## 2020-05-20 DIAGNOSIS — R0789 Other chest pain: Secondary | ICD-10-CM | POA: Diagnosis not present

## 2020-05-20 LAB — BASIC METABOLIC PANEL
Anion gap: 10 (ref 5–15)
BUN: 10 mg/dL (ref 8–23)
CO2: 27 mmol/L (ref 22–32)
Calcium: 9.5 mg/dL (ref 8.9–10.3)
Chloride: 105 mmol/L (ref 98–111)
Creatinine, Ser: 0.77 mg/dL (ref 0.44–1.00)
GFR calc Af Amer: >60 mL/min (ref >60)
GFR calc non Af Amer: >60 mL/min (ref >60)
Glucose, Bld: 102 mg/dL — ABNORMAL HIGH (ref 70–99)
Potassium: 3.4 mmol/L — ABNORMAL LOW (ref 3.5–5.1)
Sodium: 142 mmol/L (ref 135–145)

## 2020-05-20 LAB — CBC
HCT: 45.8 % (ref 36.0–46.0)
Hemoglobin: 14.8 g/dL (ref 12.0–15.0)
MCH: 30.3 pg (ref 26.0–34.0)
MCHC: 32.3 g/dL (ref 30.0–36.0)
MCV: 93.9 fL (ref 80.0–100.0)
Platelets: 404 10*3/uL — ABNORMAL HIGH (ref 150–400)
RBC: 4.88 MIL/uL (ref 3.87–5.11)
RDW: 12.4 % (ref 11.5–15.5)
WBC: 6.7 10*3/uL (ref 4.0–10.5)
nRBC: 0 % (ref 0.0–0.2)

## 2020-05-20 LAB — SARS CORONAVIRUS 2 BY RT PCR (HOSPITAL ORDER, PERFORMED IN ~~LOC~~ HOSPITAL LAB): SARS Coronavirus 2: NEGATIVE

## 2020-05-20 LAB — TROPONIN I (HIGH SENSITIVITY)
Troponin I (High Sensitivity): 5 ng/L (ref <18)
Troponin I (High Sensitivity): 10 ng/L (ref <18)

## 2020-05-20 MED ORDER — SODIUM CHLORIDE 0.9% FLUSH: 3.0000 mL | Freq: Once | INTRAVENOUS | Status: DC

## 2020-05-20 MED ORDER — PANTOPRAZOLE SODIUM 20 MG PO TBEC
20.0000 mg | DELAYED_RELEASE_TABLET | Freq: Two times a day (BID) | ORAL | 0 refills | Status: DC
Start: 2020-05-20 — End: 2020-07-01

## 2020-05-20 NOTE — Discharge Instructions (Addendum)
Follow-up with your primary care doctor and the cardiologist for further evaluation.  Return as needed for worsening symptoms.  Start taking the antacid medications as prescribed.

## 2020-05-20 NOTE — ED Provider Notes (Addendum)
[facility] EMERGENCY DEPARTMENT Provider Note   CSN: [id number] Arrival date & time: 05/20/20  1235     History Chief Complaint  Patient presents with  . Chest Pain  . Weakness    Nicole Daniels is a 69 y.o. female.  HPI   Patient presents to the ED for evaluation of chest discomfort and fatigue.  Patient states her symptoms have been going on for at least the past week.  She has been feeling overall very fatigued.  She thought it most likely had to do with her thyroid.  She called her doctor today however and was instructed to come to the ED after mentioning that she had been having some issues with chest discomfort.  Patient feels like she has a pressure in the center of her chest.  It has been lasting all day long.  She has been burping and belching more than usual.  She is had some nausea but denied any diaphoresis.  Symptoms are not always brought on by activity, but occasionally are.  Patient does not have any history of heart disease.  She does have hypercholesterolemia. Pt has not been vaccinated for covid. Past Medical History:  Diagnosis Date  . Abdominal pain, right lower quadrant   . Acquired cyst of kidney 01/08/2012   pt denies history of   . Acute sinusitis, unspecified   . Adenomatous colon polyp   . Angiomyolipoma    kidney  . Chest pain   . Chest pain, unspecified   . Depressive disorder, not elsewhere classified 01/08/2012   not currently   . Diverticulosis   . Dizziness and giddiness   . Dysfunction of eustachian tube   . Hemorrhoids   . History of diverticulitis of colon   . IBS (irritable bowel syndrome)   . Liver lesion 2011  . Neoplasm of skin of face    malignant  . OA (osteoarthritis)    knee  . Obesity, unspecified   . Ostium secundum type atrial septal defect   . Other and unspecified hyperlipidemia   . Other malaise and fatigue   . Palpitations   . Shortness of breath   . Unspecified essential hypertension    pt  states she does not have HTN  . Unspecified sinusitis (chronic)   . Unspecified vitamin D deficiency     Patient Active Problem List   Diagnosis Date Noted  . Bilateral leg edema 09/11/2019  . Elevated TSH 09/11/2019  . Anxiety 04/10/2019  . GERD (gastroesophageal reflux disease) 11/21/2017  . Complicated grief 94/49/6759  . Hypokalemia 05/14/2017  . Intradural arachnoid cyst of spine 01/31/2017  . Allergic reaction 11/12/2013  . Essential hypertension 12/06/2010  . MIGRAINE, CHRONIC 11/24/2010  . Vitamin D deficiency 04/08/2010  . DEPRESSION, MILD 03/09/2010  . RENAL CYST 12/22/2009  . OBESITY 08/11/2008  . NEOPLASM, MALIGNANT, SKIN, FACE 12/01/2007  . PATENT FORAMEN OVALE 01/28/2005  . HLD (hyperlipidemia) 03/30/2001    Past Surgical History:  Procedure Laterality Date  . ABDOMINAL HYSTERECTOMY    . BREAST REDUCTION SURGERY  1990  . CERVICAL DISC SURGERY     Dr.Nudleman  . CHOLECYSTECTOMY    . COLONOSCOPY    . DILATION AND CURETTAGE OF UTERUS    . EYE SURGERY Bilateral 07/30/2017   cataract sx  . FLEXIBLE SIGMOIDOSCOPY  01/08/2012   Procedure: FLEXIBLE SIGMOIDOSCOPY;  Surgeon: Lafayette Dragon, MD;  Location: [facility];  Service: Endoscopy;  Laterality: N/A;  . HEMORRHOID SURGERY    .  KNEE ARTHROTOMY     pt states no   . LAMINECTOMY N/A 01/31/2017   Procedure: Thoracic Laminectomy and marsupialization of arachnoid cyst;  Surgeon: Jovita Gamma, MD;  Location: [place];  Service: Neurosurgery;  Laterality: N/A;  . LEFT OOPHORECTOMY  1976  . NECK SURGERY N/A 03/2018  . PARTIAL HYSTERECTOMY     right ovary remains  . REDUCTION MAMMAPLASTY    . TONSILLECTOMY  1972     OB History   No obstetric history on file.     Family History  Problem Relation Age of Onset  . Cirrhosis Father   . Heart disease Father   . Hypertension Mother   . Diabetes Mother   . Breast cancer Cousin   . Hypertension Sister   . Diabetes Sister   . Diabetes Cousin   . Diabetes Other         aunt  . Stroke Other        aunt  . Heart disease Brother        x 2  . Colon cancer Neg Hx     Social History   Tobacco Use  . 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 Smokeless tobacco: Never Used  Vaping Use  . Vaping Use: Never used  Substance Use Topics  . Alcohol use: No  . Drug use: No    Home Medications Prior to Admission medications   Medication Sig Start Date End Date Taking? Authorizing Provider  Cholecalciferol (VITAMIN D3) 2000 units TABS Take 2,000 Units by mouth daily.    [provider]  fluticasone (FLONASE) 50 MCG/ACT nasal spray Place 1 spray into both nostrils as needed for allergies.     [provider]  hydrochlorothiazide (HYDRODIURIL) 25 MG tablet TAKE 1 TABLET BY MOUTH EVERYDAY AT BEDTIME 03/03/20   Cirigliano, Garvin Fila, DO  levothyroxine (SYNTHROID) 25 MCG tablet Take 1 tablet (25 mcg total) by mouth daily before breakfast. 03/03/20   Cirigliano, Mary K, DO  pantoprazole (PROTONIX) 20 MG tablet Take 1 tablet (20 mg total) by mouth 2 (two) times daily. 05/20/20   Dorie Rank, MD    Allergies    Simvastatin, Statins, Bactrim [sulfamethoxazole-trimethoprim], and Sertraline  Review of Systems   Review of Systems  Physical Exam Updated Vital Signs BP (!) 143/60   Pulse 73   Temp 98.2 F (36.8 C) (Oral)   Resp 18   Wt 81.6 kg   SpO2 95%   BMI 29.95 kg/m   Physical Exam  ED Results / Procedures / Treatments   Labs (all labs ordered are listed, but only abnormal results are displayed) Labs Reviewed  BASIC METABOLIC PANEL - Abnormal; Notable for the following components:      Result Value   Potassium 3.4 (*)    Glucose, Bld 102 (*)    All other components within normal limits  CBC - Abnormal; Notable for the following components:   Platelets 404 (*)    All other components within normal limits  SARS CORONAVIRUS 2 BY RT PCR (HOSPITAL ORDER, [facility])  TROPONIN I (HIGH SENSITIVITY)  TROPONIN I (HIGH  SENSITIVITY)    EKG EKG Interpretation  Date/Time:  Thursday May 20 2020 12:54:34 EDT Ventricular Rate:  82 PR Interval:  166 QRS Duration: 78 QT Interval:  366 QTC Calculation: 427 R Axis:   20 Text Interpretation: Normal sinus rhythm Cannot rule out Anterior infarct , age undetermined Abnormal ECG Confirmed by Lennice Sites [phone]) on 05/20/2020 5:07:25  PM   Radiology DG Chest 2 View  Result Date: 05/20/2020 CLINICAL DATA:  Chest pain for 2 weeks, shortness of breath with exertion EXAM: CHEST - 2 VIEW COMPARISON:  05/14/2017 FINDINGS: The heart size and mediastinal contours are within normal limits. Rounded 1.4 cm nodular density projects over the peripheral aspect of the left lung base, favored to represent a nipple shadow. The lungs appear otherwise clear. No pleural effusion or pneumothorax. Lower cervical ACDF. The visualized skeletal structures are otherwise unremarkable. IMPRESSION: 1. Rounded 1.4 cm nodular density projects over the peripheral aspect of the left lung base, favored to represent a nipple shadow. Repeat chest radiograph with nipple markers could be performed to confirm. 2. Otherwise, no acute cardiopulmonary findings. Electronically Signed   By: Davina Poke D.O.   On: 05/20/2020 13:25    Procedures Procedures (including critical care time)  Medications Ordered in ED Medications  sodium chloride flush (NS) 0.9 % injection 3 mL (has no administration in time range)    ED Course  I have reviewed the triage vital signs and the nursing notes.  Pertinent labs & imaging results that were available during my care of the patient were reviewed by me and considered in my medical decision making (see chart for details).  Clinical Course as of May 20 1909  Thu May 20, 2020  1905 Patient's ED work-up is reassuring.  CBC and metabolic panel are normal.   [JK]  1906 No acute EKG changes.   [JK]  1906 There is a delta troponin of 5 but troponin is in the normal  range   [JK]    Clinical Course User Index [JK] Dorie Rank, MD   MDM Rules/Calculators/A&P                          Patient presented to the ED for evaluation of chest discomfort and fatigue that have been ongoing for the past week.  Patient states that has been lasting all day long.  She has mentioned some burping and belching.  Patient does have hypercholesterolemia but no other significant cardiac risk factors.  Patient's ED work-up is reassuring.  Her delta troponin is 5 however her troponin levels are normal.  Symptoms have been going on for a week I doubt this is related to acute coronary syndrome.  Patient however would benefit from further evaluation.  Recommend outpatient cardiac evaluation.  She has mentioned some burping and belching and will try on a course of PPIs.  Patient has not been vaccinated for Covid.  I will send off a Covid test. Final Clinical Impression(s) / ED Diagnoses Final diagnoses:  Fatigue, unspecified type  Chest pain, unspecified type    Rx / DC Orders ED Discharge Orders         Ordered    pantoprazole (PROTONIX) 20 MG tablet  2 times daily     Discontinue  Reprint     05/20/20 1909           Dorie Rank, MD 05/20/20 1910  Case discussed with cardiology Dr Terri Skains.  Will arrange for close outpt follow up   Dorie Rank, MD 05/20/20 [phone]

## 2020-05-20 NOTE — Telephone Encounter (Signed)
Patient stated she has been tired for 2-3 weeks with some tightness in chest, pain in jawbone, short of breath. I transferred patient to the triage nurse and recommended she follow their advice for when to be seen.

## 2020-05-20 NOTE — ED Triage Notes (Signed)
Pt presents to ED from home with complaints of generalized weakness, CP, left arm pain, right jaw pain x 1 week.Endorses intermittent SOB worse with exertion

## 2020-06-16 ENCOUNTER — Other Ambulatory Visit: Payer: Self-pay | Admitting: Family Medicine

## 2020-06-16 DIAGNOSIS — Z1231 Encounter for screening mammogram for malignant neoplasm of breast: Secondary | ICD-10-CM

## 2020-07-01 ENCOUNTER — Encounter: Payer: Self-pay | Admitting: Cardiology

## 2020-07-01 ENCOUNTER — Other Ambulatory Visit: Payer: Self-pay

## 2020-07-01 ENCOUNTER — Ambulatory Visit: Payer: Medicare PPO | Admitting: Cardiology

## 2020-07-01 VITALS — BP 140/80 | HR 64 | Ht 65.0 in | Wt 186.6 lb

## 2020-07-01 DIAGNOSIS — R0989 Other specified symptoms and signs involving the circulatory and respiratory systems: Secondary | ICD-10-CM

## 2020-07-01 DIAGNOSIS — Z9189 Other specified personal risk factors, not elsewhere classified: Secondary | ICD-10-CM

## 2020-07-01 DIAGNOSIS — E669 Obesity, unspecified: Secondary | ICD-10-CM

## 2020-07-01 DIAGNOSIS — E782 Mixed hyperlipidemia: Secondary | ICD-10-CM

## 2020-07-01 DIAGNOSIS — Z01812 Encounter for preprocedural laboratory examination: Secondary | ICD-10-CM

## 2020-07-01 DIAGNOSIS — Z7189 Other specified counseling: Secondary | ICD-10-CM | POA: Diagnosis not present

## 2020-07-01 DIAGNOSIS — R072 Precordial pain: Secondary | ICD-10-CM | POA: Diagnosis not present

## 2020-07-01 DIAGNOSIS — I10 Essential (primary) hypertension: Secondary | ICD-10-CM | POA: Diagnosis not present

## 2020-07-01 DIAGNOSIS — E66811 Obesity, class 1: Secondary | ICD-10-CM

## 2020-07-01 LAB — BASIC METABOLIC PANEL
BUN/Creatinine Ratio: 14 (ref 12–28)
BUN: 11 mg/dL (ref 8–27)
CO2: 26 mmol/L (ref 20–29)
Calcium: 10 mg/dL (ref 8.7–10.3)
Chloride: 100 mmol/L (ref 96–106)
Creatinine, Ser: 0.78 mg/dL (ref 0.57–1.00)
GFR calc Af Amer: 89 mL/min/{1.73_m2} (ref >59)
GFR calc non Af Amer: 77 mL/min/{1.73_m2} (ref >59)
Glucose: 98 mg/dL (ref 65–99)
Potassium: 3.9 mmol/L (ref 3.5–5.2)
Sodium: 142 mmol/L (ref 134–144)

## 2020-07-01 MED ORDER — METOPROLOL TARTRATE 50 MG PO TABS
ORAL_TABLET | ORAL | 0 refills | Status: DC
Start: 2020-07-01 — End: 2020-08-28

## 2020-07-01 MED ORDER — HYDROCHLOROTHIAZIDE 25 MG PO TABS: ORAL_TABLET | ORAL | 3 refills | Status: DC

## 2020-07-01 NOTE — Patient Instructions (Addendum)
Medication Instructions:  Your Physician recommend you continue on your current medication as directed.    *If you need a refill on your cardiac medications before your next appointment, please call your pharmacy*   Lab Work: Your physician recommends that you return for lab work today ( BMP)  If you have labs (blood work) drawn today and your tests are completely normal, you will receive your results only by: Marland Kitchen MyChart Message (if you have MyChart) OR . A paper copy in the mail If you have any lab test that is abnormal or we need to change your treatment, we will call you to review the results.   Testing/Procedures: Cardiac CT Angiography (CTA), is a special type of CT scan that uses a computer to produce multi-dimensional views of major blood vessels throughout the body. In CT angiography, a contrast material is injected through an IV to help visualize the blood vessels [facility]  Follow-Up: At [facility], you and your health needs are our priority.  As part of our continuing mission to provide you with exceptional heart care, we have created designated Provider Care Teams.  These Care Teams include your primary Cardiologist (physician) and Advanced Practice Providers (APPs -  Physician Assistants and Nurse Practitioners) who all work together to provide you with the care you need, when you need it.  We recommend signing up for the patient portal called "MyChart".  Sign up information is provided on this After Visit Summary.  MyChart is used to connect with patients for Virtual Visits (Telemedicine).  Patients are able to view lab/test results, encounter notes, upcoming appointments, etc.  Non-urgent messages can be sent to your provider as well.   To learn more about what you can do with MyChart, go to [email or web address].    Your next appointment:   3 month(s)  The format for your next appointment:   In Person  Provider:   Buford Dresser, MD  Your  cardiac CT will be scheduled at one of the below locations:   [facility] [address] [place], [place] [address] [phone]  If scheduled at [facility], please arrive at the [facility] main entrance of [facility] 30 minutes prior to test start time. Proceed to the [facility] Radiology Department (first floor) to check-in and test prep.  If scheduled at [facility], please arrive 15 mins early for check-in and test prep.  Please follow these instructions carefully (unless otherwise directed):   On the Night Before the Test: . Be sure to Drink plenty of water. . Do not consume any caffeinated/decaffeinated beverages or chocolate 12 hours prior to your test. . Do not take any antihistamines 12 hours prior to your test.   On the Day of the Test: . Drink plenty of water. Do not drink any water within one hour of the test. . Do not eat any food 4 hours prior to the test. . You may take your regular medications prior to the test.  . Take metoprolol (Lopressor) 50 mg two hours prior to test. . HOLD Hydrochlorothiazide morning of the test. . FEMALES- please wear underwire-free bra if available       After the Test: . Drink plenty of water. . After receiving IV contrast, you may experience a mild flushed feeling. This is normal. . On occasion, you may experience a mild rash up to 24 hours after the test. This is not dangerous. If this occurs, you can take Benadryl  25 mg and increase your fluid intake. . If you experience trouble breathing, this can be serious. If it is severe call 911 IMMEDIATELY. If it is mild, please call our office. . If you take any of these medications: Glipizide/Metformin, Avandament, Glucavance, please do not take 48 hours after completing test unless otherwise instructed.   Once we have confirmed authorization from your insurance company, we will call you to set up a date and time for your test. Based  on how quickly your insurance processes prior authorizations requests, please allow up to 4 weeks to be contacted for scheduling your Cardiac CT appointment. Be advised that routine Cardiac CT appointments could be scheduled as many as 8 weeks after your provider has ordered it.  For non-scheduling related questions, please contact the cardiac imaging nurse navigator should you have any questions/concerns: Marchia Bond, Cardiac Imaging Nurse Navigator Burley Saver, Interim Cardiac Imaging Nurse [place] [facility] Direct Office Dial: [phone]   For scheduling needs, including cancellations and rescheduling, please call Vivien Rota at [phone], option 3.

## 2020-07-01 NOTE — Progress Notes (Signed)
Cardiology Office Note:    Date:  07/01/2020   ID:  [name], DOB Feb 25, 1951, MRN [id number]  PCP:  Ronnald Nian, DO  Cardiologist:  Buford Dresser, MD  Referring MD: Ronnald Nian, DO   CC: new patient evaluation for chest pain  History of Present Illness:    Nicole Daniels is a 69 y.o. female with a hx of hypertension, hypothyroidism, hyperlipidemia, reported statin intolerance who is seen as a new consult at the request of Ronnald Nian, DO for the evaluation and management of chest pain.  Note from Dr. Bryan Lemma dated 03/03/20 reviewed. Also reviewed ER note from 05/20/20, when she presented for chest pain and fatigue. ECG, hsTnI unremarkable. She was trialed on PPI due to symptoms of burping/belching.  Today: Main concern is to make sure her heart is ok.  Chest pain: no longer having chest pain since her ER visit. -Initial onset: had for about a week prior to presenting to the ER. -Quality: tightness in the chest -Frequency/duration: was nearly constant the week she had the discomfort -Associated symptoms: -Aggravating/alleviating factors: better with the anti-acid she took from the ER, no longer taking. Better with belching, getting up and walking -Prior cardiac history: reported that her neurologist thought she had a small hole in her heart -Prior ECG: NSR -Prior workup: remote stress test >20 years, reported as normal -Prior treatment: PPI -Alcohol: none -Tobacco: never -Comorbidities: hypertension, high cholesterol, hypothyroidism. Was tried on simvastatin 40 mg, pravastatin 20 mg, atorvastatin 34m. Also tried ezetimibe 10 mg in 2014. All gave her myalgias. -Exercise level: no intentional exercise. Limited by knee pain, told by her doctor not to walk too much on it. Can do stairs if needed. -Cardiac ROS: no shortness of breath at rest, mild dyspnea on exertion, no PND, no orthopnea, no LE edema, no syncope -Family history: brother  RLouie Casahad to have pacemaker put in. Father died age 2552of liver cirrhosis, no known heart issues. Mother didn't have heart issues that she knows of.  Past Medical History:  Diagnosis Date  . Abdominal pain, right lower quadrant   . Acquired cyst of kidney 01/08/2012   pt denies history of   . Acute sinusitis, unspecified   . Adenomatous colon polyp   . Angiomyolipoma    kidney  . Chest pain   . Chest pain, unspecified   . Depressive disorder, not elsewhere classified 01/08/2012   not currently   . Diverticulosis   . Dizziness and giddiness   . Dysfunction of eustachian tube   . Hemorrhoids   . History of diverticulitis of colon   . IBS (irritable bowel syndrome)   . Liver lesion 2011  . Neoplasm of skin of face    malignant  . OA (osteoarthritis)    knee  . Obesity, unspecified   . Ostium secundum type atrial septal defect   . Other and unspecified hyperlipidemia   . Other malaise and fatigue   . Palpitations   . Shortness of breath   . Unspecified essential hypertension    pt states she does not have HTN  . Unspecified sinusitis (chronic)   . Unspecified vitamin D deficiency     Past Surgical History:  Procedure Laterality Date  . ABDOMINAL HYSTERECTOMY    . BREAST REDUCTION SURGERY  1990  . CERVICAL DISC SURGERY     Dr.Nudleman  . CHOLECYSTECTOMY    . COLONOSCOPY    . DILATION AND CURETTAGE OF UTERUS    .  EYE SURGERY Bilateral 07/30/2017   cataract sx  . FLEXIBLE SIGMOIDOSCOPY  01/08/2012   Procedure: FLEXIBLE SIGMOIDOSCOPY;  Surgeon: Lafayette Dragon, MD;  Location: [facility];  Service: Endoscopy;  Laterality: N/A;  . HEMORRHOID SURGERY    . KNEE ARTHROTOMY     pt states no   . LAMINECTOMY N/A 01/31/2017   Procedure: Thoracic Laminectomy and marsupialization of arachnoid cyst;  Surgeon: Jovita Gamma, MD;  Location: [facility];  Service: Neurosurgery;  Laterality: N/A;  . LEFT OOPHORECTOMY  1976  . NECK SURGERY N/A 03/2018  . PARTIAL HYSTERECTOMY     right  ovary remains  . REDUCTION MAMMAPLASTY    . TONSILLECTOMY  1972    Current Medications: Current Outpatient Medications on File Prior to Visit  Medication Sig  . Cholecalciferol (VITAMIN D3) 2000 units TABS Take 2,000 Units by mouth daily.  . fluticasone (FLONASE) 50 MCG/ACT nasal spray Place 1 spray into both nostrils as needed for allergies.   . hydrochlorothiazide (HYDRODIURIL) 25 MG tablet TAKE 1 TABLET BY MOUTH EVERYDAY AT BEDTIME  . levothyroxine (SYNTHROID) 25 MCG tablet Take 1 tablet (25 mcg total) by mouth daily before breakfast.   No current facility-administered medications on file prior to visit.     Allergies:   Simvastatin, Statins, Bactrim [sulfamethoxazole-trimethoprim], and Sertraline   Social History   Tobacco Use  . 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 Smokeless tobacco: Never Used  Vaping Use  . Vaping Use: Never used  Substance Use Topics  . Alcohol use: No  . Drug use: No    Family History: family history includes Breast cancer in her cousin; Cirrhosis in her father; Diabetes in her cousin, mother, sister, and another family member; Heart disease in her brother and father; Hypertension in her mother and sister; Stroke in an other family member. There is no history of Colon cancer.  brother Louie Casa had to have pacemaker put in. Father died age 34 of liver cirrhosis, no known heart issues. Mother didn't have heart issues that she knows of.  ROS:   Please see the history of present illness.  Additional pertinent ROS: Constitutional: Negative for chills, fever, night sweats, unintentional weight loss  HENT: Negative for ear pain and hearing loss.   Eyes: Negative for loss of vision and eye pain.  Respiratory: Negative for cough, sputum, wheezing.   Cardiovascular: See HPI. Gastrointestinal: Negative for abdominal pain, melena, and hematochezia.  Genitourinary: Negative for dysuria and hematuria.  Musculoskeletal: Negative for falls and myalgias.  Skin: Negative  for itching and rash.  Neurological: Negative for focal weakness, focal sensory changes and loss of consciousness.  Endo/Heme/Allergies: Does not bruise/bleed easily.     EKGs/Labs/Other Studies Reviewed:    The following studies were reviewed today: No prior cardiac studies  EKG:  EKG is personally reviewed.  The ekg ordered today demonstrates NSR at 64 bpm  Recent Labs: 03/03/2020: TSH 3.73 05/20/2020: BUN 10; Creatinine, Ser 0.77; Hemoglobin 14.8; Platelets 404; Potassium 3.4; Sodium 142  Recent Lipid Panel    Component Value Date/Time   CHOL 239 (H) 03/03/2020 0951   TRIG 161.0 (H) 03/03/2020 0951   HDL 61.00 03/03/2020 0951   CHOLHDL 4 03/03/2020 0951   VLDL 32.2 03/03/2020 0951   LDLCALC 146 (H) 03/03/2020 0951   LDLDIRECT 181.0 01/01/2017 1135    Physical Exam:    VS:  BP 140/80   Pulse 64   Ht 5' 5"  (1.651 m)   Wt 186 lb 9.6 oz (84.6 kg)  SpO2 97%   BMI 31.05 kg/m     Wt Readings from Last 3 Encounters:  07/01/20 186 lb 9.6 oz (84.6 kg)  05/20/20 180 lb (81.6 kg)  03/03/20 185 lb (83.9 kg)    GEN: Well nourished, well developed in no acute distress HEENT: Normal, moist mucous membranes NECK: No JVD CARDIAC: regular rhythm, normal S1 and S2, no rubs or gallops. No murmurs. VASCULAR: Radial and DP pulses 2+ bilaterally. No carotid bruits RESPIRATORY:  Clear to auscultation without rales, wheezing or rhonchi  ABDOMEN: Soft, non-tender, non-distended MUSCULOSKELETAL:  Ambulates independently SKIN: Warm and dry, no edema NEUROLOGIC:  Alert and oriented x 3. No focal neuro deficits noted. PSYCHIATRIC:  Normal affect    ASSESSMENT:    1. Cardiac symptoms with risk for coronary heart disease between 10% and 20% in next 10 years   2. Essential hypertension   3. Precordial pain   4. Mixed hyperlipidemia   5. Obesity (BMI 30.0-34.9)   6. Cardiac risk counseling   7. Counseling on health promotion and disease prevention   8. Pre-procedure lab exam    PLAN:      Chest pain: ASCVD risk 15% -discussed treadmill stress, nuclear stress/lexiscan, and CT coronary angiography. Discussed pros and cons of each, including but not limited to false positive/false negative risk, radiation risk, and risk of IV contrast dye. Based on shared decision making, decision was made to pursue CT coronary angiography. -will give one time dose of metoprolol 2 hours prior to scheduled test. Resting HR 64 bpm, will give 50 mg metoprolol -counseled on need to get BMET prior to test -counseled on use of sublingual nitroglycerin and its importance to a good test -instructed on red flag warning signs that need immediate medical attention  Hyperlipidemia, mixed: -statin intolerance, has tried atorvastatin, pravastatin, simvastatin, ezetimbe -decision on next step based on results of CT  Hypertension: -occasionally misses HCTZ as she thought she had to wait 4 hours from her synthroid. Reviewed manufacturer's recommendation, no iron/calcium supplements, ok to take HCTZ about an hour from her synthroid. She thinks this will help tremendously. Refilled today. -reports home BP is usually 120s/70s-80s  Obesity, BMI 31: -working on long term changes for weight loss. Has cut out soda, cutting back on sweet tea  Cardiac risk counseling and prevention recommendations: -recommend heart healthy/Mediterranean diet, with whole grains, fruits, vegetable, fish, lean meats, nuts, and olive oil. Limit salt. -recommend moderate walking, 3-5 times/week for 30-50 minutes each session. Aim for at least 150 minutes.week. Goal should be pace of 3 miles/hours, or walking 1.5 miles in 30 minutes -recommend avoidance of tobacco products. Avoid excess alcohol. -Additional risk factor control:  -Diabetes risk: A1c is 5.7  -ASCVD risk score: The 10-year ASCVD risk score Mikey Bussing DC Brooke Bonito., et al., 2013) is: 15.4%   Values used to calculate the score:     Age: 3 years     Sex: Female     Is Non-Hispanic  African American: No     Diabetic: No     Tobacco smoker: No     Systolic Blood Pressure: 983 mmHg     Is BP treated: Yes     HDL Cholesterol: 61 mg/dL     Total Cholesterol: 239 mg/dL    Plan for follow up: if CT unremarkable, follow up in 3 mos  Buford Dresser, MD, PhD [place]  [facility]    Medication Adjustments/Labs and Tests Ordered: Current medicines are reviewed at length with the patient today.  Concerns regarding medicines are outlined above.  Orders Placed This Encounter  Procedures  . CT CORONARY MORPH W/CTA COR W/SCORE W/CA W/CM &/OR WO/CM  . CT CORONARY FRACTIONAL FLOW RESERVE DATA PREP  . CT CORONARY FRACTIONAL FLOW RESERVE FLUID ANALYSIS  . Basic metabolic panel  . EKG 12-Lead   Meds ordered this encounter  Medications  . hydrochlorothiazide (HYDRODIURIL) 25 MG tablet    Sig: TAKE 1 TABLET BY MOUTH EVERY MORNING    Dispense:  90 tablet    Refill:  3  . metoprolol tartrate (LOPRESSOR) 50 MG tablet    Sig: TAKE 1 TABLET 2 HR PRIOR TO CARDIAC PROCEDURE    Dispense:  1 tablet    Refill:  0    Patient Instructions  Medication Instructions:  Your Physician recommend you continue on your current medication as directed.    *If you need a refill on your cardiac medications before your next appointment, please call your pharmacy*   Lab Work: Your physician recommends that you return for lab work today ( BMP)  If you have labs (blood work) drawn today and your tests are completely normal, you will receive your results only by: Marland Kitchen MyChart Message (if you have MyChart) OR . A paper copy in the mail If you have any lab test that is abnormal or we need to change your treatment, we will call you to review the results.   Testing/Procedures: Cardiac CT Angiography (CTA), is a special type of CT scan that uses a computer to produce multi-dimensional views of major blood vessels throughout the body. In CT angiography, a contrast material is injected  through an IV to help visualize the blood vessels [facility]  Follow-Up: At [facility], you and your health needs are our priority.  As part of our continuing mission to provide you with exceptional heart care, we have created designated Provider Care Teams.  These Care Teams include your primary Cardiologist (physician) and Advanced Practice Providers (APPs -  Physician Assistants and Nurse Practitioners) who all work together to provide you with the care you need, when you need it.  We recommend signing up for the patient portal called "MyChart".  Sign up information is provided on this After Visit Summary.  MyChart is used to connect with patients for Virtual Visits (Telemedicine).  Patients are able to view lab/test results, encounter notes, upcoming appointments, etc.  Non-urgent messages can be sent to your provider as well.   To learn more about what you can do with MyChart, go to [email or web address].    Your next appointment:   3 month(s)  The format for your next appointment:   In Person  Provider:   Buford Dresser, MD  Your cardiac CT will be scheduled at one of the below locations:   [facility] [address] [place], [place] [address] [phone]  If scheduled at [facility], please arrive at the [facility] main entrance of [facility] 30 minutes prior to test start time. Proceed to the [facility] Radiology Department (first floor) to check-in and test prep.  If scheduled at [facility], please arrive 15 mins early for check-in and test prep.  Please follow these instructions carefully (unless otherwise directed):   On the Night Before the Test: . Be sure to Drink plenty of water. . Do not consume any caffeinated/decaffeinated beverages or chocolate 12 hours prior to your test. . Do not take any antihistamines 12 hours prior to your  test.   On the Day of the Test: . Drink plenty of  water. Do not drink any water within one hour of the test. . Do not eat any food 4 hours prior to the test. . You may take your regular medications prior to the test.  . Take metoprolol (Lopressor) 50 mg two hours prior to test. . HOLD Hydrochlorothiazide morning of the test. . FEMALES- please wear underwire-free bra if available       After the Test: . Drink plenty of water. . After receiving IV contrast, you may experience a mild flushed feeling. This is normal. . On occasion, you may experience a mild rash up to 24 hours after the test. This is not dangerous. If this occurs, you can take Benadryl 25 mg and increase your fluid intake. . If you experience trouble breathing, this can be serious. If it is severe call 911 IMMEDIATELY. If it is mild, please call our office. . If you take any of these medications: Glipizide/Metformin, Avandament, Glucavance, please do not take 48 hours after completing test unless otherwise instructed.   Once we have confirmed authorization from your insurance company, we will call you to set up a date and time for your test. Based on how quickly your insurance processes prior authorizations requests, please allow up to 4 weeks to be contacted for scheduling your Cardiac CT appointment. Be advised that routine Cardiac CT appointments could be scheduled as many as 8 weeks after your provider has ordered it.  For non-scheduling related questions, please contact the cardiac imaging nurse navigator should you have any questions/concerns: Marchia Bond, Cardiac Imaging Nurse Navigator Burley Saver, Interim Cardiac Imaging Nurse [place] [facility] Direct Office Dial: [phone]   For scheduling needs, including cancellations and rescheduling, please call Vivien Rota at [phone], option 3.         Signed, Buford Dresser, MD PhD 07/01/2020     [place]

## 2020-07-19 ENCOUNTER — Telehealth (HOSPITAL_COMMUNITY): Payer: Self-pay | Admitting: *Deleted

## 2020-07-19 NOTE — Telephone Encounter (Signed)
Attempted to call patient regarding upcoming cardiac CT appointment. Left message on voicemail with name and callback number  Zyaire Mccleod Tai RN Navigator Cardiac Imaging [place] [facility] [phone] Office [phone] Cell  

## 2020-07-20 ENCOUNTER — Telehealth (HOSPITAL_COMMUNITY): Payer: Self-pay | Admitting: Emergency Medicine

## 2020-07-20 NOTE — Telephone Encounter (Signed)
Attempted to call patient regarding upcoming cardiac CT appointment. °Left message on voicemail with name and callback number °Netha Dafoe RN Navigator Cardiac Imaging °[facility] °[phone] Office °[phone] Cell ° °

## 2020-07-21 ENCOUNTER — Encounter (HOSPITAL_COMMUNITY): Payer: Self-pay

## 2020-07-21 ENCOUNTER — Ambulatory Visit (HOSPITAL_COMMUNITY)
Admission: RE | Admit: 2020-07-21 | Discharge: 2020-07-21 | Disposition: A | Discharge status: Home / Self Care | Payer: Medicare PPO | Source: Ambulatory Visit | Attending: Cardiology | Admitting: Cardiology

## 2020-07-21 ENCOUNTER — Other Ambulatory Visit: Payer: Self-pay

## 2020-07-21 DIAGNOSIS — R072 Precordial pain: Secondary | ICD-10-CM | POA: Diagnosis not present

## 2020-07-21 MED ORDER — IOHEXOL 350 MG/ML SOLN
80.0000 mL | Freq: Once | INTRAVENOUS | Status: AC | PRN
  Administered 2020-07-21: 80 mL via INTRAVENOUS

## 2020-07-21 MED ORDER — NITROGLYCERIN 0.4 MG SL SUBL
SUBLINGUAL_TABLET | SUBLINGUAL | Status: AC
  Administered 2020-07-21: 0.8 mg via SUBLINGUAL
  Filled 2020-07-21: qty 2

## 2020-07-21 MED ORDER — NITROGLYCERIN 0.4 MG SL SUBL: 0.8000 mg | SUBLINGUAL_TABLET | Freq: Once | SUBLINGUAL | Status: AC

## 2020-07-30 ENCOUNTER — Ambulatory Visit
Admission: RE | Admit: 2020-07-30 | Discharge: 2020-07-30 | Disposition: A | Discharge status: Home / Self Care | Payer: Medicare PPO | Source: Ambulatory Visit | Attending: Family Medicine | Admitting: Family Medicine

## 2020-07-30 ENCOUNTER — Other Ambulatory Visit: Payer: Self-pay

## 2020-07-30 DIAGNOSIS — Z1231 Encounter for screening mammogram for malignant neoplasm of breast: Secondary | ICD-10-CM

## 2020-08-15 ENCOUNTER — Encounter: Payer: Self-pay | Admitting: Cardiology

## 2020-08-28 ENCOUNTER — Other Ambulatory Visit: Payer: Self-pay

## 2020-08-28 ENCOUNTER — Telehealth (INDEPENDENT_AMBULATORY_CARE_PROVIDER_SITE_OTHER): Payer: Medicare PPO | Admitting: Family Medicine

## 2020-08-28 ENCOUNTER — Encounter: Payer: Self-pay | Admitting: Family Medicine

## 2020-08-28 VITALS — Ht 65.0 in | Wt 186.0 lb

## 2020-08-28 DIAGNOSIS — J0100 Acute maxillary sinusitis, unspecified: Secondary | ICD-10-CM

## 2020-08-28 MED ORDER — AMOXICILLIN-POT CLAVULANATE 875-125 MG PO TABS
1.0000 | ORAL_TABLET | Freq: Two times a day (BID) | ORAL | 0 refills | Status: AC
Start: 2020-08-28 — End: 2020-09-07

## 2020-08-28 NOTE — Progress Notes (Signed)
Virtual visit completed through MyChart, a video enabled telemedicine application. Due to national recommendations of social distancing due to COVID-19, a virtual visit is felt to be most appropriate for this patient at this time. Reviewed limitations, risks, security and privacy concerns of performing a virtual visit and the availability of in person appointments. I also reviewed that there may be a patient responsible charge related to this service. The patient agreed to proceed.   Patient location: home Provider location: [place] at [facility], office Persons participating in this virtual visit: patient, provider   If any vitals were documented, they were collected by patient at home unless specified below.    Ht 5\' 5"  (1.651 m)    Wt 186 lb (84.4 kg)    BMI 30.95 kg/m    CC: ear pain, fever Subjective:    Patient ID: Nicole Daniels, female    DOB: 17-Oct-1951, 69 y.o.   MRN: [id number]  HPI: Nicole Daniels is a 69 y.o. female presenting on 08/28/2020 for Ear Pain (x 4 days rt > lt with pain in face and teeth ), Cough (productive yellow x 4 days increased in last 2 days ), and Fever (low off and on )   5d h/o HA, sneezing, cough which has progressively worsened. ST with PNDrainage. Notes increased R ear pain and bilateral maxillary sinus discomfort. Hoarse voice which is slowly improving. Cough productive of yellow sputum. Some tooth pain. Mild nausea/diarrhea.   No fevers/chills, abd pain, dyspnea or wheezing.   She states she frequently gets sinus infections that need abx to resolve. No recent infection.   Managing with sinus severe without much benefit. Also has deslym.  She has not tried flonase yet.   No sick contacts at home - she lives alone.  No smokers at home. No h/o asthma.  No known COVID exposure    Recently completed [place] vaccine series 05/2020, 06/2020.       Relevant past medical, surgical, family and social history reviewed and updated as  indicated. Interim medical history since our last visit reviewed. Allergies and medications reviewed and updated. Outpatient Medications Prior to Visit  Medication Sig Dispense Refill   Cholecalciferol (VITAMIN D3) 2000 units TABS Take 2,000 Units by mouth daily.     fluticasone (FLONASE) 50 MCG/ACT nasal spray Place 1 spray into both nostrils as needed for allergies.      hydrochlorothiazide (HYDRODIURIL) 25 MG tablet TAKE 1 TABLET BY MOUTH EVERY MORNING 90 tablet 3   levothyroxine (SYNTHROID) 25 MCG tablet Take 1 tablet (25 mcg total) by mouth daily before breakfast. 90 tablet 3   metoprolol tartrate (LOPRESSOR) 50 MG tablet TAKE 1 TABLET 2 HR PRIOR TO CARDIAC PROCEDURE 1 tablet 0   No facility-administered medications prior to visit.     Per HPI unless specifically indicated in ROS section below Review of Systems Objective:  Ht 5\' 5"  (1.651 m)    Wt 186 lb (84.4 kg)    BMI 30.95 kg/m   Wt Readings from Last 3 Encounters:  08/28/20 186 lb (84.4 kg)  07/01/20 186 lb 9.6 oz (84.6 kg)  05/20/20 180 lb (81.6 kg)       Physical exam: Gen: alert, NAD, tired appearing, hoarseness Pulm: speaks in complete sentences without increased work of breathing Psych: normal mood, normal thought content      Assessment & Plan:   Problem List Items Addressed This Visit    Acute sinusitis - Primary    Anticipate viral  sinusitis given short duration.  Supportive care reviewed - fluids, rest, flonase, nasal saline irrigation and/or neti pot, OTC cough syrup.  Sxs not consistent with COVID.  WASP for augmentin 7-10 day course sent to pharmacy with indications when to take (ie symptoms ongoing past 10 days, new fever, worsening unilateral facial pain, or worsening instead of improving).       Relevant Medications   amoxicillin-clavulanate (AUGMENTIN) 875-125 MG tablet       Meds ordered this encounter  Medications   amoxicillin-clavulanate (AUGMENTIN) 875-125 MG tablet    Sig: Take 1  tablet by mouth 2 (two) times daily for 10 days.    Dispense:  20 tablet    Refill:  0   No orders of the defined types were placed in this encounter.   I discussed the assessment and treatment plan with the patient. The patient was provided an opportunity to ask questions and all were answered. The patient agreed with the plan and demonstrated an understanding of the instructions. The patient was advised to call back or seek an in-person evaluation if the symptoms worsen or if the condition fails to improve as anticipated.  Follow up plan: No follow-ups on file.  Ria Bush, MD

## 2020-08-28 NOTE — Assessment & Plan Note (Signed)
Anticipate viral sinusitis given short duration.  Supportive care reviewed - fluids, rest, flonase, nasal saline irrigation and/or neti pot, OTC cough syrup.  Sxs not consistent with COVID.  WASP for augmentin 7-10 day course sent to pharmacy with indications when to take (ie symptoms ongoing past 10 days, new fever, worsening unilateral facial pain, or worsening instead of improving).

## 2020-08-30 ENCOUNTER — Ambulatory Visit
Admission: RE | Admit: 2020-08-30 | Discharge: 2020-08-30 | Disposition: A | Discharge status: Home / Self Care | Payer: Medicare PPO | Source: Ambulatory Visit | Attending: Family Medicine | Admitting: Family Medicine

## 2020-08-30 ENCOUNTER — Other Ambulatory Visit: Payer: Self-pay

## 2020-08-30 DIAGNOSIS — Z1231 Encounter for screening mammogram for malignant neoplasm of breast: Secondary | ICD-10-CM | POA: Diagnosis not present

## 2020-10-05 ENCOUNTER — Ambulatory Visit: Payer: Medicare PPO | Admitting: Cardiology

## 2020-10-05 ENCOUNTER — Other Ambulatory Visit: Payer: Self-pay

## 2020-10-05 ENCOUNTER — Encounter: Payer: Self-pay | Admitting: Cardiology

## 2020-10-05 VITALS — BP 132/82 | HR 74 | Ht 65.0 in | Wt 187.0 lb

## 2020-10-05 DIAGNOSIS — Z712 Person consulting for explanation of examination or test findings: Secondary | ICD-10-CM | POA: Diagnosis not present

## 2020-10-05 DIAGNOSIS — Z7189 Other specified counseling: Secondary | ICD-10-CM | POA: Diagnosis not present

## 2020-10-05 DIAGNOSIS — E669 Obesity, unspecified: Secondary | ICD-10-CM

## 2020-10-05 DIAGNOSIS — E782 Mixed hyperlipidemia: Secondary | ICD-10-CM

## 2020-10-05 DIAGNOSIS — I10 Essential (primary) hypertension: Secondary | ICD-10-CM | POA: Diagnosis not present

## 2020-10-05 NOTE — Patient Instructions (Signed)
Medication Instructions:  °Your Physician recommend you continue on your current medication as directed.   ° °*If you need a refill on your cardiac medications before your next appointment, please call your pharmacy* ° ° °Lab Work: °None ° ° °Testing/Procedures: °None ° ° °Follow-Up: °At [facility], you and your health needs are our priority.  As part of our continuing mission to provide you with exceptional heart care, we have created designated Provider Care Teams.  These Care Teams include your primary Cardiologist (physician) and Advanced Practice Providers (APPs -  Physician Assistants and Nurse Practitioners) who all work together to provide you with the care you need, when you need it. ° °We recommend signing up for the patient portal called "MyChart".  Sign up information is provided on this After Visit Summary.  MyChart is used to connect with patients for Virtual Visits (Telemedicine).  Patients are able to view lab/test results, encounter notes, upcoming appointments, etc.  Non-urgent messages can be sent to your provider as well.   °To learn more about what you can do with MyChart, go to [email or web address].   ° °Your next appointment:   °As needed ° °The format for your next appointment:   °In Person ° °Provider:   °Bridgette Christopher, MD ° ° ° °

## 2020-10-05 NOTE — Progress Notes (Signed)
Cardiology Office Note:    Date:  10/05/2020   ID:  Nicole Daniels, DOB August 07, 1951, MRN [id number]  PCP:  Ronnald Nian, DO  Cardiologist:  Buford Dresser, MD  Referring MD: Ronnald Nian, DO   CC: follow up  History of Present Illness:    Nicole Daniels is a 69 y.o. female with a hx of hypertension, hypothyroidism, hyperlipidemia, reported statin intolerance who is seen for follow up. I met her 07/01/20 as a new consult at the request of Ronnald Nian, DO for the evaluation and management of chest pain.  Comorbidities: hypertension, high cholesterol, hypothyroidism. Was tried on simvastatin 40 mg, pravastatin 20 mg, atorvastatin 45m. Also tried ezetimibe 10 mg in 2014. All gave her myalgias.  Today: Reviewed results of cardiac CT from 07/21/20, with calcium score of 0 and no CAD. Doing well overall, rare indigestion. Did have an episode of severe pain down the left arm after exertion. Was a shooting pain down her arm, has pinched nerves before and this was similar. Took ibuprofen and resolved. No pain in her chest with this.   Denies shortness of breath at rest or with normal exertion. No PND, orthopnea, LE edema or unexpected weight gain. No syncope or palpitations.  ROS positive for intermittent sinus congestion.  Past Medical History:  Diagnosis Date  . Abdominal pain, right lower quadrant   . Acquired cyst of kidney 01/08/2012   pt denies history of   . Acute sinusitis, unspecified   . Adenomatous colon polyp   . Angiomyolipoma    kidney  . Chest pain   . Chest pain, unspecified   . Depressive disorder, not elsewhere classified 01/08/2012   not currently   . Diverticulosis   . Dizziness and giddiness   . Dysfunction of eustachian tube   . Hemorrhoids   . History of diverticulitis of colon   . IBS (irritable bowel syndrome)   . Liver lesion 2011  . Neoplasm of skin of face    malignant  . OA (osteoarthritis)    knee  . Obesity,  unspecified   . Ostium secundum type atrial septal defect   . Other and unspecified hyperlipidemia   . Other malaise and fatigue   . Palpitations   . Shortness of breath   . Unspecified essential hypertension    pt states she does not have HTN  . Unspecified sinusitis (chronic)   . Unspecified vitamin D deficiency     Past Surgical History:  Procedure Laterality Date  . ABDOMINAL HYSTERECTOMY    . BREAST REDUCTION SURGERY  1990  . CERVICAL DISC SURGERY     Dr.Nudleman  . CHOLECYSTECTOMY    . COLONOSCOPY    . DILATION AND CURETTAGE OF UTERUS    . EYE SURGERY Bilateral 07/30/2017   cataract sx  . FLEXIBLE SIGMOIDOSCOPY  01/08/2012   Procedure: FLEXIBLE SIGMOIDOSCOPY;  Surgeon: DLafayette Dragon MD;  Location: [facility];  Service: Endoscopy;  Laterality: N/A;  . HEMORRHOID SURGERY    . KNEE ARTHROTOMY     pt states no   . LAMINECTOMY N/A 01/31/2017   Procedure: Thoracic Laminectomy and marsupialization of arachnoid cyst;  Surgeon: RJovita Gamma MD;  [place] [facility]  Service: Neurosurgery;  Laterality: N/A;  . LEFT OOPHORECTOMY  1976  . NECK SURGERY N/A 03/2018  . PARTIAL HYSTERECTOMY     right ovary remains  . REDUCTION MAMMAPLASTY    . TONSILLECTOMY  1972    Current Medications: Current Outpatient Medications  on File Prior to Visit  Medication Sig  . Cholecalciferol (VITAMIN D3) 2000 units TABS Take 2,000 Units by mouth daily.  . fluticasone (FLONASE) 50 MCG/ACT nasal spray Place 1 spray into both nostrils as needed for allergies.   . hydrochlorothiazide (HYDRODIURIL) 25 MG tablet TAKE 1 TABLET BY MOUTH EVERY MORNING  . levothyroxine (SYNTHROID) 25 MCG tablet Take 1 tablet (25 mcg total) by mouth daily before breakfast.   No current facility-administered medications on file prior to visit.     Allergies:   Simvastatin, Statins, Bactrim [sulfamethoxazole-trimethoprim], and Sertraline   Social History   Tobacco Use  . 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 Smokeless  tobacco: Never Used  Vaping Use  . Vaping Use: Never used  Substance Use Topics  . Alcohol use: No  . Drug use: No    Family History: family history includes Breast cancer in her cousin; Cirrhosis in her father; Diabetes in her cousin, mother, sister, and another family member; Heart disease in her brother and father; Hypertension in her mother and sister; Stroke in an other family member. There is no history of Colon cancer.  brother Louie Casa had to have pacemaker put in. Father died age 37 of liver cirrhosis, no known heart issues. Mother didn't have heart issues that she knows of.  ROS:   Please see the history of present illness.  Additional pertinent ROS otherwise unremarkable.     EKGs/Labs/Other Studies Reviewed:    The following studies were reviewed today: CT cardiac 07/21/20 FINDINGS: Coronary calcium score: The patient's coronary artery calcium score is 0, which places the patient in the 0 percentile.  Coronary arteries: Normal coronary origins.  Right dominance.  Right Coronary Artery: Normal caliber vessel, gives rise to PDA. No significant plaque or stenosis.  Left Main Coronary Artery: Normal caliber vessel. No significant plaque or stenosis.  Left Anterior Descending Coronary Artery: Normal caliber vessel. No significant plaque or stenosis. Gives rise to 2 medium diagonal branches. Distal LAD wraps apex.  Left Circumflex Artery: Normal caliber vessel. No significant plaque or stenosis. Gives rise to OM branches.  Aorta: Normal size, 35 mm at the mid ascending aorta (level of the PA bifurcation) measured double oblique. Aortic atherosclerosis. No dissection.  Aortic Valve: No calcifications. Trileaflet.  Other findings:  Normal pulmonary vein drainage into the left atrium.  Normal left atrial appendage without a thrombus.  Normal size of the pulmonary artery.  IMPRESSION: 1. No evidence of CAD, CADRADS = 0.  2. Coronary calcium score of  0. This was 0 percentile for age and sex matched control.  3. Normal coronary origin with right dominance.  EKG:  EKG is personally reviewed.  The ekg ordered 07/01/20 demonstrates NSR at 64 bpm  Recent Labs: 03/03/2020: TSH 3.73 05/20/2020: Hemoglobin 14.8; Platelets 404 07/01/2020: BUN 11; Creatinine, Ser 0.78; Potassium 3.9; Sodium 142  Recent Lipid Panel    Component Value Date/Time   CHOL 239 (H) 03/03/2020 0951   TRIG 161.0 (H) 03/03/2020 0951   HDL 61.00 03/03/2020 0951   CHOLHDL 4 03/03/2020 0951   VLDL 32.2 03/03/2020 0951   LDLCALC 146 (H) 03/03/2020 0951   LDLDIRECT 181.0 01/01/2017 1135    Physical Exam:    VS:  BP 132/82   Pulse 74   Ht 5' 5"  (1.651 m)   Wt 187 lb (84.8 kg)   SpO2 97%   BMI 31.12 kg/m     Wt Readings from Last 3 Encounters:  10/05/20 187 lb (84.8 kg)  08/28/20 186 lb (84.4 kg)  07/01/20 186 lb 9.6 oz (84.6 kg)    GEN: Well nourished, well developed in no acute distress HEENT: Normal, moist mucous membranes NECK: No JVD CARDIAC: regular rhythm, normal S1 and S2, no rubs or gallops. No murmur. VASCULAR: Radial and DP pulses 2+ bilaterally. No carotid bruits RESPIRATORY:  Clear to auscultation without rales, wheezing or rhonchi  ABDOMEN: Soft, non-tender, non-distended MUSCULOSKELETAL:  Ambulates independently SKIN: Warm and dry, no edema NEUROLOGIC:  Alert and oriented x 3. No focal neuro deficits noted. PSYCHIATRIC:  Normal affect   ASSESSMENT:    1. Encounter to discuss test results   2. Essential hypertension   3. Mixed hyperlipidemia   4. Obesity (BMI 30.0-34.9)   5. Cardiac risk counseling   6. Counseling on health promotion and disease prevention    PLAN:    Chest pain: ASCVD risk 15% -cardiac CT without calcium or plaque, very reassuring -instructed on red flag warning signs that need immediate medical attention  Hyperlipidemia, mixed: -statin intolerance, has tried atorvastatin, pravastatin, simvastatin, ezetimbe -based  on CT results, does not need statin -discussed diet, exercise recommendations today  Hypertension: -doing well, continue HCTZ  Obesity, BMI 31: -working on long term changes for weight loss. Discussed lifestyle recommendations, below  Cardiac risk counseling and prevention recommendations: -recommend heart healthy/Mediterranean diet, with whole grains, fruits, vegetable, fish, lean meats, nuts, and olive oil. Limit salt. -recommend moderate walking, 3-5 times/week for 30-50 minutes each session. Aim for at least 150 minutes.week. Goal should be pace of 3 miles/hours, or walking 1.5 miles in 30 minutes -recommend avoidance of tobacco products. Avoid excess alcohol. -Additional risk factor control:  -Diabetes risk: A1c is 5.7  -ASCVD risk score: but as above, with calcium score of 0, does not require statin The 10-year ASCVD risk score Mikey Bussing DC Jr., et al., 2013) is: 13.8%   Values used to calculate the score:     Age: 11 years     Sex: Female     Is Non-Hispanic African American: No     Diabetic: No     Tobacco smoker: No     Systolic Blood Pressure: 352 mmHg     Is BP treated: Yes     HDL Cholesterol: 61 mg/dL     Total Cholesterol: 239 mg/dL    Plan for follow up: if CT unremarkable, follow up in 3 mos  Buford Dresser, MD, PhD [place]  [facility]    Medication Adjustments/Labs and Tests Ordered: Current medicines are reviewed at length with the patient today.  Concerns regarding medicines are outlined above.  No orders of the defined types were placed in this encounter.  No orders of the defined types were placed in this encounter.   Patient Instructions  Medication Instructions:  Your Physician recommend you continue on your current medication as directed.    *If you need a refill on your cardiac medications before your next appointment, please call your pharmacy*   Lab Work: None  Testing/Procedures: None   Follow-Up: At [facility], you  and your health needs are our priority.  As part of our continuing mission to provide you with exceptional heart care, we have created designated Provider Care Teams.  These Care Teams include your primary Cardiologist (physician) and Advanced Practice Providers (APPs -  Physician Assistants and Nurse Practitioners) who all work together to provide you with the care you need, when you need it.  We recommend signing up for the patient portal  called "MyChart".  Sign up information is provided on this After Visit Summary.  MyChart is used to connect with patients for Virtual Visits (Telemedicine).  Patients are able to view lab/test results, encounter notes, upcoming appointments, etc.  Non-urgent messages can be sent to your provider as well.   To learn more about what you can do with MyChart, go to [email or web address].    Your next appointment:   As needed   The format for your next appointment:   In Person  Provider:   Buford Dresser, MD      Signed, Buford Dresser, MD PhD 10/05/2020     [place]

## 2021-01-04 DIAGNOSIS — M5412 Radiculopathy, cervical region: Secondary | ICD-10-CM | POA: Diagnosis not present

## 2021-01-05 ENCOUNTER — Other Ambulatory Visit: Payer: Self-pay | Admitting: Neurosurgery

## 2021-01-05 DIAGNOSIS — M5412 Radiculopathy, cervical region: Secondary | ICD-10-CM

## 2021-01-18 DIAGNOSIS — H0288A Meibomian gland dysfunction right eye, upper and lower eyelids: Secondary | ICD-10-CM | POA: Diagnosis not present

## 2021-01-18 DIAGNOSIS — Z9842 Cataract extraction status, left eye: Secondary | ICD-10-CM | POA: Diagnosis not present

## 2021-01-18 DIAGNOSIS — Z9841 Cataract extraction status, right eye: Secondary | ICD-10-CM | POA: Diagnosis not present

## 2021-01-18 DIAGNOSIS — H52223 Regular astigmatism, bilateral: Secondary | ICD-10-CM | POA: Diagnosis not present

## 2021-01-18 DIAGNOSIS — H0288B Meibomian gland dysfunction left eye, upper and lower eyelids: Secondary | ICD-10-CM | POA: Diagnosis not present

## 2021-01-24 ENCOUNTER — Ambulatory Visit
Admission: RE | Admit: 2021-01-24 | Discharge: 2021-01-24 | Disposition: A | Discharge status: Home / Self Care | Payer: Medicare PPO | Source: Ambulatory Visit | Attending: Neurosurgery | Admitting: Neurosurgery

## 2021-01-24 ENCOUNTER — Other Ambulatory Visit: Payer: Self-pay

## 2021-01-24 DIAGNOSIS — M5412 Radiculopathy, cervical region: Secondary | ICD-10-CM

## 2021-01-24 DIAGNOSIS — M4802 Spinal stenosis, cervical region: Secondary | ICD-10-CM | POA: Diagnosis not present

## 2021-02-01 DIAGNOSIS — M5412 Radiculopathy, cervical region: Secondary | ICD-10-CM | POA: Diagnosis not present

## 2021-02-02 ENCOUNTER — Other Ambulatory Visit: Payer: Self-pay | Admitting: Neurosurgery

## 2021-02-02 ENCOUNTER — Telehealth: Payer: Self-pay | Admitting: Family Medicine

## 2021-02-02 DIAGNOSIS — E049 Nontoxic goiter, unspecified: Secondary | ICD-10-CM

## 2021-02-02 DIAGNOSIS — E039 Hypothyroidism, unspecified: Secondary | ICD-10-CM

## 2021-02-02 NOTE — Telephone Encounter (Signed)
Does pt have to come in for this?

## 2021-02-02 NOTE — Telephone Encounter (Signed)
Pt called and said she had talked with her neurologist and they were discussing her MRI and said that she should follow up with pcp about getting a [place] of her thyroid

## 2021-02-02 NOTE — Telephone Encounter (Signed)
Reviewed result of MRI cervical spine ordered by neurosurgeon Dr. Trenton Gammon. thyroid is diffusely enlarged which is not necessarily surprising as pt has hypothyroidism. Thyroid US ordered and [facility] will call pt to schedule.

## 2021-02-16 DIAGNOSIS — M6281 Muscle weakness (generalized): Secondary | ICD-10-CM | POA: Diagnosis not present

## 2021-02-16 DIAGNOSIS — M5413 Radiculopathy, cervicothoracic region: Secondary | ICD-10-CM | POA: Diagnosis not present

## 2021-02-16 DIAGNOSIS — M2569 Stiffness of other specified joint, not elsewhere classified: Secondary | ICD-10-CM | POA: Diagnosis not present

## 2021-02-16 DIAGNOSIS — M546 Pain in thoracic spine: Secondary | ICD-10-CM | POA: Diagnosis not present

## 2021-02-18 ENCOUNTER — Ambulatory Visit
Admission: RE | Admit: 2021-02-18 | Discharge: 2021-02-18 | Disposition: A | Discharge status: Home / Self Care | Payer: Medicare PPO | Source: Ambulatory Visit | Attending: Family Medicine | Admitting: Family Medicine

## 2021-02-18 DIAGNOSIS — E039 Hypothyroidism, unspecified: Secondary | ICD-10-CM | POA: Diagnosis not present

## 2021-02-18 DIAGNOSIS — E049 Nontoxic goiter, unspecified: Secondary | ICD-10-CM | POA: Diagnosis not present

## 2021-03-04 ENCOUNTER — Ambulatory Visit: Payer: Medicare PPO | Admitting: Family Medicine

## 2021-03-08 ENCOUNTER — Ambulatory Visit (INDEPENDENT_AMBULATORY_CARE_PROVIDER_SITE_OTHER): Payer: Medicare PPO

## 2021-03-08 VITALS — Ht 65.0 in | Wt 187.0 lb

## 2021-03-08 DIAGNOSIS — Z0000 Encounter for general adult medical examination without abnormal findings: Secondary | ICD-10-CM

## 2021-03-08 NOTE — Patient Instructions (Signed)
Nicole Daniels , Thank you for taking time to complete your Medicare Wellness Visit. I appreciate your ongoing commitment to your health goals. Please review the following plan we discussed and let me know if I can assist you in the future.   Screening recommendations/referrals: Colonoscopy: Due-Declined today. Please call the office when you are ready to schedule. Mammogram: Completed 08/30/2020-Due 08/30/2021 Bone Density: Due-Declined today but per our conversation you will consider having this done with your next mammogram. Recommended yearly ophthalmology/optometry visit for glaucoma screening and checkup Recommended yearly dental visit for hygiene and checkup  Vaccinations: Influenza vaccine: Declined Pneumococcal vaccine: Due-May obtain vaccine at our office or your local pharmacy. Tdap vaccine: Discuss with pharmacy Shingles vaccine: Discuss with pharmacy  Covid-19:Booster due-May obtain vaccine at your local pharmacy.  Advanced directives: Copy in chart  Conditions/risks identified: See problem list  Next appointment: Follow up in one year for your annual wellness visit 03/14/22 @ 9:00   Preventive Care 65 Years and Older, Female Preventive care refers to lifestyle choices and visits with your health care provider that can promote health and wellness. What does preventive care include?  A yearly physical exam. This is also called an annual well check.  Dental exams once or twice a year.  Routine eye exams. Ask your health care provider how often you should have your eyes checked.  Personal lifestyle choices, including:  Daily care of your teeth and gums.  Regular physical activity.  Eating a healthy diet.  Avoiding tobacco and drug use.  Limiting alcohol use.  Practicing safe sex.  Taking low-dose aspirin every day.  Taking vitamin and mineral supplements as recommended by your health care provider. What happens during an annual well check? The services and  screenings done by your health care provider during your annual well check will depend on your age, overall health, lifestyle risk factors, and family history of disease. Counseling  Your health care provider may ask you questions about your:  Alcohol use.  Tobacco use.  Drug use.  Emotional well-being.  Home and relationship well-being.  Sexual activity.  Eating habits.  History of falls.  Memory and ability to understand (cognition).  Work and work Statistician.  Reproductive health. Screening  You may have the following tests or measurements:  Height, weight, and BMI.  Blood pressure.  Lipid and cholesterol levels. These may be checked every 5 years, or more frequently if you are over 39 years old.  Skin check.  Lung cancer screening. You may have this screening every year starting at age 31 if you have a 30-pack-year history of smoking and currently smoke or have quit within the past 15 years.  Fecal occult blood test (FOBT) of the stool. You may have this test every year starting at age 42.  Flexible sigmoidoscopy or colonoscopy. You may have a sigmoidoscopy every 5 years or a colonoscopy every 10 years starting at age 71.  Hepatitis C blood test.  Hepatitis B blood test.  Sexually transmitted disease (STD) testing.  Diabetes screening. This is done by checking your blood sugar (glucose) after you have not eaten for a while (fasting). You may have this done every 1-3 years.  Bone density scan. This is done to screen for osteoporosis. You may have this done starting at age 62.  Mammogram. This may be done every 1-2 years. Talk to your health care provider about how often you should have regular mammograms. Talk with your health care provider about your test results, treatment options,  and if necessary, the need for more tests. Vaccines  Your health care provider may recommend certain vaccines, such as:  Influenza vaccine. This is recommended every  year.  Tetanus, diphtheria, and acellular pertussis (Tdap, Td) vaccine. You may need a Td booster every 10 years.  Zoster vaccine. You may need this after age 86.  Pneumococcal 13-valent conjugate (PCV13) vaccine. One dose is recommended after age 48.  Pneumococcal polysaccharide (PPSV23) vaccine. One dose is recommended after age 22. Talk to your health care provider about which screenings and vaccines you need and how often you need them. This information is not intended to replace advice given to you by your health care provider. Make sure you discuss any questions you have with your health care provider. Document Released: 11/12/2015 Document Revised: 07/05/2016 Document Reviewed: 08/17/2015 Elsevier Interactive Patient Education  2017 [place] Prevention in the Home Falls can cause injuries. They can happen to people of all ages. There are many things you can do to make your home safe and to help prevent falls. What can I do on the outside of my home?  Regularly fix the edges of walkways and driveways and fix any cracks.  Remove anything that might make you trip as you walk through a door, such as a raised step or threshold.  Trim any bushes or trees on the path to your home.  Use bright outdoor lighting.  Clear any walking paths of anything that might make someone trip, such as rocks or tools.  Regularly check to see if handrails are loose or broken. Make sure that both sides of any steps have handrails.  Any raised decks and porches should have guardrails on the edges.  Have any leaves, snow, or ice cleared regularly.  Use sand or salt on walking paths during winter.  Clean up any spills in your garage right away. This includes oil or grease spills. What can I do in the bathroom?  Use night lights.  Install grab bars by the toilet and in the tub and shower. Do not use towel bars as grab bars.  Use non-skid mats or decals in the tub or shower.  If you  need to sit down in the shower, use a plastic, non-slip stool.  Keep the floor dry. Clean up any water that spills on the floor as soon as it happens.  Remove soap buildup in the tub or shower regularly.  Attach bath mats securely with double-sided non-slip rug tape.  Do not have throw rugs and other things on the floor that can make you trip. What can I do in the bedroom?  Use night lights.  Make sure that you have a light by your bed that is easy to reach.  Do not use any sheets or blankets that are too big for your bed. They should not hang down onto the floor.  Have a firm chair that has side arms. You can use this for support while you get dressed.  Do not have throw rugs and other things on the floor that can make you trip. What can I do in the kitchen?  Clean up any spills right away.  Avoid walking on wet floors.  Keep items that you use a lot in easy-to-reach places.  If you need to reach something above you, use a strong step stool that has a grab bar.  Keep electrical cords out of the way.  Do not use floor polish or wax that makes floors slippery. If  you must use wax, use non-skid floor wax.  Do not have throw rugs and other things on the floor that can make you trip. What can I do with my stairs?  Do not leave any items on the stairs.  Make sure that there are handrails on both sides of the stairs and use them. Fix handrails that are broken or loose. Make sure that handrails are as long as the stairways.  Check any carpeting to make sure that it is firmly attached to the stairs. Fix any carpet that is loose or worn.  Avoid having throw rugs at the top or bottom of the stairs. If you do have throw rugs, attach them to the floor with carpet tape.  Make sure that you have a light switch at the top of the stairs and the bottom of the stairs. If you do not have them, ask someone to add them for you. What else can I do to help prevent falls?  Wear shoes  that:  Do not have high heels.  Have rubber bottoms.  Are comfortable and fit you well.  Are closed at the toe. Do not wear sandals.  If you use a stepladder:  Make sure that it is fully opened. Do not climb a closed stepladder.  Make sure that both sides of the stepladder are locked into place.  Ask someone to hold it for you, if possible.  Clearly mark and make sure that you can see:  Any grab bars or handrails.  First and last steps.  Where the edge of each step is.  Use tools that help you move around (mobility aids) if they are needed. These include:  Canes.  Walkers.  Scooters.  Crutches.  Turn on the lights when you go into a dark area. Replace any light bulbs as soon as they burn out.  Set up your furniture so you have a clear path. Avoid moving your furniture around.  If any of your floors are uneven, fix them.  If there are any pets around you, be aware of where they are.  Review your medicines with your doctor. Some medicines can make you feel dizzy. This can increase your chance of falling. Ask your doctor what other things that you can do to help prevent falls. This information is not intended to replace advice given to you by your health care provider. Make sure you discuss any questions you have with your health care provider. Document Released: 08/12/2009 Document Revised: 03/23/2016 Document Reviewed: 11/20/2014 Elsevier Interactive Patient Education  2017 [employer].

## 2021-03-08 NOTE — Progress Notes (Signed)
Subjective:   Nicole Daniels is a 70 y.o. female who presents for Medicare Annual (Subsequent) preventive examination.  I connected with Nyairah today by telephone and verified that I am speaking with the correct person using two identifiers. Location patient: home Location provider: work Persons participating in the virtual visit: patient, Engineer, civil (consulting).    I discussed the limitations, risks, security and privacy concerns of performing an evaluation and management service by telephone and the availability of in person appointments. I also discussed with the patient that there may be a patient responsible charge related to this service. The patient expressed understanding and verbally consented to this telephonic visit.    Interactive audio and video telecommunications were attempted between this provider and patient, however failed, due to patient having technical difficulties OR patient did not have access to video capability.  We continued and completed visit with audio only.  Some vital signs may be absent or patient reported.   Time Spent with patient on telephone encounter: 25 minutes   Review of Systems     Cardiac Risk Factors include: advanced age (>63men, >62 women);hypertension;dyslipidemia;obesity (BMI >30kg/m2)     Objective:    Today's Vitals   03/08/21 0901  Weight: 187 lb (84.8 kg)  Height: 5\' 5"  (1.651 m)   Body mass index is 31.12 kg/m.  Advanced Directives 03/08/2021 03/03/2020 02/26/2019 12/23/2018 02/20/2018 05/14/2017 02/01/2017  Does Patient Have a Medical Advance Directive? Yes Yes Yes Yes Yes Yes Yes  Type of Estate agent of [place];Living will Healthcare Power of [place];Living will Healthcare Power of [place];Living will Living will;Healthcare Power of [place];Out of facility DNR (pink MOST or yellow form) Healthcare Power of [place];Living will Living will Healthcare Power of [place];Living will  Does patient want to make changes to  medical advance directive? - No - Patient declined No - Patient declined No - Patient declined No - Patient declined No - Patient declined Yes (Inpatient - patient requests chaplain consult to change a medical advance directive)  Copy of Healthcare Power of Attorney in Chart? Yes - validated most recent copy scanned in chart (See row information) Yes - validated most recent copy scanned in chart (See row information) Yes - validated most recent copy scanned in chart (See row information) - No - copy requested - Yes  Pre-existing out of facility DNR order (yellow form or pink MOST form) - - - - - - -    Current Medications (verified) Outpatient Encounter Medications as of 03/08/2021  Medication Sig  . Cholecalciferol (VITAMIN D3) 2000 units TABS Take 2,000 Units by mouth daily.  . fluticasone (FLONASE) 50 MCG/ACT nasal spray Place 1 spray into both nostrils as needed for allergies.   . hydrochlorothiazide (HYDRODIURIL) 25 MG tablet TAKE 1 TABLET BY MOUTH EVERY MORNING  . levothyroxine (SYNTHROID) 25 MCG tablet Take 1 tablet (25 mcg total) by mouth daily before breakfast.   No facility-administered encounter medications on file as of 03/08/2021.    Allergies (verified) Simvastatin, Statins, Bactrim [sulfamethoxazole-trimethoprim], and Sertraline   History: Past Medical History:  Diagnosis Date  . Abdominal pain, right lower quadrant   . Acquired cyst of kidney 01/08/2012   pt denies history of   . Acute sinusitis, unspecified   . Adenomatous colon polyp   . Angiomyolipoma    kidney  . Chest pain   . Chest pain, unspecified   . Depressive disorder, not elsewhere classified 01/08/2012   not currently   . Diverticulosis   . Dizziness and  giddiness   . Dysfunction of eustachian tube   . Hemorrhoids   . History of diverticulitis of colon   . IBS (irritable bowel syndrome)   . Liver lesion 2011  . Neoplasm of skin of face    malignant  . OA (osteoarthritis)    knee  . Obesity,  unspecified   . Ostium secundum type atrial septal defect   . Other and unspecified hyperlipidemia   . Other malaise and fatigue   . Palpitations   . Shortness of breath   . Unspecified essential hypertension    pt states she does not have HTN  . Unspecified sinusitis (chronic)   . Unspecified vitamin D deficiency    Past Surgical History:  Procedure Laterality Date  . ABDOMINAL HYSTERECTOMY    . BREAST REDUCTION SURGERY  1990  . CERVICAL DISC SURGERY     Dr.Nudleman  . CHOLECYSTECTOMY    . COLONOSCOPY    . DILATION AND CURETTAGE OF UTERUS    . EYE SURGERY Bilateral 07/30/2017   cataract sx  . FLEXIBLE SIGMOIDOSCOPY  01/08/2012   Procedure: FLEXIBLE SIGMOIDOSCOPY;  Surgeon: Lafayette Dragon, MD;  Location: [facility];  Service: Endoscopy;  Laterality: N/A;  . HEMORRHOID SURGERY    . KNEE ARTHROTOMY     pt states no   . LAMINECTOMY N/A 01/31/2017   Procedure: Thoracic Laminectomy and marsupialization of arachnoid cyst;  Surgeon: Jovita Gamma, MD;  Location: [facility];  Service: Neurosurgery;  Laterality: N/A;  . LEFT OOPHORECTOMY  1976  . NECK SURGERY N/A 03/2018  . PARTIAL HYSTERECTOMY     right ovary remains  . REDUCTION MAMMAPLASTY    . TONSILLECTOMY  1972   Family History  Problem Relation Age of Onset  . Cirrhosis Father   . Heart disease Father   . Hypertension Mother   . Diabetes Mother   . Breast cancer Cousin   . Hypertension Sister   . Diabetes Sister   . Diabetes Cousin   . Diabetes Other        aunt  . Stroke Other        aunt  . Heart disease Brother        x 2  . Colon cancer Neg Hx    Social History   Socioeconomic History  . Marital status: Widowed    Spouse name: Not on file  . Number of children: 2  . Years of education: Not on file  . Highest education level: Not on file  Occupational History  . Occupation: retired  Tobacco Use  . 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 Smokeless tobacco: Never Used  Vaping Use  . Vaping Use: Never used   Substance and Sexual Activity  . Alcohol use: No  . Drug use: No  . Sexual activity: Yes  Other Topics Concern  . Not on file  Social History Narrative   Daily caffeine    Social Determinants of Health   Financial Resource Strain: Low Risk   . Difficulty of Paying Living Expenses: Not hard at all  Food Insecurity: No Food Insecurity  . Worried About Charity fundraiser in the Last Year: Never true  . Ran Out of Food in the Last Year: Never true  Transportation Needs: No Transportation Needs  . Lack of Transportation (Medical): No  . Lack of Transportation (Non-Medical): No  Physical Activity: Sufficiently Active  . Days of Exercise per Week: 7 days  . Minutes of Exercise per Session: 30 min  Stress: No Stress Concern Present  . Feeling of Stress : Not at all  Social Connections: Moderately Integrated  . Frequency of Communication with Friends and Family: More than three times a week  . Frequency of Social Gatherings with Friends and Family: More than three times a week  . Attends Religious Services: More than 4 times per year  . Active Member of Clubs or Organizations: Yes  . Attends Archivist Meetings: More than 4 times per year  . Marital Status: Widowed    Tobacco Counseling Counseling given: Not Answered   Clinical Intake:  Pre-visit preparation completed: Yes  Pain : No/denies pain     Nutritional Status: BMI > 30  Obese Nutritional Risks: None Diabetes: No  How often do you need to have someone help you when you read instructions, pamphlets, or other written materials from your doctor or pharmacy?: 1 - Never  Diabetic?No  Interpreter Needed?: No  Information entered by :: Caroleen Hamman LPN   Activities of Daily Living In your present state of health, do you have any difficulty performing the following activities: 03/08/2021  Hearing? N  Vision? N  Difficulty concentrating or making decisions? N  Walking or climbing stairs? N  Dressing  or bathing? N  Doing errands, shopping? N  Preparing Food and eating ? N  Using the Toilet? N  In the past six months, have you accidently leaked urine? N  Do you have problems with loss of bowel control? N  Managing your Medications? N  Managing your Finances? N  Housekeeping or managing your Housekeeping? N  Some recent data might be hidden    Patient Care Team: Ronnald Nian, DO as PCP - General (Family Medicine) Buford Dresser, MD as PCP - Cardiology (Cardiology) Earnie Larsson, MD as Consulting Physician (Neurosurgery)  Indicate any recent Medical Services you may have received from other than [facility] providers in the past year (date may be approximate).     Assessment:   This is a routine wellness examination for Nicole Daniels.  Hearing/Vision screen  Hearing Screening   125Hz  250Hz  500Hz  1000Hz  2000Hz  3000Hz  4000Hz  6000Hz  8000Hz   Right ear:           Left ear:           Comments: No issues  Vision Screening Comments: Last eye exam-2022-[facility]  Dietary issues and exercise activities discussed: Current Exercise Habits: Home exercise routine, Type of exercise: stretching;strength training/weights, Time (Minutes): 30, Frequency (Times/Week): 7, Weekly Exercise (Minutes/Week): 210, Intensity: Mild, Exercise limited by: None identified  Goals Addressed            This Visit's Progress   . Patient Stated       Eat healthier & increase activity      Depression Screen PHQ 2/9 Scores 03/08/2021 03/03/2020 02/26/2019 02/20/2018 11/21/2017 01/01/2017  PHQ - 2 Score 0 0 0 0 3 0  PHQ- 9 Score - - - - 4 -    Fall Risk Fall Risk  03/08/2021 03/03/2020 02/26/2019 02/20/2018 01/01/2017  Falls in the past year? 0 0 0 No Yes  Comment - - - - pt fell off porch  Number falls in past yr: 0 0 - - 1  Injury with Fall? 0 0 - - No  Follow up Falls prevention discussed Education provided;Falls prevention discussed - - -    FALL RISK PREVENTION PERTAINING TO THE HOME:  Any  stairs in or around the home? Yes  If so, are there  any without handrails? No  Home free of loose throw rugs in walkways, pet beds, electrical cords, etc? Yes  Adequate lighting in your home to reduce risk of falls? Yes   ASSISTIVE DEVICES UTILIZED TO PREVENT FALLS:  Life alert? No  Use of a cane, walker or w/c? No  Grab bars in the bathroom? No  Shower chair or bench in shower? No  Elevated toilet seat or a handicapped toilet? No   TIMED UP AND GO:  Was the test performed? No . Phone visit   Cognitive Function:Normal cognitive status assessed by  this Nurse Health Advisor. No abnormalities found.   MMSE - Mini Mental State Exam 02/20/2018 01/01/2017  Orientation to time 5 5  Orientation to Place 5 5  Registration 3 3  Attention/ Calculation 5 0  Recall 3 3  Language- name 2 objects 2 0  Language- repeat 1 1  Language- follow 3 step command 3 3  Language- read & follow direction 1 0  Write a sentence 1 0  Copy design 1 0  Total score 30 20        Immunizations Immunization History  Administered Date(s) Administered  . Influenza,inj,Quad PF,6+ Mos 09/01/2015  . PFIZER(Purple Top)SARS-COV-2 Vaccination 06/25/2020, 07/16/2020  . Td 10/30/1988, 08/25/2008    TDAP status: Due, Education has been provided regarding the importance of this vaccine. Advised may receive this vaccine at local pharmacy or Health Dept. Aware to provide a copy of the vaccination record if obtained from local pharmacy or Health Dept. Verbalized acceptance and understanding.  Flu Vaccine status: Declined, Education has been provided regarding the importance of this vaccine but patient still declined. Advised may receive this vaccine at local pharmacy or Health Dept. Aware to provide a copy of the vaccination record if obtained from local pharmacy or Health Dept. Verbalized acceptance and understanding.  Pneumococcal vaccine status: Declined,  Education has been provided regarding the importance of this  vaccine but patient still declined. Advised may receive this vaccine at local pharmacy or Health Dept. Aware to provide a copy of the vaccination record if obtained from local pharmacy or Health Dept. Verbalized acceptance and understanding.   Covid-19 vaccine status: Information provided on how to obtain vaccines. Booster due  Qualifies for Shingles Vaccine? Yes   Zostavax completed No   Shingrix Completed?: No.    Education has been provided regarding the importance of this vaccine. Patient has been advised to call insurance company to determine out of pocket expense if they have not yet received this vaccine. Advised may also receive vaccine at local pharmacy or Health Dept. Verbalized acceptance and understanding.  Screening Tests Health Maintenance  Topic Date Due  . TETANUS/TDAP  08/25/2018  . COLONOSCOPY (Pts 45-68yrs Insurance coverage will need to be confirmed)  01/14/2020  . COVID-19 Vaccine (3 - Pfizer risk 4-dose series) 08/13/2020  . INFLUENZA VACCINE  01/28/2024 (Originally 05/30/2021)  . PNA vac Low Risk Adult (1 of 2 - PCV13) 01/01/2026 (Originally 03/01/2015)  . MAMMOGRAM  08/30/2022  . DEXA SCAN  Completed  . Hepatitis C Screening  Completed  . HPV VACCINES  Aged Out    Health Maintenance  Health Maintenance Due  Topic Date Due  . TETANUS/TDAP  08/25/2018  . COLONOSCOPY (Pts 45-98yrs Insurance coverage will need to be confirmed)  01/14/2020  . COVID-19 Vaccine (3 - Pfizer risk 4-dose series) 08/13/2020   Colorectal cancer screening: Due- Declined today. Patient states she will think about it.  Mammogram status: Completed Bilateral  08/30/2020. Repeat every year  Bone Density status: Due-Declined today but patient states she will consider having done with her next mammogram.  Lung Cancer Screening: (Low Dose CT Chest recommended if Age 91-80 years, 30 pack-year currently smoking OR have quit w/in 15years.) does not qualify.    Additional Screening:  Hepatitis C  Screening:Completed 01/01/2017  Vision Screening: Recommended annual ophthalmology exams for early detection of glaucoma and other disorders of the eye. Is the patient up to date with their annual eye exam?  Yes  Who is the provider or what is the name of the office in which the patient attends annual eye exams? [place]   Dental Screening: Recommended annual dental exams for proper oral hygiene  Community Resource Referral / Chronic Care Management: CRR required this visit?  No   CCM required this visit?  No      Plan:     I have personally reviewed and noted the following in the patient's chart:   . Medical and social history . Use of alcohol, tobacco or illicit drugs  . Current medications and supplements including opioid prescriptions.  . Functional ability and status . Nutritional status . Physical activity . Advanced directives . List of other physicians . Hospitalizations, surgeries, and ER visits in previous 12 months . Vitals . Screenings to include cognitive, depression, and falls . Referrals and appointments  In addition, I have reviewed and discussed with patient certain preventive protocols, quality metrics, and best practice recommendations. A written personalized care plan for preventive services as well as general preventive health recommendations were provided to patient.   Due to this being a telephonic visit, the after visit summary with patients personalized plan was offered to patient via mail or my-chart. Patient would like to access on my-chart.     Marta Antu, LPN   8/92/1194  Nurse Health Advisor  Nurse Notes: None

## 2021-03-12 ENCOUNTER — Other Ambulatory Visit: Payer: Self-pay | Admitting: Family Medicine

## 2021-03-12 DIAGNOSIS — I10 Essential (primary) hypertension: Secondary | ICD-10-CM

## 2021-03-15 NOTE — Telephone Encounter (Signed)
Refill request for: HCTZ 25  Mg LR 07/01/20, #90, 3 rf  (Dr Buford Dresser) [place] 03/03/20 FOV 04/07/21  Please review and advise.  Thanks. Dm/cma

## 2021-03-23 ENCOUNTER — Other Ambulatory Visit: Payer: Self-pay | Admitting: Family Medicine

## 2021-03-23 DIAGNOSIS — R7989 Other specified abnormal findings of blood chemistry: Secondary | ICD-10-CM

## 2021-03-31 ENCOUNTER — Encounter: Payer: Medicare PPO | Admitting: Family Medicine

## 2021-04-07 ENCOUNTER — Encounter: Payer: Self-pay | Admitting: Family Medicine

## 2021-04-07 ENCOUNTER — Other Ambulatory Visit: Payer: Self-pay

## 2021-04-07 ENCOUNTER — Ambulatory Visit (INDEPENDENT_AMBULATORY_CARE_PROVIDER_SITE_OTHER): Payer: Medicare PPO | Admitting: Family Medicine

## 2021-04-07 VITALS — BP 128/80 | HR 70 | Temp 98.4°F | Ht 64.5 in | Wt 187.4 lb

## 2021-04-07 DIAGNOSIS — E039 Hypothyroidism, unspecified: Secondary | ICD-10-CM | POA: Diagnosis not present

## 2021-04-07 DIAGNOSIS — M8589 Other specified disorders of bone density and structure, multiple sites: Secondary | ICD-10-CM | POA: Diagnosis not present

## 2021-04-07 DIAGNOSIS — Z1211 Encounter for screening for malignant neoplasm of colon: Secondary | ICD-10-CM

## 2021-04-07 DIAGNOSIS — E782 Mixed hyperlipidemia: Secondary | ICD-10-CM | POA: Diagnosis not present

## 2021-04-07 DIAGNOSIS — E559 Vitamin D deficiency, unspecified: Secondary | ICD-10-CM

## 2021-04-07 DIAGNOSIS — Z2821 Immunization not carried out because of patient refusal: Secondary | ICD-10-CM | POA: Diagnosis not present

## 2021-04-07 DIAGNOSIS — I10 Essential (primary) hypertension: Secondary | ICD-10-CM

## 2021-04-07 DIAGNOSIS — R7989 Other specified abnormal findings of blood chemistry: Secondary | ICD-10-CM | POA: Diagnosis not present

## 2021-04-07 LAB — COMPREHENSIVE METABOLIC PANEL
ALT: 15 U/L (ref 0–35)
AST: 17 U/L (ref 0–37)
Albumin: 4.2 g/dL (ref 3.5–5.2)
Alkaline Phosphatase: 69 U/L (ref 39–117)
BUN: 14 mg/dL (ref 6–23)
CO2: 30 mEq/L (ref 19–32)
Calcium: 9.7 mg/dL (ref 8.4–10.5)
Chloride: 102 mEq/L (ref 96–112)
Creatinine, Ser: 0.82 mg/dL (ref 0.40–1.20)
GFR: 72.13 mL/min (ref >60.00)
Glucose, Bld: 101 mg/dL — ABNORMAL HIGH (ref 70–99)
Potassium: 3.6 mEq/L (ref 3.5–5.1)
Sodium: 142 mEq/L (ref 135–145)
Total Bilirubin: 0.8 mg/dL (ref 0.2–1.2)
Total Protein: 6.9 g/dL (ref 6.0–8.3)

## 2021-04-07 LAB — T4, FREE: Free T4: 0.8 ng/dL (ref 0.60–1.60)

## 2021-04-07 LAB — CBC
HCT: 42.9 % (ref 36.0–46.0)
Hemoglobin: 14.7 g/dL (ref 12.0–15.0)
MCHC: 34.2 g/dL (ref 30.0–36.0)
MCV: 90.3 fl (ref 78.0–100.0)
Platelets: 401 10*3/uL — ABNORMAL HIGH (ref 150.0–400.0)
RBC: 4.75 Mil/uL (ref 3.87–5.11)
RDW: 13.1 % (ref 11.5–15.5)
WBC: 5.4 10*3/uL (ref 4.0–10.5)

## 2021-04-07 LAB — LIPID PANEL
Cholesterol: 252 mg/dL — ABNORMAL HIGH (ref 0–200)
HDL: 50.7 mg/dL (ref >39.00)
NonHDL: 201.79
Total CHOL/HDL Ratio: 5
Triglycerides: 223 mg/dL — ABNORMAL HIGH (ref 0.0–149.0)
VLDL: 44.6 mg/dL — ABNORMAL HIGH (ref 0.0–40.0)

## 2021-04-07 LAB — LDL CHOLESTEROL, DIRECT: Direct LDL: 174 mg/dL

## 2021-04-07 LAB — VITAMIN D 25 HYDROXY (VIT D DEFICIENCY, FRACTURES): VITD: 39.22 ng/mL (ref 30.00–100.00)

## 2021-04-07 LAB — TSH: TSH: 4.31 u[IU]/mL (ref 0.35–4.50)

## 2021-04-07 MED ORDER — LEVOTHYROXINE SODIUM 25 MCG PO TABS: 25.0000 ug | ORAL_TABLET | Freq: Every day | ORAL | 3 refills | Status: DC

## 2021-04-07 MED ORDER — HYDROCHLOROTHIAZIDE 25 MG PO TABS: ORAL_TABLET | ORAL | 3 refills | Status: DC

## 2021-04-07 NOTE — Progress Notes (Signed)
Nicole Daniels is a 70 y.o. female  Chief Complaint  Patient presents with   Annual Exam    Pt fasting for labs.  Pt is due for colonoscopy and would like [place].  Pt informs not shots.  Pt would like to discuss medication, Hydrochlorothiazide 25mg .      HPI: Nicole Daniels is a 70 y.o. female patient seen today for routine f/u on chronic medical issues including HTN, HLD, Vit D deficiency, hypothyroidism. She is due for labs and also needs medication refills. For HTN pt is taking HCTZ 25mg  daily. For HLD pt is not on any medication. For hypothyroidism pt is taking levothyroxine 55mcg daily. Pt takes OTC Vit D 2000IU daily.  Last colonoscopy: flex sig in 2013 - Dr. Donna Christen w/ [facility]. Did FIT in 04/2018 - negative.  Last mammo: 08/2020 Last dexa: 06/2018 - osteopenia - T-score = -1.4  Immunizations: Shingrix - declines         Pneumonia - declines  Past Medical History:  Diagnosis Date   Abdominal pain, right lower quadrant    Acquired cyst of kidney 01/08/2012   pt denies history of    Acute sinusitis, unspecified    Adenomatous colon polyp    Angiomyolipoma    kidney   Chest pain    Chest pain, unspecified    Depressive disorder, not elsewhere classified 01/08/2012   not currently    Diverticulosis    Dizziness and giddiness    Dysfunction of eustachian tube    Hemorrhoids    History of diverticulitis of colon    IBS (irritable bowel syndrome)    Liver lesion 2011   Neoplasm of skin of face    malignant   OA (osteoarthritis)    knee   Obesity, unspecified    Ostium secundum type atrial septal defect    Other and unspecified hyperlipidemia    Other malaise and fatigue    Palpitations    Shortness of breath    Unspecified essential hypertension    pt states she does not have HTN   Unspecified sinusitis (chronic)    Unspecified vitamin D deficiency     Past Surgical History:  Procedure Laterality Date   ABDOMINAL HYSTERECTOMY     BREAST  REDUCTION SURGERY  1990   CERVICAL [place]   CHOLECYSTECTOMY     COLONOSCOPY     DILATION AND CURETTAGE OF UTERUS     EYE SURGERY Bilateral 07/30/2017   cataract sx   FLEXIBLE SIGMOIDOSCOPY  01/08/2012   Procedure: FLEXIBLE SIGMOIDOSCOPY;  Surgeon: Lafayette Dragon, MD;  Location: [facility];  Service: Endoscopy;  Laterality: N/A;   HEMORRHOID SURGERY     KNEE ARTHROTOMY     pt states no    LAMINECTOMY N/A 01/31/2017   Procedure: Thoracic Laminectomy and marsupialization of arachnoid cyst;  Surgeon: Jovita Gamma, MD;  Location: [facility];  Service: Neurosurgery;  Laterality: N/A;   LEFT OOPHORECTOMY  1976   NECK SURGERY N/A 03/2018   PARTIAL HYSTERECTOMY     right ovary remains   REDUCTION MAMMAPLASTY     TONSILLECTOMY  1972    Social History   Socioeconomic History   Marital status: Widowed    Spouse name: Not on file   Number of children: 2   Years of education: Not on file   Highest education level: Not on file  Occupational History   Occupation: retired  Tobacco Use   Smoking status: Never  Smokeless tobacco: Never  Vaping Use   Vaping Use: Never used  Substance and Sexual Activity   Alcohol use: No   Drug use: No   Sexual activity: Yes  Other Topics Concern   Not on file  Social History Narrative   Daily caffeine    Social Determinants of Health   Financial Resource Strain: Low Risk    Difficulty of Paying Living Expenses: Not hard at all  Food Insecurity: No Food Insecurity   Worried About Charity fundraiser in the Last Year: Never true   [place] in the Last Year: Never true  Transportation Needs: No Transportation Needs   Lack of Transportation (Medical): No   Lack of Transportation (Non-Medical): No  Physical Activity: Sufficiently Active   Days of Exercise per Week: 7 days   Minutes of Exercise per Session: 30 min  Stress: No Stress Concern Present   Feeling of Stress : Not at all  Social Connections: Moderately  Integrated   Frequency of Communication with Friends and Family: More than three times a week   Frequency of Social Gatherings with Friends and Family: More than three times a week   Attends Religious Services: More than 4 times per year   Active Member of [employer] or Organizations: Yes   Attends Archivist Meetings: More than 4 times per year   Marital Status: Widowed  Human resources officer Violence: Not At Risk   Fear of Current or Ex-Partner: No   Emotionally Abused: No   Physically Abused: No   Sexually Abused: No    Family History  Problem Relation Age of Onset   Cirrhosis Father    Heart disease Father    Hypertension Mother    Diabetes Mother    Breast cancer Cousin    Hypertension Sister    Diabetes Sister    Diabetes Cousin    Diabetes Other        aunt   Stroke Other        aunt   Heart disease Brother        x 2   Colon cancer Neg Hx      Immunization History  Administered Date(s) Administered   Influenza,inj,Quad PF,6+ Mos 09/01/2015   PFIZER(Purple Top)SARS-COV-2 Vaccination 06/25/2020, 07/16/2020   Td 10/30/1988, 08/25/2008    Outpatient Encounter Medications as of 04/07/2021  Medication Sig   Cholecalciferol (VITAMIN D3) 2000 units TABS Take 2,000 Units by mouth daily.   fluticasone (FLONASE) 50 MCG/ACT nasal spray Place 1 spray into both nostrils as needed for allergies.    hydrochlorothiazide (HYDRODIURIL) 25 MG tablet TAKE 1 TABLET BY MOUTH EVERYDAY AT BEDTIME   levothyroxine (SYNTHROID) 25 MCG tablet TAKE 1 TABLET BY MOUTH DAILY BEFORE BREAKFAST.   No facility-administered encounter medications on file as of 04/07/2021.     ROS: Gen: no fever, chills  Skin: no rash, itching ENT: no ear pain, ear drainage, nasal congestion, rhinorrhea, sinus pressure, sore throat Eyes: no blurry vision, double vision Resp: no cough, wheeze,SOB CV: no CP, palpitations, LE edema,  GI: no heartburn, n/v/d/c, abd pain GU: no dysuria, urgency, frequency, hematuria   MSK: no joint pain, myalgias, back pain Neuro: no dizziness, headache, weakness, vertigo Psych: no depression, anxiety, insomnia   Allergies  Allergen Reactions   Simvastatin Other (See Comments)    MYALGIAS   Statins Other (See Comments)    Has severe leg muscle aches and cramps   Bactrim [Sulfamethoxazole-Trimethoprim] Itching   Sertraline Anxiety  BP 128/80 (BP Location: Left Arm, Patient Position: Sitting, Cuff Size: Normal)   Pulse 70   Temp 98.4 F (36.9 C)   Ht 5' 4.5" (1.638 m)   Wt 187 lb 6.4 oz (85 kg)   SpO2 96%   BMI 31.67 kg/m  Wt Readings from Last 3 Encounters:  04/07/21 187 lb 6.4 oz (85 kg)  03/08/21 187 lb (84.8 kg)  10/05/20 187 lb (84.8 kg)   Temp Readings from Last 3 Encounters:  04/07/21 98.4 F (36.9 C)  05/20/20 98.2 F (36.8 C) (Oral)  03/03/20 97.6 F (36.4 C) (Oral)   BP Readings from Last 3 Encounters:  04/07/21 128/80  10/05/20 132/82  07/21/20 124/71   Pulse Readings from Last 3 Encounters:  04/07/21 70  10/05/20 74  07/21/20 61     Physical Exam Constitutional:      General: She is not in acute distress.    Appearance: She is well-developed.  HENT:     Head: Normocephalic and atraumatic.     Right Ear: Tympanic membrane and ear canal normal.     Left Ear: Tympanic membrane and ear canal normal.     Nose: Nose normal.     Mouth/Throat:     Mouth: Mucous membranes are moist.     Pharynx: Oropharynx is clear.  Eyes:     Conjunctiva/sclera: Conjunctivae normal.  Neck:     Thyroid: No thyromegaly.  Cardiovascular:     Rate and Rhythm: Normal rate and regular rhythm.     Heart sounds: Normal heart sounds. No murmur heard. Pulmonary:     Effort: Pulmonary effort is normal. No respiratory distress.     Breath sounds: Normal breath sounds. No wheezing or rhonchi.  Abdominal:     General: Bowel sounds are normal. There is no distension.     Palpations: Abdomen is soft. There is no mass.     Tenderness: There is no  abdominal tenderness.  Musculoskeletal:     Cervical back: Neck supple.     Right lower leg: No edema.     Left lower leg: No edema.  Lymphadenopathy:     Cervical: No cervical adenopathy.  Skin:    General: Skin is warm and dry.  Neurological:     Mental Status: She is alert and oriented to person, place, and time.     Motor: No abnormal muscle tone.     Coordination: Coordination normal.  Psychiatric:        Behavior: Behavior normal.     A/P:  1. Essential hypertension - controlled, at goal - cont HCTZ 25mg  daily - Comprehensive metabolic panel - CBC  2. Vitamin D deficiency - taking 2000IU daily - VITAMIN D 25 Hydroxy (Vit-D Deficiency, Fractures) - DG Bone Density; Future  3. Mixed hyperlipidemia - not on medication - Lipid panel  4. Hypothyroidism, unspecified type - taking levothyroxine 37mcg daily - TSH - T4, free  5. Osteopenia of multiple sites - taking Vit D 2000IU daily - DG Bone Density; Future  6. Pneumococcal vaccination declined by patient  7. Screening for colon cancer - Ambulatory referral to Gastroenterology    This visit occurred during the SARS-CoV-2 public health emergency.  Safety protocols were in place, including screening questions prior to the visit, additional usage of staff PPE, and extensive cleaning of exam room while observing appropriate contact time as indicated for disinfecting solutions.

## 2021-04-18 ENCOUNTER — Encounter: Payer: Self-pay | Admitting: Gastroenterology

## 2021-04-22 ENCOUNTER — Other Ambulatory Visit: Payer: Self-pay | Admitting: Family Medicine

## 2021-04-22 DIAGNOSIS — Z1231 Encounter for screening mammogram for malignant neoplasm of breast: Secondary | ICD-10-CM

## 2021-06-01 ENCOUNTER — Encounter: Payer: Self-pay | Admitting: Family Medicine

## 2021-06-01 ENCOUNTER — Other Ambulatory Visit: Payer: Self-pay | Admitting: Family Medicine

## 2021-06-01 DIAGNOSIS — M791 Myalgia, unspecified site: Secondary | ICD-10-CM

## 2021-06-01 DIAGNOSIS — T466X5A Adverse effect of antihyperlipidemic and antiarteriosclerotic drugs, initial encounter: Secondary | ICD-10-CM

## 2021-06-01 DIAGNOSIS — E782 Mixed hyperlipidemia: Secondary | ICD-10-CM

## 2021-06-01 MED ORDER — EZETIMIBE 10 MG PO TABS: 10.0000 mg | ORAL_TABLET | Freq: Every day | ORAL | 3 refills | Status: DC

## 2021-06-02 ENCOUNTER — Other Ambulatory Visit: Payer: Self-pay

## 2021-06-02 ENCOUNTER — Ambulatory Visit (AMBULATORY_SURGERY_CENTER): Payer: Medicare PPO | Admitting: *Deleted

## 2021-06-02 VITALS — Ht 64.5 in | Wt 185.0 lb

## 2021-06-02 DIAGNOSIS — Z86010 Personal history of colonic polyps: Secondary | ICD-10-CM

## 2021-06-02 MED ORDER — SUTAB 1479-225-188 MG PO TABS
1.0000 | ORAL_TABLET | Freq: Once | ORAL | 0 refills | Status: AC
Start: 2021-06-02 — End: 2021-06-02

## 2021-06-02 MED ORDER — NA SULFATE-K SULFATE-MG SULF 17.5-3.13-1.6 GM/177ML PO SOLN: 1.0000 | Freq: Once | ORAL | 0 refills | Status: AC

## 2021-06-02 NOTE — Progress Notes (Signed)
Pt's previsit is done over the phone and all paperwork (prep instructions, blank consent form to just read over) sent to patient.  Pt's name and DOB verified at the beginning of the previsit.  Pt denies any difficulty with ambulating.    No trouble with anesthesia, denies being told they were difficult to intubate, or hx/fam hx of malignant hyperthermia per pt   No egg or soy allergy  No home oxygen use   No medications for weight loss taken  Pt denies constipation issues  Pt informed that we do not do prior authorizations for prep  Pt requests pill vs fluids- Sutab rx sent in  Medicare Sutab coupon sent with paperwork

## 2021-06-07 ENCOUNTER — Telehealth: Payer: Self-pay | Admitting: Gastroenterology

## 2021-06-07 NOTE — Telephone Encounter (Signed)
Patient called states she was told there would be a coupon available for the prep medication but when she went to pick it up no coupon was presented.

## 2021-06-07 NOTE — Telephone Encounter (Signed)
Medicare Sutab coupon was sent in the mail with pt's paperwork.  She hasn't received the paperwork yet.  She will let us know if the paper work doesn't come in the next few days

## 2021-06-15 ENCOUNTER — Encounter: Payer: Self-pay | Admitting: Certified Registered Nurse Anesthetist

## 2021-06-16 ENCOUNTER — Ambulatory Visit (AMBULATORY_SURGERY_CENTER): Payer: Medicare PPO | Admitting: Gastroenterology

## 2021-06-16 ENCOUNTER — Other Ambulatory Visit: Payer: Self-pay

## 2021-06-16 ENCOUNTER — Encounter: Payer: Self-pay | Admitting: Gastroenterology

## 2021-06-16 VITALS — BP 127/65 | HR 64 | Temp 97.7°F | Resp 18 | Ht 64.5 in | Wt 185.0 lb

## 2021-06-16 DIAGNOSIS — D122 Benign neoplasm of ascending colon: Secondary | ICD-10-CM | POA: Diagnosis not present

## 2021-06-16 DIAGNOSIS — D120 Benign neoplasm of cecum: Secondary | ICD-10-CM

## 2021-06-16 DIAGNOSIS — Z86010 Personal history of colonic polyps: Secondary | ICD-10-CM | POA: Diagnosis not present

## 2021-06-16 DIAGNOSIS — D124 Benign neoplasm of descending colon: Secondary | ICD-10-CM | POA: Diagnosis not present

## 2021-06-16 DIAGNOSIS — D214 Benign neoplasm of connective and other soft tissue of abdomen: Secondary | ICD-10-CM | POA: Diagnosis not present

## 2021-06-16 DIAGNOSIS — D125 Benign neoplasm of sigmoid colon: Secondary | ICD-10-CM | POA: Diagnosis not present

## 2021-06-16 DIAGNOSIS — E079 Disorder of thyroid, unspecified: Secondary | ICD-10-CM | POA: Diagnosis not present

## 2021-06-16 MED ORDER — SODIUM CHLORIDE 0.9 % IV SOLN: 500.0000 mL | Freq: Once | INTRAVENOUS | Status: DC

## 2021-06-16 NOTE — Progress Notes (Signed)
Referring Provider: Ronnald Nian, DO Primary Care Physician:  Ronnald Nian, DO  Reason for Consultation:  Colonoscopy   IMPRESSION:  Surveillance colonoscopy due for a history of colon polyps  PLAN: Colonoscopy  Please see the "Patient Instructions" section for addition details about the plan.  HPI: Nicole Daniels is a 70 y.o. female returns for surveillance colonoscopy.   History of tubular adenoma on colonoscopy 01/03/10. Surveillance colonoscopy recommended in 5 years.   Last endoscopy. Flex sig for hemorrhoids 2013.  No known family history of colon cancer or polyps. No family history of uterine/endometrial cancer, pancreatic cancer or gastric/stomach cancer.   Past Medical History:  Diagnosis Date   Abdominal pain, right lower quadrant    Acquired cyst of kidney 01/08/2012   pt denies history of    Acute sinusitis, unspecified    Adenomatous colon polyp    Allergy    Angiomyolipoma    kidney   Anxiety    OCCASIONAL   Chest pain    Chest pain, unspecified    Depressive disorder, not elsewhere classified 01/08/2012   not currently    Diverticulosis    Dizziness and giddiness    Dysfunction of eustachian tube    Hemorrhoids    History of diverticulitis of colon    IBS (irritable bowel syndrome)    Liver lesion 2011   Neoplasm of skin of face    malignant   OA (osteoarthritis)    knee, NECK   Obesity, unspecified    Ostium secundum type atrial septal defect    Other and unspecified hyperlipidemia    Other malaise and fatigue    Palpitations    Shortness of breath    Thyroid disease    Unspecified essential hypertension    pt states she does not have HTN   Unspecified sinusitis (chronic)    Unspecified vitamin D deficiency     Past Surgical History:  Procedure Laterality Date   ABDOMINAL HYSTERECTOMY     BREAST REDUCTION SURGERY  1990   CERVICAL [place]   CHOLECYSTECTOMY     COLONOSCOPY     DILATION AND  CURETTAGE OF UTERUS     EYE SURGERY Bilateral 07/30/2017   cataract sx   FLEXIBLE SIGMOIDOSCOPY  01/08/2012   Procedure: FLEXIBLE SIGMOIDOSCOPY;  Surgeon: Lafayette Dragon, MD;  Location: [facility];  Service: Endoscopy;  Laterality: N/A;   HEMORRHOID SURGERY     KNEE ARTHROTOMY     pt states no    LAMINECTOMY N/A 01/31/2017   Procedure: Thoracic Laminectomy and marsupialization of arachnoid cyst;  Surgeon: Jovita Gamma, MD;  Location: [facility];  Service: Neurosurgery;  Laterality: N/A;   LEFT OOPHORECTOMY  1976   NECK SURGERY N/A 03/2018   PARTIAL HYSTERECTOMY     right ovary remains   REDUCTION MAMMAPLASTY     TONSILLECTOMY  1972   UPPER GASTROINTESTINAL ENDOSCOPY      Current Outpatient Medications  Medication Sig Dispense Refill   Cholecalciferol (VITAMIN D3) 2000 units TABS Take 2,000 Units by mouth daily.     ezetimibe (ZETIA) 10 MG tablet Take 1 tablet (10 mg total) by mouth daily. 90 tablet 3   hydrochlorothiazide (HYDRODIURIL) 25 MG tablet TAKE 1 TABLET BY MOUTH EVERYDAY AT BEDTIME (Patient taking differently: TAKE 1 TABLET BY MOUTH EVERYDAY IN THE AM) 90 tablet 3   levothyroxine (SYNTHROID) 25 MCG tablet Take 1 tablet (25 mcg total) by mouth daily before breakfast. 90 tablet  3   fluticasone (FLONASE) 50 MCG/ACT nasal spray Place 1 spray into both nostrils as needed for allergies.      Current Facility-Administered Medications  Medication Dose Route Frequency Provider Last Rate Last Admin   0.9 %  sodium chloride infusion  500 mL Intravenous Once Thornton Park, MD        Allergies as of 06/16/2021 - Review Complete 06/16/2021  Allergen Reaction Noted   Simvastatin Other (See Comments) 01/27/2011   Statins Other (See Comments) 05/14/2017   Bactrim [sulfamethoxazole-trimethoprim] Itching 11/12/2013   Sertraline Anxiety 04/10/2019    Family History  Problem Relation Age of Onset   Hypertension Mother    Diabetes Mother    Cirrhosis Father    Heart disease Father     Hypertension Sister    Diabetes Sister    Heart disease Brother        x 2   Breast cancer Cousin    Diabetes Cousin    Diabetes Other        aunt   Stroke Other        aunt   Colon cancer Neg Hx    Esophageal cancer Neg Hx    Stomach cancer Neg Hx    Rectal cancer Neg Hx      Physical Exam: General:   Alert,  well-nourished, pleasant and cooperative in NAD Head:  Normocephalic and atraumatic. Eyes:  Sclera clear, no icterus.   Conjunctiva pink. Ears:  Normal auditory acuity. Nose:  No deformity, discharge,  or lesions. Mouth:  No deformity or lesions.   Neck:  Supple; no masses or thyromegaly. Lungs:  Clear throughout to auscultation.   No wheezes. Heart:  Regular rate and rhythm; no murmurs. Abdomen:  Soft,nontender, nondistended, normal bowel sounds, no rebound or guarding. No hepatosplenomegaly.   Rectal:  Deferred  Msk:  Symmetrical. No boney deformities LAD: No inguinal or umbilical LAD Extremities:  No clubbing or edema. Neurologic:  Alert and  oriented x4;  grossly nonfocal Skin:  Intact without significant lesions or rashes. Psych:  Alert and cooperative. Normal mood and affect.      Casy Brunetto L. Tarri Glenn, MD, MPH 06/16/2021, 11:25 AM

## 2021-06-16 NOTE — Progress Notes (Signed)
Report given to PACU, vss 

## 2021-06-16 NOTE — Patient Instructions (Signed)
YOU HAD AN ENDOSCOPIC PROCEDURE TODAY AT THE [facility]:   Refer to the procedure report that was given to you for any specific questions about what was found during the examination.  If the procedure report does not answer your questions, please call your gastroenterologist to clarify.  If you requested that your care partner not be given the details of your procedure findings, then the procedure report has been included in a sealed envelope for you to review at your convenience later.  YOU SHOULD EXPECT: Some feelings of bloating in the abdomen. Passage of more gas than usual.  Walking can help get rid of the air that was put into your GI tract during the procedure and reduce the bloating. If you had a lower endoscopy (such as a colonoscopy or flexible sigmoidoscopy) you may notice spotting of blood in your stool or on the toilet paper. If you underwent a bowel prep for your procedure, you may not have a normal bowel movement for a few days.  Please Note:  You might notice some irritation and congestion in your nose or some drainage.  This is from the oxygen used during your procedure.  There is no need for concern and it should clear up in a day or so.  SYMPTOMS TO REPORT IMMEDIATELY:   Following lower endoscopy (colonoscopy or flexible sigmoidoscopy):  Excessive amounts of blood in the stool  Significant tenderness or worsening of abdominal pains  Swelling of the abdomen that is new, acute  Fever of 100F or higher  For urgent or emergent issues, a gastroenterologist can be reached at any hour by calling [phone]. Do not use MyChart messaging for urgent concerns.    DIET:  We do recommend a small meal at first, but then you may proceed to your regular diet.  Drink plenty of fluids but you should avoid alcoholic beverages for 24 hours.  ACTIVITY:  You should plan to take it easy for the rest of today and you should NOT DRIVE or use heavy machinery until tomorrow (because  of the sedation medicines used during the test).    FOLLOW UP: Our staff will call the number listed on your records 48-72 hours following your procedure to check on you and address any questions or concerns that you may have regarding the information given to you following your procedure. If we do not reach you, we will leave a message.  We will attempt to reach you two times.  During this call, we will ask if you have developed any symptoms of COVID 19. If you develop any symptoms (ie: fever, flu-like symptoms, shortness of breath, cough etc.) before then, please call [phone].  If you test positive for Covid 19 in the 2 weeks post procedure, please call and report this information to us.    If any biopsies were taken you will be contacted by phone or by letter within the next 1-3 weeks.  Please call us at [phone] if you have not heard about the biopsies in 3 weeks.    SIGNATURES/CONFIDENTIALITY: You and/or your care partner have signed paperwork which will be entered into your electronic medical record.  These signatures attest to the fact that that the information above on your After Visit Summary has been reviewed and is understood.  Full responsibility of the confidentiality of this discharge information lies with you and/or your care-partner. 

## 2021-06-16 NOTE — Op Note (Signed)
[place] Patient Name: Nicole Daniels Procedure Date: 06/16/2021 11:19 AM MRN: [id number] Endoscopist: Thornton Park MD, MD Age: 70 Referring MD:  Date of Birth: 1951/03/31 Gender: Female Account #: [id number] Procedure:                Colonoscopy Indications:              Surveillance: Personal history of adenomatous                            polyps on last colonoscopy > 5 years ago                           History of tubular adenoma on colonoscopy 01/03/10.                            Surveillance colonoscopy recommended in 5 years.                           Last endoscopy with flex sig for hemorrhoids 2013. Medicines:                Monitored Anesthesia Care Procedure:                Pre-Anesthesia Assessment:                           - Prior to the procedure, a History and Physical                            was performed, and patient medications and                            allergies were reviewed. The patient's tolerance of                            previous anesthesia was also reviewed. The risks                            and benefits of the procedure and the sedation                            options and risks were discussed with the patient.                            All questions were answered, and informed consent                            was obtained. Prior Anticoagulants: The patient has                            taken no previous anticoagulant or antiplatelet                            agents. ASA Grade Assessment: II - A patient with  mild systemic disease. After reviewing the risks                            and benefits, the patient was deemed in                            satisfactory condition to undergo the procedure.                           After obtaining informed consent, the colonoscope                            was passed under direct vision. Throughout the                            procedure, the  patient's blood pressure, pulse, and                            oxygen saturations were monitored continuously. The                            CF [id number] was introduced through the anus                            and advanced to the 3 cm into the ileum. A second                            forward view of the right colon was performed. The                            colonoscopy was performed without difficulty. The                            patient tolerated the procedure well. The quality                            of the bowel preparation was excellent. The                            terminal ileum, ileocecal valve, appendiceal                            orifice, and rectum were photographed. Scope In: 11:34:58 AM Scope Out: 11:53:52 AM Scope Withdrawal Time: 0 hours 15 minutes 57 seconds  Total Procedure Duration: 0 hours 18 minutes 54 seconds  Findings:                 Hemorrhoids were found on perianal exam.                           Non-bleeding internal hemorrhoids were found.                           Multiple small and large-mouthed diverticula were  found in the sigmoid colon and descending colon.                           A less than 1 mm polyp was found in the descending                            colon. The polyp was flat but located behind a                            fold. The polyp was removed with a cold biopsy                            forceps after failed resection with a cold snare.                            Resection and retrieval were complete. Estimated                            blood loss was minimal.                           Three sessile polyps were found in the ascending                            colon. The polyps were 2 to 3 mm in size. These                            polyps were removed with a cold snare. Resection                            and retrieval were complete. Estimated blood loss                            was  minimal.                           Two sessile polyps were found in the cecum. The                            polyps were 1 to 2 mm in size. These polyps were                            removed with a cold snare. Resection and retrieval                            were complete. Estimated blood loss was minimal.                           The exam was otherwise without abnormality on                            direct and retroflexion views. Complications:  No immediate complications. Estimated blood loss:                            Minimal. Estimated Blood Loss:     Estimated blood loss was minimal. Impression:               - Hemorrhoids found on perianal exam.                           - Non-bleeding internal hemorrhoids.                           - Diverticulosis in the sigmoid colon and in the                            descending colon.                           - One less than 1 mm polyp in the descending colon,                            removed with a cold biopsy forceps. Resected and                            retrieved.                           - Three 2 to 3 mm polyps in the ascending colon,                            removed with a cold snare. Resected and retrieved.                           - Two 1 to 2 mm polyps in the cecum, removed with a                            cold snare. Resected and retrieved.                           - The examination was otherwise normal on direct                            and retroflexion views. Recommendation:           - Patient has a contact number available for                            emergencies. The signs and symptoms of potential                            delayed complications were discussed with the                            patient. Return to normal activities tomorrow.  Written discharge instructions were provided to the                            patient.                           - High  fiber diet.                           - Continue present medications.                           - Await pathology results.                           - Repeat colonoscopy in 3 years for surveillance.                           - Emerging evidence supports eating a diet of                            fruits, vegetables, grains, calcium, and yogurt                            while reducing red meat and alcohol may reduce the                            risk of colon cancer. Thornton Park MD, MD 06/16/2021 12:02:12 PM This report has been signed electronically.

## 2021-06-16 NOTE — Progress Notes (Signed)
Pt's states no medical or surgical changes since previsit or office visit. 

## 2021-06-16 NOTE — Progress Notes (Signed)
Called to room to assist during endoscopic procedure.  Patient ID and intended procedure confirmed with present staff. Received instructions for my participation in the procedure from the performing physician.  

## 2021-06-20 ENCOUNTER — Telehealth: Payer: Self-pay

## 2021-06-20 NOTE — Telephone Encounter (Signed)
  Follow up Call-  Call back number 06/16/2021  Post procedure Call Back phone  # [phone]  Permission to leave phone message Yes  Some recent data might be hidden     Patient questions:  Do you have a fever, pain , or abdominal swelling? No. Pain Score  0 *  Have you tolerated food without any problems? Yes.    Have you been able to return to your normal activities? Yes.    Do you have any questions about your discharge instructions: Diet   No. Medications  No. Follow up visit  No.  Do you have questions or concerns about your Care? No.  Actions: * If pain score is 4 or above: No action needed, pain <4.  Have you developed a fever since your procedure? no  2.   Have you had an respiratory symptoms (SOB or cough) since your procedure? no  3.   Have you tested positive for COVID 19 since your procedure no  4.   Have you had any family members/close contacts diagnosed with the COVID 19 since your procedure?  no  RN

## 2021-06-21 ENCOUNTER — Encounter: Payer: Self-pay | Admitting: Gastroenterology

## 2021-07-05 ENCOUNTER — Other Ambulatory Visit: Payer: Self-pay

## 2021-07-05 ENCOUNTER — Encounter (HOSPITAL_BASED_OUTPATIENT_CLINIC_OR_DEPARTMENT_OTHER): Payer: Self-pay | Admitting: Emergency Medicine

## 2021-07-05 ENCOUNTER — Emergency Department (HOSPITAL_BASED_OUTPATIENT_CLINIC_OR_DEPARTMENT_OTHER)
Admission: EM | Admit: 2021-07-05 | Discharge: 2021-07-05 | Disposition: A | Discharge status: Home / Self Care | Payer: Medicare PPO | Attending: Emergency Medicine | Admitting: Emergency Medicine

## 2021-07-05 ENCOUNTER — Emergency Department (HOSPITAL_BASED_OUTPATIENT_CLINIC_OR_DEPARTMENT_OTHER): Payer: Medicare PPO

## 2021-07-05 DIAGNOSIS — H9201 Otalgia, right ear: Secondary | ICD-10-CM | POA: Diagnosis not present

## 2021-07-05 DIAGNOSIS — Z85828 Personal history of other malignant neoplasm of skin: Secondary | ICD-10-CM | POA: Diagnosis not present

## 2021-07-05 DIAGNOSIS — R519 Headache, unspecified: Secondary | ICD-10-CM | POA: Diagnosis not present

## 2021-07-05 DIAGNOSIS — G44209 Tension-type headache, unspecified, not intractable: Secondary | ICD-10-CM | POA: Diagnosis not present

## 2021-07-05 DIAGNOSIS — Z79899 Other long term (current) drug therapy: Secondary | ICD-10-CM | POA: Insufficient documentation

## 2021-07-05 DIAGNOSIS — I10 Essential (primary) hypertension: Secondary | ICD-10-CM | POA: Diagnosis not present

## 2021-07-05 MED ORDER — METHOCARBAMOL 500 MG PO TABS: 500.0000 mg | ORAL_TABLET | Freq: Two times a day (BID) | ORAL | 0 refills | Status: DC

## 2021-07-05 MED ORDER — HYDROCODONE-ACETAMINOPHEN 5-325 MG PO TABS: 1.0000 | ORAL_TABLET | ORAL | 0 refills | Status: DC | PRN

## 2021-07-05 MED ORDER — METOCLOPRAMIDE HCL 5 MG/ML IJ SOLN
10.0000 mg | Freq: Once | INTRAMUSCULAR | Status: AC
  Administered 2021-07-05: 10 mg via INTRAMUSCULAR
  Filled 2021-07-05: qty 2

## 2021-07-05 MED ORDER — IBUPROFEN 400 MG PO TABS: 400.0000 mg | ORAL_TABLET | Freq: Four times a day (QID) | ORAL | 0 refills | Status: DC | PRN

## 2021-07-05 MED ORDER — KETOROLAC TROMETHAMINE 60 MG/2ML IM SOLN
30.0000 mg | Freq: Once | INTRAMUSCULAR | Status: AC
  Administered 2021-07-05: 30 mg via INTRAMUSCULAR
  Filled 2021-07-05: qty 2

## 2021-07-05 NOTE — ED Triage Notes (Signed)
PT to ER reports headache to "back of head" for over a week.  Pt reports minor relief with OTC meds.  Pt also reports decrease in hearing and drainage from right ear. Pt states she is also having sinus congestion.

## 2021-07-05 NOTE — ED Provider Notes (Signed)
[facility] EMERGENCY DEPT Provider Note   CSN: [id number] Arrival date & time: 07/05/21  1449     History Chief Complaint  Patient presents with   Headache    Nicole Daniels is a 70 y.o. female.  70 year old female presents with 1 week history of headache that starts in her occipital region.  It has not been associated with visual changes.  No focal weakness.  No fever or chills.  States that she is use over-the-counter medication with temporary relief.  Has had some sinus pressure, right ear.  Denies any ataxia.  Pain does get worse with certain movements of her neck.  Has any rashes        Past Medical History:  Diagnosis Date   Abdominal pain, right lower quadrant    Acquired cyst of kidney 01/08/2012   pt denies history of    Acute sinusitis, unspecified    Adenomatous colon polyp    Allergy    Angiomyolipoma    kidney   Anxiety    OCCASIONAL   Chest pain    Chest pain, unspecified    Depressive disorder, not elsewhere classified 01/08/2012   not currently    Diverticulosis    Dizziness and giddiness    Dysfunction of eustachian tube    Hemorrhoids    History of diverticulitis of colon    IBS (irritable bowel syndrome)    Liver lesion 2011   Neoplasm of skin of face    malignant   OA (osteoarthritis)    knee, NECK   Obesity, unspecified    Ostium secundum type atrial septal defect    Other and unspecified hyperlipidemia    Other malaise and fatigue    Palpitations    Shortness of breath    Thyroid disease    Unspecified essential hypertension    pt states she does not have HTN   Unspecified sinusitis (chronic)    Unspecified vitamin D deficiency     Patient Active Problem List   Diagnosis Date Noted   Cardiac symptoms with risk for coronary heart disease between 10% and 20% in next 10 years 07/01/2020   Bilateral leg edema 09/11/2019   Elevated TSH 09/11/2019   Anxiety 04/10/2019   GERD (gastroesophageal reflux disease)  [id number]   Complicated grief A999333   Hypokalemia 05/14/2017   Intradural arachnoid cyst of spine 01/31/2017   Allergic reaction 11/12/2013   Essential hypertension 12/06/2010   MIGRAINE, CHRONIC 11/24/2010   Vitamin D deficiency 04/08/2010   DEPRESSION, MILD 03/09/2010   RENAL CYST 12/22/2009   Acute sinusitis 09/20/2009   Obesity (BMI 30.0-34.9) 08/11/2008   NEOPLASM, MALIGNANT, SKIN, FACE 12/01/2007   PATENT FORAMEN OVALE 01/28/2005   Mixed hyperlipidemia 03/30/2001    Past Surgical History:  Procedure Laterality Date   ABDOMINAL HYSTERECTOMY     BREAST REDUCTION SURGERY  1990   CERVICAL [place]   CHOLECYSTECTOMY     COLONOSCOPY     DILATION AND CURETTAGE OF UTERUS     EYE SURGERY Bilateral 07/30/2017   cataract sx   FLEXIBLE SIGMOIDOSCOPY  01/08/2012   Procedure: FLEXIBLE SIGMOIDOSCOPY;  Surgeon: Lafayette Dragon, MD;  Location: [facility];  Service: Endoscopy;  Laterality: N/A;   HEMORRHOID SURGERY     KNEE ARTHROTOMY     pt states no    LAMINECTOMY N/A 01/31/2017   Procedure: Thoracic Laminectomy and marsupialization of arachnoid cyst;  Surgeon: Jovita Gamma, MD;  Location: [facility];  Service: Neurosurgery;  Laterality: N/A;   LEFT OOPHORECTOMY  1976   NECK SURGERY N/A 03/2018   PARTIAL HYSTERECTOMY     right ovary remains   REDUCTION MAMMAPLASTY     TONSILLECTOMY  1972   UPPER GASTROINTESTINAL ENDOSCOPY       OB History   No obstetric history on file.     Family History  Problem Relation Age of Onset   Hypertension Mother    Diabetes Mother    Cirrhosis Father    Heart disease Father    Hypertension Sister    Diabetes Sister    Heart disease Brother        x 2   Breast cancer Cousin    Diabetes Cousin    Diabetes Other        aunt   Stroke Other        aunt   Colon cancer Neg Hx    Esophageal cancer Neg Hx    Stomach cancer Neg Hx    Rectal cancer Neg Hx     Social History   Tobacco Use   Smoking status: Never    Smokeless tobacco: Never  Vaping Use   Vaping Use: Never used  Substance Use Topics   Alcohol use: No   Drug use: No    Home Medications Prior to Admission medications   Medication Sig Start Date End Date Taking? Authorizing Provider  Cholecalciferol (VITAMIN D3) 2000 units TABS Take 2,000 Units by mouth daily.    [provider]  ezetimibe (ZETIA) 10 MG tablet Take 1 tablet (10 mg total) by mouth daily. 06/01/21   Cirigliano, Mary K, DO  fluticasone (FLONASE) 50 MCG/ACT nasal spray Place 1 spray into both nostrils as needed for allergies.     [provider]  hydrochlorothiazide (HYDRODIURIL) 25 MG tablet TAKE 1 TABLET BY MOUTH EVERYDAY AT BEDTIME Patient taking differently: TAKE 1 TABLET BY MOUTH EVERYDAY IN THE AM 04/07/21   Cirigliano, Garvin Fila, DO  levothyroxine (SYNTHROID) 25 MCG tablet Take 1 tablet (25 mcg total) by mouth daily before breakfast. 04/07/21   Cirigliano, Garvin Fila, DO    Allergies    Simvastatin, Statins, Bactrim [sulfamethoxazole-trimethoprim], and Sertraline  Review of Systems   Review of Systems  All other systems reviewed and are negative.  Physical Exam Updated Vital Signs BP (!) 172/83 (BP Location: Right Arm)   Pulse 86   Temp 98.5 F (36.9 C) (Oral)   Resp 18   Ht 1.676 m ('5\' 6"'$ )   Wt 86 kg   SpO2 95%   BMI 30.60 kg/m   Physical Exam Vitals and nursing note reviewed.  Constitutional:      General: She is not in acute distress.    Appearance: Normal appearance. She is well-developed. She is not toxic-appearing.  HENT:     Head: Normocephalic and atraumatic.  Eyes:     General: Lids are normal.     Conjunctiva/sclera: Conjunctivae normal.     Pupils: Pupils are equal, round, and reactive to light.  Neck:     Thyroid: No thyroid mass.     Trachea: No tracheal deviation.     Comments: Tenderness to palpation at cervical spinal muscles Cardiovascular:     Rate and Rhythm: Normal rate and regular rhythm.     Heart sounds:  Normal heart sounds. No murmur heard.   No gallop.  Pulmonary:     Effort: Pulmonary effort is normal. No respiratory distress.     Breath sounds: Normal breath sounds.  No stridor. No decreased breath sounds, wheezing, rhonchi or rales.  Abdominal:     General: There is no distension.     Palpations: Abdomen is soft.     Tenderness: There is no abdominal tenderness. There is no rebound.  Musculoskeletal:        General: No tenderness. Normal range of motion.     Cervical back: Normal range of motion and neck supple.  Skin:    General: Skin is warm and dry.     Findings: No abrasion or rash.  Neurological:     General: No focal deficit present.     Mental Status: She is alert and oriented to person, place, and time. Mental status is at baseline.     GCS: GCS eye subscore is 4. GCS verbal subscore is 5. GCS motor subscore is 6.     Cranial Nerves: Cranial nerves are intact. No cranial nerve deficit.     Sensory: No sensory deficit.     Motor: Motor function is intact.  Psychiatric:        Attention and Perception: Attention normal.        Speech: Speech normal.        Behavior: Behavior normal.    ED Results / Procedures / Treatments   Labs (all labs ordered are listed, but only abnormal results are displayed) Labs Reviewed - No data to display  EKG None  Radiology No results found.  Procedures Procedures   Medications Ordered in ED Medications - No data to display  ED Course  I have reviewed the triage vital signs and the nursing notes.  Pertinent labs & imaging results that were available during my care of the patient were reviewed by me and considered in my medical decision making (see chart for details).    MDM Rules/Calculators/A&P                           Head CT here is negative.  Patient with reproducible muscle pain at the base of her skull.  It radiates down both paraspinal muscles.  She does not have any meningismus signs.  No focal neurological  deficits.  Suspect muscle strain and will place on anti-inflammatories and muscle relaxants as well as short course of opiates.  Final Clinical Impression(s) / ED Diagnoses Final diagnoses:  None    Rx / DC Orders ED Discharge Orders     None        Lacretia Leigh, MD 07/05/21 [phone]

## 2021-07-10 ENCOUNTER — Other Ambulatory Visit: Payer: Self-pay

## 2021-07-10 ENCOUNTER — Emergency Department (HOSPITAL_BASED_OUTPATIENT_CLINIC_OR_DEPARTMENT_OTHER)
Admission: EM | Admit: 2021-07-10 | Discharge: 2021-07-10 | Disposition: A | Discharge status: Home / Self Care | Payer: Medicare PPO | Attending: Emergency Medicine | Admitting: Emergency Medicine

## 2021-07-10 ENCOUNTER — Encounter (HOSPITAL_BASED_OUTPATIENT_CLINIC_OR_DEPARTMENT_OTHER): Payer: Self-pay | Admitting: Emergency Medicine

## 2021-07-10 DIAGNOSIS — R519 Headache, unspecified: Secondary | ICD-10-CM

## 2021-07-10 DIAGNOSIS — I10 Essential (primary) hypertension: Secondary | ICD-10-CM | POA: Diagnosis not present

## 2021-07-10 DIAGNOSIS — Z85828 Personal history of other malignant neoplasm of skin: Secondary | ICD-10-CM | POA: Diagnosis not present

## 2021-07-10 DIAGNOSIS — J019 Acute sinusitis, unspecified: Secondary | ICD-10-CM | POA: Insufficient documentation

## 2021-07-10 DIAGNOSIS — Z79899 Other long term (current) drug therapy: Secondary | ICD-10-CM | POA: Diagnosis not present

## 2021-07-10 MED ORDER — DOXYCYCLINE HYCLATE 100 MG PO CAPS: 100.0000 mg | ORAL_CAPSULE | Freq: Two times a day (BID) | ORAL | 0 refills | Status: DC

## 2021-07-10 MED ORDER — FLUTICASONE PROPIONATE 50 MCG/ACT NA SUSP: 1.0000 | Freq: Every day | NASAL | 0 refills | Status: DC

## 2021-07-10 NOTE — Discharge Instructions (Addendum)
Please pick up antibiotic and take as prescribed. Use the Flonase as directed to help with fluid in your sinuses.   Follow up with your PCP regarding ED visit today  Return to the ED for any new/worsening symptoms including worsening headache, vision changes, speech changes, confusion, unilateral weakness or numbness, passing out, or any other new/concerning symptoms.

## 2021-07-10 NOTE — ED Provider Notes (Signed)
[facility] EMERGENCY DEPT Provider Note   CSN: [id number] Arrival date & time: 07/10/21  1211     History Chief Complaint  Patient presents with   Headache    Nicole Daniels is a 70 y.o. female who presents to the ED today with complaint of gradual onset, constant, achy/throbbing/fullness, left sided headache that began about 2 weeks ago.  Also complaining of a pressure sensation to her ear.  She feels like she is sitting in a vacuum.  She also has worsening pressure in her head with bending.  Patient reports that she had been attempting to take over-the-counter medications without relief.  She came to the ED on 09/06 for further evaluation.  She had a negative CT scan at that time.  She was treated with Reglan and Toradol and discharged home with muscle relaxer, ibuprofen, hydrocodone as it was believed that her pain was likely from muscle tension in her neck.  She states she has been taking all these medications without any relief.  She states she continues to have a headache prompting ED visit today.  She denies any blurry vision, double vision, speech changes, unilateral weakness or numbness, confusion.  She states she has never dealt with a headache like this.  She states she did have sweats last night however did not feel feverish or chilly.  She did not check her temperature.  She denies any recent sick contacts.  She denies any associated symptoms including sore throat, cough, body aches.  She states she typically has sneezing occasionally and will have sinus drainage, no worse than normal for her.   The history is provided by the patient, a friend and medical records.      Past Medical History:  Diagnosis Date   Abdominal pain, right lower quadrant    Acquired cyst of kidney 01/08/2012   pt denies history of    Acute sinusitis, unspecified    Adenomatous colon polyp    Allergy    Angiomyolipoma    kidney   Anxiety    OCCASIONAL   Chest pain    Chest  pain, unspecified    Depressive disorder, not elsewhere classified 01/08/2012   not currently    Diverticulosis    Dizziness and giddiness    Dysfunction of eustachian tube    Hemorrhoids    History of diverticulitis of colon    IBS (irritable bowel syndrome)    Liver lesion 2011   Neoplasm of skin of face    malignant   OA (osteoarthritis)    knee, NECK   Obesity, unspecified    Ostium secundum type atrial septal defect    Other and unspecified hyperlipidemia    Other malaise and fatigue    Palpitations    Shortness of breath    Thyroid disease    Unspecified essential hypertension    pt states she does not have HTN   Unspecified sinusitis (chronic)    Unspecified vitamin D deficiency     Patient Active Problem List   Diagnosis Date Noted   Cardiac symptoms with risk for coronary heart disease between 10% and 20% in next 10 years 07/01/2020   Bilateral leg edema 09/11/2019   Elevated TSH 09/11/2019   Anxiety 04/10/2019   GERD (gastroesophageal reflux disease) [id number]   Complicated grief A999333   Hypokalemia 05/14/2017   Intradural arachnoid cyst of spine 01/31/2017   Allergic reaction 11/12/2013   Essential hypertension 12/06/2010   MIGRAINE, CHRONIC 11/24/2010   Vitamin D deficiency 04/08/2010  DEPRESSION, MILD 03/09/2010   RENAL CYST 12/22/2009   Acute sinusitis 09/20/2009   Obesity (BMI 30.0-34.9) 08/11/2008   NEOPLASM, MALIGNANT, SKIN, FACE 12/01/2007   PATENT FORAMEN OVALE 01/28/2005   Mixed hyperlipidemia 03/30/2001    Past Surgical History:  Procedure Laterality Date   ABDOMINAL HYSTERECTOMY     BREAST REDUCTION SURGERY  1990   CERVICAL [place]   CHOLECYSTECTOMY     COLONOSCOPY     DILATION AND CURETTAGE OF UTERUS     EYE SURGERY Bilateral 07/30/2017   cataract sx   FLEXIBLE SIGMOIDOSCOPY  01/08/2012   Procedure: FLEXIBLE SIGMOIDOSCOPY;  Surgeon: Lafayette Dragon, MD;  Location: [facility];  Service: Endoscopy;   Laterality: N/A;   HEMORRHOID SURGERY     KNEE ARTHROTOMY     pt states no    LAMINECTOMY N/A 01/31/2017   Procedure: Thoracic Laminectomy and marsupialization of arachnoid cyst;  Surgeon: Jovita Gamma, MD;  Location: [facility];  Service: Neurosurgery;  Laterality: N/A;   LEFT OOPHORECTOMY  1976   NECK SURGERY N/A 03/2018   PARTIAL HYSTERECTOMY     right ovary remains   REDUCTION MAMMAPLASTY     TONSILLECTOMY  1972   UPPER GASTROINTESTINAL ENDOSCOPY       OB History   No obstetric history on file.     Family History  Problem Relation Age of Onset   Hypertension Mother    Diabetes Mother    Cirrhosis Father    Heart disease Father    Hypertension Sister    Diabetes Sister    Heart disease Brother        x 2   Breast cancer Cousin    Diabetes Cousin    Diabetes Other        aunt   Stroke Other        aunt   Colon cancer Neg Hx    Esophageal cancer Neg Hx    Stomach cancer Neg Hx    Rectal cancer Neg Hx     Social History   Tobacco Use   Smoking status: Never   Smokeless tobacco: Never  Vaping Use   Vaping Use: Never used  Substance Use Topics   Alcohol use: No   Drug use: No    Home Medications Prior to Admission medications   Medication Sig Start Date End Date Taking? Authorizing Provider  doxycycline (VIBRAMYCIN) 100 MG capsule Take 1 capsule (100 mg total) by mouth 2 (two) times daily. 07/10/21  Yes Zaydyn Havey, PA-C  fluticasone (FLONASE) 50 MCG/ACT nasal spray Place 1 spray into both nostrils daily. 07/10/21  Yes Forrestine Lecrone, PA-C  Cholecalciferol (VITAMIN D3) 2000 units TABS Take 2,000 Units by mouth daily.    [provider]  ezetimibe (ZETIA) 10 MG tablet Take 1 tablet (10 mg total) by mouth daily. 06/01/21   Cirigliano, Mary K, DO  fluticasone (FLONASE) 50 MCG/ACT nasal spray Place 1 spray into both nostrils as needed for allergies.     [provider]  hydrochlorothiazide (HYDRODIURIL) 25 MG tablet TAKE 1 TABLET BY MOUTH  EVERYDAY AT BEDTIME Patient taking differently: TAKE 1 TABLET BY MOUTH EVERYDAY IN THE AM 04/07/21   Cirigliano, Garvin Fila, DO  HYDROcodone-acetaminophen (NORCO/VICODIN) 5-325 MG tablet Take 1 tablet by mouth every 4 (four) hours as needed. 07/05/21   Lacretia Leigh, MD  ibuprofen (ADVIL) 400 MG tablet Take 1 tablet (400 mg total) by mouth every 6 (six) hours as needed. 07/05/21  Lacretia Leigh, MD  levothyroxine (SYNTHROID) 25 MCG tablet Take 1 tablet (25 mcg total) by mouth daily before breakfast. 04/07/21   Cirigliano, Garvin Fila, DO  methocarbamol (ROBAXIN) 500 MG tablet Take 1 tablet (500 mg total) by mouth 2 (two) times daily. 07/05/21   Lacretia Leigh, MD    Allergies    Simvastatin, Statins, Bactrim [sulfamethoxazole-trimethoprim], and Sertraline  Review of Systems   Review of Systems  Constitutional:  Negative for chills, fatigue and fever.  HENT:  Positive for hearing loss (muffled hearing) and sinus pressure.   Eyes:  Negative for visual disturbance.  Respiratory:  Negative for cough and shortness of breath.   Gastrointestinal:  Negative for nausea and vomiting.  Musculoskeletal:  Negative for myalgias.  Neurological:  Positive for headaches. Negative for speech difficulty, weakness and numbness.  All other systems reviewed and are negative.  Physical Exam Updated Vital Signs BP (!) 171/81   Pulse 70   Temp 98.5 F (36.9 C) (Oral)   Resp 18   SpO2 100%   Physical Exam Vitals and nursing note reviewed.  Constitutional:      Appearance: She is well-developed. She is not ill-appearing or diaphoretic.  HENT:     Head: Normocephalic and atraumatic.     Nose:     Left Sinus: Maxillary sinus tenderness and frontal sinus tenderness present.  Eyes:     Extraocular Movements: Extraocular movements intact.     Right eye: No nystagmus.     Left eye: No nystagmus.     Conjunctiva/sclera: Conjunctivae normal.     Pupils: Pupils are equal, round, and reactive to light.  Cardiovascular:      Rate and Rhythm: Normal rate and regular rhythm.  Pulmonary:     Effort: Pulmonary effort is normal.     Breath sounds: Normal breath sounds. No wheezing, rhonchi or rales.  Skin:    General: Skin is warm and dry.     Coloration: Skin is not jaundiced.  Neurological:     Mental Status: She is alert.     Comments: Alert and oriented to self, place, time and event.   Speech is fluent, clear without dysarthria or dysphasia.   Strength 5/5 in upper/lower extremities   Sensation intact in upper/lower extremities   Normal gait.  Negative Romberg. No pronator drift.  Normal finger-to-nose and feet tapping.  CN I not tested  CN II grossly intact visual fields bilaterally. Did not visualize posterior eye.  CN III, IV, VI PERRLA and EOMs intact bilaterally  CN V Intact sensation to sharp and light touch to the face  CN VII facial movements symmetric  CN VIII not tested  CN IX, X no uvula deviation, symmetric rise of soft palate  CN XI 5/5 SCM and trapezius strength bilaterally  CN XII Midline tongue protrusion, symmetric L/R movements      ED Results / Procedures / Treatments   Labs (all labs ordered are listed, but only abnormal results are displayed) Labs Reviewed - No data to display  EKG None  Radiology No results found.  Procedures Procedures   Medications Ordered in ED Medications - No data to display  ED Course  I have reviewed the triage vital signs and the nursing notes.  Pertinent labs & imaging results that were available during my care of the patient were reviewed by me and considered in my medical decision making (see chart for details).    MDM Rules/Calculators/A&P  70 year old female who presents to the ED today with recurrent left-sided headache with muffled hearing and feeling like she is in a "vacuum" as well as worsening pressure in her head with bending.  She was seen in the ED a couple of days ago with negative CT head.   Continues to have pain.  On arrival to the ED today vitals are stable.  Patient afebrile, nontachycardic and nontachypneic.  Blood pressure is elevated at 171/81.  She does have history of high blood pressure and takes HCTZ for same.  She has no focal neurodeficits on exam today and no meningeal signs.  She is noted to have maxillary and frontal sinus pressure with palpation.  I do suspect she likely is suffering from a sinus headache at this time.  Given it has been greater than 10 days I do feel she would benefit from antibiotics at this time.  Have also recommended Flonase to help with the fluid in her ears.  She is recommended to follow-up with her PCP for same and to return to the ED for any new/worsening symptoms.  Patient is somewhat tearful on exam.  She does admit that she lost her son to a brain tumor and has been persistently worried that she could have 1 as well.  She did have a negative CT head done a couple of days ago and I do not feel there is benefit to repeating this at this time.  After reassurance patient feels somewhat more relieved.  I do not feel she requires additional imaging today.  I have recommended she try the antibiotic and if no improvement to return to the ED at that time.  She is stable for discharge today.  This note was prepared using Dragon voice recognition software and may include unintentional dictation errors due to the inherent limitations of voice recognition software.  Final Clinical Impression(s) / ED Diagnoses Final diagnoses:  Sinus headache  Acute non-recurrent sinusitis, unspecified location    Rx / DC Orders ED Discharge Orders          Ordered    doxycycline (VIBRAMYCIN) 100 MG capsule  2 times daily        07/10/21 1250    fluticasone (FLONASE) 50 MCG/ACT nasal spray  Daily        07/10/21 1250             Discharge Instructions      Please pick up antibiotic and take as prescribed. Use the Flonase as directed to help with fluid in your  sinuses.   Follow up with your PCP regarding ED visit today  Return to the ED for any new/worsening symptoms including worsening headache, vision changes, speech changes, confusion, unilateral weakness or numbness, passing out, or any other new/concerning symptoms.        Eustaquio Maize, PA-C 07/10/21 1252    Godfrey Pick, MD 07/11/21 1531

## 2021-07-10 NOTE — ED Triage Notes (Signed)
Pt returns today, still with c/o left head pain/fullness and left ear feels full.has affected her hearing. Pt states she is suffering from head congestion as well.

## 2021-07-10 NOTE — ED Notes (Signed)
Pt dc home, reviewed dc instructions. Pt voiced understanding.

## 2021-09-30 ENCOUNTER — Other Ambulatory Visit: Payer: Self-pay | Admitting: Family Medicine

## 2021-09-30 DIAGNOSIS — Z1231 Encounter for screening mammogram for malignant neoplasm of breast: Secondary | ICD-10-CM

## 2021-10-06 ENCOUNTER — Other Ambulatory Visit: Payer: Self-pay

## 2021-10-06 ENCOUNTER — Encounter: Payer: Self-pay | Admitting: Family Medicine

## 2021-10-06 ENCOUNTER — Ambulatory Visit
Admission: RE | Admit: 2021-10-06 | Discharge: 2021-10-06 | Disposition: A | Discharge status: Home / Self Care | Payer: Medicare PPO | Source: Ambulatory Visit | Attending: Family Medicine | Admitting: Family Medicine

## 2021-10-06 ENCOUNTER — Ambulatory Visit: Payer: Medicare PPO

## 2021-10-06 DIAGNOSIS — E559 Vitamin D deficiency, unspecified: Secondary | ICD-10-CM

## 2021-10-06 DIAGNOSIS — M8589 Other specified disorders of bone density and structure, multiple sites: Secondary | ICD-10-CM

## 2021-10-06 DIAGNOSIS — M8580 Other specified disorders of bone density and structure, unspecified site: Secondary | ICD-10-CM | POA: Insufficient documentation

## 2021-10-06 DIAGNOSIS — Z1231 Encounter for screening mammogram for malignant neoplasm of breast: Secondary | ICD-10-CM

## 2021-11-28 ENCOUNTER — Encounter: Payer: Self-pay | Admitting: Family

## 2021-11-28 ENCOUNTER — Other Ambulatory Visit: Payer: Self-pay

## 2021-11-28 ENCOUNTER — Ambulatory Visit: Payer: Medicare PPO | Admitting: Family

## 2021-11-28 VITALS — BP 138/76 | HR 70 | Temp 96.4°F | Ht 66.0 in | Wt 179.0 lb

## 2021-11-28 DIAGNOSIS — E559 Vitamin D deficiency, unspecified: Secondary | ICD-10-CM | POA: Diagnosis not present

## 2021-11-28 DIAGNOSIS — I10 Essential (primary) hypertension: Secondary | ICD-10-CM

## 2021-11-28 DIAGNOSIS — J0111 Acute recurrent frontal sinusitis: Secondary | ICD-10-CM | POA: Diagnosis not present

## 2021-11-28 DIAGNOSIS — M545 Low back pain, unspecified: Secondary | ICD-10-CM | POA: Insufficient documentation

## 2021-11-28 DIAGNOSIS — E039 Hypothyroidism, unspecified: Secondary | ICD-10-CM | POA: Insufficient documentation

## 2021-11-28 DIAGNOSIS — R350 Frequency of micturition: Secondary | ICD-10-CM | POA: Diagnosis not present

## 2021-11-28 LAB — COMPREHENSIVE METABOLIC PANEL
ALT: 12 U/L (ref 0–35)
AST: 15 U/L (ref 0–37)
Albumin: 4.2 g/dL (ref 3.5–5.2)
Alkaline Phosphatase: 72 U/L (ref 39–117)
BUN: 9 mg/dL (ref 6–23)
CO2: 30 mEq/L (ref 19–32)
Calcium: 10.3 mg/dL (ref 8.4–10.5)
Chloride: 100 mEq/L (ref 96–112)
Creatinine, Ser: 0.87 mg/dL (ref 0.40–1.20)
GFR: 66.88 mL/min (ref >60.00)
Glucose, Bld: 91 mg/dL (ref 70–99)
Potassium: 3.2 mEq/L — ABNORMAL LOW (ref 3.5–5.1)
Sodium: 142 mEq/L (ref 135–145)
Total Bilirubin: 0.7 mg/dL (ref 0.2–1.2)
Total Protein: 6.8 g/dL (ref 6.0–8.3)

## 2021-11-28 LAB — CBC WITH DIFFERENTIAL/PLATELET
Basophils Absolute: 0 10*3/uL (ref 0.0–0.1)
Basophils Relative: 0.5 % (ref 0.0–3.0)
Eosinophils Absolute: 0.2 10*3/uL (ref 0.0–0.7)
Eosinophils Relative: 3 % (ref 0.0–5.0)
HCT: 44.8 % (ref 36.0–46.0)
Hemoglobin: 14.8 g/dL (ref 12.0–15.0)
Lymphocytes Relative: 34.1 % (ref 12.0–46.0)
Lymphs Abs: 2.1 10*3/uL (ref 0.7–4.0)
MCHC: 33.1 g/dL (ref 30.0–36.0)
MCV: 90.6 fl (ref 78.0–100.0)
Monocytes Absolute: 0.4 10*3/uL (ref 0.1–1.0)
Monocytes Relative: 7.1 % (ref 3.0–12.0)
Neutro Abs: 3.4 10*3/uL (ref 1.4–7.7)
Neutrophils Relative %: 55.3 % (ref 43.0–77.0)
Platelets: 434 10*3/uL — ABNORMAL HIGH (ref 150.0–400.0)
RBC: 4.94 Mil/uL (ref 3.87–5.11)
RDW: 12.6 % (ref 11.5–15.5)
WBC: 6.2 10*3/uL (ref 4.0–10.5)

## 2021-11-28 LAB — TSH: TSH: 3.35 u[IU]/mL (ref 0.35–5.50)

## 2021-11-28 LAB — VITAMIN D 25 HYDROXY (VIT D DEFICIENCY, FRACTURES): VITD: 78.74 ng/mL (ref 30.00–100.00)

## 2021-11-28 MED ORDER — AMOXICILLIN-POT CLAVULANATE 875-125 MG PO TABS: 1.0000 | ORAL_TABLET | Freq: Two times a day (BID) | ORAL | 0 refills | Status: DC

## 2021-11-28 NOTE — Progress Notes (Signed)
Established Patient Office Visit  Subjective:  Patient ID: Nicole Daniels, female    DOB: [birth year]  Age: 71 y.o. MRN: [id number]  CC:  Chief Complaint  Patient presents with   Facial Pain    Sinus pain   Ear Pain    HPI REIS PIENTA is here for a transition of care visit.  Prior provider was:Dr. Marjory Lies Pt is with acute concerns.   Pt has had recurrent sinusitis especially with changing of weather. Ongoing , recurrent since around January 2022. Pt c/o two week symptoms, to include sinus pressure, nasal congestion, cough productive of green yellow sputum, right ear pain/fullness. No fever/chills.   Taking otc sinus medication with some relief. Using flonase intermittently.  Over the last 1.5 weeks, right lower back pain with some tenderness. Hurts with movements, like reaching across her body. Some mild urinary frequency but it depends on how much she drinks, no dysuria, and no urgency.    chronic concerns:  Hypothyroidism: taking levothyroxine 25 mcg once daily, does take thirty minutes prior to food. She does have hair loss, hard to lose weight, joint pains and also fatigue.   Colonscopy: last 8/22, repeat in three years.  Mammogram 10/06/21  Past Medical History:  Diagnosis Date   Abdominal pain, right lower quadrant    Acquired cyst of kidney 01/08/2012   pt denies history of    Acute sinusitis, unspecified    Adenomatous colon polyp    Allergy    Angiomyolipoma    kidney   Anxiety    OCCASIONAL   Chest pain    Chest pain, unspecified    Depressive disorder, not elsewhere classified 01/08/2012   not currently    Diverticulosis    Dizziness and giddiness    Dysfunction of eustachian tube    Hemorrhoids    History of diverticulitis of colon    IBS (irritable bowel syndrome)    Liver lesion 2011   Neoplasm of skin of face    malignant   OA (osteoarthritis)    knee, NECK   Obesity, unspecified    Ostium secundum type atrial septal defect     Other and unspecified hyperlipidemia    Other malaise and fatigue    Palpitations    Shortness of breath    Thyroid disease    Unspecified essential hypertension    pt states she does not have HTN   Unspecified sinusitis (chronic)    Unspecified vitamin D deficiency     Past Surgical History:  Procedure Laterality Date   ABDOMINAL HYSTERECTOMY     BREAST REDUCTION SURGERY  1990   CERVICAL [place]   CHOLECYSTECTOMY     COLONOSCOPY     DILATION AND CURETTAGE OF UTERUS     EYE SURGERY Bilateral 07/30/2017   cataract sx   FLEXIBLE SIGMOIDOSCOPY  01/08/2012   Procedure: FLEXIBLE SIGMOIDOSCOPY;  Surgeon: Lafayette Dragon, MD;  Location: [facility];  Service: Endoscopy;  Laterality: N/A;   HEMORRHOID SURGERY     KNEE ARTHROTOMY     pt states no    LAMINECTOMY N/A 01/31/2017   Procedure: Thoracic Laminectomy and marsupialization of arachnoid cyst;  Surgeon: Jovita Gamma, MD;  Location: [facility];  Service: Neurosurgery;  Laterality: N/A;   LEFT OOPHORECTOMY  1976   NECK SURGERY N/A 03/2018   PARTIAL HYSTERECTOMY     right ovary remains   REDUCTION MAMMAPLASTY     TONSILLECTOMY  1972   UPPER GASTROINTESTINAL  ENDOSCOPY      Family History  Problem Relation Age of Onset   Hypertension Mother    Diabetes Mother    Cirrhosis Father    Heart disease Father    Hypertension Sister    Diabetes Sister    Heart disease Brother        x 2   Breast cancer Cousin    Diabetes Cousin    Diabetes Other        aunt   Stroke Other        aunt   Colon cancer Neg Hx    Esophageal cancer Neg Hx    Stomach cancer Neg Hx    Rectal cancer Neg Hx     Social History   Socioeconomic History   Marital status: Widowed    Spouse name: Not on file   Number of children: 2   Years of education: Not on file   Highest education level: Not on file  Occupational History   Occupation: retired  Tobacco Use   Smoking status: Never   Smokeless tobacco: Never  Vaping Use    Vaping Use: Never used  Substance and Sexual Activity   Alcohol use: No   Drug use: No   Sexual activity: Yes  Other Topics Concern   Not on file  Social History Narrative   Daily caffeine    Social Determinants of Health   Financial Resource Strain: Low Risk    Difficulty of Paying Living Expenses: Not hard at all  Food Insecurity: No Food Insecurity   Worried About Charity fundraiser in the Last Year: Never true   [place] in the Last Year: Never true  Transportation Needs: No Transportation Needs   Lack of Transportation (Medical): No   Lack of Transportation (Non-Medical): No  Physical Activity: Sufficiently Active   Days of Exercise per Week: 7 days   Minutes of Exercise per Session: 30 min  Stress: No Stress Concern Present   Feeling of Stress : Not at all  Social Connections: Moderately Integrated   Frequency of Communication with Friends and Family: More than three times a week   Frequency of Social Gatherings with Friends and Family: More than three times a week   Attends Religious Services: More than 4 times per year   Active Member of [employer] or Organizations: Yes   Attends Archivist Meetings: More than 4 times per year   Marital Status: Widowed  Human resources officer Violence: Not At Risk   Fear of Current or Ex-Partner: No   Emotionally Abused: No   Physically Abused: No   Sexually Abused: No    Outpatient Medications Prior to Visit  Medication Sig Dispense Refill   Cholecalciferol (VITAMIN D3) 2000 units TABS Take 2,000 Units by mouth daily.     ezetimibe (ZETIA) 10 MG tablet Take 1 tablet (10 mg total) by mouth daily. 90 tablet 3   fluticasone (FLONASE) 50 MCG/ACT nasal spray Place 1 spray into both nostrils as needed for allergies.      hydrochlorothiazide (HYDRODIURIL) 25 MG tablet TAKE 1 TABLET BY MOUTH EVERYDAY AT BEDTIME (Patient taking differently: TAKE 1 TABLET BY MOUTH EVERYDAY IN THE AM) 90 tablet 3   levothyroxine (SYNTHROID) 25 MCG  tablet Take 1 tablet (25 mcg total) by mouth daily before breakfast. 90 tablet 3   ibuprofen (ADVIL) 400 MG tablet Take 1 tablet (400 mg total) by mouth every 6 (six) hours as needed. 30 tablet 0  doxycycline (VIBRAMYCIN) 100 MG capsule Take 1 capsule (100 mg total) by mouth 2 (two) times daily. 20 capsule 0   fluticasone (FLONASE) 50 MCG/ACT nasal spray Place 1 spray into both nostrils daily. 9.9 mL 0   HYDROcodone-acetaminophen (NORCO/VICODIN) 5-325 MG tablet Take 1 tablet by mouth every 4 (four) hours as needed. 10 tablet 0   methocarbamol (ROBAXIN) 500 MG tablet Take 1 tablet (500 mg total) by mouth 2 (two) times daily. 20 tablet 0   Facility-Administered Medications Prior to Visit  Medication Dose Route Frequency Provider Last Rate Last Admin   0.9 %  sodium chloride infusion  500 mL Intravenous Once [place], MD        Allergies  Allergen Reactions   Simvastatin Other (See Comments)    MYALGIAS   Statins Other (See Comments)    Has severe leg muscle aches and cramps   Bactrim [Sulfamethoxazole-Trimethoprim] Itching   Sertraline Anxiety    ROS Review of Systems  Constitutional:  Positive for fatigue and unexpected weight change (hard to lose weight). Negative for chills and fever.  HENT:  Positive for ear pain (right), postnasal drip, rhinorrhea and sinus pressure (frontal and maxillary). Negative for congestion and sore throat.   Respiratory:  Positive for cough (productive green/yellow sputum). Negative for shortness of breath and wheezing.   Cardiovascular:  Negative for chest pain and palpitations.  Genitourinary:  Negative for flank pain.  Musculoskeletal:  Positive for arthralgias and back pain (right lower back).      Objective:    Physical Exam Constitutional:      General: She is not in acute distress.    Appearance: Normal appearance. She is obese. She is not ill-appearing, toxic-appearing or diaphoretic.  HENT:     Head: Normocephalic.     Right Ear:  Tympanic membrane normal.     Left Ear: Tympanic membrane normal.     Nose:     Left Nostril: Epistaxis (mild dry blood not active) present.     Left Turbinates: Swollen.     Right Sinus: Maxillary sinus tenderness and frontal sinus tenderness present.     Left Sinus: Maxillary sinus tenderness and frontal sinus tenderness present.     Mouth/Throat:     Mouth: Mucous membranes are moist.  Eyes:     Pupils: Pupils are equal, round, and reactive to light.  Cardiovascular:     Rate and Rhythm: Normal rate and regular rhythm.  Pulmonary:     Effort: Pulmonary effort is normal.     Breath sounds: Normal breath sounds.  Musculoskeletal:     Right lower leg: No edema.     Left lower leg: No edema.  Neurological:     General: No focal deficit present.     Mental Status: She is alert and oriented to person, place, and time.  Psychiatric:        Mood and Affect: Mood normal.        Behavior: Behavior normal.        Thought Content: Thought content normal.        Judgment: Judgment normal.    Gen: NAD, resting comfortably CV: RRR with no murmurs appreciated Pulm: NWOB, CTAB with no crackles, wheezes, or rhonchi Skin: warm, dry Psych: Normal affect and thought content  BP 138/76    Pulse 70    Temp (!) 96.4 F (35.8 C) (Temporal)    Ht 5\' 6"  (1.676 m)    Wt 179 lb (81.2 kg)    SpO2  96%    BMI 28.89 kg/m  Wt Readings from Last 3 Encounters:  11/28/21 179 lb (81.2 kg)  07/05/21 189 lb 9.6 oz (86 kg)  06/16/21 185 lb (83.9 kg)     Health Maintenance Due  Topic Date Due   Pneumonia Vaccine 74+ Years old (1 - PCV) Never done   Zoster Vaccines- Shingrix (1 of 2) Never done   TETANUS/TDAP  08/25/2018   COVID-19 Vaccine (3 - Pfizer risk series) 08/13/2020    There are no preventive care reminders to display for this patient.  Lab Results  Component Value Date   TSH 4.31 04/07/2021   Lab Results  Component Value Date   WBC 5.4 04/07/2021   HGB 14.7 04/07/2021   HCT 42.9  04/07/2021   MCV 90.3 04/07/2021   PLT 401.0 (H) 04/07/2021   Lab Results  Component Value Date   NA 142 04/07/2021   K 3.6 04/07/2021   CO2 30 04/07/2021   GLUCOSE 101 (H) 04/07/2021   BUN 14 04/07/2021   CREATININE 0.82 04/07/2021   BILITOT 0.8 04/07/2021   ALKPHOS 69 04/07/2021   AST 17 04/07/2021   ALT 15 04/07/2021   PROT 6.9 04/07/2021   ALBUMIN 4.2 04/07/2021   CALCIUM 9.7 04/07/2021   ANIONGAP 10 05/20/2020   GFR 72.13 04/07/2021   Lab Results  Component Value Date   CHOL 252 (H) 04/07/2021   Lab Results  Component Value Date   HDL 50.70 04/07/2021   Lab Results  Component Value Date   LDLCALC 146 (H) 03/03/2020   Lab Results  Component Value Date   TRIG 223.0 (H) 04/07/2021   Lab Results  Component Value Date   CHOLHDL 5 04/07/2021   Lab Results  Component Value Date   HGBA1C 5.7 03/03/2020      Assessment & Plan:   Problem List Items Addressed This Visit       Cardiovascular and Mediastinum   Essential hypertension    Continue on HCTZ, Pt advised of the following:  Continue medication as prescribed. Monitor blood pressure periodically and/or when you feel symptomatic. Goal is <130/90 on average. Ensure that you have rested for 30 minutes prior to checking your blood pressure. Record your readings and bring them to your next visit if necessary.work on a low sodium diet.         Respiratory   Acute sinusitis    Prescription given for augmentin 875/125 mg po bid for ten days. Pt to continue tylenol/ibuprofen prn sinus pain. Continue with humidifier prn and steam showers recommended as well. instructed If no symptom improvement in 48 hours please f/u       Relevant Medications   amoxicillin-clavulanate (AUGMENTIN) 875-125 MG tablet   Other Relevant Orders   CBC w/Diff     Endocrine   Hypothyroidism - Primary    Symptomatic, ordering TSH pending results. May adjust levothyroxine accordingly.      Relevant Orders   TSH     Other    Vitamin D deficiency    Ordering vitamin D, pending results.      Relevant Orders   VITAMIN D 25 Hydroxy (Vit-D Deficiency, Fractures)   Acute right-sided low back pain without sciatica    Suspected muscular, however will order urine and cmp to check kidney status. Warm compression, tylenol prn. Pending results.      Urinary frequency    Ordered urine with culture, pending results.       Relevant Orders   Urinalysis  w microscopic + reflex cultur   Comprehensive metabolic panel    Meds ordered this encounter  Medications   amoxicillin-clavulanate (AUGMENTIN) 875-125 MG tablet    Sig: Take 1 tablet by mouth 2 (two) times daily.    Dispense:  20 tablet    Refill:  0    Order Specific Question:   Supervising Provider    Answer:   Diona Browner, AMY E [[id number]]    Follow-up: Return in about 6 months (around 05/28/2022) for for annual physical appt.    Eugenia Pancoast, FNP

## 2021-11-28 NOTE — Assessment & Plan Note (Signed)
Ordered urine with culture, pending results.

## 2021-11-28 NOTE — Assessment & Plan Note (Signed)
Symptomatic, ordering TSH pending results. May adjust levothyroxine accordingly.

## 2021-11-28 NOTE — Assessment & Plan Note (Signed)
Suspected muscular, however will order urine and cmp to check kidney status. Warm compression, tylenol prn. Pending results.

## 2021-11-28 NOTE — Patient Instructions (Addendum)
Stop by the lab prior to leaving today. I will notify you of your results once received.   You are overdue for the following vaccinations:  Pneumonia vaccine Shingles vaccine Tetanus vaccine   Start prescription of augmentin 875/125 mg and take as prescribed.  Tylenol/ibuprofen ok for sinus pain as needed Increase oral fluids. Ok to continue with humidifers and hot steamy showers as discussed during visit.  It was a pleasure speaking with you today, I hope you start feeling better soon.  Regards,   Eugenia Pancoast

## 2021-11-28 NOTE — Assessment & Plan Note (Signed)
Ordering vitamin D, pending results.

## 2021-11-28 NOTE — Assessment & Plan Note (Signed)
Continue on HCTZ, Pt advised of the following:  Continue medication as prescribed. Monitor blood pressure periodically and/or when you feel symptomatic. Goal is <130/90 on average. Ensure that you have rested for 30 minutes prior to checking your blood pressure. Record your readings and bring them to your next visit if necessary.work on a low sodium diet.

## 2021-11-28 NOTE — Assessment & Plan Note (Signed)
Prescription given for augmentin 875/125 mg po bid for ten days. Pt to continue tylenol/ibuprofen prn sinus pain. Continue with humidifier prn and steam showers recommended as well. instructed If no symptom improvement in 48 hours please f/u ? ?

## 2021-11-29 ENCOUNTER — Other Ambulatory Visit: Payer: Self-pay | Admitting: Family

## 2021-11-29 DIAGNOSIS — E039 Hypothyroidism, unspecified: Secondary | ICD-10-CM

## 2021-11-29 DIAGNOSIS — E876 Hypokalemia: Secondary | ICD-10-CM

## 2021-11-29 DIAGNOSIS — B379 Candidiasis, unspecified: Secondary | ICD-10-CM

## 2021-11-29 MED ORDER — FLUCONAZOLE 150 MG PO TABS
150.0000 mg | ORAL_TABLET | Freq: Once | ORAL | 0 refills | Status: AC
Start: 2021-11-29 — End: 2021-11-29

## 2021-11-29 MED ORDER — LEVOTHYROXINE SODIUM 50 MCG PO TABS
50.0000 ug | ORAL_TABLET | Freq: Every day | ORAL | 1 refills | Status: DC
Start: 2021-11-29 — End: 2022-01-25

## 2021-11-29 MED ORDER — POTASSIUM CHLORIDE CRYS ER 10 MEQ PO TBCR: 10.0000 meq | EXTENDED_RELEASE_TABLET | Freq: Every day | ORAL | 0 refills | Status: DC

## 2021-11-29 NOTE — Progress Notes (Signed)
Please also let pt know that her urine came back with yeast in the urine so I am sending her in an antifungal medication (diflucan) for her to start.

## 2021-11-29 NOTE — Progress Notes (Signed)
id 

## 2021-11-29 NOTE — Progress Notes (Signed)
Potassium is just the tinest bit low, so I have sent in potassium 10 meq to take once daily x 10 days.   Also, your TSH is not in the desired range of 1-2 and you are symptomatic so I am increasing your levothyroxine to 50 mcg once daily. You can use up your 25 mcg by doubling, and then the new dose has been sent to your pharmacy.   Schedule a lab only appt in one month and repeat TSH and potassium (orders are placed) we will go from there.

## 2021-11-30 LAB — URINALYSIS W MICROSCOPIC + REFLEX CULTURE
Bacteria, UA: NONE SEEN /HPF
Bilirubin Urine: NEGATIVE
Glucose, UA: NEGATIVE
Nitrites, Initial: NEGATIVE
Specific Gravity, Urine: 1.026 (ref 1.001–1.035)
pH: 6.5 (ref 5.0–8.0)

## 2021-11-30 LAB — URINE CULTURE
MICRO NUMBER:: 12942674
SPECIMEN QUALITY:: ADEQUATE

## 2021-11-30 LAB — CULTURE INDICATED

## 2021-12-20 ENCOUNTER — Ambulatory Visit
Admission: RE | Admit: 2021-12-20 | Discharge: 2021-12-20 | Disposition: A | Discharge status: Home / Self Care | Payer: Medicare PPO | Source: Ambulatory Visit | Attending: Family | Admitting: Family

## 2021-12-20 ENCOUNTER — Ambulatory Visit: Payer: Medicare PPO | Admitting: Family

## 2021-12-20 ENCOUNTER — Other Ambulatory Visit: Payer: Self-pay

## 2021-12-20 ENCOUNTER — Encounter: Payer: Self-pay | Admitting: Family

## 2021-12-20 VITALS — BP 128/78 | HR 72 | Ht 64.5 in | Wt 179.0 lb

## 2021-12-20 DIAGNOSIS — R109 Unspecified abdominal pain: Secondary | ICD-10-CM | POA: Diagnosis not present

## 2021-12-20 DIAGNOSIS — D1771 Benign lipomatous neoplasm of kidney: Secondary | ICD-10-CM

## 2021-12-20 DIAGNOSIS — N281 Cyst of kidney, acquired: Secondary | ICD-10-CM

## 2021-12-20 DIAGNOSIS — R311 Benign essential microscopic hematuria: Secondary | ICD-10-CM | POA: Diagnosis not present

## 2021-12-20 DIAGNOSIS — R1031 Right lower quadrant pain: Secondary | ICD-10-CM

## 2021-12-20 DIAGNOSIS — B029 Zoster without complications: Secondary | ICD-10-CM | POA: Insufficient documentation

## 2021-12-20 DIAGNOSIS — R21 Rash and other nonspecific skin eruption: Secondary | ICD-10-CM

## 2021-12-20 LAB — COMPREHENSIVE METABOLIC PANEL
ALT: 16 U/L (ref 0–35)
AST: 20 U/L (ref 0–37)
Albumin: 4.4 g/dL (ref 3.5–5.2)
Alkaline Phosphatase: 70 U/L (ref 39–117)
BUN: 16 mg/dL (ref 6–23)
CO2: 36 mEq/L — ABNORMAL HIGH (ref 19–32)
Calcium: 10.4 mg/dL (ref 8.4–10.5)
Chloride: 100 mEq/L (ref 96–112)
Creatinine, Ser: 0.87 mg/dL (ref 0.40–1.20)
GFR: 66.85 mL/min (ref >60.00)
Glucose, Bld: 91 mg/dL (ref 70–99)
Potassium: 3.4 mEq/L — ABNORMAL LOW (ref 3.5–5.1)
Sodium: 140 mEq/L (ref 135–145)
Total Bilirubin: 0.8 mg/dL (ref 0.2–1.2)
Total Protein: 7.4 g/dL (ref 6.0–8.3)

## 2021-12-20 LAB — CBC WITH DIFFERENTIAL/PLATELET
Basophils Absolute: 0 10*3/uL (ref 0.0–0.1)
Basophils Relative: 0.4 % (ref 0.0–3.0)
Eosinophils Absolute: 0.2 10*3/uL (ref 0.0–0.7)
Eosinophils Relative: 2.9 % (ref 0.0–5.0)
HCT: 45.9 % (ref 36.0–46.0)
Hemoglobin: 15.3 g/dL — ABNORMAL HIGH (ref 12.0–15.0)
Lymphocytes Relative: 19.9 % (ref 12.0–46.0)
Lymphs Abs: 1.3 10*3/uL (ref 0.7–4.0)
MCHC: 33.3 g/dL (ref 30.0–36.0)
MCV: 91.4 fl (ref 78.0–100.0)
Monocytes Absolute: 0.7 10*3/uL (ref 0.1–1.0)
Monocytes Relative: 10.3 % (ref 3.0–12.0)
Neutro Abs: 4.2 10*3/uL (ref 1.4–7.7)
Neutrophils Relative %: 66.5 % (ref 43.0–77.0)
Platelets: 351 10*3/uL (ref 150.0–400.0)
RBC: 5.03 Mil/uL (ref 3.87–5.11)
RDW: 13.1 % (ref 11.5–15.5)
WBC: 6.4 10*3/uL (ref 4.0–10.5)

## 2021-12-20 MED ORDER — VALACYCLOVIR HCL 1 G PO TABS: 1000.0000 mg | ORAL_TABLET | Freq: Three times a day (TID) | ORAL | 0 refills | Status: AC

## 2021-12-20 NOTE — Assessment & Plan Note (Signed)
Suspected zoster however just started today, too soon to verify as also without tingling/burining neuralgia sensation however appears to be. Will treat with valtrex.

## 2021-12-20 NOTE — Assessment & Plan Note (Signed)
Repeat ct scan to see if any increase in size.

## 2021-12-20 NOTE — Assessment & Plan Note (Signed)
Pt thinks was removed during hysterectomy but with rlq pain so can not r/o appendicitis as not able to confirm via surgical history and pt unsure.

## 2021-12-20 NOTE — Assessment & Plan Note (Addendum)
If any worsening pain go to ER.  Stat ct abd pelvis to r/o kidney stone. Calcium ox in urine last visit.

## 2021-12-20 NOTE — Assessment & Plan Note (Signed)
Repeat urine today, pending urine culture repeat as well.

## 2021-12-20 NOTE — Progress Notes (Signed)
Established Patient Office Visit  Subjective:  Patient ID: Nicole Daniels, female    DOB: 05-Nov-1950  Age: 71 y.o. MRN: [id number]  CC:  Chief Complaint  Patient presents with   Back Pain    Pt stated--lower center back elevate around hip up to the breast dull pain, stretching feeling, worse when standing. --6 weeks.    HPI Nicole Daniels is here today for follow up.  Pt is without acute concerns.  Hypothyroid: did increase to 50 mcg once daily , tolerating well.   Right lower back pain with tenderness, hurting with movement. States now moving up to under her right breast. The pain is constant, dull and achy. She feels like it contracts/stretches almost like cramping. Slight diarrhea on and off but that's been going for a week or so. Nothing constant. Very little cough. Occasional sob with the pain shifting over to her right side. No longer with gallbladder. No heartburn. Urine culture was negative 11/28/20 there was calcium ox as well as yeast and leukocytes in her urine last visit with a small amount of blood.   Did start rash this am not itchy, slightly painful. No burning.   This has now been about six weeks total, pain worsening in the last one week.   1/31 did have yeast in urine, treated with diflucan.   Hypokalemia: completed 10 days of potassium supplementation.  Plts slightly elevated , seeming to increase slowly over the past one year. Pt states doesn't bruise easy.   Past Medical History:  Diagnosis Date   Abdominal pain, right lower quadrant    Acquired cyst of kidney 01/08/2012   pt denies history of    Acute sinusitis, unspecified    Adenomatous colon polyp    Allergy    Angiomyolipoma    kidney   Anxiety    OCCASIONAL   Chest pain    Chest pain, unspecified    Depressive disorder, not elsewhere classified 01/08/2012   not currently    Diverticulosis    Dizziness and giddiness    Dysfunction of eustachian tube    Hemorrhoids    History of  diverticulitis of colon    IBS (irritable bowel syndrome)    Liver lesion 2011   Neoplasm of skin of face    malignant   OA (osteoarthritis)    knee, NECK   Obesity, unspecified    Ostium secundum type atrial septal defect    Other and unspecified hyperlipidemia    Other malaise and fatigue    Palpitations    Shortness of breath    Thyroid disease    Unspecified essential hypertension    pt states she does not have HTN   Unspecified sinusitis (chronic)    Unspecified vitamin D deficiency     Past Surgical History:  Procedure Laterality Date   ABDOMINAL HYSTERECTOMY     BREAST REDUCTION SURGERY  1990   CERVICAL [place]   CHOLECYSTECTOMY     COLONOSCOPY     DILATION AND CURETTAGE OF UTERUS     EYE SURGERY Bilateral 07/30/2017   cataract sx   FLEXIBLE SIGMOIDOSCOPY  01/08/2012   Procedure: FLEXIBLE SIGMOIDOSCOPY;  Surgeon: Lafayette Dragon, MD;  Location: [facility];  Service: Endoscopy;  Laterality: N/A;   HEMORRHOID SURGERY     KNEE ARTHROTOMY     pt states no    LAMINECTOMY N/A 01/31/2017   Procedure: Thoracic Laminectomy and marsupialization of arachnoid cyst;  Surgeon: Herbie Baltimore  Sherwood Gambler, MD;  Location: [facility];  Service: Neurosurgery;  Laterality: N/A;   LEFT OOPHORECTOMY  1976   NECK SURGERY N/A 03/2018   PARTIAL HYSTERECTOMY     right ovary remains   REDUCTION MAMMAPLASTY     TONSILLECTOMY  1972   UPPER GASTROINTESTINAL ENDOSCOPY      Family History  Problem Relation Age of Onset   Hypertension Mother    Diabetes Mother    Cirrhosis Father    Heart disease Father    Hypertension Sister    Diabetes Sister    Heart disease Brother        x 2   Breast cancer Cousin    Diabetes Cousin    Diabetes Other        aunt   Stroke Other        aunt   Colon cancer Neg Hx    Esophageal cancer Neg Hx    Stomach cancer Neg Hx    Rectal cancer Neg Hx     Social History   Socioeconomic History   Marital status: Widowed    Spouse name: Not on  file   Number of children: 2   Years of education: Not on file   Highest education level: Not on file  Occupational History   Occupation: retired  Tobacco Use   Smoking status: Never   Smokeless tobacco: Never  Vaping Use   Vaping Use: Never used  Substance and Sexual Activity   Alcohol use: No   Drug use: No   Sexual activity: Yes  Other Topics Concern   Not on file  Social History Narrative   Daily caffeine    Social Determinants of Health   Financial Resource Strain: Low Risk    Difficulty of Paying Living Expenses: Not hard at all  Food Insecurity: No Food Insecurity   Worried About Charity fundraiser in the Last Year: Never true   [place] in the Last Year: Never true  Transportation Needs: No Transportation Needs   Lack of Transportation (Medical): No   Lack of Transportation (Non-Medical): No  Physical Activity: Sufficiently Active   Days of Exercise per Week: 7 days   Minutes of Exercise per Session: 30 min  Stress: No Stress Concern Present   Feeling of Stress : Not at all  Social Connections: Moderately Integrated   Frequency of Communication with Friends and Family: More than three times a week   Frequency of Social Gatherings with Friends and Family: More than three times a week   Attends Religious Services: More than 4 times per year   Active Member of [employer] or Organizations: Yes   Attends Archivist Meetings: More than 4 times per year   Marital Status: Widowed  Human resources officer Violence: Not At Risk   Fear of Current or Ex-Partner: No   Emotionally Abused: No   Physically Abused: No   Sexually Abused: No    Outpatient Medications Prior to Visit  Medication Sig Dispense Refill   Cholecalciferol (VITAMIN D3) 2000 units TABS Take 2,000 Units by mouth daily.     ezetimibe (ZETIA) 10 MG tablet Take 1 tablet (10 mg total) by mouth daily. 90 tablet 3   fluticasone (FLONASE) 50 MCG/ACT nasal spray Place 1 spray into both nostrils as needed  for allergies.      hydrochlorothiazide (HYDRODIURIL) 25 MG tablet TAKE 1 TABLET BY MOUTH EVERYDAY AT BEDTIME (Patient taking differently: TAKE 1 TABLET BY MOUTH EVERYDAY IN THE AM)  90 tablet 3   levothyroxine (SYNTHROID) 50 MCG tablet Take 1 tablet (50 mcg total) by mouth daily. 30 tablet 1   amoxicillin-clavulanate (AUGMENTIN) 875-125 MG tablet Take 1 tablet by mouth 2 (two) times daily. 20 tablet 0   potassium chloride (KLOR-CON M) 10 MEQ tablet Take 1 tablet (10 mEq total) by mouth daily for 10 days. 10 tablet 0   Facility-Administered Medications Prior to Visit  Medication Dose Route Frequency Provider Last Rate Last Admin   0.9 %  sodium chloride infusion  500 mL Intravenous Once [place], MD        Allergies  Allergen Reactions   Simvastatin Other (See Comments)    MYALGIAS   Statins Other (See Comments)    Has severe leg muscle aches and cramps   Bactrim [Sulfamethoxazole-Trimethoprim] Itching   Sertraline Anxiety    ROS Review of Systems  Review of Systems  Respiratory:  Negative for shortness of breath.   Cardiovascular:  Negative for chest pain and palpitations.  Gastrointestinal:  Negative for constipation and diarrhea.  Genitourinary:  Negative for dysuria, frequency and urgency.  Musculoskeletal:  Negative for myalgias.  Psychiatric/Behavioral:  Negative for depression and suicidal ideas.   All other systems reviewed and are negative.    Objective:    Physical Exam Constitutional:      General: She is not in acute distress.    Appearance: She is obese. She is not ill-appearing, toxic-appearing or diaphoretic.  HENT:     Head: Normocephalic.  Cardiovascular:     Rate and Rhythm: Normal rate and regular rhythm.  Pulmonary:     Effort: Pulmonary effort is normal.     Breath sounds: Normal breath sounds.  Abdominal:     General: Abdomen is flat.     Palpations: Abdomen is soft. There is no mass.     Tenderness: There is abdominal tenderness (ruq  with 2/10 tenderness, rlq with 6/10 tenderness). There is no guarding or rebound.  Musculoskeletal:     Comments: Right flank pain, 8/10 tenderness on percussion   Skin:      Neurological:     Mental Status: She is alert.     BP 128/78    Pulse 72    Ht 5' 4.5" (1.638 m)    Wt 179 lb (81.2 kg)    SpO2 98%    BMI 30.25 kg/m  Wt Readings from Last 3 Encounters:  12/20/21 179 lb (81.2 kg)  11/28/21 179 lb (81.2 kg)  07/05/21 189 lb 9.6 oz (86 kg)     Health Maintenance Due  Topic Date Due   Zoster Vaccines- Shingrix (1 of 2) Never done   Pneumonia Vaccine 55+ Years old (1 - PCV) Never done   TETANUS/TDAP  08/25/2018   COVID-19 Vaccine (3 - Pfizer risk series) 08/13/2020    There are no preventive care reminders to display for this patient.  Lab Results  Component Value Date   TSH 3.35 11/28/2021   Lab Results  Component Value Date   WBC 6.2 11/28/2021   HGB 14.8 11/28/2021   HCT 44.8 11/28/2021   MCV 90.6 11/28/2021   PLT 434.0 (H) 11/28/2021   Lab Results  Component Value Date   NA 142 11/28/2021   K 3.2 (L) 11/28/2021   CO2 30 11/28/2021   GLUCOSE 91 11/28/2021   BUN 9 11/28/2021   CREATININE 0.87 11/28/2021   BILITOT 0.7 11/28/2021   ALKPHOS 72 11/28/2021   AST 15 11/28/2021   ALT  12 11/28/2021   PROT 6.8 11/28/2021   ALBUMIN 4.2 11/28/2021   CALCIUM 10.3 11/28/2021   ANIONGAP 10 05/20/2020   GFR 66.88 11/28/2021   Lab Results  Component Value Date   CHOL 252 (H) 04/07/2021   Lab Results  Component Value Date   HDL 50.70 04/07/2021   Lab Results  Component Value Date   LDLCALC 146 (H) 03/03/2020   Lab Results  Component Value Date   TRIG 223.0 (H) 04/07/2021   Lab Results  Component Value Date   CHOLHDL 5 04/07/2021   Lab Results  Component Value Date   HGBA1C 5.7 03/03/2020      Assessment & Plan:   Problem List Items Addressed This Visit       Genitourinary   Cyst of right kidney   Relevant Orders   Comprehensive  metabolic panel   Angiomyolipoma of right kidney    Repeat ct scan to see if any increase in size.      Benign essential microscopic hematuria    Repeat urine today, pending urine culture repeat as well.       Relevant Orders   Urine Culture   Comprehensive metabolic panel     Other   Right lower quadrant abdominal pain    Pt thinks was removed during hysterectomy but with rlq pain so can not r/o appendicitis as not able to confirm via surgical history and pt unsure.       Relevant Orders   CT Abdomen Pelvis Wo Contrast   Acute right flank pain    Stat ct abd pelvis to r/o kidney stone. Calcium ox in urine last visit.       Relevant Orders   CBC w/Diff   Herpes zoster without complication - Primary   Relevant Medications   valACYclovir (VALTREX) 1000 MG tablet   Other Visit Diagnoses     Rash/skin eruption           Meds ordered this encounter  Medications   valACYclovir (VALTREX) 1000 MG tablet    Sig: Take 1 tablet (1,000 mg total) by mouth 3 (three) times daily for 7 days.    Dispense:  21 tablet    Refill:  0    Order Specific Question:   Supervising Provider    Answer:   BEDSOLE, AMY E [[id number]]    Follow-up: No follow-ups on file.    Eugenia Pancoast, FNP

## 2021-12-21 ENCOUNTER — Other Ambulatory Visit: Payer: Self-pay | Admitting: Family

## 2021-12-21 ENCOUNTER — Encounter: Payer: Self-pay | Admitting: *Deleted

## 2021-12-21 ENCOUNTER — Telehealth: Payer: Self-pay

## 2021-12-21 DIAGNOSIS — N2889 Other specified disorders of kidney and ureter: Secondary | ICD-10-CM | POA: Insufficient documentation

## 2021-12-21 DIAGNOSIS — D1771 Benign lipomatous neoplasm of kidney: Secondary | ICD-10-CM

## 2021-12-21 DIAGNOSIS — N289 Disorder of kidney and ureter, unspecified: Secondary | ICD-10-CM | POA: Insufficient documentation

## 2021-12-21 DIAGNOSIS — K639 Disease of intestine, unspecified: Secondary | ICD-10-CM | POA: Insufficient documentation

## 2021-12-21 DIAGNOSIS — R109 Unspecified abdominal pain: Secondary | ICD-10-CM

## 2021-12-21 DIAGNOSIS — R1011 Right upper quadrant pain: Secondary | ICD-10-CM | POA: Insufficient documentation

## 2021-12-21 LAB — URINE CULTURE
MICRO NUMBER:: 13036996
SPECIMEN QUALITY:: ADEQUATE

## 2021-12-21 NOTE — Telephone Encounter (Signed)
Scheduled for 03/07/2022

## 2021-12-21 NOTE — Progress Notes (Signed)
Patient called and CT results were discussed in detail.  Due to right flank pain and multiple lesions on the kidney I have referred her to urology for ongoing management.  Especially due to questionable 3 mm high density nodule in the left kidney.  Pain has been bad that is waking her up at night so also concerning.  Also in regards to the subtle decreased density in the submucosal region in the ascending colon I have referred her to GI as well.  She is aware of this  Patient also with increasing rash around the right side/ dermatome that is painful and increasing in size.  Patient started on Valtrex yesterday as suspected zoster on physical exam. Noted, she had the right flank pain and right upper abdominal pain prior to the onset of shingles so that has been chronic.

## 2022-01-02 ENCOUNTER — Encounter: Payer: Self-pay | Admitting: Family

## 2022-01-02 DIAGNOSIS — B0229 Other postherpetic nervous system involvement: Secondary | ICD-10-CM

## 2022-01-02 MED ORDER — GABAPENTIN 100 MG PO CAPS: ORAL_CAPSULE | ORAL | 0 refills | Status: DC

## 2022-01-12 ENCOUNTER — Encounter: Payer: Self-pay | Admitting: Family

## 2022-01-13 ENCOUNTER — Other Ambulatory Visit: Payer: Self-pay

## 2022-01-13 ENCOUNTER — Encounter: Payer: Self-pay | Admitting: Family Medicine

## 2022-01-13 ENCOUNTER — Ambulatory Visit: Payer: Medicare PPO | Admitting: Family Medicine

## 2022-01-13 VITALS — BP 124/62 | HR 79 | Temp 98.3°F | Ht 64.5 in | Wt 180.2 lb

## 2022-01-13 DIAGNOSIS — N2889 Other specified disorders of kidney and ureter: Secondary | ICD-10-CM

## 2022-01-13 DIAGNOSIS — M545 Low back pain, unspecified: Secondary | ICD-10-CM | POA: Diagnosis not present

## 2022-01-13 DIAGNOSIS — B0229 Other postherpetic nervous system involvement: Secondary | ICD-10-CM

## 2022-01-13 DIAGNOSIS — B029 Zoster without complications: Secondary | ICD-10-CM | POA: Diagnosis not present

## 2022-01-13 DIAGNOSIS — R8290 Unspecified abnormal findings in urine: Secondary | ICD-10-CM | POA: Insufficient documentation

## 2022-01-13 DIAGNOSIS — R109 Unspecified abdominal pain: Secondary | ICD-10-CM | POA: Diagnosis not present

## 2022-01-13 LAB — POC URINALSYSI DIPSTICK (AUTOMATED)
Bilirubin, UA: POSITIVE
Blood, UA: POSITIVE
Glucose, UA: NEGATIVE
Ketones, UA: NEGATIVE
Leukocytes, UA: NEGATIVE
Nitrite, UA: NEGATIVE
Protein, UA: POSITIVE — AB
Spec Grav, UA: 1.02 (ref 1.010–1.025)
Urobilinogen, UA: 0.2 E.U./dL
pH, UA: 6 (ref 5.0–8.0)

## 2022-01-13 MED ORDER — GABAPENTIN 100 MG PO CAPS: ORAL_CAPSULE | ORAL | 0 refills | Status: DC

## 2022-01-13 MED ORDER — LIDOCAINE 4 % EX PTCH: MEDICATED_PATCH | CUTANEOUS | 0 refills | Status: DC

## 2022-01-13 NOTE — Assessment & Plan Note (Signed)
Microscopic blood was noted on urinalysis today ?On review of the record this may actually be chronic ?Culture was ordered however to rule out infection  ?Instructed patient to watch for urinary symptoms or fever ?

## 2022-01-13 NOTE — Patient Instructions (Addendum)
Go up on the gabapentin gradually  ? ?Start with 100  morning , 100 mid day then 300 mg at night (that is 3 pills)  ?Watch for dizziness/sedation- use caution  ? ?Then if doing well after 3-4 days can try to go up on the mid day dose to 300 ?Keep [place] posted  ? ?Let's check a urinalysis today  ?We will update you  ? ?Keep the urology appointment  ? ?Please let us know how you are doing Monday  ? ?

## 2022-01-13 NOTE — Assessment & Plan Note (Signed)
Rash is resolving but pain remains ?Px lidoderm patch ?Also inc gabapentin  ?

## 2022-01-13 NOTE — Assessment & Plan Note (Signed)
Record reviewed ?[place] urology visit  ?

## 2022-01-13 NOTE — Assessment & Plan Note (Signed)
R flank and upper abd pain is consistent with post herpetic neuropathy  ?Renal stone is less likely since none seen on recent CT  ?Records and CT reviewed  ?Ua has blood on dip today/? If this is chronic so culture sent as well  ?Px lidoderm patch to use as directed  ?Plan to increase gabapentin to 300 mg at bedtime, keeping the a.m. and midday dose to 100 to start ?Discussed possible side effects including dizziness and sedation and she will watch for this closely ?Plan follow-up with PCP ?Update if not starting to improve in a week or if worsening   ?

## 2022-01-13 NOTE — Progress Notes (Signed)
? ?Subjective:  ? ? Patient ID: Nicole Daniels, female    DOB: Aug 16, 1951, 71 y.o.   MRN: [id number] ? ?This visit occurred during the SARS-CoV-2 public health emergency.  Safety protocols were in place, including screening questions prior to the visit, additional usage of staff PPE, and extensive cleaning of exam room while observing appropriate contact time as indicated for disinfecting solutions.  ? ?HPI ?71 yo pt of NP Dugal presents with side pain  ? ?Wt Readings from Last 3 Encounters:  ?01/13/22 180 lb 3.2 oz (81.7 kg)  ?12/20/21 179 lb (81.2 kg)  ?11/28/21 179 lb (81.2 kg)  ? ?30.45 kg/m? ? ? ?R side/flank pain is more intense  ?Hurts under R breast also  ?Has healing zoster -rash looks much better  ?Finished valtrex  ?No fever ?No chills  ?No vomiting  ? ?Pain is often severe and makes it hard to sleep  ?Her R kidney area is where pain radiates from  ?Tender to the touch  ? ? ?No urinary symptoms at all  ? ? ?Chart reviewed today  ?Pt has acute R sided pain in January and feb  ? ?Then developed shingles which may have been cause  ? ?GI and urology referrals were done  ? ? ?Had CT of abd and pelvis in feb ? ? ?CT Abdomen Pelvis Wo Contrast (Accession [id number]) (Order [id number]) ?Imaging ?Date: 12/20/2021 Department: [facility] CT IMAGING Released By: Verita Schneiders Authorizing: Eugenia Pancoast, [place]  ? ?Exam Status ? ?Status  ?Final [99]  ? ?PACS Intelerad Image Link ? ? Show images for CT Abdomen Pelvis Wo Contrast ? ?Study Result ? ?Narrative & Impression  ?CLINICAL DATA:  Right flank pain ?  ?EXAM: ?CT ABDOMEN AND PELVIS WITHOUT CONTRAST ?  ?TECHNIQUE: ?Multidetector CT imaging of the abdomen and pelvis was performed ?following the standard protocol without IV contrast. ?  ?RADIATION DOSE REDUCTION: This exam was performed according to the ?departmental dose-optimization program which includes automated ?exposure control, adjustment of the mA and/or kV according to ?patient  size and/or use of iterative reconstruction technique. ?  ?COMPARISON:  12/24/2009 ?  ?FINDINGS: ?Lower chest: Unremarkable. ?  ?Hepatobiliary: Surgical clips are seen in gallbladder fossa. There ?is no dilation of bile ducts. ?  ?Pancreas: No focal abnormality is seen. ?  ?Spleen: Unremarkable. ?  ?Adrenals/Urinary Tract: Adrenals are unremarkable. There is no ?hydronephrosis. There are no renal or ureteral stones. Urinary ?bladder is not distended. There is 1.6 cm smooth marginated ?exophytic lesion in the midportion of right kidney which has ?increased in size. There is 6 mm fatty lesion in the upper pole of ?right kidney. There is 11 mm exophytic smooth marginated lesion in ?the posterior upper pole of left kidney which has decreased in size. ?In the image 29 of series 2, there is questionable 3 mm high density ?nodule in the lateral margin of midportion of left kidney. ?  ?Stomach/Bowel: Stomach is not distended. Small bowel loops are not ?dilated. Appendix is not seen. There is no pericecal inflammation. ?Multiple diverticula are seen in colon without signs of focal ?diverticulitis. There is subtle decreased density in the submucosal ?region in the ascending colon. There is no pericolic stranding or ?fluid collection. ?  ?Vascular/Lymphatic: There are scattered arterial calcifications. ?  ?Reproductive: Uterus is not seen. ?  ?Other: There is no ascites or pneumoperitoneum. Left inguinal hernia ?containing fat is seen. ?  ?Musculoskeletal: There is encroachment of right neural foramina at ?L5-S1 level by bony  spurs. ?  ?IMPRESSION: ?There is no evidence of intestinal obstruction or pneumoperitoneum. ?There is no hydronephrosis. ?  ?Diverticulosis of colon without signs of focal diverticulitis. There ?is fat attenuation in the submucosal region of right colon which may ?be a normal variation due to incomplete distention or suggest ?chronic nonspecific inflammation. ?  ?There is 6 mm fat attenuation lesion in  the upper pole of right ?kidney, possibly angiomyolipoma. There are few small exophytic ?lesions in both kidneys 1 of which has increased in size and another ?has decreased in size. Density measurements are higher than usual ?for simple cysts. Follow-up renal sonogram may be considered. ?  ?Other findings as described in the body of the report. ?  ?Has appt with urology in may  ? ?Lab Results  ?Component Value Date  ? WBC 6.4 12/20/2021  ? HGB 15.3 (H) 12/20/2021  ? HCT 45.9 12/20/2021  ? MCV 91.4 12/20/2021  ? PLT 351.0 12/20/2021  ? ?Lab Results  ?Component Value Date  ? CREATININE 0.87 12/20/2021  ? BUN 16 12/20/2021  ? NA 140 12/20/2021  ? K 3.4 (L) 12/20/2021  ? CL 100 12/20/2021  ? CO2 36 (H) 12/20/2021  ? ?Patient Active Problem List  ? Diagnosis Date Noted  ? Abnormal finding on urinalysis 01/13/2022  ? Right upper quadrant abdominal pain 12/21/2021  ? Colon abnormality 12/21/2021  ? Left kidney mass 12/21/2021  ? Kidney lesion, native, bilateral 12/21/2021  ? Right flank pain 12/20/2021  ? Cyst of right kidney 12/20/2021  ? Angiomyolipoma of right kidney 12/20/2021  ? Benign essential microscopic hematuria 12/20/2021  ? Herpes zoster without complication 76/19/5093  ? Acute right-sided low back pain without sciatica 11/28/2021  ? Hypothyroidism 11/28/2021  ? Osteopenia 10/06/2021  ? Anxiety 04/10/2019  ? GERD (gastroesophageal reflux disease) 11/21/2017  ? Hypokalemia 05/14/2017  ? Intradural arachnoid cyst of spine 01/31/2017  ? Essential hypertension 12/06/2010  ? MIGRAINE, CHRONIC 11/24/2010  ? Vitamin D deficiency 04/08/2010  ? DEPRESSION, MILD 03/09/2010  ? RENAL CYST 12/22/2009  ? Right lower quadrant abdominal pain 12/20/2009  ? Obesity (BMI 30.0-34.9) 08/11/2008  ? NEOPLASM, MALIGNANT, SKIN, FACE 12/01/2007  ? PATENT FORAMEN OVALE 01/28/2005  ? Mixed hyperlipidemia 03/30/2001  ? ?Past Medical History:  ?Diagnosis Date  ? Abdominal pain, right lower quadrant   ? Acquired cyst of kidney 01/08/2012  ?  pt denies history of   ? Acute sinusitis, unspecified   ? Adenomatous colon polyp   ? Allergy   ? Angiomyolipoma   ? kidney  ? Anxiety   ? OCCASIONAL  ? Chest pain   ? Chest pain, unspecified   ? Depressive disorder, not elsewhere classified 01/08/2012  ? not currently   ? Diverticulosis   ? Dizziness and giddiness   ? Dysfunction of eustachian tube   ? Hemorrhoids   ? History of diverticulitis of colon   ? IBS (irritable bowel syndrome)   ? Liver lesion 2011  ? Neoplasm of skin of face   ? malignant  ? OA (osteoarthritis)   ? knee, NECK  ? Obesity, unspecified   ? Ostium secundum type atrial septal defect   ? Other and unspecified hyperlipidemia   ? Other malaise and fatigue   ? Palpitations   ? Shortness of breath   ? Thyroid disease   ? Unspecified essential hypertension   ? pt states she does not have HTN  ? Unspecified sinusitis (chronic)   ? Unspecified vitamin D deficiency   ? ?Past  Surgical History:  ?Procedure Laterality Date  ? ABDOMINAL HYSTERECTOMY    ? BREAST REDUCTION SURGERY  1990  ? CERVICAL DISC SURGERY    ? Dr.Nudleman  ? CHOLECYSTECTOMY    ? COLONOSCOPY    ? DILATION AND CURETTAGE OF UTERUS    ? EYE SURGERY Bilateral 07/30/2017  ? cataract sx  ? FLEXIBLE SIGMOIDOSCOPY  01/08/2012  ? Procedure: FLEXIBLE SIGMOIDOSCOPY;  Surgeon: Lafayette Dragon, MD;  Location: [facility];  Service: Endoscopy;  Laterality: N/A;  ? HEMORRHOID SURGERY    ? KNEE ARTHROTOMY    ? pt states no   ? LAMINECTOMY N/A 01/31/2017  ? Procedure: Thoracic Laminectomy and marsupialization of arachnoid cyst;  Surgeon: Jovita Gamma, MD;  Location: [facility];  Service: Neurosurgery;  Laterality: N/A;  ? LEFT OOPHORECTOMY  1976  ? NECK SURGERY N/A 03/2018  ? PARTIAL HYSTERECTOMY    ? right ovary remains  ? REDUCTION MAMMAPLASTY    ? TONSILLECTOMY  1972  ? UPPER GASTROINTESTINAL ENDOSCOPY    ? ?Social History  ? ?Tobacco Use  ? Smoking status: Never  ? Smokeless tobacco: Never  ?Vaping Use  ? Vaping Use: Never used  ?Substance Use Topics   ? Alcohol use: No  ? Drug use: No  ? ?Family History  ?Problem Relation Age of Onset  ? Hypertension Mother   ? Diabetes Mother   ? Cirrhosis Father   ? Heart disease Father   ? Hypertension Sister   ?

## 2022-01-15 LAB — URINE CULTURE
MICRO NUMBER:: 13145892
Result:: NO GROWTH
SPECIMEN QUALITY:: ADEQUATE

## 2022-01-24 DIAGNOSIS — H52223 Regular astigmatism, bilateral: Secondary | ICD-10-CM | POA: Diagnosis not present

## 2022-01-24 DIAGNOSIS — H0288B Meibomian gland dysfunction left eye, upper and lower eyelids: Secondary | ICD-10-CM | POA: Diagnosis not present

## 2022-01-24 DIAGNOSIS — H04123 Dry eye syndrome of bilateral lacrimal glands: Secondary | ICD-10-CM | POA: Diagnosis not present

## 2022-01-24 DIAGNOSIS — Z9842 Cataract extraction status, left eye: Secondary | ICD-10-CM | POA: Diagnosis not present

## 2022-01-24 DIAGNOSIS — H0288A Meibomian gland dysfunction right eye, upper and lower eyelids: Secondary | ICD-10-CM | POA: Diagnosis not present

## 2022-01-24 DIAGNOSIS — Z9841 Cataract extraction status, right eye: Secondary | ICD-10-CM | POA: Diagnosis not present

## 2022-01-25 ENCOUNTER — Other Ambulatory Visit: Payer: Self-pay | Admitting: Family

## 2022-01-25 DIAGNOSIS — E039 Hypothyroidism, unspecified: Secondary | ICD-10-CM

## 2022-01-31 ENCOUNTER — Other Ambulatory Visit: Payer: Self-pay | Admitting: Family

## 2022-01-31 DIAGNOSIS — B0229 Other postherpetic nervous system involvement: Secondary | ICD-10-CM

## 2022-02-16 ENCOUNTER — Encounter: Payer: Self-pay | Admitting: Family

## 2022-02-21 NOTE — Telephone Encounter (Signed)
Made pt appt for 02/22/2022  ?

## 2022-02-22 ENCOUNTER — Ambulatory Visit: Payer: Medicare PPO | Admitting: Family

## 2022-02-22 ENCOUNTER — Telehealth: Payer: Self-pay

## 2022-02-22 ENCOUNTER — Other Ambulatory Visit: Payer: Self-pay

## 2022-02-22 ENCOUNTER — Encounter (HOSPITAL_BASED_OUTPATIENT_CLINIC_OR_DEPARTMENT_OTHER): Payer: Self-pay

## 2022-02-22 ENCOUNTER — Emergency Department (HOSPITAL_BASED_OUTPATIENT_CLINIC_OR_DEPARTMENT_OTHER)
Admission: EM | Admit: 2022-02-22 | Discharge: 2022-02-22 | Disposition: A | Discharge status: Home / Self Care | Payer: Medicare PPO | Attending: Emergency Medicine | Admitting: Emergency Medicine

## 2022-02-22 ENCOUNTER — Telehealth: Payer: Self-pay | Admitting: Nurse Practitioner

## 2022-02-22 VITALS — BP 126/78 | HR 73 | Temp 98.2°F | Ht 64.5 in | Wt 179.0 lb

## 2022-02-22 DIAGNOSIS — D582 Other hemoglobinopathies: Secondary | ICD-10-CM | POA: Diagnosis not present

## 2022-02-22 DIAGNOSIS — R9431 Abnormal electrocardiogram [ECG] [EKG]: Secondary | ICD-10-CM | POA: Diagnosis not present

## 2022-02-22 DIAGNOSIS — R5383 Other fatigue: Secondary | ICD-10-CM | POA: Insufficient documentation

## 2022-02-22 DIAGNOSIS — Z79899 Other long term (current) drug therapy: Secondary | ICD-10-CM | POA: Diagnosis not present

## 2022-02-22 DIAGNOSIS — I10 Essential (primary) hypertension: Secondary | ICD-10-CM | POA: Diagnosis not present

## 2022-02-22 DIAGNOSIS — R1013 Epigastric pain: Secondary | ICD-10-CM | POA: Diagnosis not present

## 2022-02-22 DIAGNOSIS — R12 Heartburn: Secondary | ICD-10-CM | POA: Insufficient documentation

## 2022-02-22 DIAGNOSIS — R110 Nausea: Secondary | ICD-10-CM | POA: Diagnosis not present

## 2022-02-22 DIAGNOSIS — E039 Hypothyroidism, unspecified: Secondary | ICD-10-CM

## 2022-02-22 DIAGNOSIS — E876 Hypokalemia: Secondary | ICD-10-CM

## 2022-02-22 DIAGNOSIS — R109 Unspecified abdominal pain: Secondary | ICD-10-CM | POA: Diagnosis not present

## 2022-02-22 DIAGNOSIS — R1011 Right upper quadrant pain: Secondary | ICD-10-CM | POA: Diagnosis not present

## 2022-02-22 DIAGNOSIS — K30 Functional dyspepsia: Secondary | ICD-10-CM | POA: Diagnosis not present

## 2022-02-22 DIAGNOSIS — B0229 Other postherpetic nervous system involvement: Secondary | ICD-10-CM | POA: Diagnosis not present

## 2022-02-22 LAB — B12 AND FOLATE PANEL
Folate: 10.8 ng/mL (ref >5.9)
Vitamin B-12: 188 pg/mL — ABNORMAL LOW (ref 211–911)

## 2022-02-22 LAB — COMPREHENSIVE METABOLIC PANEL
ALT: 12 U/L (ref 0–35)
ALT: 13 U/L (ref 0–44)
AST: 16 U/L (ref 0–37)
AST: 17 U/L (ref 15–41)
Albumin: 4 g/dL (ref 3.5–5.2)
Albumin: 4.4 g/dL (ref 3.5–5.0)
Alkaline Phosphatase: 69 U/L (ref 39–117)
Alkaline Phosphatase: 70 U/L (ref 38–126)
Anion gap: 11 (ref 5–15)
BUN: 11 mg/dL (ref 6–23)
BUN: 13 mg/dL (ref 8–23)
CO2: 28 mEq/L (ref 19–32)
CO2: 27 mmol/L (ref 22–32)
Calcium: 9.6 mg/dL (ref 8.4–10.5)
Calcium: 10.1 mg/dL (ref 8.9–10.3)
Chloride: 102 mEq/L (ref 96–112)
Chloride: 102 mmol/L (ref 98–111)
Creatinine, Ser: 0.78 mg/dL (ref 0.40–1.20)
Creatinine, Ser: 0.79 mg/dL (ref 0.44–1.00)
GFR, Estimated: >60 mL/min (ref >60)
GFR: 76.11 mL/min (ref >60.00)
Glucose, Bld: 99 mg/dL (ref 70–99)
Glucose, Bld: 96 mg/dL (ref 70–99)
Potassium: 2.8 mEq/L — CL (ref 3.5–5.1)
Potassium: 3 mmol/L — ABNORMAL LOW (ref 3.5–5.1)
Sodium: 141 mEq/L (ref 135–145)
Sodium: 140 mmol/L (ref 135–145)
Total Bilirubin: 0.7 mg/dL (ref 0.2–1.2)
Total Bilirubin: 0.7 mg/dL (ref 0.3–1.2)
Total Protein: 6.8 g/dL (ref 6.0–8.3)
Total Protein: 7.5 g/dL (ref 6.5–8.1)

## 2022-02-22 LAB — POC URINALSYSI DIPSTICK (AUTOMATED)
Glucose, UA: NEGATIVE
Ketones, UA: POSITIVE
Nitrite, UA: NEGATIVE
Protein, UA: POSITIVE — AB
Spec Grav, UA: 1.02 (ref 1.010–1.025)
Urobilinogen, UA: NEGATIVE E.U./dL — AB
pH, UA: 6 (ref 5.0–8.0)

## 2022-02-22 LAB — CBC WITH DIFFERENTIAL/PLATELET
Abs Immature Granulocytes: 0.01 10*3/uL (ref 0.00–0.07)
Basophils Absolute: 0 10*3/uL (ref 0.0–0.1)
Basophils Absolute: 0 10*3/uL (ref 0.0–0.1)
Basophils Relative: 0.4 % (ref 0.0–3.0)
Basophils Relative: 0 %
Eosinophils Absolute: 0.2 10*3/uL (ref 0.0–0.7)
Eosinophils Absolute: 0.2 10*3/uL (ref 0.0–0.5)
Eosinophils Relative: 3.1 % (ref 0.0–5.0)
Eosinophils Relative: 3 %
HCT: 41.8 % (ref 36.0–46.0)
HCT: 42.7 % (ref 36.0–46.0)
Hemoglobin: 14.3 g/dL (ref 12.0–15.0)
Hemoglobin: 14.6 g/dL (ref 12.0–15.0)
Immature Granulocytes: 0 %
Lymphocytes Relative: 30 % (ref 12.0–46.0)
Lymphocytes Relative: 37 %
Lymphs Abs: 1.8 10*3/uL (ref 0.7–4.0)
Lymphs Abs: 2.7 10*3/uL (ref 0.7–4.0)
MCH: 30.8 pg (ref 26.0–34.0)
MCHC: 34.2 g/dL (ref 30.0–36.0)
MCHC: 34.2 g/dL (ref 30.0–36.0)
MCV: 92 fl (ref 78.0–100.0)
MCV: 90.1 fL (ref 80.0–100.0)
Monocytes Absolute: 0.4 10*3/uL (ref 0.1–1.0)
Monocytes Absolute: 0.7 10*3/uL (ref 0.1–1.0)
Monocytes Relative: 7.7 % (ref 3.0–12.0)
Monocytes Relative: 10 %
Neutro Abs: 3.4 10*3/uL (ref 1.4–7.7)
Neutro Abs: 3.8 10*3/uL (ref 1.7–7.7)
Neutrophils Relative %: 58.8 % (ref 43.0–77.0)
Neutrophils Relative %: 50 %
Platelets: 383 10*3/uL (ref 150.0–400.0)
Platelets: 409 10*3/uL — ABNORMAL HIGH (ref 150–400)
RBC: 4.54 Mil/uL (ref 3.87–5.11)
RBC: 4.74 MIL/uL (ref 3.87–5.11)
RDW: 13.3 % (ref 11.5–15.5)
RDW: 12.7 % (ref 11.5–15.5)
WBC: 5.8 10*3/uL (ref 4.0–10.5)
WBC: 7.5 10*3/uL (ref 4.0–10.5)
nRBC: 0 % (ref 0.0–0.2)

## 2022-02-22 LAB — MAGNESIUM: Magnesium: 2.3 mg/dL (ref 1.7–2.4)

## 2022-02-22 LAB — TSH: TSH: 2.92 u[IU]/mL (ref 0.35–5.50)

## 2022-02-22 MED ORDER — PREGABALIN 75 MG PO CAPS: 75.0000 mg | ORAL_CAPSULE | Freq: Two times a day (BID) | ORAL | 2 refills | Status: DC

## 2022-02-22 MED ORDER — POTASSIUM CHLORIDE CRYS ER 20 MEQ PO TBCR
40.0000 meq | EXTENDED_RELEASE_TABLET | Freq: Once | ORAL | Status: AC
  Administered 2022-02-22: 40 meq via ORAL
  Filled 2022-02-22: qty 2

## 2022-02-22 MED ORDER — POTASSIUM CHLORIDE 10 MEQ/100ML IV SOLN
10.0000 meq | Freq: Once | INTRAVENOUS | Status: AC
Start: 2022-02-22 — End: 2022-02-22
  Administered 2022-02-22: 10 meq via INTRAVENOUS
  Filled 2022-02-22: qty 100

## 2022-02-22 MED ORDER — OMEPRAZOLE 20 MG PO CPDR
20.0000 mg | DELAYED_RELEASE_CAPSULE | Freq: Every day | ORAL | 3 refills | Status: DC
Start: 2022-02-22 — End: 2022-05-29

## 2022-02-22 NOTE — Assessment & Plan Note (Signed)
Repeat K today pending results ?Pt on HCTZ which can continue to decrease ?

## 2022-02-22 NOTE — Progress Notes (Signed)
? ?Established Patient Office Visit ? ?Subjective:  ?Patient ID: Nicole Daniels, female    DOB: [birth year]  Age: 71 y.o. MRN: [id number] ? ?CC:  ?Chief Complaint  ?Patient presents with  ? Follow-up  ?  Shingles; doing better but still having some discomfort with pain and skin is sensitive in the area  ? ? ?HPI ?Nicole Daniels is here today for follow up.  ?Pt is without acute concerns. ? ?2/21 was seen in office, for shingles rash, tx with valtrex tid x 7 days  ?Developed phn, started on gabapentin ? ?Describes pain that is right under the breast that goes around the right upper back. Hurts to sit at times, hurts to put/place clothes on the site, feels like burning/crawling all over her. 3/6 started her on gabapentin 100 mg at night, but pain increased, so saw Dr. Glori Bickers in office 3/17 and pt started 100 mg qam, at lunch, and 300 mg at night. This was helping slightly with some improvement however has since titrated down bc didn't want to take medication forever, however she feels the pain even more so off of it.  ? ?She notes increased fatigue, and hard to get herself motivated to do anything.  ?Denies urinary symptoms. Weight is stable. Does have thinning hair/balding spot at top of scalp. Does have dry skin but uses lotion daily.  ? ?Wt Readings from Last 3 Encounters:  ?02/22/22 179 lb (81.2 kg)  ?01/13/22 180 lb 3.2 oz (81.7 kg)  ?12/20/21 179 lb (81.2 kg)  ? ? ?Past Medical History:  ?Diagnosis Date  ? Abdominal pain, right lower quadrant   ? Acquired cyst of kidney 01/08/2012  ? pt denies history of   ? Acute sinusitis, unspecified   ? Adenomatous colon polyp   ? Allergy   ? Angiomyolipoma   ? kidney  ? Anxiety   ? OCCASIONAL  ? Chest pain   ? Chest pain, unspecified   ? Depressive disorder, not elsewhere classified 01/08/2012  ? not currently   ? Diverticulosis   ? Dizziness and giddiness   ? Dysfunction of eustachian tube   ? Hemorrhoids   ? History of diverticulitis of colon   ? IBS (irritable  bowel syndrome)   ? Liver lesion 2011  ? Neoplasm of skin of face   ? malignant  ? OA (osteoarthritis)   ? knee, NECK  ? Obesity, unspecified   ? Ostium secundum type atrial septal defect   ? Other and unspecified hyperlipidemia   ? Other malaise and fatigue   ? Palpitations   ? Shortness of breath   ? Thyroid disease   ? Unspecified essential hypertension   ? pt states she does not have HTN  ? Unspecified sinusitis (chronic)   ? Unspecified vitamin D deficiency   ? ? ?Past Surgical History:  ?Procedure Laterality Date  ? ABDOMINAL HYSTERECTOMY    ? BREAST REDUCTION SURGERY  1990  ? CERVICAL DISC SURGERY    ? Dr.Nudleman  ? CHOLECYSTECTOMY    ? COLONOSCOPY    ? DILATION AND CURETTAGE OF UTERUS    ? EYE SURGERY Bilateral 07/30/2017  ? cataract sx  ? FLEXIBLE SIGMOIDOSCOPY  01/08/2012  ? Procedure: FLEXIBLE SIGMOIDOSCOPY;  Surgeon: Lafayette Dragon, MD;  Location: [facility];  Service: Endoscopy;  Laterality: N/A;  ? HEMORRHOID SURGERY    ? KNEE ARTHROTOMY    ? pt states no   ? LAMINECTOMY N/A 01/31/2017  ? Procedure: Thoracic Laminectomy and marsupialization of arachnoid  cyst;  Surgeon: Jovita Gamma, MD;  Location: [facility];  Service: Neurosurgery;  Laterality: N/A;  ? LEFT OOPHORECTOMY  1976  ? NECK SURGERY N/A 03/2018  ? PARTIAL HYSTERECTOMY    ? right ovary remains  ? REDUCTION MAMMAPLASTY    ? TONSILLECTOMY  1972  ? UPPER GASTROINTESTINAL ENDOSCOPY    ? ? ?Family History  ?Problem Relation Age of Onset  ? Hypertension Mother   ? Diabetes Mother   ? Cirrhosis Father   ? Heart disease Father   ? Hypertension Sister   ? Diabetes Sister   ? Heart disease Brother   ?     x 2  ? Breast cancer Cousin   ? Diabetes Cousin   ? Diabetes Other   ?     aunt  ? Stroke Other   ?     aunt  ? Colon cancer Neg Hx   ? Esophageal cancer Neg Hx   ? Stomach cancer Neg Hx   ? Rectal cancer Neg Hx   ? ? ?Social History  ? ?Socioeconomic History  ? Marital status: Widowed  ?  Spouse name: Not on file  ? Number of children: 2  ? Years of  education: Not on file  ? Highest education level: Not on file  ?Occupational History  ? Occupation: retired  ?Tobacco Use  ? Smoking status: Never  ? Smokeless tobacco: Never  ?Vaping Use  ? Vaping Use: Never used  ?Substance and Sexual Activity  ? Alcohol use: No  ? Drug use: No  ? Sexual activity: Yes  ?Other Topics Concern  ? Not on file  ?Social History Narrative  ? Daily caffeine   ? ?Social Determinants of Health  ? ?Financial Resource Strain: Low Risk   ? Difficulty of Paying Living Expenses: Not hard at all  ?Food Insecurity: No Food Insecurity  ? Worried About Charity fundraiser in the Last Year: Never true  ? Ran Out of Food in the Last Year: Never true  ?Transportation Needs: No Transportation Needs  ? Lack of Transportation (Medical): No  ? Lack of Transportation (Non-Medical): No  ?Physical Activity: Sufficiently Active  ? Days of Exercise per Week: 7 days  ? Minutes of Exercise per Session: 30 min  ?Stress: No Stress Concern Present  ? Feeling of Stress : Not at all  ?Social Connections: Moderately Integrated  ? Frequency of Communication with Friends and Family: More than three times a week  ? Frequency of Social Gatherings with Friends and Family: More than three times a week  ? Attends Religious Services: More than 4 times per year  ? Active Member of Clubs or Organizations: Yes  ? Attends Archivist Meetings: More than 4 times per year  ? Marital Status: Widowed  ?Intimate Partner Violence: Not At Risk  ? Fear of Current or Ex-Partner: No  ? Emotionally Abused: No  ? Physically Abused: No  ? Sexually Abused: No  ? ? ?Outpatient Medications Prior to Visit  ?Medication Sig Dispense Refill  ? Cholecalciferol (VITAMIN D-3) 125 MCG (5000 UT) TABS Take by mouth every other day.    ? Cholecalciferol (VITAMIN D3) 2000 units TABS Take 2,000 Units by mouth daily.    ? ezetimibe (ZETIA) 10 MG tablet Take 1 tablet (10 mg total) by mouth daily. 90 tablet 3  ? fluticasone (FLONASE) 50 MCG/ACT nasal  spray Place 1 spray into both nostrils as needed for allergies.     ? hydrochlorothiazide (HYDRODIURIL) 25  MG tablet TAKE 1 TABLET BY MOUTH EVERYDAY AT BEDTIME (Patient taking differently: TAKE 1 TABLET BY MOUTH EVERYDAY IN THE AM) 90 tablet 3  ? levothyroxine (SYNTHROID) 50 MCG tablet TAKE 1 TABLET BY MOUTH EVERY DAY 90 tablet 0  ? Lidocaine (HM LIDOCAINE PATCH) 4 % PTCH Use one patch on affected area for 12 hours , then remove for 12 hours as needed 20 patch 0  ? gabapentin (NEURONTIN) 100 MG capsule take one capsule po qam, one capsule po at lunch, and three capsules po qhs for neuralgia 120 capsule 0  ? ?Facility-Administered Medications Prior to Visit  ?Medication Dose Route Frequency Provider Last Rate Last Admin  ? 0.9 %  sodium chloride infusion  500 mL Intravenous Once Thornton Park, MD      ? ? ?Allergies  ?Allergen Reactions  ? Simvastatin Other (See Comments)  ?  MYALGIAS  ? Statins Other (See Comments)  ?  Has severe leg muscle aches and cramps  ? Bactrim [Sulfamethoxazole-Trimethoprim] Itching  ? Sertraline Anxiety  ? ? ?ROS ?Review of Systems  ?Constitutional:  Positive for fatigue and unexpected weight change (hard to lose weight). Negative for chills.  ?     Lack of motivation and energy ?  ?Respiratory:  Negative for cough and shortness of breath.   ?Cardiovascular:  Negative for chest pain and leg swelling.  ?Gastrointestinal:  Negative for abdominal pain, blood in stool, constipation, diarrhea, nausea and vomiting.  ?     Some acid reflux/heartburn  ?Genitourinary:  Negative for difficulty urinating.  ?Neurological:   ?     Nerve burning stinging pain below right breast as well radiating around to right flank/upper mid back area  ?Psychiatric/Behavioral:  Negative for agitation and sleep disturbance.   ?All other systems reviewed and are negative. ? ? ? ?  ?Objective:  ?  ?Physical Exam ?Vitals reviewed.  ?Constitutional:   ?   General: She is not in acute distress. ?   Appearance: Normal  appearance. She is obese. She is not ill-appearing, toxic-appearing or diaphoretic.  ?HENT:  ?   Mouth/Throat:  ?   Pharynx: No pharyngeal swelling.  ?   Tonsils: No tonsillar exudate.  ?Neck:  ?   Thyroid:

## 2022-02-22 NOTE — ED Provider Notes (Signed)
?[facility] DEPT ?Provider Note ? ? ?CSN: [id number] ?Arrival date & time: 02/22/22  1709 ? ?  ? ?History ? ?Chief Complaint  ?Patient presents with  ? Abnormal Lab  ? ? ?Nicole Daniels is a 71 y.o. female. ? ?HPI ? ?  ?71yo female with history above presents from PCP with concern for hypokalemai with K 2.8 after she presented with continuing pain from her shingles. ? ?2/23 had shingles, 9th week of pain ( lesions healed  but pain still there) right side lower chest/abdomen wrapping around ?Did bloodwork as outpt to recheck k, told to come to ED due to 2.8 ? ?No dyspnea, abdominal pain ?Dyspnea when going up stairs, thought from shingles, being tired. Palpitations, not today.  No other chest pain. No nausea/vomiting.  Every now and then some nausea. Came back off the gabapentin.  ? ?Past Medical History:  ?Diagnosis Date  ? Abdominal pain, right lower quadrant   ? Acquired cyst of kidney 01/08/2012  ? pt denies history of   ? Acute sinusitis, unspecified   ? Adenomatous colon polyp   ? Allergy   ? Angiomyolipoma   ? kidney  ? Anxiety   ? OCCASIONAL  ? Chest pain   ? Chest pain, unspecified   ? Depressive disorder, not elsewhere classified 01/08/2012  ? not currently   ? Diverticulosis   ? Dizziness and giddiness   ? Dysfunction of eustachian tube   ? Hemorrhoids   ? History of diverticulitis of colon   ? IBS (irritable bowel syndrome)   ? Liver lesion 2011  ? Neoplasm of skin of face   ? malignant  ? OA (osteoarthritis)   ? knee, NECK  ? Obesity, unspecified   ? Ostium secundum type atrial septal defect   ? Other and unspecified hyperlipidemia   ? Other malaise and fatigue   ? Palpitations   ? Shortness of breath   ? Thyroid disease   ? Unspecified essential hypertension   ? pt states she does not have HTN  ? Unspecified sinusitis (chronic)   ? Unspecified vitamin D deficiency   ?  ?Past Surgical History:  ?Procedure Laterality Date  ? ABDOMINAL HYSTERECTOMY    ? BREAST REDUCTION SURGERY   1990  ? CERVICAL DISC SURGERY    ? Dr.Nudleman  ? CHOLECYSTECTOMY    ? COLONOSCOPY    ? DILATION AND CURETTAGE OF UTERUS    ? EYE SURGERY Bilateral 07/30/2017  ? cataract sx  ? FLEXIBLE SIGMOIDOSCOPY  01/08/2012  ? Procedure: FLEXIBLE SIGMOIDOSCOPY;  Surgeon: Lafayette Dragon, MD;  Location: [facility];  Service: Endoscopy;  Laterality: N/A;  ? HEMORRHOID SURGERY    ? KNEE ARTHROTOMY    ? pt states no   ? LAMINECTOMY N/A 01/31/2017  ? Procedure: Thoracic Laminectomy and marsupialization of arachnoid cyst;  Surgeon: Jovita Gamma, MD;  Location: [facility];  Service: Neurosurgery;  Laterality: N/A;  ? LEFT OOPHORECTOMY  1976  ? NECK SURGERY N/A 03/2018  ? PARTIAL HYSTERECTOMY    ? right ovary remains  ? REDUCTION MAMMAPLASTY    ? TONSILLECTOMY  1972  ? UPPER GASTROINTESTINAL ENDOSCOPY    ?  ?Home Medications ?Prior to Admission medications   ?Medication Sig Start Date End Date Taking? Authorizing Provider  ?Cholecalciferol (VITAMIN D-3) 125 MCG (5000 UT) TABS Take by mouth every other day.    [provider]  ?Cholecalciferol (VITAMIN D3) 2000 units TABS Take 2,000 Units by mouth daily.    [provider]  ?ezetimibe (ZETIA) 10 MG tablet Take 1 tablet (10 mg total) by mouth daily. 06/01/21   Cirigliano, Garvin Fila, DO  ?fluticasone (FLONASE) 50 MCG/ACT nasal spray Place 1 spray into both nostrils as needed for allergies.     [provider]  ?hydrochlorothiazide (HYDRODIURIL) 25 MG tablet TAKE 1 TABLET BY MOUTH EVERYDAY AT BEDTIME ?Patient taking differently: TAKE 1 TABLET BY MOUTH EVERYDAY IN THE AM 04/07/21   Ronnald Nian, DO  ?levothyroxine (SYNTHROID) 50 MCG tablet TAKE 1 TABLET BY MOUTH EVERY DAY 01/25/22 04/25/22  Eugenia Pancoast, FNP  ?Lidocaine (HM LIDOCAINE PATCH) 4 % PTCH Use one patch on affected area for 12 hours , then remove for 12 hours as needed 01/13/22   Tower, Wynelle Fanny, MD  ?omeprazole (PRILOSEC) 20 MG capsule Take 1 capsule (20 mg total) by mouth daily. 02/22/22   Eugenia Pancoast,  FNP  ?pregabalin (LYRICA) 75 MG capsule Take 1 capsule (75 mg total) by mouth 2 (two) times daily. 02/22/22 05/23/22  Eugenia Pancoast, FNP  ?   ? ?Allergies    ?Simvastatin, Statins, Bactrim [sulfamethoxazole-trimethoprim], and Sertraline   ? ?Review of Systems   ?Review of Systems ? ?Physical Exam ?Updated Vital Signs ?BP (!) 151/89   Pulse 67   Temp 98 ?F (36.7 ?C) (Oral)   Resp 16   Ht 5' 4.5" (1.638 m)   Wt 81.2 kg   SpO2 98%   BMI 30.25 kg/m?  ?Physical Exam ?Vitals and nursing note reviewed.  ?Constitutional:   ?   General: She is not in acute distress. ?   Appearance: She is well-developed. She is not diaphoretic.  ?HENT:  ?   Head: Normocephalic and atraumatic.  ?Eyes:  ?   Conjunctiva/sclera: Conjunctivae normal.  ?Cardiovascular:  ?   Rate and Rhythm: Normal rate and regular rhythm.  ?   Heart sounds: Normal heart sounds. No murmur heard. ?  No friction rub. No gallop.  ?Pulmonary:  ?   Effort: Pulmonary effort is normal. No respiratory distress.  ?   Breath sounds: Normal breath sounds. No wheezing or rales.  ?Abdominal:  ?   General: There is no distension.  ?   Palpations: Abdomen is soft.  ?   Tenderness: There is no abdominal tenderness. There is no guarding.  ?Musculoskeletal:     ?   General: No tenderness.  ?   Cervical back: Normal range of motion.  ?Skin: ?   General: Skin is warm and dry.  ?   Findings: No erythema or rash.  ?Neurological:  ?   Mental Status: She is alert and oriented to person, place, and time.  ? ? ?ED Results / Procedures / Treatments   ?Labs ?(all labs ordered are listed, but only abnormal results are displayed) ?Labs Reviewed  ?COMPREHENSIVE METABOLIC PANEL - Abnormal; Notable for the following components:  ?    Result Value  ? Potassium 3.0 (*)   ? All other components within normal limits  ?CBC WITH DIFFERENTIAL/PLATELET - Abnormal; Notable for the following components:  ? Platelets 409 (*)   ? All other components within normal limits  ?MAGNESIUM  ? ? ?EKG ?EKG  Interpretation ? ?Date/Time:  Wednesday February 22 2022 17:30:32 EDT ?Ventricular Rate:  69 ?PR Interval:  177 ?QRS Duration: 89 ?QT Interval:  387 ?QTC Calculation: 415 ?R Axis:   32 ?Text Interpretation: Sinus rhythm Abnormal R-wave progression, early transition Nonspecific repol abnormality, lateral leads No significant change since last tracing Confirmed by  Gareth Morgan [phone]) on 02/22/2022 6:14:17 PM ? ?Radiology ?No results found. ? ?Procedures ?Procedures  ? ? ?Medications Ordered in ED ?Medications  ?potassium chloride 10 mEq in 100 mL IVPB (0 mEq Intravenous Stopped 02/22/22 1850)  ?potassium chloride SA (KLOR-CON M) CR tablet 40 mEq (40 mEq Oral Given 02/22/22 1737)  ? ? ?ED Course/ Medical Decision Making/ A&P ?  ?                        ?Medical Decision Making ?Amount and/or Complexity of Data Reviewed ?Labs: ordered. ? ?Risk ?Prescription drug management. ? ? ?71yo female with history above presents from PCP with concern for hypokalemai with K 2.8 after she presented with continuing pain from her shingles to PCP. ? ?Location and history of continuing pain since her shingles outbreak is most consistent with post-herpetic neuralgia.  It is not consistent with ACS, PE, dissection, pneumonia, cholangitis.   ? ?Labs obtained here show K 3.0.  Given 36mq IV, 40 meq po.  Stable for outpatient follow up and recheck with PCP in one week.   ? ? ? ? ? ? ? ?Final Clinical Impression(s) / ED Diagnoses ?Final diagnoses:  ?Hypokalemia  ? ? ?Rx / DC Orders ?ED Discharge Orders   ? ? None  ? ?  ? ? ?  ?SGareth Morgan MD ?02/23/22 1053 ? ?

## 2022-02-22 NOTE — Assessment & Plan Note (Signed)
Trial omeprazole ?

## 2022-02-22 NOTE — Telephone Encounter (Signed)
I was notified of critical potassium reading of 2.8. Patient was seen in office today. I asked triage nurse to call patient and review symptoms. If she is having any cardiac symptoms she needs to be seen in the ED for further evaluation and IV potassium replacement.  ?Triage nurse contacted patient and alerted me that she has been having palpitations, chest pain, and shob. Last episode was yesterday. Patient advised to go to ED for further evaluation ? ?CC triage nurse and PCP  ?

## 2022-02-22 NOTE — Assessment & Plan Note (Signed)
Recent dose increase to 88 mcg once daily, repeat tsh pending results ?

## 2022-02-22 NOTE — Assessment & Plan Note (Signed)
Likely more neuralgic pain however will test urine culture and urine poct in office pending results ?

## 2022-02-22 NOTE — Patient Instructions (Addendum)
Stop by the lab prior to leaving today. I will notify you of your results once received.  ? ?Start omeprazole I sent in RX for one month to see if acid reflux improves. ? ?Lets do a trial of pregablin (lyrica). Start 75 mg tonight, and then tomorrow take one in the am and one at night.   ? ?Due to recent changes in healthcare laws, you may see results of your imaging and/or laboratory studies on MyChart before I have had a chance to review them.  I understand that in some cases there may be results that are confusing or concerning to you. Please understand that not all results are received at the same time and often I may need to interpret multiple results in order to provide you with the best plan of care or course of treatment. Therefore, I ask that you please give me 2 business days to thoroughly review all your results before contacting my office for clarification. Should we see a critical lab result, you will be contacted sooner.  ? ?It was a pleasure seeing you today! Please do not hesitate to reach out with any questions and or concerns. ? ?Regards,  ? ?Thorsten Climer ?FNP-C ? ? ?

## 2022-02-22 NOTE — Assessment & Plan Note (Signed)
Will eval b12, cbc, tsh for fatigue ?

## 2022-02-22 NOTE — ED Triage Notes (Signed)
Patient here POV from Home. ? ?Endorses being at PCP Office this AM for Assessment of Shingles Flare-Up. Had Lab Specimens collected for Assessment and was told Potassium was 2.8. Sent for ED Evaluation. ? ?NAD Noted during Triage. A&Ox4. GCS 15. Ambulatory. ?

## 2022-02-22 NOTE — ED Notes (Signed)
Discharge paperwork given and understood. 

## 2022-02-22 NOTE — Assessment & Plan Note (Addendum)
h pylori breath test ordered  ?Trial omeprazole 20 mg once daily for 30 days  ?Try to decrease and or avoid spicy foods, fried fatty foods, and also caffeine and chocolate as these can increase heartburn symptoms.  ? ? ?

## 2022-02-22 NOTE — Telephone Encounter (Signed)
Critical result called from [facility].  Potassium 2.8  ?Given to [place], sent note to [place]. ?

## 2022-02-22 NOTE — Telephone Encounter (Signed)
noted 

## 2022-02-22 NOTE — Telephone Encounter (Signed)
Nicole Garret NP received critical report for K 2.8. I spoke with pt and she has not had CP but evening of 02/21/22 pt had palpitations, irreg heartbeat and SOB upon exertion; not sure how long episode lasted; estimating 15 mins. Pt is going to go to [facility] ED by car. ED precautions given and pt said would take about 30 - 40 mins for pt to get there. I spoke with Nicole Daniels nurse in triage at [facility] to give above in as FYI pt was coming. Sending note to Nicole Garret NP and Nicole Pancoast FNP. ?

## 2022-02-22 NOTE — Assessment & Plan Note (Signed)
Repeat cbc pending results 

## 2022-02-23 ENCOUNTER — Telehealth: Payer: Self-pay

## 2022-02-23 LAB — URINE CULTURE
MICRO NUMBER:: 13315202
SPECIMEN QUALITY:: ADEQUATE

## 2022-02-23 NOTE — Telephone Encounter (Signed)
Called and spoke to pt this am and made an appt for Monday May 1. ?

## 2022-02-23 NOTE — Telephone Encounter (Signed)
Spoke to pt and made an appt for 02/27/2022 for potassium recheck. Also informed her to stop her HCTZ until then.  ?

## 2022-02-23 NOTE — Progress Notes (Signed)
Pt was sent to ER by Romilda Garret NP as I was out of the office.  ?Potassium up to 3, given IVF and potassium.  ?D/w pt at visit low b12, will opt to give injections and have pt start otc ?D/w pt probable to d/c HCTZ

## 2022-02-24 LAB — H. PYLORI BREATH TEST: H. pylori Breath Test: NOT DETECTED

## 2022-02-27 ENCOUNTER — Encounter: Payer: Self-pay | Admitting: Family

## 2022-02-27 ENCOUNTER — Other Ambulatory Visit: Payer: Self-pay | Admitting: Family

## 2022-02-27 ENCOUNTER — Ambulatory Visit: Payer: Medicare PPO | Admitting: Family

## 2022-02-27 VITALS — BP 130/82 | HR 70 | Temp 98.5°F | Resp 16 | Ht 64.5 in | Wt 181.1 lb

## 2022-02-27 DIAGNOSIS — N3000 Acute cystitis without hematuria: Secondary | ICD-10-CM | POA: Diagnosis not present

## 2022-02-27 DIAGNOSIS — E876 Hypokalemia: Secondary | ICD-10-CM

## 2022-02-27 DIAGNOSIS — E538 Deficiency of other specified B group vitamins: Secondary | ICD-10-CM | POA: Diagnosis not present

## 2022-02-27 LAB — POTASSIUM: Potassium: 3.2 mEq/L — ABNORMAL LOW (ref 3.5–5.1)

## 2022-02-27 MED ORDER — AMOXICILLIN-POT CLAVULANATE 875-125 MG PO TABS: 1.0000 | ORAL_TABLET | Freq: Two times a day (BID) | ORAL | 0 refills | Status: DC

## 2022-02-27 MED ORDER — POTASSIUM CHLORIDE CRYS ER 10 MEQ PO TBCR: EXTENDED_RELEASE_TABLET | ORAL | 0 refills | Status: DC

## 2022-02-27 MED ORDER — CYANOCOBALAMIN 1000 MCG/ML IJ SOLN
1000.0000 ug | Freq: Once | INTRAMUSCULAR | Status: AC
  Administered 2022-02-27: 1000 ug via INTRAMUSCULAR

## 2022-02-27 NOTE — Patient Instructions (Addendum)
Recommend daily vitamin B12 1000 mcg over the counter. I recommend nature made.  ? ?Potassium repeat today.  ?Stop taking hctz daily, as this may be causing the drop in your potassium. ? ?Due to recent changes in healthcare laws, you may see results of your imaging and/or laboratory studies on MyChart before I have had a chance to review them.  I understand that in some cases there may be results that are confusing or concerning to you. Please understand that not all results are received at the same time and often I may need to interpret multiple results in order to provide you with the best plan of care or course of treatment. Therefore, I ask that you please give me 2 business days to thoroughly review all your results before contacting my office for clarification. Should we see a critical lab result, you will be contacted sooner.  ? ?It was a pleasure seeing you today! Please do not hesitate to reach out with any questions and or concerns. ? ?Regards,  ? ?Harlee Eckroth ?FNP-C ? ? ?

## 2022-02-27 NOTE — Progress Notes (Signed)
D/w pt at visits, refer to note from 5/1

## 2022-02-27 NOTE — Progress Notes (Signed)
? ?Established Patient Office Visit ? ?Subjective:  ?Patient ID: Nicole Daniels, female    DOB: 11-30-1950  Age: 71 y.o. MRN: [id number] ? ?CC:  ?Chief Complaint  ?Patient presents with  ? Hypothyroidism  ? ? ?HPI ?Nicole Daniels is here today with concerns.  ? ?Recent hypokalemia.  ?Potassium was 2.8 02/22/22, , went to ER and was replaced and same day with replacement 3.0  ? ?Vitamin b12 def:  ?Lab Results  ?Component Value Date  ? VITAMINB12 188 (L) 02/22/2022  ? ?Urine was positive for protein and small amount of leukocytes,  urine culture did show some strep in the urine.  ? ?Seeing kidney specialist May 9th for evaluation of the 1.6 cm smooth marginated exophytic lesion in midportion of right kidney, which increased in size on most recent CT abd/pelvis. Also possible angiomyolipoma. Higher than usual measurements than for simple cysts per radiologist.  ? ?Past Medical History:  ?Diagnosis Date  ? Abdominal pain, right lower quadrant   ? Acquired cyst of kidney 01/08/2012  ? pt denies history of   ? Acute sinusitis, unspecified   ? Adenomatous colon polyp   ? Allergy   ? Angiomyolipoma   ? kidney  ? Anxiety   ? OCCASIONAL  ? Chest pain   ? Chest pain, unspecified   ? Depressive disorder, not elsewhere classified 01/08/2012  ? not currently   ? Diverticulosis   ? Dizziness and giddiness   ? Dysfunction of eustachian tube   ? Hemorrhoids   ? History of diverticulitis of colon   ? IBS (irritable bowel syndrome)   ? Liver lesion 2011  ? Neoplasm of skin of face   ? malignant  ? OA (osteoarthritis)   ? knee, NECK  ? Obesity, unspecified   ? Ostium secundum type atrial septal defect   ? Other and unspecified hyperlipidemia   ? Other malaise and fatigue   ? Palpitations   ? Shortness of breath   ? Thyroid disease   ? Unspecified essential hypertension   ? pt states she does not have HTN  ? Unspecified sinusitis (chronic)   ? Unspecified vitamin D deficiency   ? ? ?Past Surgical History:  ?Procedure Laterality  Date  ? ABDOMINAL HYSTERECTOMY    ? BREAST REDUCTION SURGERY  1990  ? CERVICAL DISC SURGERY    ? Dr.Nudleman  ? CHOLECYSTECTOMY    ? COLONOSCOPY    ? DILATION AND CURETTAGE OF UTERUS    ? EYE SURGERY Bilateral 07/30/2017  ? cataract sx  ? FLEXIBLE SIGMOIDOSCOPY  01/08/2012  ? Procedure: FLEXIBLE SIGMOIDOSCOPY;  Surgeon: Lafayette Dragon, MD;  Location: [facility];  Service: Endoscopy;  Laterality: N/A;  ? HEMORRHOID SURGERY    ? KNEE ARTHROTOMY    ? pt states no   ? LAMINECTOMY N/A 01/31/2017  ? Procedure: Thoracic Laminectomy and marsupialization of arachnoid cyst;  Surgeon: Jovita Gamma, MD;  Location: [facility];  Service: Neurosurgery;  Laterality: N/A;  ? LEFT OOPHORECTOMY  1976  ? NECK SURGERY N/A 03/2018  ? PARTIAL HYSTERECTOMY    ? right ovary remains  ? REDUCTION MAMMAPLASTY    ? TONSILLECTOMY  1972  ? UPPER GASTROINTESTINAL ENDOSCOPY    ? ? ?Family History  ?Problem Relation Age of Onset  ? Hypertension Mother   ? Diabetes Mother   ? Cirrhosis Father   ? Heart disease Father   ? Hypertension Sister   ? Diabetes Sister   ? Heart disease Brother   ?  x 2  ? Breast cancer Cousin   ? Diabetes Cousin   ? Diabetes Other   ?     aunt  ? Stroke Other   ?     aunt  ? Colon cancer Neg Hx   ? Esophageal cancer Neg Hx   ? Stomach cancer Neg Hx   ? Rectal cancer Neg Hx   ? ? ?Social History  ? ?Socioeconomic History  ? Marital status: Widowed  ?  Spouse name: Not on file  ? Number of children: 2  ? Years of education: Not on file  ? Highest education level: Not on file  ?Occupational History  ? Occupation: retired  ?Tobacco Use  ? Smoking status: Never  ? Smokeless tobacco: Never  ?Vaping Use  ? Vaping Use: Never used  ?Substance and Sexual Activity  ? Alcohol use: No  ? Drug use: No  ? Sexual activity: Yes  ?Other Topics Concern  ? Not on file  ?Social History Narrative  ? Daily caffeine   ? ?Social Determinants of Health  ? ?Financial Resource Strain: Low Risk   ? Difficulty of Paying Living Expenses: Not hard at all   ?Food Insecurity: No Food Insecurity  ? Worried About Charity fundraiser in the Last Year: Never true  ? Ran Out of Food in the Last Year: Never true  ?Transportation Needs: No Transportation Needs  ? Lack of Transportation (Medical): No  ? Lack of Transportation (Non-Medical): No  ?Physical Activity: Sufficiently Active  ? Days of Exercise per Week: 7 days  ? Minutes of Exercise per Session: 30 min  ?Stress: No Stress Concern Present  ? Feeling of Stress : Not at all  ?Social Connections: Moderately Integrated  ? Frequency of Communication with Friends and Family: More than three times a week  ? Frequency of Social Gatherings with Friends and Family: More than three times a week  ? Attends Religious Services: More than 4 times per year  ? Active Member of Clubs or Organizations: Yes  ? Attends Archivist Meetings: More than 4 times per year  ? Marital Status: Widowed  ?Intimate Partner Violence: Not At Risk  ? Fear of Current or Ex-Partner: No  ? Emotionally Abused: No  ? Physically Abused: No  ? Sexually Abused: No  ? ? ?Outpatient Medications Prior to Visit  ?Medication Sig Dispense Refill  ? Cholecalciferol (VITAMIN D-3) 125 MCG (5000 UT) TABS Take by mouth every other day.    ? Cholecalciferol (VITAMIN D3) 2000 units TABS Take 2,000 Units by mouth daily.    ? ezetimibe (ZETIA) 10 MG tablet Take 1 tablet (10 mg total) by mouth daily. 90 tablet 3  ? fluticasone (FLONASE) 50 MCG/ACT nasal spray Place 1 spray into both nostrils as needed for allergies.     ? hydrochlorothiazide (HYDRODIURIL) 25 MG tablet TAKE 1 TABLET BY MOUTH EVERYDAY AT BEDTIME (Patient taking differently: TAKE 1 TABLET BY MOUTH EVERYDAY IN THE AM) 90 tablet 3  ? levothyroxine (SYNTHROID) 50 MCG tablet TAKE 1 TABLET BY MOUTH EVERY DAY 90 tablet 0  ? Lidocaine (HM LIDOCAINE PATCH) 4 % PTCH Use one patch on affected area for 12 hours , then remove for 12 hours as needed 20 patch 0  ? omeprazole (PRILOSEC) 20 MG capsule Take 1 capsule  (20 mg total) by mouth daily. 30 capsule 3  ? pregabalin (LYRICA) 75 MG capsule Take 1 capsule (75 mg total) by mouth 2 (two) times daily. 60 capsule  2  ? ?Facility-Administered Medications Prior to Visit  ?Medication Dose Route Frequency Provider Last Rate Last Admin  ? 0.9 %  sodium chloride infusion  500 mL Intravenous Once Thornton Park, MD      ? ? ?Allergies  ?Allergen Reactions  ? Simvastatin Other (See Comments)  ?  MYALGIAS  ? Statins Other (See Comments)  ?  Has severe leg muscle aches and cramps  ? Bactrim [Sulfamethoxazole-Trimethoprim] Itching  ? Sertraline Anxiety  ? ? ?ROS ?Review of Systems  ?Constitutional:  Negative for chills, fatigue, fever and unexpected weight change.  ?Eyes:  Negative for visual disturbance.  ?Respiratory:  Negative for cough, shortness of breath and wheezing.   ?Cardiovascular:  Negative for chest pain, palpitations and leg swelling.  ?Gastrointestinal:  Negative for abdominal pain.  ?Genitourinary:  Negative for difficulty urinating.  ?Skin:  Negative for rash.  ?Neurological:  Negative for dizziness and headaches.  ? ?  ?Objective:  ?  ?Physical Exam ?Constitutional:   ?   General: She is not in acute distress. ?   Appearance: Normal appearance. She is normal weight. She is not ill-appearing, toxic-appearing or diaphoretic.  ?Cardiovascular:  ?   Rate and Rhythm: Normal rate and regular rhythm.  ?Pulmonary:  ?   Effort: Pulmonary effort is normal.  ?   Breath sounds: Normal breath sounds. No wheezing.  ?Abdominal:  ?   General: Abdomen is flat.  ?   Tenderness: There is no abdominal tenderness.  ?Neurological:  ?   General: No focal deficit present.  ?   Mental Status: She is alert and oriented to person, place, and time.  ?   Motor: No weakness.  ?Psychiatric:     ?   Mood and Affect: Mood normal.     ?   Behavior: Behavior normal.     ?   Thought Content: Thought content normal.     ?   Judgment: Judgment normal.  ? ? ?BP 130/82   Pulse 70   Temp 98.5 ?F (36.9 ?C)    Resp 16   Ht 5' 4.5" (1.638 m)   Wt 181 lb 2 oz (82.2 kg)   SpO2 97%   BMI 30.61 kg/m?  ?Wt Readings from Last 3 Encounters:  ?02/27/22 181 lb 2 oz (82.2 kg)  ?02/22/22 179 lb 0.2 oz (81.2 kg)  ?04/26/

## 2022-02-28 DIAGNOSIS — N3000 Acute cystitis without hematuria: Secondary | ICD-10-CM | POA: Insufficient documentation

## 2022-02-28 DIAGNOSIS — E538 Deficiency of other specified B group vitamins: Secondary | ICD-10-CM | POA: Insufficient documentation

## 2022-02-28 NOTE — Assessment & Plan Note (Signed)
Repeat serum potassium ?Advised pt to d/c HCTZ as may be causing this.  ?Pending results of potassium will decide how to treat. ?F/u two weeks for repeat BMP  ?

## 2022-02-28 NOTE — Assessment & Plan Note (Signed)
Reviewed recent urine culture with pt  ?positive for strep UTI  ?antbx sent to pharmacy for augmentin 875/125 mg as pt tolerates well, pt to take as directed. Encouraged increased water intake throughout the day. Urine culture/reflex pending results. Choosing to treat due to being symptomatic. If no improvement in the next 2 days pt advised to let me know. ? ?

## 2022-02-28 NOTE — Assessment & Plan Note (Signed)
Reviewed recent b12 level, low ?b12 1000 mcg administered in office ?Recommend otc vitamin b12 1000 mcg once daily as well ?Pt tolerated procedure well  ?Verbal consent obtained prior to administration  ? ?

## 2022-03-07 ENCOUNTER — Encounter: Payer: Self-pay | Admitting: Gastroenterology

## 2022-03-07 ENCOUNTER — Ambulatory Visit: Payer: Medicare PPO | Admitting: Gastroenterology

## 2022-03-07 ENCOUNTER — Other Ambulatory Visit: Payer: Self-pay | Admitting: Family

## 2022-03-07 VITALS — BP 147/86 | HR 67 | Temp 98.9°F | Wt 183.3 lb

## 2022-03-07 DIAGNOSIS — B0229 Other postherpetic nervous system involvement: Secondary | ICD-10-CM

## 2022-03-07 DIAGNOSIS — M5481 Occipital neuralgia: Secondary | ICD-10-CM | POA: Insufficient documentation

## 2022-03-07 DIAGNOSIS — E876 Hypokalemia: Secondary | ICD-10-CM

## 2022-03-07 NOTE — Progress Notes (Signed)
?  ?Jonathon Bellows MD, MRCP(U.K) ?[address]  ?[address]  ?[place], [place] [address]  ?Main: [phone]  ?Fax: [phone] ? ? ?Gastroenterology Consultation ? ?Referring Provider:     Eugenia Pancoast, FNP ?Primary Care Physician:  Eugenia Pancoast, FNP ?Primary Gastroenterologist:  Dr. Jonathon Bellows  ?Reason for Consultation:   Right upper quadrant pain ?      ? HPI:   ?Nicole Daniels is a 71 y.o. y/o female referred for consultation & management  by Eugenia Pancoast, [place].   ? ?She has been referred for right upper quadrant pain ? ?She is to be seen by Dr. Laurier Nancy previously. ? ?06/16/2021: Colonoscopy 6 sessile polyps subcentimeter diverticulosis of colon noted ?12/20/2021: CT abdomen pelvis with out contrast showed diverticulosis of the colon without evidence of focal diverticulitis ?02/22/2022 CMP normal except a low potassium, hemoglobin 14.6 g.  H. pylori breath test negative.  B12 levels low 188. ?Recently treated for an acute cystitis without hematuria.  Commenced on B12 supplementation. ? ?She says that she was referred to see me for right upper quadrant pain.  She had an episode of herpes affecting the right upper part of her abdomen about 3 months back and subsequently developed severe burning sensation in the same area which is gradually been getting better to the point that it is very minimal at this point of time.  No other aggravating or relieving factors.  No change in bowel habits. ?Past Medical History:  ?Diagnosis Date  ? Abdominal pain, right lower quadrant   ? Acquired cyst of kidney 01/08/2012  ? pt denies history of   ? Acute sinusitis, unspecified   ? Adenomatous colon polyp   ? Allergy   ? Angiomyolipoma   ? kidney  ? Anxiety   ? OCCASIONAL  ? Chest pain   ? Chest pain, unspecified   ? Depressive disorder, not elsewhere classified 01/08/2012  ? not currently   ? Diverticulosis   ? Dizziness and giddiness   ? Dysfunction of eustachian tube   ? Hemorrhoids   ? History of diverticulitis  of colon   ? IBS (irritable bowel syndrome)   ? Liver lesion 2011  ? Neoplasm of skin of face   ? malignant  ? OA (osteoarthritis)   ? knee, NECK  ? Obesity, unspecified   ? Ostium secundum type atrial septal defect   ? Other and unspecified hyperlipidemia   ? Other malaise and fatigue   ? Palpitations   ? Shortness of breath   ? Thyroid disease   ? Unspecified essential hypertension   ? pt states she does not have HTN  ? Unspecified sinusitis (chronic)   ? Unspecified vitamin D deficiency   ? ? ?Past Surgical History:  ?Procedure Laterality Date  ? ABDOMINAL HYSTERECTOMY    ? BREAST REDUCTION SURGERY  1990  ? CERVICAL DISC SURGERY    ? Dr.Nudleman  ? CHOLECYSTECTOMY    ? COLONOSCOPY    ? DILATION AND CURETTAGE OF UTERUS    ? EYE SURGERY Bilateral 07/30/2017  ? cataract sx  ? FLEXIBLE SIGMOIDOSCOPY  01/08/2012  ? Procedure: FLEXIBLE SIGMOIDOSCOPY;  Surgeon: Lafayette Dragon, MD;  Location: [facility];  Service: Endoscopy;  Laterality: N/A;  ? HEMORRHOID SURGERY    ? KNEE ARTHROTOMY    ? pt states no   ? LAMINECTOMY N/A 01/31/2017  ? Procedure: Thoracic Laminectomy and marsupialization of arachnoid cyst;  Surgeon: Jovita Gamma, MD;  Location: [facility];  Service: Neurosurgery;  Laterality: N/A;  ? LEFT OOPHORECTOMY  1976  ? NECK SURGERY N/A 03/2018  ? PARTIAL HYSTERECTOMY    ? right ovary remains  ? REDUCTION MAMMAPLASTY    ? TONSILLECTOMY  1972  ? UPPER GASTROINTESTINAL ENDOSCOPY    ? ? ?Prior to Admission medications   ?Medication Sig Start Date End Date Taking? Authorizing Provider  ?amoxicillin-clavulanate (AUGMENTIN) 875-125 MG tablet Take 1 tablet by mouth 2 (two) times daily. 02/27/22   Eugenia Pancoast, FNP  ?Cholecalciferol (VITAMIN D-3) 125 MCG (5000 UT) TABS Take by mouth every other day.    [provider]  ?Cholecalciferol (VITAMIN D3) 2000 units TABS Take 2,000 Units by mouth daily.    [provider]  ?ezetimibe (ZETIA) 10 MG tablet Take 1 tablet (10 mg total) by mouth daily. 06/01/21    Cirigliano, Garvin Fila, DO  ?fluticasone (FLONASE) 50 MCG/ACT nasal spray Place 1 spray into both nostrils as needed for allergies.     [provider]  ?hydrochlorothiazide (HYDRODIURIL) 25 MG tablet TAKE 1 TABLET BY MOUTH EVERYDAY AT BEDTIME ?Patient taking differently: TAKE 1 TABLET BY MOUTH EVERYDAY IN THE AM 04/07/21   Ronnald Nian, DO  ?levothyroxine (SYNTHROID) 50 MCG tablet TAKE 1 TABLET BY MOUTH EVERY DAY 01/25/22 04/25/22  Eugenia Pancoast, FNP  ?Lidocaine (HM LIDOCAINE PATCH) 4 % PTCH Use one patch on affected area for 12 hours , then remove for 12 hours as needed 01/13/22   Tower, Wynelle Fanny, MD  ?omeprazole (PRILOSEC) 20 MG capsule Take 1 capsule (20 mg total) by mouth daily. 02/22/22   Eugenia Pancoast, FNP  ?potassium chloride (KLOR-CON M) 10 MEQ tablet Take one po bid for one week, then decrease to one tablet once daily week two. Then d/c 02/27/22   Eugenia Pancoast, FNP  ?pregabalin (LYRICA) 75 MG capsule Take 1 capsule (75 mg total) by mouth 2 (two) times daily. 02/22/22 05/23/22  Eugenia Pancoast, FNP  ? ? ?Family History  ?Problem Relation Age of Onset  ? Hypertension Mother   ? Diabetes Mother   ? Cirrhosis Father   ? Heart disease Father   ? Hypertension Sister   ? Diabetes Sister   ? Heart disease Brother   ?     x 2  ? Breast cancer Cousin   ? Diabetes Cousin   ? Diabetes Other   ?     aunt  ? Stroke Other   ?     aunt  ? Colon cancer Neg Hx   ? Esophageal cancer Neg Hx   ? Stomach cancer Neg Hx   ? Rectal cancer Neg Hx   ?  ? ?Social History  ? ?Tobacco Use  ? Smoking status: Never  ? Smokeless tobacco: Never  ?Vaping Use  ? Vaping Use: Never used  ?Substance Use Topics  ? Alcohol use: No  ? Drug use: No  ? ? ?Allergies as of 03/07/2022 - Review Complete 03/07/2022  ?Allergen Reaction Noted  ? Simvastatin Other (See Comments) 01/27/2011  ? Statins Other (See Comments) 05/14/2017  ? Bactrim [sulfamethoxazole-trimethoprim] Itching 11/12/2013  ? Sertraline Anxiety 04/10/2019  ? ? ?Review of Systems:     ?All systems reviewed and negative except where noted in HPI. ? ? Physical Exam:  ?BP (!) 147/86   Pulse 67   Temp 98.9 ?F (37.2 ?C) (Oral)   Wt 183 lb 4.8 oz (83.1 kg)   BMI 30.98 kg/m?  ?No LMP recorded. Patient has had a hysterectomy. ?Psych:  Alert and cooperative.  Normal mood and affect. ?General:   Alert,  Well-developed, well-nourished, pleasant and cooperative in NAD ?Head:  Normocephalic and atraumatic. ?Eyes:  Sclera clear, no icterus.   Conjunctiva pink. ?Ears:  Normal auditory acuity. ?Abdomen:  Normal bowel sounds.  No bruits.  Soft, non-tender and non-distended without masses, hepatosplenomegaly or hernias noted.  No guarding or rebound tenderness.    ?Neurologic:  Alert and oriented x3;  grossly normal neurologically. ?Psych:  Alert and cooperative. Normal mood and affect. ? ?Imaging Studies: ?No results found. ? ?Assessment and Plan:  ? ?ATLEIGH GRUEN is a 71 y.o. y/o female has been referred for right upper quadrant pain.  Very likely postherpetic neuralgia in the same area that she had the shingles which does not require any further evaluation. ? ?Follow up in as needed ? ?Dr Jonathon Bellows MD,MRCP(U.K) ? ?

## 2022-03-13 ENCOUNTER — Ambulatory Visit: Payer: Medicare PPO | Admitting: Family

## 2022-03-13 ENCOUNTER — Encounter: Payer: Self-pay | Admitting: Family

## 2022-03-13 VITALS — BP 132/74 | HR 68 | Temp 98.4°F | Resp 16 | Ht 64.5 in | Wt 181.5 lb

## 2022-03-13 DIAGNOSIS — D1771 Benign lipomatous neoplasm of kidney: Secondary | ICD-10-CM

## 2022-03-13 DIAGNOSIS — R109 Unspecified abdominal pain: Secondary | ICD-10-CM

## 2022-03-13 DIAGNOSIS — N289 Disorder of kidney and ureter, unspecified: Secondary | ICD-10-CM

## 2022-03-13 DIAGNOSIS — E538 Deficiency of other specified B group vitamins: Secondary | ICD-10-CM | POA: Diagnosis not present

## 2022-03-13 DIAGNOSIS — I10 Essential (primary) hypertension: Secondary | ICD-10-CM

## 2022-03-13 DIAGNOSIS — E876 Hypokalemia: Secondary | ICD-10-CM

## 2022-03-13 NOTE — Progress Notes (Signed)
? ?Established Patient Office Visit ? ?Subjective:  ?Patient ID: Nicole Daniels, female    DOB: October 10, 1951  Age: 71 y.o. MRN: [id number] ? ?CC:  ?Chief Complaint  ?Patient presents with  ? hypokalemia  ?  Follow   ? ? ?HPI ?Nicole Daniels is here today for follow up.  ?Pt is without acute concerns. ? ?Hypokalemia: starting to feel slightly better, still not a whole lot of energy but better than prior.  ? ?Had appt with GI on May 9th. Referred to GI for RUQ pain, however suggested due to PHN.  ? ?Did not yet set up appt with urology, was in [place] location which pt states is too far away for her to drive.  ? ?Hypokalemia: pt d/c HCTZ 5/9, two weeks ago for decreasing potassium. She has been taking 10 meq kcl twice daily x one week and once daily for the last one week, with today being last dose. We will repeat today.  ? ?PHN: never started lyrica because low potassium and since then she has had improvement in her pain. For now she is going to stay off of it. Advil prn.  ? ?UTI just finished up antbx yesterday. Flank pain has improved slightly since taking this.  ? ?Past Medical History:  ?Diagnosis Date  ? Abdominal pain, right lower quadrant   ? Acquired cyst of kidney 01/08/2012  ? pt denies history of   ? Acute sinusitis, unspecified   ? Adenomatous colon polyp   ? Allergy   ? Angiomyolipoma   ? kidney  ? Anxiety   ? OCCASIONAL  ? Chest pain   ? Chest pain, unspecified   ? Depressive disorder, not elsewhere classified 01/08/2012  ? not currently   ? Diverticulosis   ? Dizziness and giddiness   ? Dysfunction of eustachian tube   ? Hemorrhoids   ? History of diverticulitis of colon   ? IBS (irritable bowel syndrome)   ? Liver lesion 2011  ? Neoplasm of skin of face   ? malignant  ? OA (osteoarthritis)   ? knee, NECK  ? Obesity, unspecified   ? Ostium secundum type atrial septal defect   ? Other and unspecified hyperlipidemia   ? Other malaise and fatigue   ? Palpitations   ? Shortness of breath   ? Thyroid  disease   ? Unspecified essential hypertension   ? pt states she does not have HTN  ? Unspecified sinusitis (chronic)   ? Unspecified vitamin D deficiency   ? ? ?Past Surgical History:  ?Procedure Laterality Date  ? ABDOMINAL HYSTERECTOMY    ? BREAST REDUCTION SURGERY  1990  ? CERVICAL DISC SURGERY    ? Dr.Nudleman  ? CHOLECYSTECTOMY    ? COLONOSCOPY    ? DILATION AND CURETTAGE OF UTERUS    ? EYE SURGERY Bilateral 07/30/2017  ? cataract sx  ? FLEXIBLE SIGMOIDOSCOPY  01/08/2012  ? Procedure: FLEXIBLE SIGMOIDOSCOPY;  Surgeon: Lafayette Dragon, MD;  Location: [facility];  Service: Endoscopy;  Laterality: N/A;  ? HEMORRHOID SURGERY    ? KNEE ARTHROTOMY    ? pt states no   ? LAMINECTOMY N/A 01/31/2017  ? Procedure: Thoracic Laminectomy and marsupialization of arachnoid cyst;  Surgeon: Jovita Gamma, MD;  Location: [facility];  Service: Neurosurgery;  Laterality: N/A;  ? LEFT OOPHORECTOMY  1976  ? NECK SURGERY N/A 03/2018  ? PARTIAL HYSTERECTOMY    ? right ovary remains  ? REDUCTION MAMMAPLASTY    ? TONSILLECTOMY  1972  ? UPPER GASTROINTESTINAL ENDOSCOPY    ? ? ?Family History  ?Problem Relation Age of Onset  ? Hypertension Mother   ? Diabetes Mother   ? Cirrhosis Father   ? Heart disease Father   ? Hypertension Sister   ? Diabetes Sister   ? Heart disease Brother   ?     x 2  ? Breast cancer Cousin   ? Diabetes Cousin   ? Diabetes Other   ?     aunt  ? Stroke Other   ?     aunt  ? Colon cancer Neg Hx   ? Esophageal cancer Neg Hx   ? Stomach cancer Neg Hx   ? Rectal cancer Neg Hx   ? ? ?Social History  ? ?Socioeconomic History  ? Marital status: Widowed  ?  Spouse name: Not on file  ? Number of children: 2  ? Years of education: Not on file  ? Highest education level: Not on file  ?Occupational History  ? Occupation: retired  ?Tobacco Use  ? Smoking status: Never  ? Smokeless tobacco: Never  ?Vaping Use  ? Vaping Use: Never used  ?Substance and Sexual Activity  ? Alcohol use: No  ? Drug use: No  ? Sexual activity: Yes  ?Other  Topics Concern  ? Not on file  ?Social History Narrative  ? Daily caffeine   ? ?Social Determinants of Health  ? ?Financial Resource Strain: Not on file  ?Food Insecurity: Not on file  ?Transportation Needs: Not on file  ?Physical Activity: Not on file  ?Stress: Not on file  ?Social Connections: Not on file  ?Intimate Partner Violence: Not on file  ? ? ?Outpatient Medications Prior to Visit  ?Medication Sig Dispense Refill  ? Cholecalciferol (VITAMIN D-3) 125 MCG (5000 UT) TABS Take by mouth every other day.    ? Cholecalciferol (VITAMIN D3) 2000 units TABS Take 2,000 Units by mouth daily.    ? ezetimibe (ZETIA) 10 MG tablet Take 1 tablet (10 mg total) by mouth daily. 90 tablet 3  ? fluticasone (FLONASE) 50 MCG/ACT nasal spray Place 1 spray into both nostrils as needed for allergies.     ? levothyroxine (SYNTHROID) 50 MCG tablet TAKE 1 TABLET BY MOUTH EVERY DAY 90 tablet 0  ? omeprazole (PRILOSEC) 20 MG capsule Take 1 capsule (20 mg total) by mouth daily. 30 capsule 3  ? potassium chloride (KLOR-CON M) 10 MEQ tablet Take one po bid for one week, then decrease to one tablet once daily week two. Then d/c 21 tablet 0  ? amoxicillin-clavulanate (AUGMENTIN) 875-125 MG tablet Take 1 tablet by mouth 2 (two) times daily. (Patient not taking: Reported on 03/13/2022) 20 tablet 0  ? hydrochlorothiazide (HYDRODIURIL) 25 MG tablet TAKE 1 TABLET BY MOUTH EVERYDAY AT BEDTIME (Patient not taking: Reported on 03/13/2022) 90 tablet 3  ? Lidocaine (HM LIDOCAINE PATCH) 4 % PTCH Use one patch on affected area for 12 hours , then remove for 12 hours as needed (Patient not taking: Reported on 03/13/2022) 20 patch 0  ? pregabalin (LYRICA) 75 MG capsule Take 1 capsule (75 mg total) by mouth 2 (two) times daily. (Patient not taking: Reported on 03/13/2022) 60 capsule 2  ? ?Facility-Administered Medications Prior to Visit  ?Medication Dose Route Frequency Provider Last Rate Last Admin  ? 0.9 %  sodium chloride infusion  500 mL Intravenous Once  Thornton Park, MD      ? ? ?Allergies  ?Allergen Reactions  ?  Simvastatin Other (See Comments)  ?  MYALGIAS  ? Statins Other (See Comments)  ?  Has severe leg muscle aches and cramps  ? Bactrim [Sulfamethoxazole-Trimethoprim] Itching  ? Sertraline Anxiety  ? ? ?ROS ?Review of Systems  ?Constitutional:  Negative for chills and fatigue.  ?Respiratory:  Negative for cough and shortness of breath.   ?Cardiovascular:  Negative for chest pain and leg swelling.  ?Gastrointestinal:  Negative for abdominal pain, diarrhea and nausea.  ?Genitourinary:  Negative for difficulty urinating, dysuria, flank pain (improved), menstrual problem and vaginal bleeding.  ?Musculoskeletal:  Negative for back pain (resolved back pain).  ?Neurological:  Negative for dizziness and headaches.  ?Psychiatric/Behavioral:  Negative for agitation and sleep disturbance.   ?All other systems reviewed and are negative. ? ? ? ?  ?Objective:  ?  ?Physical Exam ?Constitutional:   ?   General: She is not in acute distress. ?   Appearance: Normal appearance. She is obese. She is not ill-appearing, toxic-appearing or diaphoretic.  ?Cardiovascular:  ?   Rate and Rhythm: Normal rate and regular rhythm.  ?Pulmonary:  ?   Effort: Pulmonary effort is normal.  ?Abdominal:  ?   Tenderness: There is no right CVA tenderness or left CVA tenderness.  ?Neurological:  ?   General: No focal deficit present.  ?   Mental Status: She is alert and oriented to person, place, and time. Mental status is at baseline.  ?Psychiatric:     ?   Mood and Affect: Mood normal.     ?   Behavior: Behavior normal.     ?   Thought Content: Thought content normal.     ?   Judgment: Judgment normal.  ? ? ? ?BP 132/74   Pulse 68   Temp 98.4 ?F (36.9 ?C)   Resp 16   Ht 5' 4.5" (1.638 m)   Wt 181 lb 8 oz (82.3 kg)   SpO2 97%   BMI 30.67 kg/m?  ?Wt Readings from Last 3 Encounters:  ?03/13/22 181 lb 8 oz (82.3 kg)  ?03/07/22 183 lb 4.8 oz (83.1 kg)  ?02/27/22 181 lb 2 oz (82.2 kg)   ? ? ? ?Health Maintenance Due  ?Topic Date Due  ? Zoster Vaccines- Shingrix (1 of 2) Never done  ? Pneumonia Vaccine 35+ Years old (1 - PCV) Never done  ? TETANUS/TDAP  08/25/2018  ? COVID-19 Vaccine (3 - [employer]

## 2022-03-13 NOTE — Assessment & Plan Note (Signed)
New ref placed as old referral closed out by urology as pt had active shingles at time.  ?Pt to call and schedule. ?

## 2022-03-13 NOTE — Patient Instructions (Addendum)
A referral was placed today for urology in [place], as [place] is too far for you.  ?[facility] ?Address: [address], [place] [address] ?Phone: [phone] ?Give the office a call to schedule.  ? ?Start over the counter b12 1000 mcg once daily as well.  ? ?Due to recent changes in healthcare laws, you may see results of your imaging and/or laboratory studies on MyChart before I have had a chance to review them.  I understand that in some cases there may be results that are confusing or concerning to you. Please understand that not all results are received at the same time and often I may need to interpret multiple results in order to provide you with the best plan of care or course of treatment. Therefore, I ask that you please give me 2 business days to thoroughly review all your results before contacting my office for clarification. Should we see a critical lab result, you will be contacted sooner.  ? ?It was a pleasure seeing you today! Please do not hesitate to reach out with any questions and or concerns. ? ?Regards,  ? ?Jayliana Valencia ?FNP-C ? ? ? ?

## 2022-03-13 NOTE — Assessment & Plan Note (Signed)
continue with b12 injections, otc to start , 1000 mcg b12 once daily. ?

## 2022-03-13 NOTE — Assessment & Plan Note (Signed)
D/c hctz last visit.  ?Continue to stay off ?Ordering serum potassium today pending results ?

## 2022-03-13 NOTE — Assessment & Plan Note (Signed)
Continue with d/c of hctz as thought to be decreasing potassium ?Will see repeat K today to see if improvement. ?bp stable. ?

## 2022-03-14 ENCOUNTER — Ambulatory Visit: Payer: Medicare PPO

## 2022-03-14 LAB — POTASSIUM: Potassium: 4.2 mEq/L (ref 3.5–5.1)

## 2022-03-15 ENCOUNTER — Encounter: Payer: Self-pay | Admitting: Urology

## 2022-03-15 ENCOUNTER — Ambulatory Visit: Payer: Medicare PPO | Admitting: Urology

## 2022-03-15 VITALS — BP 148/77 | HR 80 | Ht 64.5 in | Wt 181.0 lb

## 2022-03-15 DIAGNOSIS — N289 Disorder of kidney and ureter, unspecified: Secondary | ICD-10-CM

## 2022-03-15 DIAGNOSIS — R3129 Other microscopic hematuria: Secondary | ICD-10-CM | POA: Diagnosis not present

## 2022-03-15 DIAGNOSIS — N2889 Other specified disorders of kidney and ureter: Secondary | ICD-10-CM | POA: Diagnosis not present

## 2022-03-15 LAB — URINALYSIS, COMPLETE
Bilirubin, UA: NEGATIVE
Glucose, UA: NEGATIVE
Ketones, UA: NEGATIVE
Nitrite, UA: NEGATIVE
Protein,UA: NEGATIVE
Specific Gravity, UA: 1.025 (ref 1.005–1.030)
Urobilinogen, Ur: 0.2 mg/dL (ref 0.2–1.0)
pH, UA: 6 (ref 5.0–7.5)

## 2022-03-15 LAB — MICROSCOPIC EXAMINATION: Bacteria, UA: NONE SEEN

## 2022-03-15 NOTE — Progress Notes (Signed)
? ?03/15/2022 ?9:17 AM  ? ?Nicole Daniels ?27-Feb-1951 ?[id number] ? ?Referring provider:  ?Eugenia Pancoast, FNP ?[address] ?[address] ?[place],  [place] [address] ?Chief Complaint  ?Patient presents with  ? Renal Mass  ? ? ?HPI: ?Nicole Daniels is a 71 y.o.female for further evaluation of left kidney nodule and bilateral kidney lesions.  ? ?She was seen by her PCP, Eugenia Pancoast, FNP, on 12/20/2021. She was noted to have right lower back pain with tenderness that hurt with movement. It radiated to under her right breast and was constant. Acute right flank pain was also noted. Urine culture had no growth.  ? ?She underwent a CT abdomen an pelvis to further evaluate her flank pain. It visualized  1.6 cm smooth marginated exophytic lesion in the midportion of right kidney which has increased in size. There is 6 mm fatty lesion in the upper pole of right kidney. There is 11 mm exophytic smooth marginated lesion in the posterior upper pole of left kidney which has decreased in size. There is questionable 3 mm high density nodule in the lateral margin of midportion of left kidney. She was referred to urology. ? ?Notably, she has had previous CT scans dating back to 2011 which time the right interpolar kidney had a 7 mm lesion which was nonenhancing.  There is also a nonenhancing lesion measuring 2.3 cm on the left.  She also had a 5 mm right upper pole AML. ? ?She has a UTI on 02/22/2022. Culture grew Streptococcus agalactiae. She was treated with Augmentin.  She reports that she is completely asymptomatic and did not know that she had an infection.  As far she is aware, she does not have issues with recurrent urinary tract infections. ? ?No gross hematuria or dysuria.  Never smoker.  No chemical exposures. ? ?PMH: ?Past Medical History:  ?Diagnosis Date  ? Abdominal pain, right lower quadrant   ? Acquired cyst of kidney 01/08/2012  ? pt denies history of   ? Acute sinusitis, unspecified   ? Adenomatous colon polyp   ?  Allergy   ? Angiomyolipoma   ? kidney  ? Anxiety   ? OCCASIONAL  ? Chest pain   ? Chest pain, unspecified   ? Depressive disorder, not elsewhere classified 01/08/2012  ? not currently   ? Diverticulosis   ? Dizziness and giddiness   ? Dysfunction of eustachian tube   ? Hemorrhoids   ? History of diverticulitis of colon   ? IBS (irritable bowel syndrome)   ? Liver lesion 2011  ? Neoplasm of skin of face   ? malignant  ? OA (osteoarthritis)   ? knee, NECK  ? Obesity, unspecified   ? Ostium secundum type atrial septal defect   ? Other and unspecified hyperlipidemia   ? Other malaise and fatigue   ? Palpitations   ? Shortness of breath   ? Thyroid disease   ? Unspecified essential hypertension   ? pt states she does not have HTN  ? Unspecified sinusitis (chronic)   ? Unspecified vitamin D deficiency   ? ? ?Surgical History: ?Past Surgical History:  ?Procedure Laterality Date  ? ABDOMINAL HYSTERECTOMY    ? BREAST REDUCTION SURGERY  1990  ? CERVICAL DISC SURGERY    ? Dr.Nudleman  ? CHOLECYSTECTOMY    ? COLONOSCOPY    ? DILATION AND CURETTAGE OF UTERUS    ? EYE SURGERY Bilateral 07/30/2017  ? cataract sx  ? FLEXIBLE SIGMOIDOSCOPY  01/08/2012  ?  Procedure: FLEXIBLE SIGMOIDOSCOPY;  Surgeon: Lafayette Dragon, MD;  Location: [facility];  Service: Endoscopy;  Laterality: N/A;  ? HEMORRHOID SURGERY    ? KNEE ARTHROTOMY    ? pt states no   ? LAMINECTOMY N/A 01/31/2017  ? Procedure: Thoracic Laminectomy and marsupialization of arachnoid cyst;  Surgeon: Jovita Gamma, MD;  Location: [facility];  Service: Neurosurgery;  Laterality: N/A;  ? LEFT OOPHORECTOMY  1976  ? NECK SURGERY N/A 03/2018  ? PARTIAL HYSTERECTOMY    ? right ovary remains  ? REDUCTION MAMMAPLASTY    ? TONSILLECTOMY  1972  ? UPPER GASTROINTESTINAL ENDOSCOPY    ? ? ?Home Medications:  ?Allergies as of 03/15/2022   ? ?   Reactions  ? Simvastatin Other (See Comments)  ? MYALGIAS  ? Statins Other (See Comments)  ? Has severe leg muscle aches and cramps  ? Bactrim  [sulfamethoxazole-trimethoprim] Itching  ? Sertraline Anxiety  ? ?  ? ?  ?Medication List  ?  ? ?  ? Accurate as of Mar 15, 2022  9:17 AM. If you have any questions, ask your nurse or doctor.  ?  ?  ? ?  ? ?STOP taking these medications   ? ?potassium chloride 10 MEQ tablet ?Commonly known as: KLOR-CON M ?Stopped by: Hollice Espy, MD ?  ? ?  ? ?TAKE these medications   ? ?ezetimibe 10 MG tablet ?Commonly known as: Zetia ?Take 1 tablet (10 mg total) by mouth daily. ?  ?fluticasone 50 MCG/ACT nasal spray ?Commonly known as: FLONASE ?Place 1 spray into both nostrils as needed for allergies. ?  ?levothyroxine 50 MCG tablet ?Commonly known as: SYNTHROID ?TAKE 1 TABLET BY MOUTH EVERY DAY ?  ?omeprazole 20 MG capsule ?Commonly known as: PRILOSEC ?Take 1 capsule (20 mg total) by mouth daily. ?  ?Vitamin D3 50 MCG (2000 UT) Tabs ?Take 2,000 Units by mouth daily. ?  ?Vitamin D-3 125 MCG (5000 UT) Tabs ?Take by mouth every other day. ?  ? ?  ? ? ?Allergies:  ?Allergies  ?Allergen Reactions  ? Simvastatin Other (See Comments)  ?  MYALGIAS  ? Statins Other (See Comments)  ?  Has severe leg muscle aches and cramps  ? Bactrim [Sulfamethoxazole-Trimethoprim] Itching  ? Sertraline Anxiety  ? ? ?Family History: ?Family History  ?Problem Relation Age of Onset  ? Hypertension Mother   ? Diabetes Mother   ? Cirrhosis Father   ? Heart disease Father   ? Hypertension Sister   ? Diabetes Sister   ? Heart disease Brother   ?     x 2  ? Breast cancer Cousin   ? Diabetes Cousin   ? Diabetes Other   ?     aunt  ? Stroke Other   ?     aunt  ? Colon cancer Neg Hx   ? Esophageal cancer Neg Hx   ? Stomach cancer Neg Hx   ? Rectal cancer Neg Hx   ? ? ?Social History:  reports that she has never smoked. She has never used smokeless tobacco. She reports that she does not drink alcohol and does not use drugs. ? ? ?Physical Exam: ?BP (!) 148/77   Pulse 80   Ht 5' 4.5" (1.638 m)   Wt 181 lb (82.1 kg)   BMI 30.59 kg/m?   ?Constitutional:  Alert and  oriented, No acute distress. ?HEENT: [place] AT, moist mucus membranes.  Trachea midline, no masses. ?Cardiovascular: No clubbing, cyanosis, or edema. ?Respiratory:  Normal respiratory effort, no increased work of breathing. ?Skin: No rashes, bruises or suspicious lesions. ?Neurologic: Grossly intact, no focal deficits, moving all 4 extremities. ?Psychiatric: Normal mood and affect. ? ?Laboratory Data: ? ?Lab Results  ?Component Value Date  ? CREATININE 0.79 02/22/2022  ? ?Lab Results  ?Component Value Date  ? HGBA1C 5.7 03/03/2020  ? ? ?Urinalysis ?3- 10 RBCs but otherwise unremarkable per higher prior field  ? ?Pertinent Imaging: ?CLINICAL DATA:  Right flank pain ?  ?EXAM: ?CT ABDOMEN AND PELVIS WITHOUT CONTRAST ?  ?TECHNIQUE: ?Multidetector CT imaging of the abdomen and pelvis was performed ?following the standard protocol without IV contrast. ?  ?RADIATION DOSE REDUCTION: This exam was performed according to the ?departmental dose-optimization program which includes automated ?exposure control, adjustment of the mA and/or kV according to ?patient size and/or use of iterative reconstruction technique. ?  ?COMPARISON:  12/24/2009 ?  ?FINDINGS: ?Lower chest: Unremarkable. ?  ?Hepatobiliary: Surgical clips are seen in gallbladder fossa. There ?is no dilation of bile ducts. ?  ?Pancreas: No focal abnormality is seen. ?  ?Spleen: Unremarkable. ?  ?Adrenals/Urinary Tract: Adrenals are unremarkable. There is no ?hydronephrosis. There are no renal or ureteral stones. Urinary ?bladder is not distended. There is 1.6 cm smooth marginated ?exophytic lesion in the midportion of right kidney which has ?increased in size. There is 6 mm fatty lesion in the upper pole of ?right kidney. There is 11 mm exophytic smooth marginated lesion in ?the posterior upper pole of left kidney which has decreased in size. ?In the image 29 of series 2, there is questionable 3 mm high density ?nodule in the lateral margin of midportion of left kidney. ?   ?Stomach/Bowel: Stomach is not distended. Small bowel loops are not ?dilated. Appendix is not seen. There is no pericecal inflammation. ?Multiple diverticula are seen in colon without signs of focal ?diverticulit

## 2022-03-23 ENCOUNTER — Ambulatory Visit: Payer: Medicare PPO | Admitting: Urology

## 2022-03-23 VITALS — BP 171/77 | HR 70 | Ht 64.5 in | Wt 181.0 lb

## 2022-03-23 DIAGNOSIS — R3129 Other microscopic hematuria: Secondary | ICD-10-CM | POA: Diagnosis not present

## 2022-03-23 NOTE — Progress Notes (Signed)
   03/23/2022  CC:  Chief Complaint  Patient presents with   Cysto     HPI: Nicole Daniels is a 71 y.o.female with a personal history of left kidney nodule and microscopic hematuria who presents today for a diagnostic cystoscopy.   She underwent a CT abdomen an pelvis to further evaluate her flank pain. It visualized  1.6 cm smooth marginated exophytic lesion in the midportion of right kidney which has increased in size. There is 6 mm fatty lesion in the upper pole of right kidney. There is 11 mm exophytic smooth marginated lesion in the posterior upper pole of left kidney which has decreased in size. There is questionable 3 mm high density nodule in the lateral margin of midportion of left kidney. She was referred to urology.   Notably, she has had previous CT scans dating back to 2011 which time the right interpolar kidney had a 7 mm lesion which was nonenhancing.  There is also a nonenhancing lesion measuring 2.3 cm on the left.  She also had a 5 mm right upper pole AML.    Vitals:   03/23/22 0831  BP: (!) 171/77  Pulse: 70   NED. A&Ox3.   No respiratory distress   Abd soft, NT, ND Normal external genitalia with patent urethral meatus  Cystoscopy Procedure Note  Patient identification was confirmed, informed consent was obtained, and patient was prepped using Betadine solution.  Lidocaine jelly was administered per urethral meatus.    Procedure: - Flexible cystoscope introduced, without any difficulty.   - Thorough search of the bladder revealed:    normal urethral meatus with some slight bleeding with manipulation of the distal urethra    normal urothelium    no stones    no ulcers     no tumors    no urethral polyps    no trabeculation  - Ureteral orifices were normal in position and appearance.  Post-Procedure: - Patient tolerated the procedure well  Assessment/ Plan:  Microscopic hematuria  -Mild irritation of the distal urethra with manipulation of the  scope, may be source of bleeding -She is asymptomatic from this, as such, would not recommend any intervention but if she develops recurrent UTIs or dysuria, may benefit from some topical estrogen cream - Recommend that is she continues to have microscopic blood or experiences gross hematuria that she should follow-up for further evaluation    Conley Rolls as a scribe for Hollice Espy, MD.,have documented all relevant documentation on the behalf of Hollice Espy, MD,as directed by  Hollice Espy, MD while in the presence of Hollice Espy, MD.  I have reviewed the above documentation for accuracy and completeness, and I agree with the above.   Hollice Espy, MD

## 2022-04-27 ENCOUNTER — Other Ambulatory Visit: Payer: Self-pay | Admitting: Family

## 2022-04-27 DIAGNOSIS — E039 Hypothyroidism, unspecified: Secondary | ICD-10-CM

## 2022-05-19 DIAGNOSIS — H16141 Punctate keratitis, right eye: Secondary | ICD-10-CM | POA: Diagnosis not present

## 2022-05-19 DIAGNOSIS — H1031 Unspecified acute conjunctivitis, right eye: Secondary | ICD-10-CM | POA: Diagnosis not present

## 2022-05-26 DIAGNOSIS — H16141 Punctate keratitis, right eye: Secondary | ICD-10-CM | POA: Diagnosis not present

## 2022-05-26 DIAGNOSIS — H1031 Unspecified acute conjunctivitis, right eye: Secondary | ICD-10-CM | POA: Diagnosis not present

## 2022-05-29 ENCOUNTER — Encounter: Payer: Self-pay | Admitting: Family

## 2022-05-29 ENCOUNTER — Telehealth: Payer: Self-pay

## 2022-05-29 ENCOUNTER — Other Ambulatory Visit: Payer: Self-pay | Admitting: Family

## 2022-05-29 ENCOUNTER — Ambulatory Visit (INDEPENDENT_AMBULATORY_CARE_PROVIDER_SITE_OTHER): Payer: Medicare PPO | Admitting: Family

## 2022-05-29 VITALS — BP 138/72 | HR 71 | Temp 98.7°F | Resp 16 | Ht 64.5 in | Wt 181.0 lb

## 2022-05-29 DIAGNOSIS — M5412 Radiculopathy, cervical region: Secondary | ICD-10-CM

## 2022-05-29 DIAGNOSIS — E782 Mixed hyperlipidemia: Secondary | ICD-10-CM

## 2022-05-29 DIAGNOSIS — Z0000 Encounter for general adult medical examination without abnormal findings: Secondary | ICD-10-CM | POA: Insufficient documentation

## 2022-05-29 DIAGNOSIS — N952 Postmenopausal atrophic vaginitis: Secondary | ICD-10-CM

## 2022-05-29 DIAGNOSIS — T466X5A Adverse effect of antihyperlipidemic and antiarteriosclerotic drugs, initial encounter: Secondary | ICD-10-CM

## 2022-05-29 DIAGNOSIS — Z23 Encounter for immunization: Secondary | ICD-10-CM | POA: Diagnosis not present

## 2022-05-29 DIAGNOSIS — E876 Hypokalemia: Secondary | ICD-10-CM

## 2022-05-29 DIAGNOSIS — E538 Deficiency of other specified B group vitamins: Secondary | ICD-10-CM | POA: Diagnosis not present

## 2022-05-29 DIAGNOSIS — B0229 Other postherpetic nervous system involvement: Secondary | ICD-10-CM

## 2022-05-29 DIAGNOSIS — M4000 Postural kyphosis, site unspecified: Secondary | ICD-10-CM

## 2022-05-29 DIAGNOSIS — N898 Other specified noninflammatory disorders of vagina: Secondary | ICD-10-CM | POA: Insufficient documentation

## 2022-05-29 DIAGNOSIS — R311 Benign essential microscopic hematuria: Secondary | ICD-10-CM

## 2022-05-29 DIAGNOSIS — Z1283 Encounter for screening for malignant neoplasm of skin: Secondary | ICD-10-CM | POA: Insufficient documentation

## 2022-05-29 DIAGNOSIS — R739 Hyperglycemia, unspecified: Secondary | ICD-10-CM | POA: Diagnosis not present

## 2022-05-29 DIAGNOSIS — Q2111 Secundum atrial septal defect: Secondary | ICD-10-CM | POA: Diagnosis not present

## 2022-05-29 DIAGNOSIS — H9113 Presbycusis, bilateral: Secondary | ICD-10-CM

## 2022-05-29 DIAGNOSIS — E669 Obesity, unspecified: Secondary | ICD-10-CM

## 2022-05-29 DIAGNOSIS — E039 Hypothyroidism, unspecified: Secondary | ICD-10-CM | POA: Diagnosis not present

## 2022-05-29 DIAGNOSIS — E7800 Pure hypercholesterolemia, unspecified: Secondary | ICD-10-CM | POA: Diagnosis not present

## 2022-05-29 DIAGNOSIS — M8580 Other specified disorders of bone density and structure, unspecified site: Secondary | ICD-10-CM | POA: Diagnosis not present

## 2022-05-29 DIAGNOSIS — H9313 Tinnitus, bilateral: Secondary | ICD-10-CM

## 2022-05-29 DIAGNOSIS — N281 Cyst of kidney, acquired: Secondary | ICD-10-CM

## 2022-05-29 DIAGNOSIS — N2889 Other specified disorders of kidney and ureter: Secondary | ICD-10-CM

## 2022-05-29 DIAGNOSIS — D1771 Benign lipomatous neoplasm of kidney: Secondary | ICD-10-CM

## 2022-05-29 DIAGNOSIS — E559 Vitamin D deficiency, unspecified: Secondary | ICD-10-CM

## 2022-05-29 LAB — HEMOGLOBIN A1C: Hgb A1c MFr Bld: 5.8 % (ref 4.6–6.5)

## 2022-05-29 LAB — COMPREHENSIVE METABOLIC PANEL
ALT: 13 U/L (ref 0–35)
AST: 16 U/L (ref 0–37)
Albumin: 4.2 g/dL (ref 3.5–5.2)
Alkaline Phosphatase: 84 U/L (ref 39–117)
BUN: 11 mg/dL (ref 6–23)
CO2: 28 mEq/L (ref 19–32)
Calcium: 9.8 mg/dL (ref 8.4–10.5)
Chloride: 104 mEq/L (ref 96–112)
Creatinine, Ser: 0.86 mg/dL (ref 0.40–1.20)
GFR: 67.57 mL/min (ref >60.00)
Glucose, Bld: 94 mg/dL (ref 70–99)
Potassium: 3.9 mEq/L (ref 3.5–5.1)
Sodium: 141 mEq/L (ref 135–145)
Total Bilirubin: 0.6 mg/dL (ref 0.2–1.2)
Total Protein: 7.3 g/dL (ref 6.0–8.3)

## 2022-05-29 LAB — LIPID PANEL
Cholesterol: 226 mg/dL — ABNORMAL HIGH (ref 0–200)
HDL: 50 mg/dL (ref >39.00)
LDL Cholesterol: 140 mg/dL — ABNORMAL HIGH (ref 0–99)
NonHDL: 175.96
Total CHOL/HDL Ratio: 5
Triglycerides: 182 mg/dL — ABNORMAL HIGH (ref 0.0–149.0)
VLDL: 36.4 mg/dL (ref 0.0–40.0)

## 2022-05-29 LAB — B12 AND FOLATE PANEL
Folate: 15.5 ng/mL (ref >5.9)
Vitamin B-12: 235 pg/mL (ref 211–911)

## 2022-05-29 MED ORDER — EZETIMIBE 10 MG PO TABS: 10.0000 mg | ORAL_TABLET | Freq: Every day | ORAL | 3 refills | Status: DC

## 2022-05-29 MED ORDER — PREMARIN 0.625 MG/GM VA CREA: 1.0000 | TOPICAL_CREAM | Freq: Every day | VAGINAL | 12 refills | Status: DC

## 2022-05-29 NOTE — Assessment & Plan Note (Signed)
Pt to obtain from pharmacy

## 2022-05-29 NOTE — Assessment & Plan Note (Signed)
rx premarin 0.625 mg twice a week to see if improvement

## 2022-05-29 NOTE — Assessment & Plan Note (Signed)
Cont f/u with audiologist Overdue, schedule appt

## 2022-05-29 NOTE — Assessment & Plan Note (Signed)
Repeat today pending results

## 2022-05-29 NOTE — Telephone Encounter (Signed)
MEDICATION: ezetimibe (ZETIA) 10 MG tablet  PHARMACY: [id number]- [place], [place] - [address]  Comments:   **Let patient know to contact pharmacy at the end of the day to make sure medication is ready. **  ** Please notify patient to allow 48-72 hours to process**  **Encourage patient to contact the pharmacy for refills or [facility]*

## 2022-05-29 NOTE — Assessment & Plan Note (Signed)
Cont f/u with audiologist

## 2022-05-29 NOTE — Assessment & Plan Note (Signed)
Cont f/u with urology

## 2022-05-29 NOTE — Assessment & Plan Note (Signed)
Due again 10/07/23 Vitamin d and calcium daily Weight bearing exercises

## 2022-05-29 NOTE — Assessment & Plan Note (Signed)
Reconsider gabapentin if still with ongoing pain

## 2022-05-29 NOTE — Assessment & Plan Note (Signed)
Work on diabetic diet and exercise as tolerated

## 2022-05-29 NOTE — Assessment & Plan Note (Signed)
Continue f/u with cardiologist as scheduled for monitoring

## 2022-05-29 NOTE — Assessment & Plan Note (Signed)
Stable

## 2022-05-29 NOTE — Assessment & Plan Note (Signed)
Continue daily vitamin d supplement

## 2022-05-29 NOTE — Patient Instructions (Addendum)
Recommend TDAP tetanus vaccination. Overdue.   Stop by the lab prior to leaving today. I will notify you of your results once received.   Recommendations on keeping yourself healthy:  - Exercise at least 30-45 minutes a day, 3-4 days a week.  - Eat a low-fat diet with lots of fruits and vegetables, up to 7-9 servings per day.  - Seatbelts can save your life. Wear them always.  - Smoke detectors on every level of your home, check batteries every year.  - Eye Doctor - have an eye exam every 1-2 years  - Safe sex - if you may be exposed to STDs, use a condom.  - Alcohol -  If you drink, do it moderately, less than 2 drinks per day.  - [place]. Choose someone to speak for you if you are not able.  - Depression is common in our stressful world.If you're feeling down or losing interest in things you normally enjoy, please come in for a visit.  - Violence - If anyone is threatening or hurting you, please call immediately.  Due to recent changes in healthcare laws, you may see results of your imaging and/or laboratory studies on MyChart before I have had a chance to review them.  I understand that in some cases there may be results that are confusing or concerning to you. Please understand that not all results are received at the same time and often I may need to interpret multiple results in order to provide you with the best plan of care or course of treatment. Therefore, I ask that you please give me 2 business days to thoroughly review all your results before contacting my office for clarification. Should we see a critical lab result, you will be contacted sooner.   I will see you again in one year for your annual comprehensive exam unless otherwise stated and or with acute concerns.  It was a pleasure seeing you today! Please do not hesitate to reach out with any questions and or concerns.  Regards,   [place] Maintenance, Female Adopting a healthy  lifestyle and getting preventive care are important in promoting health and wellness. Ask your health care provider about: The right schedule for you to have regular tests and exams. Things you can do on your own to prevent diseases and keep yourself healthy. What should I know about diet, weight, and exercise? Eat a healthy diet  Eat a diet that includes plenty of vegetables, fruits, low-fat dairy products, and lean protein. Do not eat a lot of foods that are high in solid fats, added sugars, or sodium. Maintain a healthy weight Body mass index (BMI) is used to identify weight problems. It estimates body fat based on height and weight. Your health care provider can help determine your BMI and help you achieve or maintain a healthy weight. Get regular exercise Get regular exercise. This is one of the most important things you can do for your health. Most adults should: Exercise for at least 150 minutes each week. The exercise should increase your heart rate and make you sweat (moderate-intensity exercise). Do strengthening exercises at least twice a week. This is in addition to the moderate-intensity exercise. Spend less time sitting. Even light physical activity can be beneficial. Watch cholesterol and blood lipids Have your blood tested for lipids and cholesterol at 72 years of age, then have this test every 5 years. Have your cholesterol levels checked more often if: Your  lipid or cholesterol levels are high. You are older than 72 years of age. You are at high risk for heart disease. What should I know about cancer screening? Depending on your health history and family history, you may need to have cancer screening at various ages. This may include screening for: Breast cancer. Cervical cancer. Colorectal cancer. Skin cancer. Lung cancer. What should I know about heart disease, diabetes, and high blood pressure? Blood pressure and heart disease High blood pressure causes heart  disease and increases the risk of stroke. This is more likely to develop in people who have high blood pressure readings or are overweight. Have your blood pressure checked: Every 3-5 years if you are 56-75 years of age. Every year if you are 78 years old or older. Diabetes Have regular diabetes screenings. This checks your fasting blood sugar level. Have the screening done: Once every three years after age 54 if you are at a normal weight and have a low risk for diabetes. More often and at a younger age if you are overweight or have a high risk for diabetes. What should I know about preventing infection? Hepatitis B If you have a higher risk for hepatitis B, you should be screened for this virus. Talk with your health care provider to find out if you are at risk for hepatitis B infection. Hepatitis C Testing is recommended for: Everyone born from [birth year] through 1966. Anyone with known risk factors for hepatitis C. Sexually transmitted infections (STIs) Get screened for STIs, including gonorrhea and chlamydia, if: You are sexually active and are younger than 72 years of age. You are older than 72 years of age and your health care provider tells you that you are at risk for this type of infection. Your sexual activity has changed since you were last screened, and you are at increased risk for chlamydia or gonorrhea. Ask your health care provider if you are at risk. Ask your health care provider about whether you are at high risk for HIV. Your health care provider may recommend a prescription medicine to help prevent HIV infection. If you choose to take medicine to prevent HIV, you should first get tested for HIV. You should then be tested every 3 months for as long as you are taking the medicine. Pregnancy If you are about to stop having your period (premenopausal) and you may become pregnant, seek counseling before you get pregnant. Take 400 to 800 micrograms (mcg) of folic acid every day if you  become pregnant. Ask for birth control (contraception) if you want to prevent pregnancy. Osteoporosis and menopause Osteoporosis is a disease in which the bones lose minerals and strength with aging. This can result in bone fractures. If you are 39 years old or older, or if you are at risk for osteoporosis and fractures, ask your health care provider if you should: Be screened for bone loss. Take a calcium or vitamin D supplement to lower your risk of fractures. Be given hormone replacement therapy (HRT) to treat symptoms of menopause. Follow these instructions at home: Alcohol use Do not drink alcohol if: Your health care provider tells you not to drink. You are pregnant, may be pregnant, or are planning to become pregnant. If you drink alcohol: Limit how much you have to: 0-1 drink a day. Know how much alcohol is in your drink. In the [place] one drink equals one 12 oz bottle of beer (355 mL), one 5 oz glass of wine (148 mL), or one 1  oz glass of hard liquor (44 mL). Lifestyle Do not use any products that contain nicotine or tobacco. These products include cigarettes, chewing tobacco, and vaping devices, such as e-cigarettes. If you need help quitting, ask your health care provider. Do not use street drugs. Do not share needles. Ask your health care provider for help if you need support or information about quitting drugs. General instructions Schedule regular health, dental, and eye exams. Stay current with your vaccines. Tell your health care provider if: You often feel depressed. You have ever been abused or do not feel safe at home. Summary Adopting a healthy lifestyle and getting preventive care are important in promoting health and wellness. Follow your health care provider's instructions about healthy diet, exercising, and getting tested or screened for diseases. Follow your health care provider's instructions on monitoring your cholesterol and blood pressure. This information  is not intended to replace advice given to you by your health care provider. Make sure you discuss any questions you have with your health care provider. Document Revised: 03/07/2021 Document Reviewed: 03/07/2021 Elsevier Patient Education  [place].  Hearing Loss Hearing loss is a partial or total loss of the ability to hear. This can be temporary or permanent, and it can happen in one or both ears. There are two types of hearing loss. You can have just one type or both types. You may have a problem with: Damage to your hearing nerves (sensorineural hearing loss). This type of hearing loss is more likely to be permanent. A hearing aid is often the best treatment. Sound getting to your inner ear (conductive hearing loss). This type of hearing loss can usually be treated medically or surgically. Medical care is necessary to treat hearing loss properly and to prevent the condition from getting worse. Your hearing may partially or completely come back, depending on what caused your hearing loss and how severe it is. In some cases, hearing loss is permanent. What are the causes? Common causes of hearing loss include: Too much wax in the ear canal. Infection of the ear canal or middle ear. Fluid in the middle ear. Injury to the ear or surrounding area. An object stuck in the ear. A history of prolonged exposure to loud sounds, such as music. Less common causes of hearing loss include: Tumors in the ear. Viral or bacterial infections, such as meningitis. A hole in the eardrum (perforated eardrum). Problems with the hearing nerve that sends signals between the brain and the ear. Certain medicines. What are the signs or symptoms? Symptoms of this condition may include: Difficulty telling the difference between sounds. Difficulty following a conversation when there is background noise. Lack of response to sounds in your environment. This may be most noticeable when you do not respond to  startling sounds. Needing to turn up the volume on the television, radio, or other devices. Ringing in the ears. Dizziness. How is this diagnosed? This condition is diagnosed based on: A physical exam. A hearing test (audiometry). The test will be performed by a hearing specialist (audiologist). Imaging tests, such as an MRI or CT scan. You may also be referred to an ear, nose, and throat (ENT) specialist (otolaryngologist). How is this treated? Treatment for hearing loss may include: Earwax removal. Medicines to treat or prevent infection (antibiotics). Medicines to reduce inflammation (corticosteroids). Hearing aids or assistive listening devices. Follow these instructions at home: If you were prescribed an antibiotic medicine, take it as told by your health care provider. Do not  stop taking the antibiotic even if you start to feel better. Take over-the-counter and prescription medicines only as told by your health care provider. Avoid loud noises. Use hearing protection if you are exposed to loud noises or work in a noisy environment. Return to your normal activities as told by your health care provider. Ask your health care provider what activities are safe for you. Keep all follow-up visits. This is important. Contact a health care provider if: You feel dizzy. You develop new symptoms. You vomit or feel nauseous. You have a fever. Get help right away if: You develop sudden changes in your vision. You have severe ear pain. You have new or increased weakness. You have a severe headache. Summary Hearing loss is a decreased ability to hear sounds around you. It can be temporary or permanent. Treatment will depend on the cause of your hearing loss. It may include earwax removal, medicines, or a hearing aid. Your hearing may partially or completely come back, depending on what caused your hearing loss and how severe it is. Keep all follow-up visits. This is important. This  information is not intended to replace advice given to you by your health care provider. Make sure you discuss any questions you have with your health care provider. Document Revised: 08/25/2021 Document Reviewed: 08/25/2021 Elsevier Patient Education  [place].

## 2022-05-29 NOTE — Assessment & Plan Note (Signed)
Continue b12 daily otc

## 2022-05-29 NOTE — Assessment & Plan Note (Signed)
Controlled Continue zetia 10 mg  Work on low chol diet

## 2022-05-29 NOTE — Progress Notes (Signed)
Subjective:     Nicole Daniels is a 71 y.o. female who presents for Medicare Annual (Subsequent) preventive examination.  Right eye infection that moved to left eye, went to opthalmologist and was given eye drops for the infection. Was given antbx eye drops. Went back last week, infection improved but bad dry eyes. Using systane eyedrops to see how this goes if not will f/u with opthamologist.   Colonoscopy: 06/16/21 every three years with polyps  Mammogram: 10/06/21 negative  Dexa: osteopenia , every two years. 10/06/21 Pneumonia vaccination: never had  Shingrix: due but still with shinglex sx with PHN TDAP: pt overdue will get at pharmacy   Chronic problems addressed today:  Patent foramen ovale: followed by cardiologist, however overdue. Last saw in 2021, calcium score 0.   Right kidney cyst, hematuria: cystourethroscopy with Urologist, Dr. Hollice Espy.    Vitamin b12 def: not currently taking anything for this.  Lab Results  Component Value Date   HGBA1C 5.8 05/29/2022    Lab Results  Component Value Date   VITAMINB12 235 05/29/2022    Review of Systems       Review of Systems  Constitutional:  Negative for chills and fatigue.  Respiratory:  Negative for cough and shortness of breath.   Cardiovascular:  Negative for chest pain and leg swelling.  Gastrointestinal:  Negative for diarrhea and nausea.  Genitourinary:  Negative for difficulty urinating, menstrual problem and vaginal bleeding.  Neurological:  Negative for dizziness and headaches.  Psychiatric/Behavioral:  Negative for agitation and sleep disturbance.   All other systems reviewed and are negative.      Objective:   Physical Exam Constitutional:      General: She is not in acute distress.    Appearance: Normal appearance. She is not ill-appearing, toxic-appearing or diaphoretic.  Cardiovascular:     Rate and Rhythm: Normal rate and regular rhythm.  Pulmonary:     Effort: Pulmonary effort is  normal.  Musculoskeletal:     Right lower leg: No edema.     Left lower leg: No edema.  Neurological:     General: No focal deficit present.     Mental Status: She is alert and oriented to person, place, and time.  Psychiatric:        Mood and Affect: Mood normal.        Behavior: Behavior normal.        Thought Content: Thought content normal.        Judgment: Judgment normal.    Today's Vitals   05/29/22 0803  BP: 138/72  Pulse: 71  Resp: 16  Temp: 98.7 F (37.1 C)  SpO2: 98%  Weight: 181 lb (82.1 kg)  Height: 5' 4.5" (1.638 m)   Body mass index is 30.59 kg/m.     02/22/2022    5:16 PM 07/10/2021   12:28 PM 07/05/2021    3:10 PM 03/08/2021    9:04 AM 03/03/2020   10:00 AM 02/26/2019    9:27 AM 12/23/2018    4:55 PM  Advanced Directives  Does Patient Have a Medical Advance Directive? No No No Yes Yes Yes Yes  Type of Scientist, research (medical);Living will [place];Living will [place];Living will Living will;Healthcare Power of [place];Out of facility DNR (pink MOST or yellow form)  Does patient want to make changes to medical advance directive?     No - Patient declined No - Patient declined No -  Patient declined  Copy of [place] in Chart?    Yes - validated most recent copy scanned in chart (See row information) Yes - validated most recent copy scanned in chart (See row information) Yes - validated most recent copy scanned in chart (See row information)   Would patient like information on creating a medical advance directive? No - Patient declined No - Patient declined No - Patient declined        Current Medications (verified) Outpatient Encounter Medications as of 05/29/2022  Medication Sig  . Cholecalciferol (VITAMIN D3) 2000 units TABS Take 2,000 Units by mouth daily.  Marland Kitchen conjugated estrogens (PREMARIN) vaginal cream Place 1 Applicatorful vaginally daily. Place 1 applicatorful vaginally  two times a week  . fluticasone (FLONASE) 50 MCG/ACT nasal spray Place 1 spray into both nostrils as needed for allergies.   Marland Kitchen levothyroxine (SYNTHROID) 50 MCG tablet TAKE 1 TABLET BY MOUTH EVERY DAY  . [DISCONTINUED] ezetimibe (ZETIA) 10 MG tablet Take 1 tablet (10 mg total) by mouth daily.  . [DISCONTINUED] omeprazole (PRILOSEC) 20 MG capsule Take 1 capsule (20 mg total) by mouth daily.  . [DISCONTINUED] tobramycin-dexamethasone (TOBRADEX) ophthalmic solution Place 1 drop into the right eye 3 (three) times daily. (Patient not taking: Reported on 05/29/2022)  . [DISCONTINUED] 0.9 %  sodium chloride infusion    No facility-administered encounter medications on file as of 05/29/2022.    Allergies (verified) Simvastatin, Statins, Bactrim [sulfamethoxazole-trimethoprim], and Sertraline   History: Past Medical History:  Diagnosis Date  . Abdominal pain, right lower quadrant   . Acquired cyst of kidney 01/08/2012   pt denies history of   . Acute sinusitis, unspecified   . Adenomatous colon polyp   . Allergy   . Angiomyolipoma    kidney  . Anxiety    OCCASIONAL  . Chest pain   . Chest pain, unspecified   . Depressive disorder, not elsewhere classified 01/08/2012   not currently   . Diverticulosis   . Dizziness and giddiness   . Dysfunction of eustachian tube   . H/O Spinal surgery 02/20/2017  . Hemorrhoids   . History of diverticulitis of colon   . History of fusion of cervical spine 04/25/2018  . IBS (irritable bowel syndrome)   . Liver lesion 2011  . Neoplasm of skin of face    malignant  . OA (osteoarthritis)    knee, NECK  . Obesity, unspecified   . Ostium secundum type atrial septal defect   . Other and unspecified hyperlipidemia   . Other malaise and fatigue   . Palpitations   . Shortness of breath   . Thyroid disease   . Unspecified essential hypertension    pt states she does not have HTN  . Unspecified sinusitis (chronic)   . Unspecified vitamin D deficiency     Past Surgical History:  Procedure Laterality Date  . ABDOMINAL HYSTERECTOMY    . BREAST REDUCTION SURGERY  1990  . CERVICAL DISC SURGERY     Dr.Nudleman  . CHOLECYSTECTOMY    . COLONOSCOPY    . DILATION AND CURETTAGE OF UTERUS    . EYE SURGERY Bilateral 07/30/2017   cataract sx  . FLEXIBLE SIGMOIDOSCOPY  01/08/2012   Procedure: FLEXIBLE SIGMOIDOSCOPY;  Surgeon: Lafayette Dragon, MD;  Location: [facility];  Service: Endoscopy;  Laterality: N/A;  . HEMORRHOID SURGERY    . KNEE ARTHROTOMY     pt states no   . LAMINECTOMY N/A 01/31/2017   Procedure: Thoracic Laminectomy  and marsupialization of arachnoid cyst;  Surgeon: Jovita Gamma, MD;  Location: [facility];  Service: Neurosurgery;  Laterality: N/A;  . LEFT OOPHORECTOMY  1976  . NECK SURGERY N/A 03/2018  . PARTIAL HYSTERECTOMY     right ovary remains  . REDUCTION MAMMAPLASTY    . TONSILLECTOMY  1972  . UPPER GASTROINTESTINAL ENDOSCOPY     Family History  Problem Relation Age of Onset  . Hypertension Mother   . Diabetes Mother   . Cirrhosis Father   . Heart disease Father   . Hypertension Sister   . Diabetes Sister   . Heart disease Brother        x 2  . Breast cancer Cousin   . Diabetes Cousin   . Diabetes Other        aunt  . Stroke Other        aunt  . Colon cancer Neg Hx   . Esophageal cancer Neg Hx   . Stomach cancer Neg Hx   . Rectal cancer Neg Hx    Social History   Socioeconomic History  . Marital status: Widowed    Spouse name: Not on file  . Number of children: 2  . Years of education: Not on file  . Highest education level: Not on file  Occupational History  . Occupation: retired  Tobacco Use  . Smoking status: Never  . Smokeless tobacco: Never  Vaping Use  . Vaping Use: Never used  Substance and Sexual Activity  . Alcohol use: No  . Drug use: No  . Sexual activity: Yes    Partners: Male  Other Topics Concern  . Not on file  Social History Narrative   Daily caffeine    Social  Determinants of Health   Financial Resource Strain: Low Risk  (03/08/2021)   Overall Financial Resource Strain (CARDIA)   . Difficulty of Paying Living Expenses: Not hard at all  Food Insecurity: No Food Insecurity (03/08/2021)   Hunger Vital Sign   . Worried About Charity fundraiser in the Last Year: Never true   . Ran Out of Food in the Last Year: Never true  Transportation Needs: No Transportation Needs (03/08/2021)   PRAPARE - Transportation   . Lack of Transportation (Medical): No   . Lack of Transportation (Non-Medical): No  Physical Activity: Sufficiently Active (03/08/2021)   Exercise Vital Sign   . Days of Exercise per Week: 7 days   . Minutes of Exercise per Session: 30 min  Stress: No Stress Concern Present (03/08/2021)   [place]   . Feeling of Stress : Not at all  Social Connections: Moderately Integrated (03/08/2021)   Social Connection and Isolation Panel [NHANES]   . Frequency of Communication with Friends and Family: More than three times a week   . Frequency of Social Gatherings with Friends and Family: More than three times a week   . Attends Religious Services: More than 4 times per year   . Active Member of Clubs or Organizations: Yes   . Attends Archivist Meetings: More than 4 times per year   . Marital Status: Widowed    Tobacco Counseling Counseling given: Not Answered          Diabetic?no          Activities of Daily Living    05/29/2022    9:16 AM  In your present state of health, do you have any difficulty performing the  following activities:  Hearing? 1  Vision? 0  Difficulty concentrating or making decisions? 0  Walking or climbing stairs? 0  Dressing or bathing? 0  Doing errands, shopping? 0  Preparing Food and eating ? N  Using the Toilet? N  In the past six months, have you accidently leaked urine? N  Do you have problems with loss of bowel control? N   Managing your Medications? N  Managing your Finances? N  Housekeeping or managing your Housekeeping? N    Patient Care Team: Eugenia Pancoast, FNP as PCP - General (Family Medicine) Earnie Larsson, MD as Consulting Physician (Neurosurgery) Hollice Espy, MD as Consulting Physician (Urology) Buford Dresser, MD as Consulting Physician (Cardiology)  Indicate any recent Medical Services you may have received from other than [facility] providers in the past year (date may be approximate).     Assessment:   This is a routine wellness examination for Sabriya.  Hearing/Vision screen Hearing Screening   '1000Hz'$  '2000Hz'$  '4000Hz'$  '5000Hz'$   Right ear Fail Fail Pass Fail  Left ear Pass Pass Fail Fail   Vision Screening   Right eye Left eye Both eyes  Without correction '20/40 20/15 20/13 '$  With correction     Comments: Has had an eye exam in the last year. [facility]   Dietary issues and exercise activities discussed:     Goals Addressed   None   Depression Screen    05/29/2022    8:44 AM 03/08/2021    9:06 AM 03/03/2020   10:02 AM 02/26/2019    9:28 AM 02/20/2018    9:27 AM 11/21/2017    8:33 AM 01/01/2017   11:46 AM  PHQ 2/9 Scores  PHQ - 2 Score 2 0 0 0 0 3 0  PHQ- 9 Score 4     4     Fall Risk    05/29/2022    8:03 AM 03/08/2021    9:05 AM 03/03/2020   10:02 AM 02/26/2019    9:28 AM 02/20/2018    9:27 AM  Fall Risk   Falls in the past year? 0 0 0 0 No  Number falls in past yr: 0 0 0    Injury with Fall? 0 0 0    Risk for fall due to : No Fall Risks      Follow up  Falls prevention discussed Education provided;Falls prevention discussed      FALL RISK PREVENTION PERTAINING TO THE HOME:  Any stairs in or around the home? Yes  If so, are there any without handrails? No  Home free of loose throw rugs in walkways, pet beds, electrical cords, etc? Yes  Adequate lighting in your home to reduce risk of falls? Yes   ASSISTIVE DEVICES UTILIZED TO PREVENT FALLS:  Life  alert? No  Use of a cane, walker or w/c? No  Grab bars in the bathroom? No  Shower chair or bench in shower? No  Elevated toilet seat or a handicapped toilet? No   TIMED UP AND GO:  Was the test performed? Yes .  Length of time to ambulate 10 feet: 10 sec.   Gait steady and fast without use of assistive device  Cognitive Function:    02/20/2018    9:43 AM 01/01/2017   11:47 AM  MMSE - Mini Mental State Exam  Orientation to time 5 5  Orientation to Place 5 5  Registration 3 3  Attention/ Calculation 5 0  Recall 3 3  Language- name 2 objects  2 0  Language- repeat 1 1  Language- follow 3 step command 3 3  Language- read & follow direction 1 0  Write a sentence 1 0  Copy design 1 0  Total score 30 20        Immunizations Immunization History  Administered Date(s) Administered  . Influenza,inj,Quad PF,6+ Mos 09/01/2015  . PFIZER(Purple Top)SARS-COV-2 Vaccination 06/25/2020, 07/16/2020  . Pneumococcal Polysaccharide-23 05/29/2022  . Td 10/30/1988, 08/25/2008    TDAP status: Due, Education has been provided regarding the importance of this vaccine. Advised may receive this vaccine at local pharmacy or Health Dept. Aware to provide a copy of the vaccination record if obtained from local pharmacy or Health Dept. Verbalized acceptance and understanding.  Flu Vaccine status: Up to date  Pneumococcal vaccine status: Completed during today's visit.  Covid-19 vaccine status: Information provided on how to obtain vaccines.   Qualifies for Shingles Vaccine? Yes   Zostavax completed No   Shingrix Completed?: No.    Education has been provided regarding the importance of this vaccine. Patient has been advised to call insurance company to determine out of pocket expense if they have not yet received this vaccine. Advised may also receive vaccine at local pharmacy or Health Dept. Verbalized acceptance and understanding.  Screening Tests Health Maintenance  Topic Date Due  .  TETANUS/TDAP  08/25/2018  . COVID-19 Vaccine (3 - Pfizer series) 09/10/2020  . Zoster Vaccines- Shingrix (1 of 2) 08/29/2022 (Originally 02/29/2000)  . INFLUENZA VACCINE  01/28/2024 (Originally 05/30/2022)  . Pneumonia Vaccine 8+ Years old (2 - PCV) 05/30/2023  . MAMMOGRAM  10/07/2023  . COLONOSCOPY (Pts 45-34yr Insurance coverage will need to be confirmed)  06/17/2031  . DEXA SCAN  Completed  . Hepatitis C Screening  Completed  . HPV VACCINES  Aged Out    Health Maintenance  Health Maintenance Due  Topic Date Due  . TETANUS/TDAP  08/25/2018  . COVID-19 Vaccine (3 - Pfizer series) 09/10/2020    Colorectal cancer screening: Type of screening: Colonoscopy. Completed 06/16/2021. Repeat every 3 years  Mammogram status: Completed 10/06/21. Repeat every year  Bone Density status: Completed 10/06/21. Results reflect: Bone density results: OSTEOPENIA. Repeat every 2 years.  Lung Cancer Screening: (Low Dose CT Chest recommended if Age 584-80years, 30 pack-year currently smoking OR have quit w/in 15years.) does not qualify.   Lung Cancer Screening Referral: n/a    Additional Screening:  Hepatitis C Screening: does not qualify; Completed unknown date in the past   Vision Screening: Recommended annual ophthalmology exams for early detection of glaucoma and other disorders of the eye. Is the patient up to date with their annual eye exam?  Yes  Who is the provider or what is the name of the office in which the patient attends annual eye exams? [facility]  If pt is not established with a provider, would they like to be referred to a provider to establish care?  N/a .   Dental Screening: Recommended annual dental exams for proper oral hygiene, up to date.   Community Resource Referral / Chronic Care Management: CRR required this visit?  No   CCM required this visit?  No      Plan:   Problem List Items Addressed This Visit       Cardiovascular and Mediastinum   PATENT FORAMEN  OVALE    Continue f/u with cardiologist as scheduled for monitoring        Endocrine   Hypothyroidism    continue  levothyroxine         Nervous and Auditory   HZV (herpes zoster virus) post herpetic neuralgia    Reconsider gabapentin if still with ongoing pain      Cervical radiculopathy    stable      Presbycusis of both ears    Cont f/u with audiologist Overdue, schedule appt        Musculoskeletal and Integument   Osteopenia    Due again 10/07/23 Vitamin d and calcium daily Weight bearing exercises       Postural kyphosis    Stable         Genitourinary   Cyst of right kidney    Cont f/u with urology       Angiomyolipoma of right kidney    Cont f/u with urology       Benign essential microscopic hematuria    Cont f/u with urology       Vaginal dryness    rx premarin 0.625 mg twice a week to see if improvement       Relevant Medications   conjugated estrogens (PREMARIN) vaginal cream   RESOLVED: Vaginal atrophy   Relevant Medications   conjugated estrogens (PREMARIN) vaginal cream     Other   Vitamin D deficiency    Continue daily vitamin d supplement       Mixed hyperlipidemia    Controlled Continue zetia 10 mg  Work on low chol diet      Obesity (BMI 30.0-34.9)    Work on low chol diet exercise as tolerated      Hypokalemia    Repeat today pending results      Left kidney mass    Cont f/u with urology       B12 deficiency    Continue b12 daily otc      Tinnitus aurium, bilateral    Cont f/u with audiologist      Screening for skin cancer - Primary    amb ref dermatology for annual screening      Relevant Orders   Ambulatory referral to Dermatology   Hyperglycemia    Work on diabetic diet and exercise as tolerated      Relevant Orders   Hemoglobin A1c (Completed)   Need for shingles vaccine    Pt to obtain from pharmacy       Need for 23-polyvalent pneumococcal polysaccharide vaccine    pneumonococcal 23  vaccine administered in office Pt tolerated procedure well  Verbal consent obtained prior to administration  Handout given in regards to vaccination.        Encounter for Medicare annual wellness exam    Patient Counseling(The following topics were reviewed):  Preventative care handout given to pt  Health maintenance and immunizations reviewed. Please refer to Health maintenance section. Pt advised on safe sex, wearing seatbelts in car, and proper nutrition labwork ordered today for annual Dental health: Discussed importance of regular tooth brushing, flossing, and dental visits.        RESOLVED: Vitamin B12 deficiency   Relevant Orders   B12 and Folate Panel (Completed)   RESOLVED: Encounter for vaccination   Relevant Orders   Pneumococcal 23 (Completed)   RESOLVED: Elevated LDL cholesterol level   Relevant Orders   Comprehensive metabolic panel (Completed)   Lipid panel (Completed)    I have personally reviewed and noted the following in the patient's chart:   Medical and social history Use of alcohol, tobacco or illicit drugs  Current medications and  supplements including opioid prescriptions.  Functional ability and status Nutritional status Physical activity Advanced directives List of other physicians Hospitalizations, surgeries, and ER visits in previous 12 months Vitals Screenings to include cognitive, depression, and falls Referrals and appointments  In addition, I have reviewed and discussed with patient certain preventive protocols, quality metrics, and best practice recommendations. A written personalized care plan for preventive services as well as general preventive health recommendations were provided to patient.     Eugenia Pancoast, FNP   07/24/2022

## 2022-05-29 NOTE — Assessment & Plan Note (Signed)
amb ref dermatology for annual screening

## 2022-05-29 NOTE — Assessment & Plan Note (Addendum)
pneumonococcal 23 vaccine administered in office Pt tolerated procedure well  Verbal consent obtained prior to administration  Handout given in regards to vaccination.

## 2022-05-29 NOTE — Assessment & Plan Note (Signed)
Work on [employer] exercise as tolerated

## 2022-05-29 NOTE — Assessment & Plan Note (Signed)
Patient Counseling(The following topics were reviewed): ? Preventative care handout given to pt  ?Health maintenance and immunizations reviewed. Please refer to Health maintenance section. ?Pt advised on safe sex, wearing seatbelts in car, and proper nutrition ?labwork ordered today for annual ?Dental health: Discussed importance of regular tooth brushing, flossing, and dental visits. ? ? ?

## 2022-05-29 NOTE — Assessment & Plan Note (Signed)
stable °

## 2022-05-29 NOTE — Assessment & Plan Note (Signed)
continue levothyroxine

## 2022-05-30 NOTE — Progress Notes (Signed)
B12 improving slightly Is she still taking b12 daily?  Cholesterol is a bit elevated Was she fasting at visit

## 2022-05-31 ENCOUNTER — Telehealth: Payer: Self-pay | Admitting: Family

## 2022-05-31 NOTE — Telephone Encounter (Signed)
Spoke to Pt and gave results.

## 2022-05-31 NOTE — Telephone Encounter (Signed)
Patient returned her call she missed. Thank you!

## 2022-07-29 ENCOUNTER — Other Ambulatory Visit: Payer: Self-pay | Admitting: Family

## 2022-07-29 DIAGNOSIS — E039 Hypothyroidism, unspecified: Secondary | ICD-10-CM

## 2022-08-16 ENCOUNTER — Ambulatory Visit (INDEPENDENT_AMBULATORY_CARE_PROVIDER_SITE_OTHER): Payer: Medicare PPO

## 2022-08-16 VITALS — Wt 181.0 lb

## 2022-08-16 DIAGNOSIS — Z0000 Encounter for general adult medical examination without abnormal findings: Secondary | ICD-10-CM

## 2022-08-16 NOTE — Patient Instructions (Signed)
Nicole Daniels , Thank you for taking time to come for your Medicare Wellness Visit. I appreciate your ongoing commitment to your health goals. Please review the following plan we discussed and let me know if I can assist you in the future.   These are the goals we discussed:  Goals      Patient Stated     Eat healthier & increase activity     Patient Stated     Maintain health         This is a list of the screening recommended for you and due dates:  Health Maintenance  Topic Date Due   Tetanus Vaccine  08/25/2018   COVID-19 Vaccine (3 - Pfizer series) 09/10/2020   Zoster (Shingles) Vaccine (1 of 2) 08/29/2022*   Flu Shot  01/28/2024*   Pneumonia Vaccine (2 - PCV) 05/30/2023   Mammogram  10/07/2023   Colon Cancer Screening  06/17/2031   DEXA scan (bone density measurement)  Completed   Hepatitis C Screening: USPSTF Recommendation to screen - Ages 25-79 yo.  Completed   HPV Vaccine  Aged Out  *Topic was postponed. The date shown is not the original due date.    Advanced directives: copies in chart   Conditions/risks identified: maintain health   Next appointment: Follow up in one year for your annual wellness visit    Preventive Care 65 Years and Older, Female Preventive care refers to lifestyle choices and visits with your health care provider that can promote health and wellness. What does preventive care include? A yearly physical exam. This is also called an annual well check. Dental exams once or twice a year. Routine eye exams. Ask your health care provider how often you should have your eyes checked. Personal lifestyle choices, including: Daily care of your teeth and gums. Regular physical activity. Eating a healthy diet. Avoiding tobacco and drug use. Limiting alcohol use. Practicing safe sex. Taking low-dose aspirin every day. Taking vitamin and mineral supplements as recommended by your health care provider. What happens during an annual well check? The  services and screenings done by your health care provider during your annual well check will depend on your age, overall health, lifestyle risk factors, and family history of disease. Counseling  Your health care provider may ask you questions about your: Alcohol use. Tobacco use. Drug use. Emotional well-being. Home and relationship well-being. Sexual activity. Eating habits. History of falls. Memory and ability to understand (cognition). Work and work Statistician. Reproductive health. Screening  You may have the following tests or measurements: Height, weight, and BMI. Blood pressure. Lipid and cholesterol levels. These may be checked every 5 years, or more frequently if you are over 24 years old. Skin check. Lung cancer screening. You may have this screening every year starting at age 71 if you have a 30-pack-year history of smoking and currently smoke or have quit within the past 15 years. Fecal occult blood test (FOBT) of the stool. You may have this test every year starting at age 44. Flexible sigmoidoscopy or colonoscopy. You may have a sigmoidoscopy every 5 years or a colonoscopy every 10 years starting at age 43. Hepatitis C blood test. Hepatitis B blood test. Sexually transmitted disease (STD) testing. Diabetes screening. This is done by checking your blood sugar (glucose) after you have not eaten for a while (fasting). You may have this done every 1-3 years. Bone density scan. This is done to screen for osteoporosis. You may have this done starting at age 20.  Mammogram. This may be done every 1-2 years. Talk to your health care provider about how often you should have regular mammograms. Talk with your health care provider about your test results, treatment options, and if necessary, the need for more tests. Vaccines  Your health care provider may recommend certain vaccines, such as: Influenza vaccine. This is recommended every year. Tetanus, diphtheria, and acellular  pertussis (Tdap, Td) vaccine. You may need a Td booster every 10 years. Zoster vaccine. You may need this after age 53. Pneumococcal 13-valent conjugate (PCV13) vaccine. One dose is recommended after age 31. Pneumococcal polysaccharide (PPSV23) vaccine. One dose is recommended after age 29. Talk to your health care provider about which screenings and vaccines you need and how often you need them. This information is not intended to replace advice given to you by your health care provider. Make sure you discuss any questions you have with your health care provider. Document Released: 11/12/2015 Document Revised: 07/05/2016 Document Reviewed: 08/17/2015 Elsevier Interactive Patient Education  2017 [employer] Prevention in the Home Falls can cause injuries. They can happen to people of all ages. There are many things you can do to make your home safe and to help prevent falls. What can I do on the outside of my home? Regularly fix the edges of walkways and driveways and fix any cracks. Remove anything that might make you trip as you walk through a door, such as a raised step or threshold. Trim any bushes or trees on the path to your home. Use bright outdoor lighting. Clear any walking paths of anything that might make someone trip, such as rocks or tools. Regularly check to see if handrails are loose or broken. Make sure that both sides of any steps have handrails. Any raised decks and porches should have guardrails on the edges. Have any leaves, snow, or ice cleared regularly. Use sand or salt on walking paths during winter. Clean up any spills in your garage right away. This includes oil or grease spills. What can I do in the bathroom? Use night lights. Install grab bars by the toilet and in the tub and shower. Do not use towel bars as grab bars. Use non-skid mats or decals in the tub or shower. If you need to sit down in the shower, use a plastic, non-slip stool. Keep the floor  dry. Clean up any water that spills on the floor as soon as it happens. Remove soap buildup in the tub or shower regularly. Attach bath mats securely with double-sided non-slip rug tape. Do not have throw rugs and other things on the floor that can make you trip. What can I do in the bedroom? Use night lights. Make sure that you have a light by your bed that is easy to reach. Do not use any sheets or blankets that are too big for your bed. They should not hang down onto the floor. Have a firm chair that has side arms. You can use this for support while you get dressed. Do not have throw rugs and other things on the floor that can make you trip. What can I do in the kitchen? Clean up any spills right away. Avoid walking on wet floors. Keep items that you use a lot in easy-to-reach places. If you need to reach something above you, use a strong step stool that has a grab bar. Keep electrical cords out of the way. Do not use floor polish or wax that makes floors slippery. If  you must use wax, use non-skid floor wax. Do not have throw rugs and other things on the floor that can make you trip. What can I do with my stairs? Do not leave any items on the stairs. Make sure that there are handrails on both sides of the stairs and use them. Fix handrails that are broken or loose. Make sure that handrails are as long as the stairways. Check any carpeting to make sure that it is firmly attached to the stairs. Fix any carpet that is loose or worn. Avoid having throw rugs at the top or bottom of the stairs. If you do have throw rugs, attach them to the floor with carpet tape. Make sure that you have a light switch at the top of the stairs and the bottom of the stairs. If you do not have them, ask someone to add them for you. What else can I do to help prevent falls? Wear shoes that: Do not have high heels. Have rubber bottoms. Are comfortable and fit you well. Are closed at the toe. Do not wear  sandals. If you use a stepladder: Make sure that it is fully opened. Do not climb a closed stepladder. Make sure that both sides of the stepladder are locked into place. Ask someone to hold it for you, if possible. Clearly mark and make sure that you can see: Any grab bars or handrails. First and last steps. Where the edge of each step is. Use tools that help you move around (mobility aids) if they are needed. These include: Canes. Walkers. Scooters. Crutches. Turn on the lights when you go into a dark area. Replace any light bulbs as soon as they burn out. Set up your furniture so you have a clear path. Avoid moving your furniture around. If any of your floors are uneven, fix them. If there are any pets around you, be aware of where they are. Review your medicines with your doctor. Some medicines can make you feel dizzy. This can increase your chance of falling. Ask your doctor what other things that you can do to help prevent falls. This information is not intended to replace advice given to you by your health care provider. Make sure you discuss any questions you have with your health care provider. Document Released: 08/12/2009 Document Revised: 03/23/2016 Document Reviewed: 11/20/2014 Elsevier Interactive Patient Education  2017 [employer].

## 2022-08-16 NOTE — Progress Notes (Signed)
I connected with  Nicole Daniels on 08/16/22 by a audio enabled telemedicine application and verified that I am speaking with the correct person using two identifiers.  Patient Location: Home  Provider Location: Home Office  I discussed the limitations of evaluation and management by telemedicine. The patient expressed understanding and agreed to proceed.   Subjective:   Nicole Daniels is a 71 y.o. female who presents for Medicare Annual (Subsequent) preventive examination.  Review of Systems     Cardiac Risk Factors include: advanced age (>78mn, >>76women);dyslipidemia;obesity (BMI >30kg/m2)     Objective:    Today's Vitals   08/16/22 1106  Weight: 181 lb (82.1 kg)   Body mass index is 30.59 kg/m.     08/16/2022   11:11 AM 02/22/2022    5:16 PM 07/10/2021   12:28 PM 07/05/2021    3:10 PM 03/08/2021    9:04 AM 03/03/2020   10:00 AM 02/26/2019    9:27 AM  Advanced Directives  Does Patient Have a Medical Advance Directive? Yes No No No Yes Yes Yes  Type of AParamedicof ACarefreeLiving will    [place] will [place]Living will HP[place]Living will  Does patient want to make changes to medical advance directive? No - Patient declined     No - Patient declined No - Patient declined  Copy of HOkatonin Chart? Yes - validated most recent copy scanned in chart (See row information)    Yes - validated most recent copy scanned in chart (See row information) Yes - validated most recent copy scanned in chart (See row information) Yes - validated most recent copy scanned in chart (See row information)  Would patient like information on creating a medical advance directive?  No - Patient declined No - Patient declined No - Patient declined       Current Medications (verified) Outpatient Encounter Medications as of 08/16/2022  Medication Sig   Cholecalciferol (VITAMIN  D3) 2000 units TABS Take 2,000 Units by mouth daily.   conjugated estrogens (PREMARIN) vaginal cream Place 1 Applicatorful vaginally daily. Place 1 applicatorful vaginally two times a week   Cyanocobalamin (VITAMIN B 12 PO) Take by mouth.   ezetimibe (ZETIA) 10 MG tablet Take 1 tablet (10 mg total) by mouth daily.   fluticasone (FLONASE) 50 MCG/ACT nasal spray Place 1 spray into both nostrils as needed for allergies.    levothyroxine (SYNTHROID) 50 MCG tablet TAKE 1 TABLET BY MOUTH EVERY DAY   No facility-administered encounter medications on file as of 08/16/2022.    Allergies (verified) Simvastatin, Statins, Bactrim [sulfamethoxazole-trimethoprim], and Sertraline   History: Past Medical History:  Diagnosis Date   Abdominal pain, right lower quadrant    Acquired cyst of kidney 01/08/2012   pt denies history of    Acute sinusitis, unspecified    Adenomatous colon polyp    Allergy    Angiomyolipoma    kidney   Anxiety    OCCASIONAL   Chest pain    Chest pain, unspecified    Depressive disorder, not elsewhere classified 01/08/2012   not currently    Diverticulosis    Dizziness and giddiness    Dysfunction of eustachian tube    H/O Spinal surgery 02/20/2017   Hemorrhoids    History of diverticulitis of colon    History of fusion of cervical spine 04/25/2018   IBS (irritable bowel syndrome)    Liver lesion 2011  Neoplasm of skin of face    malignant   OA (osteoarthritis)    knee, NECK   Obesity, unspecified    Ostium secundum type atrial septal defect    Other and unspecified hyperlipidemia    Other malaise and fatigue    Palpitations    Shortness of breath    Thyroid disease    Unspecified essential hypertension    pt states she does not have HTN   Unspecified sinusitis (chronic)    Unspecified vitamin D deficiency    Past Surgical History:  Procedure Laterality Date   ABDOMINAL HYSTERECTOMY     BREAST REDUCTION SURGERY  1990   CERVICAL [place]   CHOLECYSTECTOMY     COLONOSCOPY     DILATION AND CURETTAGE OF UTERUS     EYE SURGERY Bilateral 07/30/2017   cataract sx   FLEXIBLE SIGMOIDOSCOPY  01/08/2012   Procedure: FLEXIBLE SIGMOIDOSCOPY;  Surgeon: Lafayette Dragon, MD;  Location: [facility];  Service: Endoscopy;  Laterality: N/A;   HEMORRHOID SURGERY     KNEE ARTHROTOMY     pt states no    LAMINECTOMY N/A 01/31/2017   Procedure: Thoracic Laminectomy and marsupialization of arachnoid cyst;  Surgeon: Jovita Gamma, MD;  Location: [facility];  Service: Neurosurgery;  Laterality: N/A;   LEFT OOPHORECTOMY  1976   NECK SURGERY N/A 03/2018   PARTIAL HYSTERECTOMY     right ovary remains   REDUCTION MAMMAPLASTY     TONSILLECTOMY  1972   UPPER GASTROINTESTINAL ENDOSCOPY     Family History  Problem Relation Age of Onset   Hypertension Mother    Diabetes Mother    Cirrhosis Father    Heart disease Father    Hypertension Sister    Diabetes Sister    Heart disease Brother        x 2   Breast cancer Cousin    Diabetes Cousin    Diabetes Other        aunt   Stroke Other        aunt   Colon cancer Neg Hx    Esophageal cancer Neg Hx    Stomach cancer Neg Hx    Rectal cancer Neg Hx    Social History   Socioeconomic History   Marital status: Widowed    Spouse name: Not on file   Number of children: 2   Years of education: Not on file   Highest education level: Not on file  Occupational History   Occupation: retired  Tobacco Use   Smoking status: Never   Smokeless tobacco: Never  Vaping Use   Vaping Use: Never used  Substance and Sexual Activity   Alcohol use: No   Drug use: No   Sexual activity: Yes    Partners: Male  Other Topics Concern   Not on file  Social History Narrative   Daily caffeine    Social Determinants of Health   Financial Resource Strain: Low Risk  (08/16/2022)   Overall Financial Resource Strain (CARDIA)    Difficulty of Paying Living Expenses: Not hard at all  Food Insecurity: No  Food Insecurity (08/16/2022)   Hunger Vital Sign    Worried About Running Out of Food in the Last Year: Never true    [place] in the Last Year: Never true  Transportation Needs: No Transportation Needs (08/16/2022)   PRAPARE - Transportation    Lack of Transportation (Medical): No    Lack of  Transportation (Non-Medical): No  Physical Activity: Sufficiently Active (08/16/2022)   Exercise Vital Sign    Days of Exercise per Week: 7 days    Minutes of Exercise per Session: 30 min  Stress: No Stress Concern Present (08/16/2022)   [place]    Feeling of Stress : Not at all  Social Connections: Moderately Isolated (08/16/2022)   Social Connection and Isolation Panel [NHANES]    Frequency of Communication with Friends and Family: More than three times a week    Frequency of Social Gatherings with Friends and Family: More than three times a week    Attends Religious Services: More than 4 times per year    Active Member of [employer] or Organizations: No    Attends Archivist Meetings: Never    Marital Status: Widowed    Tobacco Counseling Counseling given: Not Answered   Clinical Intake:  Pre-visit preparation completed: Yes  Pain : No/denies pain     BMI - recorded: 30.59 Nutritional Status: BMI > 30  Obese Nutritional Risks: None Diabetes: No  How often do you need to have someone help you when you read instructions, pamphlets, or other written materials from your doctor or pharmacy?: 1 - Never  Diabetic?no  Interpreter Needed?: No  Information entered by :: Charlott Rakes, LPN   Activities of Daily Living    08/16/2022   11:12 AM 05/29/2022    9:16 AM  In your present state of health, do you have any difficulty performing the following activities:  Hearing? 0 1  Vision? 0 0  Difficulty concentrating or making decisions? 0 0  Walking or climbing stairs? 0 0  Dressing or bathing? 0 0   Doing errands, shopping? 0 0  Preparing Food and eating ? N N  Using the Toilet? N N  In the past six months, have you accidently leaked urine? N N  Do you have problems with loss of bowel control? N N  Managing your Medications? N N  Managing your Finances? N N  Housekeeping or managing your Housekeeping? N N    Patient Care Team: Eugenia Pancoast, FNP as PCP - General (Family Medicine) Earnie Larsson, MD as Consulting Physician (Neurosurgery) Hollice Espy, MD as Consulting Physician (Urology) Buford Dresser, MD as Consulting Physician (Cardiology)  Indicate any recent Medical Services you may have received from other than [facility] providers in the past year (date may be approximate).     Assessment:   This is a routine wellness examination for Stormy.  Hearing/Vision screen Hearing Screening - Comments:: Pt denies any hearing issues  Vision Screening - Comments:: Pt follows up with [facility] dr B for annul eye exams   Dietary issues and exercise activities discussed: Current Exercise Habits: Home exercise routine, Type of exercise: walking, Time (Minutes): 30, Frequency (Times/Week): 7, Weekly Exercise (Minutes/Week): 210   Goals Addressed             This Visit's Progress    Patient Stated       Maintain health        Depression Screen    08/16/2022   11:09 AM 05/29/2022    8:44 AM 03/08/2021    9:06 AM 03/03/2020   10:02 AM 02/26/2019    9:28 AM 02/20/2018    9:27 AM 11/21/2017    8:33 AM  PHQ 2/9 Scores  PHQ - 2 Score 0 2 0 0 0 0 3  PHQ- 9 Score  4  4    Fall Risk    08/16/2022   11:11 AM 05/29/2022    8:03 AM 03/08/2021    9:05 AM 03/03/2020   10:02 AM 02/26/2019    9:28 AM  Fall Risk   Falls in the past year? 0 0 0 0 0  Number falls in past yr: 0 0 0 0   Injury with Fall? 0 0 0 0   Risk for fall due to : No Fall Risks;Impaired vision No Fall Risks     Follow up Falls prevention discussed  Falls prevention discussed Education  provided;Falls prevention discussed     FALL RISK PREVENTION PERTAINING TO THE HOME:  Any stairs in or around the home? Yes  If so, are there any without handrails? No  Home free of loose throw rugs in walkways, pet beds, electrical cords, etc? Yes  Adequate lighting in your home to reduce risk of falls? Yes   ASSISTIVE DEVICES UTILIZED TO PREVENT FALLS:  Life alert? No  Use of a cane, walker or w/c? No  Grab bars in the bathroom? No  Shower chair or bench in shower? No  Elevated toilet seat or a handicapped toilet? No   TIMED UP AND GO:  Was the test performed? No .   Cognitive Function:    02/20/2018    9:43 AM 01/01/2017   11:47 AM  MMSE - Mini Mental State Exam  Orientation to time 5 5  Orientation to Place 5 5  Registration 3 3  Attention/ Calculation 5 0  Recall 3 3  Language- name 2 objects 2 0  Language- repeat 1 1  Language- follow 3 step command 3 3  Language- read & follow direction 1 0  Write a sentence 1 0  Copy design 1 0  Total score 30 20        08/16/2022   11:12 AM  6CIT Screen  What Year? 0 points  What month? 0 points  What time? 0 points  Count back from 20 0 points  Months in reverse 0 points  Repeat phrase 0 points  Total Score 0 points    Immunizations Immunization History  Administered Date(s) Administered   Influenza,inj,Quad PF,6+ Mos 09/01/2015   PFIZER(Purple Top)SARS-COV-2 Vaccination 06/25/2020, 07/16/2020   Pneumococcal Polysaccharide-23 05/29/2022   Td 10/30/1988, 08/25/2008    TDAP status: Due, Education has been provided regarding the importance of this vaccine. Advised may receive this vaccine at local pharmacy or Health Dept. Aware to provide a copy of the vaccination record if obtained from local pharmacy or Health Dept. Verbalized acceptance and understanding.  Flu Vaccine status: Declined, Education has been provided regarding the importance of this vaccine but patient still declined. Advised may receive this  vaccine at local pharmacy or Health Dept. Aware to provide a copy of the vaccination record if obtained from local pharmacy or Health Dept. Verbalized acceptance and understanding.  Pneumococcal vaccine status: Due, Education has been provided regarding the importance of this vaccine. Advised may receive this vaccine at local pharmacy or Health Dept. Aware to provide a copy of the vaccination record if obtained from local pharmacy or Health Dept. Verbalized acceptance and understanding.  Covid-19 vaccine status: Completed vaccines  Qualifies for Shingles Vaccine? Yes   Zostavax completed No   Shingrix Completed?: No.    Education has been provided regarding the importance of this vaccine. Patient has been advised to call insurance company to determine out of pocket expense if they have not yet received  this vaccine. Advised may also receive vaccine at local pharmacy or Health Dept. Verbalized acceptance and understanding.  Screening Tests Health Maintenance  Topic Date Due   TETANUS/TDAP  08/25/2018   COVID-19 Vaccine (3 - Pfizer series) 09/10/2020   Zoster Vaccines- Shingrix (1 of 2) 08/29/2022 (Originally 02/29/2000)   INFLUENZA VACCINE  01/28/2024 (Originally 05/30/2022)   Pneumonia Vaccine 16+ Years old (2 - PCV) 05/30/2023   MAMMOGRAM  10/07/2023   COLONOSCOPY (Pts 45-40yr Insurance coverage will need to be confirmed)  06/17/2031   DEXA SCAN  Completed   Hepatitis C Screening  Completed   HPV VACCINES  Aged Out    Health Maintenance  Health Maintenance Due  Topic Date Due   TETANUS/TDAP  08/25/2018   COVID-19 Vaccine (3 - Pfizer series) 09/10/2020    Colorectal cancer screening: Type of screening: Colonoscopy. Completed 06/16/21. Repeat every 10 years  Mammogram status: Completed 10/06/21. Repeat every year  Bone Density status: Completed 10/06/21. Results reflect: Bone density results: OSTEOPENIA. Repeat every 2 years.   Additional Screening:  Hepatitis C Screening:   Completed 01/01/17  Vision Screening: Recommended annual ophthalmology exams for early detection of glaucoma and other disorders of the eye. Is the patient up to date with their annual eye exam?  Yes  Who is the provider or what is the name of the office in which the patient attends annual eye exams? [facility] If pt is not established with a provider, would they like to be referred to a provider to establish care? No .   Dental Screening: Recommended annual dental exams for proper oral hygiene  Community Resource Referral / Chronic Care Management: CRR required this visit?  No   CCM required this visit?  No      Plan:     I have personally reviewed and noted the following in the patient's chart:   Medical and social history Use of alcohol, tobacco or illicit drugs  Current medications and supplements including opioid prescriptions. Patient is not currently taking opioid prescriptions. Functional ability and status Nutritional status Physical activity Advanced directives List of other physicians Hospitalizations, surgeries, and ER visits in previous 12 months Vitals Screenings to include cognitive, depression, and falls Referrals and appointments  In addition, I have reviewed and discussed with patient certain preventive protocols, quality metrics, and best practice recommendations. A written personalized care plan for preventive services as well as general preventive health recommendations were provided to patient.     TWillette Brace LPN   [id number]  Nurse Notes: none

## 2022-08-23 ENCOUNTER — Other Ambulatory Visit: Payer: Self-pay | Admitting: Family

## 2022-08-23 DIAGNOSIS — Z1231 Encounter for screening mammogram for malignant neoplasm of breast: Secondary | ICD-10-CM

## 2022-10-17 ENCOUNTER — Ambulatory Visit
Admission: RE | Admit: 2022-10-17 | Discharge: 2022-10-17 | Disposition: A | Discharge status: Home / Self Care | Payer: Medicare PPO | Source: Ambulatory Visit | Attending: Family | Admitting: Family

## 2022-10-17 DIAGNOSIS — Z1231 Encounter for screening mammogram for malignant neoplasm of breast: Secondary | ICD-10-CM | POA: Diagnosis not present

## 2022-11-03 ENCOUNTER — Telehealth: Payer: Self-pay | Admitting: Family

## 2022-11-03 DIAGNOSIS — E039 Hypothyroidism, unspecified: Secondary | ICD-10-CM

## 2022-11-03 MED ORDER — LEVOTHYROXINE SODIUM 50 MCG PO TABS: 50.0000 ug | ORAL_TABLET | Freq: Every day | ORAL | 1 refills | Status: DC

## 2022-11-03 NOTE — Telephone Encounter (Signed)
Prescription Request  11/03/2022  Is this a "Controlled Substance" medicine? No  LOV: 05/29/2022  What is the name of the medication or equipment?  levothyroxine Expired - levothyroxine (SYNTHROID) 50 MCG tablet  Have you contacted your pharmacy to request a refill? Yes   Which pharmacy would you like this sent to?  [id number] [place] - [address] [address]Phone: [phone]: [phone]   Patient notified that their request is being sent to the clinical staff for review and that they should receive a response within 2 business days.   Please advise at Mobile [phone](mobile)

## 2022-11-03 NOTE — Telephone Encounter (Signed)
Rx sent to pharmacy   

## 2022-11-20 DIAGNOSIS — M5481 Occipital neuralgia: Secondary | ICD-10-CM | POA: Diagnosis not present

## 2022-11-20 DIAGNOSIS — Z6832 Body mass index (BMI) 32.0-32.9, adult: Secondary | ICD-10-CM | POA: Diagnosis not present

## 2022-11-29 ENCOUNTER — Encounter: Payer: Self-pay | Admitting: Family

## 2022-11-29 ENCOUNTER — Ambulatory Visit: Payer: Medicare PPO | Admitting: Family

## 2022-11-29 VITALS — BP 130/86 | HR 68 | Temp 98.4°F | Ht 64.5 in | Wt 181.2 lb

## 2022-11-29 DIAGNOSIS — H9113 Presbycusis, bilateral: Secondary | ICD-10-CM | POA: Diagnosis not present

## 2022-11-29 DIAGNOSIS — E66811 Obesity, class 1: Secondary | ICD-10-CM

## 2022-11-29 DIAGNOSIS — J0110 Acute frontal sinusitis, unspecified: Secondary | ICD-10-CM | POA: Diagnosis not present

## 2022-11-29 DIAGNOSIS — D75839 Thrombocytosis, unspecified: Secondary | ICD-10-CM

## 2022-11-29 DIAGNOSIS — R7303 Prediabetes: Secondary | ICD-10-CM | POA: Diagnosis not present

## 2022-11-29 DIAGNOSIS — E538 Deficiency of other specified B group vitamins: Secondary | ICD-10-CM

## 2022-11-29 DIAGNOSIS — E6609 Other obesity due to excess calories: Secondary | ICD-10-CM | POA: Diagnosis not present

## 2022-11-29 DIAGNOSIS — E782 Mixed hyperlipidemia: Secondary | ICD-10-CM

## 2022-11-29 DIAGNOSIS — D1771 Benign lipomatous neoplasm of kidney: Secondary | ICD-10-CM

## 2022-11-29 DIAGNOSIS — H9313 Tinnitus, bilateral: Secondary | ICD-10-CM

## 2022-11-29 DIAGNOSIS — B0222 Postherpetic trigeminal neuralgia: Secondary | ICD-10-CM | POA: Diagnosis not present

## 2022-11-29 DIAGNOSIS — E7800 Pure hypercholesterolemia, unspecified: Secondary | ICD-10-CM

## 2022-11-29 DIAGNOSIS — E039 Hypothyroidism, unspecified: Secondary | ICD-10-CM | POA: Diagnosis not present

## 2022-11-29 DIAGNOSIS — Z6830 Body mass index (BMI) 30.0-30.9, adult: Secondary | ICD-10-CM

## 2022-11-29 LAB — LIPID PANEL
Cholesterol: 215 mg/dL — ABNORMAL HIGH (ref 0–200)
HDL: 55.7 mg/dL (ref >39.00)
LDL Cholesterol: 125 mg/dL — ABNORMAL HIGH (ref 0–99)
NonHDL: 159.04
Total CHOL/HDL Ratio: 4
Triglycerides: 168 mg/dL — ABNORMAL HIGH (ref 0.0–149.0)
VLDL: 33.6 mg/dL (ref 0.0–40.0)

## 2022-11-29 LAB — CBC
HCT: 43.2 % (ref 36.0–46.0)
Hemoglobin: 15 g/dL (ref 12.0–15.0)
MCHC: 34.7 g/dL (ref 30.0–36.0)
MCV: 91.8 fl (ref 78.0–100.0)
Platelets: 401 10*3/uL — ABNORMAL HIGH (ref 150.0–400.0)
RBC: 4.71 Mil/uL (ref 3.87–5.11)
RDW: 13.1 % (ref 11.5–15.5)
WBC: 6.4 10*3/uL (ref 4.0–10.5)

## 2022-11-29 LAB — TSH: TSH: 5.03 u[IU]/mL (ref 0.35–5.50)

## 2022-11-29 LAB — HEMOGLOBIN A1C: Hgb A1c MFr Bld: 5.7 % (ref 4.6–6.5)

## 2022-11-29 LAB — VITAMIN B12: Vitamin B-12: 794 pg/mL (ref 211–911)

## 2022-11-29 MED ORDER — AMOXICILLIN-POT CLAVULANATE 875-125 MG PO TABS: 1.0000 | ORAL_TABLET | Freq: Two times a day (BID) | ORAL | 0 refills | Status: DC

## 2022-11-29 NOTE — Assessment & Plan Note (Signed)
stable °

## 2022-11-29 NOTE — Patient Instructions (Addendum)
  Stop by the lab prior to leaving today. I will notify you of your results once received.   Start prescription of augmentin 875/125 mg and take as prescribed.  Tylenol/ibuprofen ok for sinus pain as needed Increase oral fluids. Ok to continue with humidifers and hot steamy showers as discussed during visit.  It was a pleasure speaking with you today, I hope you start feeling better soon.  Regards,   Kimothy Kishimoto    Regards,   Eugenia Pancoast FNP-C

## 2022-11-29 NOTE — Progress Notes (Signed)
Established Patient Office Visit  Subjective:      CC: No chief complaint on file.   HPI: Nicole Daniels is a 72 y.o. female presenting on 11/29/2022 for No chief complaint on file. .  TD last 2009, doesn't want to get the vaccination.  Shingrix vaccination: pt declines  Influenza vaccination, pt declines.   Right kidney possible angiomyolipoma per urology. They are repeating CT end of month to monitor stability. This is with Dr. Hollice Espy.   Vitamin b12 def: taking 1000 mcg once daily.   New complaints: For the last one week with sinus pressure, worsening, and post nasal drip with slight nasal congestion. Bil ear fullness, some pain. No sore throat. No headache. No cough or chest congestion. Not improving. Over the counter taking sinus medication with only mild relief.      Social history:  Relevant past medical, surgical, family and social history reviewed and updated as indicated. Interim medical history since our last visit reviewed.  Allergies and medications reviewed and updated.  DATA REVIEWED: CHART IN EPIC     ROS: Negative unless specifically indicated above in HPI.    Current Outpatient Medications:    amoxicillin-clavulanate (AUGMENTIN) 875-125 MG tablet, Take 1 tablet by mouth 2 (two) times daily., Disp: 20 tablet, Rfl: 0   Cholecalciferol (VITAMIN D3) 2000 units TABS, Take 2,000 Units by mouth daily., Disp: , Rfl:    Cyanocobalamin (VITAMIN B 12 PO), Take by mouth., Disp: , Rfl:    ezetimibe (ZETIA) 10 MG tablet, Take 1 tablet (10 mg total) by mouth daily., Disp: 90 tablet, Rfl: 3   fluticasone (FLONASE) 50 MCG/ACT nasal spray, Place 1 spray into both nostrils as needed for allergies. , Disp: , Rfl:    levothyroxine (SYNTHROID) 50 MCG tablet, Take 1 tablet (50 mcg total) by mouth daily., Disp: 90 tablet, Rfl: 1   Methylcobalamin 1 MG CHEW, , Disp: , Rfl:       Objective:    BP 130/86   Pulse 68   Temp 98.4 F (36.9 C) (Oral)   Ht 5'  4.5" (1.638 m)   Wt 181 lb 3.2 oz (82.2 kg)   SpO2 98%   BMI 30.62 kg/m   Wt Readings from Last 3 Encounters:  11/29/22 181 lb 3.2 oz (82.2 kg)  08/16/22 181 lb (82.1 kg)  05/29/22 181 lb (82.1 kg)    Physical Exam Constitutional:      General: She is not in acute distress.    Appearance: Normal appearance. She is normal weight. She is not ill-appearing, toxic-appearing or diaphoretic.  HENT:     Head: Normocephalic.     Right Ear: Tympanic membrane is erythematous.     Left Ear: Tympanic membrane normal.     Nose:     Right Turbinates: Swollen.     Left Turbinates: Swollen.     Right Sinus: Maxillary sinus tenderness and frontal sinus tenderness present.     Left Sinus: Maxillary sinus tenderness and frontal sinus tenderness present.     Mouth/Throat:     Mouth: Mucous membranes are dry.     Pharynx: Posterior oropharyngeal erythema present. No oropharyngeal exudate.  Eyes:     Extraocular Movements: Extraocular movements intact.     Pupils: Pupils are equal, round, and reactive to light.  Cardiovascular:     Rate and Rhythm: Normal rate and regular rhythm.     Pulses: Normal pulses.     Heart sounds: Normal heart sounds.  Pulmonary:  Effort: Pulmonary effort is normal.     Breath sounds: Normal breath sounds.  Musculoskeletal:     Cervical back: Normal range of motion.  Neurological:     General: No focal deficit present.     Mental Status: She is alert and oriented to person, place, and time. Mental status is at baseline.  Psychiatric:        Mood and Affect: Mood normal.        Behavior: Behavior normal.        Thought Content: Thought content normal.        Judgment: Judgment normal.           Assessment & Plan:  Prediabetes Assessment & Plan: Pt advised of the following: Work on a diabetic diet, try to incorporate exercise at least 20-30 a day for 3 days a week or more.  Ordered A1c pending results.  Orders: -     Hemoglobin  A1c  Thrombocytosis -     CBC  Elevated LDL cholesterol level -     Lipid panel  B12 deficiency Assessment & Plan: Ordered b12 pending results Continue otc supplementation   Orders: -     Vitamin B12  Hypothyroidism, unspecified type Assessment & Plan: Continue levothyroxine  Check tsh   Orders: -     TSH  Acute non-recurrent frontal sinusitis -     Amoxicillin-Pot Clavulanate; Take 1 tablet by mouth 2 (two) times daily.  Dispense: 20 tablet; Refill: 0  Class 1 obesity due to excess calories without serious comorbidity with body mass index (BMI) of 30.0 to 30.9 in adult  Presbycusis of both ears Assessment & Plan: Stable  Pt declines audiologist referral at this time   Post-herpetic trigeminal neuralgia  Angiomyolipoma of right kidney Assessment & Plan: Continue f/u with urology    Tinnitus aurium, bilateral Assessment & Plan: stable   Mixed hyperlipidemia Assessment & Plan: Ordered lipid panel, pending results. Work on low cholesterol diet and exercise as tolerated       Return in about 6 months (around 05/30/2023) for f/u cholesterol.  Eugenia Pancoast, MSN, APRN, FNP-C [place]

## 2022-11-29 NOTE — Assessment & Plan Note (Signed)
Continue levothyroxine  Check tsh

## 2022-11-29 NOTE — Assessment & Plan Note (Signed)
Stable  Pt declines audiologist referral at this time

## 2022-11-29 NOTE — Assessment & Plan Note (Signed)
Pt advised of the following: Work on a diabetic diet, try to incorporate exercise at least 20-30 a day for 3 days a week or more.  Ordered A1c pending results. 

## 2022-11-29 NOTE — Assessment & Plan Note (Signed)
Ordered b12 pending results Continue otc supplementation

## 2022-11-29 NOTE — Addendum Note (Signed)
Addended by: Vaughan Browner on: 11/29/2022 08:43 AM   Modules accepted: Orders

## 2022-11-29 NOTE — Assessment & Plan Note (Signed)
Ordered lipid panel, pending results. Work on low cholesterol diet and exercise as tolerated ? ?

## 2022-11-29 NOTE — Assessment & Plan Note (Signed)
Continue f/u with urology

## 2022-11-30 ENCOUNTER — Encounter: Payer: Self-pay | Admitting: Family

## 2022-11-30 ENCOUNTER — Other Ambulatory Visit: Payer: Self-pay | Admitting: Family

## 2022-11-30 DIAGNOSIS — R7989 Other specified abnormal findings of blood chemistry: Secondary | ICD-10-CM

## 2022-11-30 DIAGNOSIS — E039 Hypothyroidism, unspecified: Secondary | ICD-10-CM

## 2022-11-30 MED ORDER — LEVOTHYROXINE SODIUM 75 MCG PO TABS: 75.0000 ug | ORAL_TABLET | Freq: Every day | ORAL | 1 refills | Status: DC

## 2022-12-06 ENCOUNTER — Ambulatory Visit: Payer: Medicare PPO | Admitting: Dermatology

## 2022-12-07 ENCOUNTER — Ambulatory Visit
Admission: RE | Admit: 2022-12-07 | Discharge: 2022-12-07 | Disposition: A | Discharge status: Home / Self Care | Payer: Medicare PPO | Source: Ambulatory Visit | Attending: Urology | Admitting: Urology

## 2022-12-07 DIAGNOSIS — N2889 Other specified disorders of kidney and ureter: Secondary | ICD-10-CM | POA: Diagnosis not present

## 2022-12-07 DIAGNOSIS — I700 Atherosclerosis of aorta: Secondary | ICD-10-CM | POA: Diagnosis not present

## 2022-12-07 DIAGNOSIS — N281 Cyst of kidney, acquired: Secondary | ICD-10-CM | POA: Diagnosis not present

## 2022-12-07 DIAGNOSIS — Z9049 Acquired absence of other specified parts of digestive tract: Secondary | ICD-10-CM | POA: Diagnosis not present

## 2022-12-07 DIAGNOSIS — D1771 Benign lipomatous neoplasm of kidney: Secondary | ICD-10-CM | POA: Diagnosis not present

## 2022-12-07 MED ORDER — IOHEXOL 300 MG/ML  SOLN
100.0000 mL | Freq: Once | INTRAMUSCULAR | Status: AC | PRN
  Administered 2022-12-07: 100 mL via INTRAVENOUS

## 2022-12-13 ENCOUNTER — Other Ambulatory Visit: Payer: Self-pay | Admitting: Family

## 2022-12-13 DIAGNOSIS — E039 Hypothyroidism, unspecified: Secondary | ICD-10-CM

## 2022-12-13 LAB — POCT I-STAT CREATININE: Creatinine, Ser: 0.9 mg/dL (ref 0.44–1.00)

## 2022-12-19 ENCOUNTER — Encounter: Payer: Self-pay | Admitting: Urology

## 2022-12-19 ENCOUNTER — Ambulatory Visit: Payer: Medicare PPO | Admitting: Urology

## 2022-12-19 VITALS — BP 142/80 | HR 88 | Ht 65.0 in | Wt 180.0 lb

## 2022-12-19 DIAGNOSIS — R3129 Other microscopic hematuria: Secondary | ICD-10-CM

## 2022-12-19 DIAGNOSIS — N2889 Other specified disorders of kidney and ureter: Secondary | ICD-10-CM

## 2022-12-19 DIAGNOSIS — N898 Other specified noninflammatory disorders of vagina: Secondary | ICD-10-CM

## 2022-12-19 MED ORDER — FLUCONAZOLE 150 MG PO TABS: 150.0000 mg | ORAL_TABLET | Freq: Once | ORAL | 1 refills | Status: AC

## 2022-12-19 NOTE — Progress Notes (Signed)
I, DeAsia L Maxie,acting as a scribe for Hollice Espy, MD.,have documented all relevant documentation on the behalf of Hollice Espy, MD,as directed by  Hollice Espy, MD while in the presence of Hollice Espy, MD.   I, Ferguson as a scribe for Hollice Espy, MD.,have documented all relevant documentation on the behalf of Hollice Espy, MD,as directed by  Hollice Espy, MD while in the presence of Hollice Espy, MD.   12/19/22 12:57 PM   Nicole Daniels 1951-01-04 [id number]  Referring provider: Eugenia Pancoast, [place],  [place] [address]  Chief Complaint  Patient presents with   kidney lesion follow up   Hematuria    HPI: 72 year-old female who presents today for a 9 month follow-up with CT of the abdomen.   She was initially referred for concern for left kidney nodule as well as right AMLs. She has undergone repeat imaging for further characterization of these in the form of CT abdomen with or without contrast completed on 12/07/22. It's side by side comparison of her previous CT abdomen from a year ago. Lesions in question are consistent with Bosniak two cysts, as well as stable small benign AML in the right upper cortex. She also has multiple sub-centimeter lesions in both kidneys up to 7 mm, which are not able to be completely characterized.   She had a microscopic hematuria workup last year, including cystoscopy which was unremarkable.   She reports burning and itching, suggestive of a yeast infection following antibiotic use for allergies and sinus problems. She has been using Premarin cream which has caused some relief with her symptoms.  PMH: Past Medical History:  Diagnosis Date   Abdominal pain, right lower quadrant    Acquired cyst of kidney 01/08/2012   pt denies history of    Acute sinusitis, unspecified    Adenomatous colon polyp    Allergy    Angiomyolipoma    kidney   Anxiety    OCCASIONAL   Chest pain     Chest pain, unspecified    Depressive disorder, not elsewhere classified 01/08/2012   not currently    Diverticulosis    Dizziness and giddiness    Dysfunction of eustachian tube    H/O Spinal surgery 02/20/2017   Hemorrhoids    History of diverticulitis of colon    History of fusion of cervical spine 04/25/2018   IBS (irritable bowel syndrome)    Liver lesion 2011   Neoplasm of skin of face    malignant   OA (osteoarthritis)    knee, NECK   Obesity, unspecified    Ostium secundum type atrial septal defect    Other and unspecified hyperlipidemia    Other malaise and fatigue    Palpitations    Shortness of breath    Thyroid disease    Unspecified essential hypertension    pt states she does not have HTN   Unspecified sinusitis (chronic)    Unspecified vitamin D deficiency     Surgical History: Past Surgical History:  Procedure Laterality Date   ABDOMINAL HYSTERECTOMY     BREAST REDUCTION SURGERY  1990   CERVICAL [place]   CHOLECYSTECTOMY     COLONOSCOPY     DILATION AND CURETTAGE OF UTERUS     EYE SURGERY Bilateral 07/30/2017   cataract sx   FLEXIBLE SIGMOIDOSCOPY  01/08/2012   Procedure: FLEXIBLE SIGMOIDOSCOPY;  Surgeon: Lafayette Dragon, MD;  Location: [facility];  Service: Endoscopy;  Laterality: N/A;   HEMORRHOID SURGERY     KNEE ARTHROTOMY     pt states no    LAMINECTOMY N/A 01/31/2017   Procedure: Thoracic Laminectomy and marsupialization of arachnoid cyst;  Surgeon: Jovita Gamma, MD;  Location: [facility];  Service: Neurosurgery;  Laterality: N/A;   LEFT OOPHORECTOMY  1976   NECK SURGERY N/A 03/2018   PARTIAL HYSTERECTOMY     right ovary remains   REDUCTION MAMMAPLASTY     TONSILLECTOMY  1972   UPPER GASTROINTESTINAL ENDOSCOPY      Home Medications:  Allergies as of 12/19/2022       Reactions   Simvastatin Other (See Comments)   MYALGIAS   Statins Other (See Comments)   Has severe leg muscle aches and cramps   Bactrim  [sulfamethoxazole-trimethoprim] Itching   Sertraline Anxiety        Medication List        Accurate as of December 19, 2022 12:57 PM. If you have any questions, ask your nurse or doctor.          amoxicillin-clavulanate 875-125 MG tablet Commonly known as: AUGMENTIN Take 1 tablet by mouth 2 (two) times daily.   ezetimibe 10 MG tablet Commonly known as: Zetia Take 1 tablet (10 mg total) by mouth daily.   fluconazole 150 MG tablet Commonly known as: DIFLUCAN Take 1 tablet (150 mg total) by mouth once for 1 dose. Started by: Hollice Espy, MD   fluticasone 50 MCG/ACT nasal spray Commonly known as: FLONASE Place 1 spray into both nostrils as needed for allergies.   levothyroxine 75 MCG tablet Commonly known as: SYNTHROID TAKE 1 TABLET BY MOUTH EVERY DAY   Methylcobalamin 1 MG Chew   VITAMIN B 12 PO Take by mouth.   Vitamin D3 50 MCG (2000 UT) Tabs Generic drug: Cholecalciferol Take 2,000 Units by mouth daily.        Allergies:  Allergies  Allergen Reactions   Simvastatin Other (See Comments)    MYALGIAS   Statins Other (See Comments)    Has severe leg muscle aches and cramps   Bactrim [Sulfamethoxazole-Trimethoprim] Itching   Sertraline Anxiety    Family History: Family History  Problem Relation Age of Onset   Hypertension Mother    Diabetes Mother    Cirrhosis Father    Heart disease Father    Hypertension Sister    Diabetes Sister    Heart disease Brother        x 2   Breast cancer Cousin    Diabetes Cousin    Diabetes Other        aunt   Stroke Other        aunt   Colon cancer Neg Hx    Esophageal cancer Neg Hx    Stomach cancer Neg Hx    Rectal cancer Neg Hx     Social History:  reports that she has never smoked. She has never used smokeless tobacco. She reports that she does not drink alcohol and does not use drugs.   Physical Exam: BP (!) 142/80   Pulse 88   Ht 5' 5"$  (1.651 m)   Wt 180 lb (81.6 kg)   BMI 29.95 kg/m    Constitutional:  Alert and oriented, No acute distress. HEENT: [place] AT, moist mucus membranes.  Trachea midline, no masses. Neurologic: Grossly intact, no focal deficits, moving all 4 extremities. Psychiatric: Normal mood and affect.  Pertinent Imaging: EXAM: CT ABDOMEN WITHOUT AND WITH  CONTRAST   TECHNIQUE: Multidetector CT imaging of the abdomen was performed following the standard protocol before and following the bolus administration of intravenous contrast.   RADIATION DOSE REDUCTION: This exam was performed according to the departmental dose-optimization program which includes automated exposure control, adjustment of the mA and/or kV according to patient size and/or use of iterative reconstruction technique.   CONTRAST:  137m OMNIPAQUE IOHEXOL 300 MG/ML  SOLN   COMPARISON:  12/20/2021   FINDINGS: Lower chest: No acute abnormality.   Hepatobiliary: No focal liver abnormality is seen. Status post cholecystectomy. No biliary dilatation.   Pancreas: Unremarkable. No pancreatic ductal dilatation or surrounding inflammatory changes.   Spleen: Normal in size without focal abnormality.   Adrenals/Urinary Tract: Normal adrenal glands.   No nephrolithiasis or hydronephrosis.   -Exophytic lesion arising off the lateral cortex of the right kidney measures 2 cm. This measures 40 Hounsfield units on the precontrast images and shows no internal enhancement on the postcontrast images compatible with a benign Bosniak class 2 lesion.   -Arising off the posterior cortex of the upper pole of left kidney is a exophytic lesion which measures 1 cm, image 40/9. On the precontrast images this measures 51 Hounsfield units without significant enhancement on the postcontrast images also compatible with a benign Bosniak class 2 lesion.   -Small angiomyolipoma is identified within the upper pole cortex of the right kidney measuring 7 mm, image 17/4.   -Several too small to characterize  low-attenuation kidney lesions are noted within both kidneys. These measure up to 7 mm.   Stomach/Bowel: Stomach is within normal limits. No evidence of bowel wall thickening, distention, or inflammatory changes.   Vascular/Lymphatic: Aortic atherosclerosis. No abdominal adenopathy identified.   Other: No free fluid or fluid collections.   Musculoskeletal: No acute or significant osseous findings.   IMPRESSION: 1. No acute findings within the abdomen. 2. Bilateral Bosniak class 2 cysts. No follow-up imaging recommended. 3. Small, benign angiomyolipoma within the upper pole cortex of the right kidney. 4. Small too small to characterize low-attenuation kidney lesions are noted within both kidneys. These measure up to 7 mm. 5.  Aortic Atherosclerosis (ICD10-I70.0).     Electronically Signed   By: TKerby MoorsM.D.   On: 12/07/2022 12:08  This was personally reviewed and agree with the radiologic interpretation.   Assessment & Plan:    1.Renal cysts/lesions - All lesions are fairly stable and benign, Bosniak two with no malignant potential.  - She also has an incidental stable sub-centimeter right AML. Multiple smaller lesions are not able to be completely characterized 7 mm, although likely benign.  - We plan for a follow-up in a year with a renal ultrasound to deescalate imaging modality and decreasing intervals thereafter. Mostly due to concern for the multiple sub-centimeter lesions. We will do renal ultrasound next year at her follow-up visit.   2. History of microscopic hematuria - Unremarkable workup last year.  - Plan to recheck UA at her next year follow-up visit  3. Vaginal itching - Likely secondary to antibiotic use. Plan to treat with Diflucan 150 mg orally, one dose with one refill as needed. If symptoms persist, she's advised to return for urinalysis and possibly a pelvic exam.  - Premarin cream can be used for relief but likely will not treat yeast infection.    Return in about 1 year (around 12/20/2023) for renal ultrasound and UA.  I have reviewed the above documentation for accuracy and completeness, and I agree with  the above.   Hollice Espy, MD    [facility] [address] [place], [place] [address] [phone]

## 2023-01-05 ENCOUNTER — Ambulatory Visit: Payer: Medicare PPO | Admitting: Nurse Practitioner

## 2023-01-05 ENCOUNTER — Encounter: Payer: Self-pay | Admitting: Nurse Practitioner

## 2023-01-05 VITALS — BP 126/80 | HR 80 | Temp 98.5°F | Ht 65.0 in | Wt 179.2 lb

## 2023-01-05 DIAGNOSIS — J0140 Acute pansinusitis, unspecified: Secondary | ICD-10-CM

## 2023-01-05 DIAGNOSIS — R7989 Other specified abnormal findings of blood chemistry: Secondary | ICD-10-CM | POA: Diagnosis not present

## 2023-01-05 DIAGNOSIS — R5383 Other fatigue: Secondary | ICD-10-CM | POA: Diagnosis not present

## 2023-01-05 LAB — T4, FREE: Free T4: 0.88 ng/dL (ref 0.60–1.60)

## 2023-01-05 LAB — CBC
HCT: 44.6 % (ref 36.0–46.0)
Hemoglobin: 14.8 g/dL (ref 12.0–15.0)
MCHC: 33.1 g/dL (ref 30.0–36.0)
MCV: 92.1 fl (ref 78.0–100.0)
Platelets: 376 10*3/uL (ref 150.0–400.0)
RBC: 4.85 Mil/uL (ref 3.87–5.11)
RDW: 13.2 % (ref 11.5–15.5)
WBC: 11.6 10*3/uL — ABNORMAL HIGH (ref 4.0–10.5)

## 2023-01-05 LAB — TSH: TSH: 0.71 u[IU]/mL (ref 0.35–5.50)

## 2023-01-05 MED ORDER — DOXYCYCLINE HYCLATE 100 MG PO TABS: 100.0000 mg | ORAL_TABLET | Freq: Two times a day (BID) | ORAL | 0 refills | Status: AC

## 2023-01-05 MED ORDER — PREDNISONE 20 MG PO TABS: 20.0000 mg | ORAL_TABLET | Freq: Every day | ORAL | 0 refills | Status: AC

## 2023-01-05 NOTE — Assessment & Plan Note (Signed)
Will check CBC, TSH and free T4

## 2023-01-05 NOTE — Progress Notes (Signed)
Thyroid labs are ok. Slight increase in WBC is consistent with the infection and inflammation going on at present.

## 2023-01-05 NOTE — Progress Notes (Signed)
Established Patient Office Visit  Subjective:  Patient ID: Nicole Daniels, female    DOB: [birth year]  Age: 72 y.o. MRN: [id number]  CC:  Chief Complaint  Patient presents with   Sore Throat    Sore throat, ear pain, sinus pressure, sore neck glands, lightheaded some, some chest tightness and pain in between shoulder blades    HPI  Nicole Daniels presents for sinus pressure, sore throat, ear fullness and headache  from 1 week. She report pain in the shoulder blades from 1 week.  She tried allergy and sinus medication OTC with out significant improvement.  She was treated with Augmentin more than a month ago for sinusitis.  She also reports of feeling fatigued since last 1 month after the increase in the thyroid medication from 50 mcg to 75 mcg.    Past Medical History:  Diagnosis Date   Abdominal pain, right lower quadrant    Acquired cyst of kidney 01/08/2012   pt denies history of    Acute sinusitis, unspecified    Adenomatous colon polyp    Allergy    Angiomyolipoma    kidney   Anxiety    OCCASIONAL   Chest pain    Chest pain, unspecified    Depressive disorder, not elsewhere classified 01/08/2012   not currently    Diverticulosis    Dizziness and giddiness    Dysfunction of eustachian tube    H/O Spinal surgery 02/20/2017   Hemorrhoids    History of diverticulitis of colon    History of fusion of cervical spine 04/25/2018   IBS (irritable bowel syndrome)    Liver lesion 2011   Neoplasm of skin of face    malignant   OA (osteoarthritis)    knee, NECK   Obesity, unspecified    Ostium secundum type atrial septal defect    Other and unspecified hyperlipidemia    Other malaise and fatigue    Palpitations    Shortness of breath    Thyroid disease    Unspecified essential hypertension    pt states she does not have HTN   Unspecified sinusitis (chronic)    Unspecified vitamin D deficiency     Past Surgical History:  Procedure Laterality Date    ABDOMINAL HYSTERECTOMY     BREAST REDUCTION SURGERY  1990   CERVICAL [place]   CHOLECYSTECTOMY     COLONOSCOPY     DILATION AND CURETTAGE OF UTERUS     EYE SURGERY Bilateral 07/30/2017   cataract sx   FLEXIBLE SIGMOIDOSCOPY  01/08/2012   Procedure: FLEXIBLE SIGMOIDOSCOPY;  Surgeon: Lafayette Dragon, MD;  Location: [facility];  Service: Endoscopy;  Laterality: N/A;   HEMORRHOID SURGERY     KNEE ARTHROTOMY     pt states no    LAMINECTOMY N/A 01/31/2017   Procedure: Thoracic Laminectomy and marsupialization of arachnoid cyst;  Surgeon: Jovita Gamma, MD;  Location: [place];  Service: Neurosurgery;  Laterality: N/A;   LEFT OOPHORECTOMY  1976   NECK SURGERY N/A 03/2018   PARTIAL HYSTERECTOMY     right ovary remains   REDUCTION MAMMAPLASTY     TONSILLECTOMY  1972   UPPER GASTROINTESTINAL ENDOSCOPY      Family History  Problem Relation Age of Onset   Hypertension Mother    Diabetes Mother    Cirrhosis Father    Heart disease Father    Hypertension Sister    Diabetes Sister    Heart disease Brother  x 2   Breast cancer Cousin    Diabetes Cousin    Diabetes Other        aunt   Stroke Other        aunt   Colon cancer Neg Hx    Esophageal cancer Neg Hx    Stomach cancer Neg Hx    Rectal cancer Neg Hx     Social History   Socioeconomic History   Marital status: Widowed    Spouse name: Not on file   Number of children: 2   Years of education: Not on file   Highest education level: Not on file  Occupational History   Occupation: retired  Tobacco Use   Smoking status: Never   Smokeless tobacco: Never  Vaping Use   Vaping Use: Never used  Substance and Sexual Activity   Alcohol use: No   Drug use: No   Sexual activity: Yes    Partners: Male  Other Topics Concern   Not on file  Social History Narrative   Daily caffeine    Social Determinants of Health   Financial Resource Strain: Low Risk  (08/16/2022)   Overall Financial Resource  Strain (CARDIA)    Difficulty of Paying Living Expenses: Not hard at all  Food Insecurity: No Food Insecurity (08/16/2022)   Hunger Vital Sign    Worried About Running Out of Food in the Last Year: Never true    [place] in the Last Year: Never true  Transportation Needs: No Transportation Needs (08/16/2022)   PRAPARE - Hydrologist (Medical): No    Lack of Transportation (Non-Medical): No  Physical Activity: Sufficiently Active (08/16/2022)   Exercise Vital Sign    Days of Exercise per Week: 7 days    Minutes of Exercise per Session: 30 min  Stress: No Stress Concern Present (08/16/2022)   [place]    Feeling of Stress : Not at all  Social Connections: Moderately Isolated (08/16/2022)   Social Connection and Isolation Panel [NHANES]    Frequency of Communication with Friends and Family: More than three times a week    Frequency of Social Gatherings with Friends and Family: More than three times a week    Attends Religious Services: More than 4 times per year    Active Member of [employer] or Organizations: No    Attends Archivist Meetings: Never    Marital Status: Widowed  Intimate Partner Violence: Not At Risk (08/16/2022)   Humiliation, Afraid, Rape, and Kick questionnaire    Fear of Current or Ex-Partner: No    Emotionally Abused: No    Physically Abused: No    Sexually Abused: No     Outpatient Medications Prior to Visit  Medication Sig Dispense Refill   Cholecalciferol (VITAMIN D3) 2000 units TABS Take 2,000 Units by mouth daily.     Cyanocobalamin (VITAMIN B 12 PO) Take by mouth.     ezetimibe (ZETIA) 10 MG tablet Take 1 tablet (10 mg total) by mouth daily. 90 tablet 3   fluticasone (FLONASE) 50 MCG/ACT nasal spray Place 1 spray into both nostrils as needed for allergies.      levothyroxine (SYNTHROID) 75 MCG tablet TAKE 1 TABLET BY MOUTH EVERY DAY 90 tablet 1    Methylcobalamin 1 MG CHEW      amoxicillin-clavulanate (AUGMENTIN) 875-125 MG tablet Take 1 tablet by mouth 2 (two) times daily. (Patient not taking: Reported on  12/19/2022) 20 tablet 0   No facility-administered medications prior to visit.    Allergies  Allergen Reactions   Simvastatin Other (See Comments)    MYALGIAS   Statins Other (See Comments)    Has severe leg muscle aches and cramps   Bactrim [Sulfamethoxazole-Trimethoprim] Itching   Sertraline Anxiety    ROS Review of Systems  Constitutional:  Positive for fatigue.  HENT:  Positive for sinus pressure and sore throat.   Eyes: Negative.   Respiratory:  Positive for chest tightness.   Cardiovascular: Negative.   Musculoskeletal:        Shoulder blade pain  Neurological: Negative.   Psychiatric/Behavioral: Negative.        Objective:    Physical Exam Constitutional:      Appearance: She is well-developed.  HENT:     Right Ear: Tympanic membrane normal.     Left Ear: Tympanic membrane normal.     Nose:     Right Sinus: Maxillary sinus tenderness and frontal sinus tenderness present.     Left Sinus: Maxillary sinus tenderness and frontal sinus tenderness present.     Mouth/Throat:     Mouth: Mucous membranes are moist.     Pharynx: Posterior oropharyngeal erythema present. No pharyngeal swelling or oropharyngeal exudate.     Tonsils: No tonsillar exudate or tonsillar abscesses.  Cardiovascular:     Rate and Rhythm: Normal rate and regular rhythm.     Pulses: Normal pulses.     Heart sounds: Normal heart sounds.  Pulmonary:     Effort: Pulmonary effort is normal.     Breath sounds: Normal breath sounds. No stridor. No wheezing.  Musculoskeletal:     Comments: Tenderness around scapula  Neurological:     General: No focal deficit present.     Mental Status: She is alert. Mental status is at baseline.  Psychiatric:        Mood and Affect: Mood normal.        Behavior: Behavior normal.        Thought Content:  Thought content normal.        Judgment: Judgment normal.     BP 126/80   Pulse 80   Temp 98.5 F (36.9 C)   Ht '5\' 5"'$  (1.651 m)   Wt 179 lb 3.2 oz (81.3 kg)   SpO2 96%   BMI 29.82 kg/m  Wt Readings from Last 3 Encounters:  01/05/23 179 lb 3.2 oz (81.3 kg)  12/19/22 180 lb (81.6 kg)  11/29/22 181 lb 3.2 oz (82.2 kg)     Health Maintenance  Topic Date Due   COVID-19 Vaccine (3 - 2023-24 season) 01/20/2023 (Originally 06/30/2022)   Zoster Vaccines- Shingrix (1 of 2) 02/27/2023 (Originally 02/29/2000)   INFLUENZA VACCINE  01/28/2024 (Originally 05/30/2022)   Pneumonia Vaccine 20+ Years old (2 of 2 - PCV) 05/30/2023   Medicare Annual Wellness (AWV)  08/17/2023   MAMMOGRAM  10/17/2024   COLONOSCOPY (Pts 45-58yr Insurance coverage will need to be confirmed)  06/17/2031   DEXA SCAN  Completed   Hepatitis C Screening  Completed   HPV VACCINES  Aged Out   DTaP/Tdap/Td  Discontinued    There are no preventive care reminders to display for this patient.  Lab Results  Component Value Date   TSH 0.71 01/05/2023   Lab Results  Component Value Date   WBC 11.6 (H) 01/05/2023   HGB 14.8 01/05/2023   HCT 44.6 01/05/2023   MCV 92.1 01/05/2023   PLT 376.0  01/05/2023   Lab Results  Component Value Date   NA 141 05/29/2022   K 3.9 05/29/2022   CO2 28 05/29/2022   GLUCOSE 94 05/29/2022   BUN 11 05/29/2022   CREATININE 0.90 12/07/2022   BILITOT 0.6 05/29/2022   ALKPHOS 84 05/29/2022   AST 16 05/29/2022   ALT 13 05/29/2022   PROT 7.3 05/29/2022   ALBUMIN 4.2 05/29/2022   CALCIUM 9.8 05/29/2022   ANIONGAP 11 02/22/2022   GFR 67.57 05/29/2022   Lab Results  Component Value Date   CHOL 215 (H) 11/29/2022   Lab Results  Component Value Date   HDL 55.70 11/29/2022   Lab Results  Component Value Date   LDLCALC 125 (H) 11/29/2022   Lab Results  Component Value Date   TRIG 168.0 (H) 11/29/2022   Lab Results  Component Value Date   CHOLHDL 4 11/29/2022   Lab Results   Component Value Date   HGBA1C 5.7 11/29/2022      Assessment & Plan:  Acute pansinusitis, recurrence not specified Assessment & Plan: Started on doxycycline twice a day for 10 days and prednisone. Advised her to increase hydration   Other fatigue Assessment & Plan: Will check CBC, TSH and free T4  Orders: -     T4, free -     CBC -     TSH  Elevated platelet count -     CBC  Other orders -     Doxycycline Hyclate; Take 1 tablet (100 mg total) by mouth 2 (two) times daily for 10 days.  Dispense: 20 tablet; Refill: 0 -     predniSONE; Take 1 tablet (20 mg total) by mouth daily with breakfast for 5 days.  Dispense: 5 tablet; Refill: 0    Follow-up: Return if symptoms worsen or fail to improve.   Theresia Lo, NP

## 2023-01-05 NOTE — Assessment & Plan Note (Signed)
Started on doxycycline twice a day for 10 days and prednisone. Advised her to increase hydration

## 2023-01-05 NOTE — Patient Instructions (Signed)
Started on doxycycline and prednisone. Will do labs today. Increase hydration and use humidifier. If symptoms do not improve please call the office for further evaluation.

## 2023-01-22 ENCOUNTER — Telehealth: Payer: Self-pay | Admitting: Family

## 2023-01-22 NOTE — Telephone Encounter (Signed)
Spoke with the patient and scheduled a f/u visit for re-evaluation.

## 2023-01-22 NOTE — Telephone Encounter (Signed)
Patient was treated for a sinus infection,she finished a week ago.She called in today stating that she is still having a sore throat,ear pain,sinus pressure,and headache,with a low grade fever.She would like to know if theres any other medications that can be sent in or should she come back in to get reevaluated?

## 2023-01-24 ENCOUNTER — Ambulatory Visit: Payer: Medicare PPO | Admitting: Family

## 2023-01-24 ENCOUNTER — Encounter: Payer: Self-pay | Admitting: Family

## 2023-01-24 VITALS — BP 134/84 | HR 81 | Temp 98.0°F | Ht 65.0 in | Wt 184.0 lb

## 2023-01-24 DIAGNOSIS — J029 Acute pharyngitis, unspecified: Secondary | ICD-10-CM | POA: Diagnosis not present

## 2023-01-24 DIAGNOSIS — B9689 Other specified bacterial agents as the cause of diseases classified elsewhere: Secondary | ICD-10-CM

## 2023-01-24 DIAGNOSIS — D72829 Elevated white blood cell count, unspecified: Secondary | ICD-10-CM | POA: Diagnosis not present

## 2023-01-24 DIAGNOSIS — E876 Hypokalemia: Secondary | ICD-10-CM | POA: Insufficient documentation

## 2023-01-24 DIAGNOSIS — J020 Streptococcal pharyngitis: Secondary | ICD-10-CM

## 2023-01-24 DIAGNOSIS — H109 Unspecified conjunctivitis: Secondary | ICD-10-CM

## 2023-01-24 LAB — CBC WITH DIFFERENTIAL/PLATELET
Basophils Absolute: 0 10*3/uL (ref 0.0–0.1)
Basophils Relative: 0.3 % (ref 0.0–3.0)
Eosinophils Absolute: 0.2 10*3/uL (ref 0.0–0.7)
Eosinophils Relative: 2.2 % (ref 0.0–5.0)
HCT: 42.4 % (ref 36.0–46.0)
Hemoglobin: 14.2 g/dL (ref 12.0–15.0)
Lymphocytes Relative: 18.9 % (ref 12.0–46.0)
Lymphs Abs: 1.7 10*3/uL (ref 0.7–4.0)
MCHC: 33.6 g/dL (ref 30.0–36.0)
MCV: 91.6 fl (ref 78.0–100.0)
Monocytes Absolute: 0.8 10*3/uL (ref 0.1–1.0)
Monocytes Relative: 8.3 % (ref 3.0–12.0)
Neutro Abs: 6.5 10*3/uL (ref 1.4–7.7)
Neutrophils Relative %: 70.3 % (ref 43.0–77.0)
Platelets: 378 10*3/uL (ref 150.0–400.0)
RBC: 4.63 Mil/uL (ref 3.87–5.11)
RDW: 13.3 % (ref 11.5–15.5)
WBC: 9.2 10*3/uL (ref 4.0–10.5)

## 2023-01-24 LAB — BASIC METABOLIC PANEL
BUN: 13 mg/dL (ref 6–23)
CO2: 29 mEq/L (ref 19–32)
Calcium: 9.7 mg/dL (ref 8.4–10.5)
Chloride: 102 mEq/L (ref 96–112)
Creatinine, Ser: 0.81 mg/dL (ref 0.40–1.20)
GFR: 72.28 mL/min (ref >60.00)
Glucose, Bld: 94 mg/dL (ref 70–99)
Potassium: 3.4 mEq/L — ABNORMAL LOW (ref 3.5–5.1)
Sodium: 140 mEq/L (ref 135–145)

## 2023-01-24 LAB — POCT RAPID STREP A (OFFICE): Rapid Strep A Screen: POSITIVE — AB

## 2023-01-24 LAB — POC COVID19 BINAXNOW: SARS Coronavirus 2 Ag: NEGATIVE

## 2023-01-24 LAB — POCT INFLUENZA A/B
Influenza A, POC: NEGATIVE
Influenza B, POC: NEGATIVE

## 2023-01-24 MED ORDER — AMOXICILLIN 875 MG PO TABS: 875.0000 mg | ORAL_TABLET | Freq: Two times a day (BID) | ORAL | 0 refills | Status: AC

## 2023-01-24 MED ORDER — OFLOXACIN 0.3 % OP SOLN: 1.0000 [drp] | Freq: Four times a day (QID) | OPHTHALMIC | 0 refills | Status: AC

## 2023-01-24 NOTE — Assessment & Plan Note (Signed)
Strep tested positive in office.  rx for amox 875 mg po bid x 10 days.  Ibuprofen/tyelnol prn sore throat/fever Pt told to F/u if no improvement in the next 2-3 days.

## 2023-01-24 NOTE — Progress Notes (Signed)
Established Patient Office Visit  Subjective:   Patient ID: Nicole Daniels, female    DOB: Oct 15, 1951  Age: 72 y.o. MRN: [id number]  CC:  Chief Complaint  Patient presents with   Sore Throat    HPI: Nicole Daniels is a 72 y.o. female presenting on 01/24/2023 for Sore Throat   Sore Throat     Completed doxycycline 100 mg po bid x 10 days and completed it about four days ago, felt better slightly at the time, and also took prednisone x 5 days but then again symptoms started again 4-5 days ago.   Today with sore throat, white eye discharge bil, nausea, headache and not feeling well. Decreased energy as well with body aches.        ROS: Negative unless specifically indicated above in HPI.   Relevant past medical history reviewed and updated as indicated.   Allergies and medications reviewed and updated.   Current Outpatient Medications:    amoxicillin (AMOXIL) 875 MG tablet, Take 1 tablet (875 mg total) by mouth 2 (two) times daily for 10 days., Disp: 20 tablet, Rfl: 0   ofloxacin (OCUFLOX) 0.3 % ophthalmic solution, Place 1 drop into both eyes 4 (four) times daily for 7 days., Disp: 1.4 mL, Rfl: 0   Cholecalciferol (VITAMIN D3) 2000 units TABS, Take 2,000 Units by mouth daily., Disp: , Rfl:    Cyanocobalamin (VITAMIN B 12 PO), Take by mouth., Disp: , Rfl:    ezetimibe (ZETIA) 10 MG tablet, Take 1 tablet (10 mg total) by mouth daily., Disp: 90 tablet, Rfl: 3   fluticasone (FLONASE) 50 MCG/ACT nasal spray, Place 1 spray into both nostrils as needed for allergies. , Disp: , Rfl:    levothyroxine (SYNTHROID) 75 MCG tablet, TAKE 1 TABLET BY MOUTH EVERY DAY, Disp: 90 tablet, Rfl: 1   Methylcobalamin 1 MG CHEW, , Disp: , Rfl:   Allergies  Allergen Reactions   Simvastatin Other (See Comments)    MYALGIAS   Statins Other (See Comments)    Has severe leg muscle aches and cramps   Bactrim [Sulfamethoxazole-Trimethoprim] Itching   Sertraline Anxiety    Objective:    BP 134/84   Pulse 81   Temp 98 F (36.7 C) (Temporal)   Ht 5\' 5"  (1.651 m)   Wt 184 lb (83.5 kg)   SpO2 98%   BMI 30.62 kg/m    Physical Exam Constitutional:      General: She is not in acute distress.    Appearance: Normal appearance. She is normal weight. She is not ill-appearing, toxic-appearing or diaphoretic.  HENT:     Head: Normocephalic.     Right Ear: A middle ear effusion is present. Tympanic membrane is erythematous.     Left Ear: Tympanic membrane normal.     Nose: Nose normal.     Mouth/Throat:     Mouth: Mucous membranes are dry.     Pharynx: Posterior oropharyngeal erythema present. No oropharyngeal exudate.     Tonsils: No tonsillar exudate. 1+ on the right. 1+ on the left.  Eyes:     Extraocular Movements: Extraocular movements intact.     Pupils: Pupils are equal, round, and reactive to light.  Cardiovascular:     Rate and Rhythm: Normal rate and regular rhythm.     Pulses: Normal pulses.     Heart sounds: Normal heart sounds.  Pulmonary:     Effort: Pulmonary effort is normal.     Breath sounds: Normal breath sounds.  Musculoskeletal:     Cervical back: Normal range of motion.  Lymphadenopathy:     Cervical: Cervical adenopathy present.     Right cervical: Superficial cervical adenopathy present.     Left cervical: Superficial cervical adenopathy present.  Neurological:     General: No focal deficit present.     Mental Status: She is alert and oriented to person, place, and time. Mental status is at baseline.  Psychiatric:        Mood and Affect: Mood normal.        Behavior: Behavior normal.        Thought Content: Thought content normal.        Judgment: Judgment normal.     Assessment & Plan:  Sore throat -     POC COVID-19 BinaxNow -     POCT Influenza A/B -     POCT rapid strep A  Strep pharyngitis Assessment & Plan: Strep tested positive in office.  rx for amox 875 mg po bid x 10 days.  Ibuprofen/tyelnol prn sore  throat/fever Pt told to F/u if no improvement in the next 2-3 days.   Orders: -     Amoxicillin; Take 1 tablet (875 mg total) by mouth 2 (two) times daily for 10 days.  Dispense: 20 tablet; Refill: 0  Bacterial conjunctivitis of both eyes -     Ofloxacin; Place 1 drop into both eyes 4 (four) times daily for 7 days.  Dispense: 1.4 mL; Refill: 0  Hypokalemia -     Basic metabolic panel  Leukocytosis, unspecified type -     CBC with Differential/Platelet     Follow up plan: Return if symptoms worsen or fail to improve.  Eugenia Pancoast, FNP

## 2023-01-24 NOTE — Patient Instructions (Signed)
------------------------------------    You were found to be strep positive,  Take antibiotics that have been sent to the pharmacy.  Change your toothbrush after 24 hours on the antibiotics.  Gargle with warm salt water as needed for sore throat.   ------------------------------------  

## 2023-02-12 ENCOUNTER — Telehealth: Payer: Self-pay | Admitting: Family

## 2023-02-12 NOTE — Telephone Encounter (Signed)
Patient scheduled.

## 2023-02-12 NOTE — Telephone Encounter (Signed)
Patient contacted the office stating she is not feeling better since her last visit, there is a sameday slot open tomorrow at 9 am and I wanted to ask if it was okay to put this patient there. Please advise, thank you.

## 2023-02-12 NOTE — Telephone Encounter (Signed)
Please call patient to schedule. Thanks

## 2023-02-12 NOTE — Telephone Encounter (Signed)
Didn't see this message until now.  Can we call pt to see when she can come in to our next available

## 2023-02-13 ENCOUNTER — Ambulatory Visit: Payer: Medicare PPO | Admitting: Family

## 2023-02-13 ENCOUNTER — Encounter: Payer: Self-pay | Admitting: Family

## 2023-02-13 ENCOUNTER — Encounter: Payer: Self-pay | Admitting: *Deleted

## 2023-02-13 VITALS — BP 130/78 | HR 76 | Temp 97.9°F | Ht 65.0 in | Wt 184.4 lb

## 2023-02-13 DIAGNOSIS — R051 Acute cough: Secondary | ICD-10-CM

## 2023-02-13 DIAGNOSIS — E039 Hypothyroidism, unspecified: Secondary | ICD-10-CM | POA: Diagnosis not present

## 2023-02-13 DIAGNOSIS — J029 Acute pharyngitis, unspecified: Secondary | ICD-10-CM

## 2023-02-13 DIAGNOSIS — L659 Nonscarring hair loss, unspecified: Secondary | ICD-10-CM | POA: Diagnosis not present

## 2023-02-13 DIAGNOSIS — R0989 Other specified symptoms and signs involving the circulatory and respiratory systems: Secondary | ICD-10-CM

## 2023-02-13 DIAGNOSIS — J329 Chronic sinusitis, unspecified: Secondary | ICD-10-CM

## 2023-02-13 LAB — TSH: TSH: 1.1 u[IU]/mL (ref 0.35–5.50)

## 2023-02-13 LAB — T4, FREE: Free T4: 1.03 ng/dL (ref 0.60–1.60)

## 2023-02-13 MED ORDER — PREDNISONE 10 MG (21) PO TBPK
ORAL_TABLET | ORAL | 0 refills | Status: DC
Start: 2023-02-13 — End: 2023-02-28

## 2023-02-13 NOTE — Patient Instructions (Addendum)
Recommend we start daily Flonase and nightly allergy medication and be consistent with its use to see if symptoms clear up.   Sending in  steroid pack to see if this helps.   A referral was placed today for ENT Please let us know if you have not heard back within 2 weeks about the referral.  Recommend over the counter biotin (hair skin and nails supplement)  Stop by the lab prior to leaving today. I will notify you of your results once received.    Regards,   Mort Sawyers FNP-C

## 2023-02-13 NOTE — Progress Notes (Signed)
Established Patient Office Visit  Subjective:   Patient ID: Nicole Daniels, female    DOB: [birth year]  Age: 72 y.o. MRN: [id number]  CC:  Chief Complaint  Patient presents with   Sore Throat    Ongoing   Ear Pain   Alopecia    Top, mid-section. Patient feels her hair loss is associated with thyroid medicine.    HPI: Nicole Daniels is a 72 y.o. female presenting on 02/13/2023 for Sore Throat (Ongoing), Ear Pain, and Alopecia (Top, mid-section. Patient feels her hair loss is associated with thyroid medicine.)   Sore Throat     Came in 3/27 for sore throat. Dx with strep pharyngitis, treated with amox 875 mg po bid x 10 days. She states she might have felt better for about 3-4 days after completing medication but symptoms started again. She takes flonase but not often. Does not take regular allergy medication but does have otc allergy pill. She had also had doxycycline 100 mg po bid x 10 days prior when she saw another MD in another office diagnosed with sinusitis.  Also with cough with white gooey thick sputum and chest congestion. No wheezing or sob.  Today with c/o bilateral ear pain, hurts to swallow,   Conjunctivitis bil eyes has now resolved.    Does find she is losing hair at the top of her head more so over the last few weeks. She is taking levothyroxine 75 mcg once daily, thyroid is in normal range.   Lab Results  Component Value Date   TSH 1.10 02/13/2023     ROS: Negative unless specifically indicated above in HPI.   Relevant past medical history reviewed and updated as indicated.   Allergies and medications reviewed and updated.   Current Outpatient Medications:    Cholecalciferol (VITAMIN D3) 2000 units TABS, Take 2,000 Units by mouth daily., Disp: , Rfl:    Cyanocobalamin (VITAMIN B 12 PO), Take by mouth., Disp: , Rfl:    ezetimibe (ZETIA) 10 MG tablet, Take 1 tablet (10 mg total) by mouth daily., Disp: 90 tablet, Rfl: 3   fluticasone (FLONASE)  50 MCG/ACT nasal spray, Place 1 spray into both nostrils as needed for allergies. , Disp: , Rfl:    levothyroxine (SYNTHROID) 75 MCG tablet, TAKE 1 TABLET BY MOUTH EVERY DAY, Disp: 90 tablet, Rfl: 1   Methylcobalamin 1 MG CHEW, , Disp: , Rfl:    predniSONE (STERAPRED UNI-PAK 21 TAB) 10 MG (21) TBPK tablet, Take as directed, Disp: 1 each, Rfl: 0  Allergies  Allergen Reactions   Simvastatin Other (See Comments)    MYALGIAS   Statins Other (See Comments)    Has severe leg muscle aches and cramps   Bactrim [Sulfamethoxazole-Trimethoprim] Itching   Sertraline Anxiety    Objective:   BP 130/78 (BP Location: Left Arm)   Pulse 76   Temp 97.9 F (36.6 C) (Temporal)   Ht  (1.651 m)   Wt 184 lb 6.4 oz (83.6 kg)   SpO2 98%   BMI 30.69 kg/m    Physical Exam Constitutional:      General: She is not in acute distress.    Appearance: Normal appearance. She is obese. She is not ill-appearing, toxic-appearing or diaphoretic.  HENT:     Head: Normocephalic.     Right Ear: Tympanic membrane normal.     Left Ear: A middle ear effusion (clear) is present.     Nose: Nose normal.     Mouth/Throat:  Mouth: Mucous membranes are dry.     Pharynx: Posterior oropharyngeal erythema present. No oropharyngeal exudate.  Eyes:     Extraocular Movements: Extraocular movements intact.     Pupils: Pupils are equal, round, and reactive to light.  Cardiovascular:     Rate and Rhythm: Normal rate and regular rhythm.     Pulses: Normal pulses.     Heart sounds: Normal heart sounds.  Pulmonary:     Effort: Pulmonary effort is normal.     Breath sounds: Normal breath sounds.  Musculoskeletal:     Cervical back: Normal range of motion.  Lymphadenopathy:     Cervical: Cervical adenopathy present.     Right cervical: Superficial cervical adenopathy present.     Left cervical: Superficial cervical adenopathy present.  Neurological:     General: No focal deficit present.     Mental Status: She is  alert and oriented to person, place, and time. Mental status is at baseline.  Psychiatric:        Mood and Affect: Mood normal.        Behavior: Behavior normal.        Thought Content: Thought content normal.        Judgment: Judgment normal.     Assessment & Plan:  Sore throat -     Culture, Group A Strep  Hair loss Assessment & Plan: Thyroid ordered although less likely cause  Did recommend over the counter biotin.  Cbc stable last draw less likely anemia  Orders: -     TSH -     T4, free  Hypothyroidism, unspecified type -     TSH -     T4, free  Chest congestion -     predniSONE; Take as directed  Dispense: 1 each; Refill: 0  Acute cough -     predniSONE; Take as directed  Dispense: 1 each; Refill: 0  Recurrent sinusitis Assessment & Plan: Rx prendisone pack  If no improvement please f/u  Orders: -     Ambulatory referral to ENT     Follow up plan: Return in about 6 months (around 08/15/2023) for f/u thyroid .  Mort Sawyers, FNP

## 2023-02-14 DIAGNOSIS — L659 Nonscarring hair loss, unspecified: Secondary | ICD-10-CM | POA: Insufficient documentation

## 2023-02-14 DIAGNOSIS — J329 Chronic sinusitis, unspecified: Secondary | ICD-10-CM | POA: Insufficient documentation

## 2023-02-14 NOTE — Assessment & Plan Note (Signed)
Thyroid ordered although less likely cause  Did recommend over the counter biotin.  Cbc stable last draw less likely anemia

## 2023-02-14 NOTE — Assessment & Plan Note (Signed)
Rx prendisone pack  If no improvement please f/u

## 2023-02-15 LAB — CULTURE, GROUP A STREP
MICRO NUMBER:: 14831328
SPECIMEN QUALITY:: ADEQUATE

## 2023-02-20 DIAGNOSIS — H0288A Meibomian gland dysfunction right eye, upper and lower eyelids: Secondary | ICD-10-CM | POA: Diagnosis not present

## 2023-02-20 DIAGNOSIS — Z9841 Cataract extraction status, right eye: Secondary | ICD-10-CM | POA: Diagnosis not present

## 2023-02-20 DIAGNOSIS — H52223 Regular astigmatism, bilateral: Secondary | ICD-10-CM | POA: Diagnosis not present

## 2023-02-20 DIAGNOSIS — H0288B Meibomian gland dysfunction left eye, upper and lower eyelids: Secondary | ICD-10-CM | POA: Diagnosis not present

## 2023-02-20 DIAGNOSIS — H21553 Recession of chamber angle, bilateral: Secondary | ICD-10-CM | POA: Diagnosis not present

## 2023-02-20 DIAGNOSIS — Z9842 Cataract extraction status, left eye: Secondary | ICD-10-CM | POA: Diagnosis not present

## 2023-02-28 ENCOUNTER — Ambulatory Visit: Payer: Medicare PPO | Admitting: Family

## 2023-02-28 VITALS — BP 126/78 | HR 79 | Temp 98.3°F | Ht 65.0 in | Wt 180.6 lb

## 2023-02-28 DIAGNOSIS — J029 Acute pharyngitis, unspecified: Secondary | ICD-10-CM | POA: Diagnosis not present

## 2023-02-28 DIAGNOSIS — R300 Dysuria: Secondary | ICD-10-CM | POA: Diagnosis not present

## 2023-02-28 DIAGNOSIS — J329 Chronic sinusitis, unspecified: Secondary | ICD-10-CM

## 2023-02-28 DIAGNOSIS — J020 Streptococcal pharyngitis: Secondary | ICD-10-CM | POA: Diagnosis not present

## 2023-02-28 LAB — POCT RAPID STREP A (OFFICE): Rapid Strep A Screen: POSITIVE — AB

## 2023-02-28 MED ORDER — CEPHALEXIN 500 MG PO CAPS
500.0000 mg | ORAL_CAPSULE | Freq: Two times a day (BID) | ORAL | 0 refills | Status: AC
Start: 2023-02-28 — End: 2023-03-10

## 2023-02-28 NOTE — Assessment & Plan Note (Signed)
Strep tested positive in office.  rx for cephalexin 500 mg po bid x 10 days  Ibuprofen/tyelnol prn sore throat/fever Pt told to F/u if no improvement in the next 2-3 days.

## 2023-02-28 NOTE — Progress Notes (Signed)
Established Patient Office Visit  Subjective:   Patient ID: Nicole Daniels, female    DOB: 1951/02/11  Age: 72 y.o. MRN: [id number]  CC:  Chief Complaint  Patient presents with   Ear Pain   Sinus Problem   Vaginal Itching    HPI: Nicole Daniels is a 72 y.o. female presenting on 02/28/2023 for Ear Pain, Sinus Problem, and Vaginal Itching   Sinus Problem  Vaginal Itching    Last seen 4/16 was given prednisone pack and referral was placed for ENT for recurrent sinusitis. Completed augmentin and doxycycline prior to ongoing sinus infection. She does state she started taking daily flonase and allergy medication, with some improvement of her symptoms for a few weeks, but then yesterday started with pain in her neck with her lymph notes, increased lethargy and weakness, and then with nasal congestion and sinus pressure. She did note a sinus headache. Feels like she may have had a fever. Today with only slight improvement of symptoms however worried this might keep on.   C/o bright yellow urine and urinary pressure. She states slight vaginal itching. No abn vaginal discharge. Noticed this about 3-4 days ago. No frequency of urine.          ROS: Negative unless specifically indicated above in HPI.   Relevant past medical history reviewed and updated as indicated.   Allergies and medications reviewed and updated.   Current Outpatient Medications:    cephALEXin (KEFLEX) 500 MG capsule, Take 1 capsule (500 mg total) by mouth 2 (two) times daily for 10 days., Disp: 20 capsule, Rfl: 0   Cholecalciferol (VITAMIN D3) 2000 units TABS, Take 2,000 Units by mouth daily., Disp: , Rfl:    Cyanocobalamin (VITAMIN B 12 PO), Take by mouth., Disp: , Rfl:    ezetimibe (ZETIA) 10 MG tablet, Take 1 tablet (10 mg total) by mouth daily., Disp: 90 tablet, Rfl: 3   fluticasone (FLONASE) 50 MCG/ACT nasal spray, Place 1 spray into both nostrils as needed for allergies. , Disp: , Rfl:     levothyroxine (SYNTHROID) 75 MCG tablet, TAKE 1 TABLET BY MOUTH EVERY DAY, Disp: 90 tablet, Rfl: 1  Allergies  Allergen Reactions   Simvastatin Other (See Comments)    MYALGIAS   Statins Other (See Comments)    Has severe leg muscle aches and cramps   Bactrim [Sulfamethoxazole-Trimethoprim] Itching   Sertraline Anxiety    Objective:   BP 126/78   Pulse 79   Temp 98.3 F (36.8 C) (Temporal)   Ht 5\' 5"  (1.651 m)   Wt 180 lb 9.6 oz (81.9 kg)   SpO2 98%   BMI 30.05 kg/m    Physical Exam Constitutional:      General: She is not in acute distress.    Appearance: Normal appearance. She is normal weight. She is not ill-appearing, toxic-appearing or diaphoretic.  HENT:     Head: Normocephalic.     Right Ear: Tympanic membrane normal.     Left Ear: Tympanic membrane normal.     Nose: Nose normal.     Mouth/Throat:     Mouth: Mucous membranes are dry.     Pharynx: Posterior oropharyngeal erythema present. No oropharyngeal exudate.  Eyes:     Extraocular Movements: Extraocular movements intact.     Pupils: Pupils are equal, round, and reactive to light.  Cardiovascular:     Rate and Rhythm: Normal rate and regular rhythm.     Pulses: Normal pulses.     Heart  sounds: Normal heart sounds.  Pulmonary:     Effort: Pulmonary effort is normal.     Breath sounds: Normal breath sounds.  Abdominal:     Tenderness: There is no right CVA tenderness or left CVA tenderness.  Musculoskeletal:     Cervical back: Normal range of motion.  Lymphadenopathy:     Cervical: Cervical adenopathy present.     Right cervical: Superficial cervical adenopathy present.     Left cervical: Superficial cervical adenopathy present.  Neurological:     General: No focal deficit present.     Mental Status: She is alert and oriented to person, place, and time. Mental status is at baseline.  Psychiatric:        Mood and Affect: Mood normal.        Behavior: Behavior normal.        Thought Content: Thought  content normal.        Judgment: Judgment normal.     Assessment & Plan:  Dysuria -     POCT Urinalysis Dipstick (Automated)  Sore throat -     POCT rapid strep A  Strep pharyngitis Assessment & Plan: Strep tested positive in office.  rx for cephalexin 500 mg po bid x 10 days  Ibuprofen/tyelnol prn sore throat/fever Pt told to F/u if no improvement in the next 2-3 days.   Orders: -     Cephalexin; Take 1 capsule (500 mg total) by mouth 2 (two) times daily for 10 days.  Dispense: 20 capsule; Refill: 0  Recurrent sinusitis -     Ambulatory referral to ENT     Follow up plan: Return if symptoms worsen or fail to improve.  Mort Sawyers, FNP

## 2023-02-28 NOTE — Patient Instructions (Signed)
 ------------------------------------    You were found to be strep positive,  Take antibiotics that have been sent to the pharmacy.  Change your toothbrush after 24 hours on the antibiotics.  Gargle with warm salt water as needed for sore throat.   ------------------------------------  Recommend over the counter probiotic.    Regards,   Mort Sawyers FNP-C

## 2023-03-01 ENCOUNTER — Encounter: Payer: Self-pay | Admitting: *Deleted

## 2023-03-19 ENCOUNTER — Telehealth: Payer: Self-pay | Admitting: Family

## 2023-03-19 NOTE — Telephone Encounter (Signed)
Patient contacted the office regarding her visit on 5/1, states she also discussed uti symptoms at this visit. Says she was asked to give a urine sample but was not able to at the time. Says her symptoms are still the same and wants to know if Tabitha could possibly send something in for her. Please advise, [phone].

## 2023-03-19 NOTE — Telephone Encounter (Signed)
Called patient states that when she was here last she was not able to give urine sample. Advised patient it has been several days an you would need to see her in office to evaluate. Patient states her urinary symptoms have not changed and Tabitha told her she could bring by urine. Advised patient that I will send message to Tabitha to get her instructions and will call back after. Patient aware will not be reviewed until Wyatt Mage is back in office tomorrow. Reviewed red words if any will go to walk in or ED

## 2023-03-20 ENCOUNTER — Other Ambulatory Visit: Payer: Self-pay | Admitting: Radiology

## 2023-03-20 DIAGNOSIS — R300 Dysuria: Secondary | ICD-10-CM

## 2023-03-20 NOTE — Telephone Encounter (Signed)
Yes she can bring by urine. I had put in a future order for her when I had evaluated her.

## 2023-03-20 NOTE — Telephone Encounter (Signed)
Patient is aware and will stop by the office.

## 2023-03-21 ENCOUNTER — Encounter: Payer: Self-pay | Admitting: Family

## 2023-03-21 ENCOUNTER — Ambulatory Visit: Payer: Medicare PPO | Admitting: Family

## 2023-03-21 ENCOUNTER — Other Ambulatory Visit (HOSPITAL_COMMUNITY)
Admission: RE | Admit: 2023-03-21 | Discharge: 2023-03-21 | Disposition: A | Discharge status: Home / Self Care | Payer: Medicare PPO | Source: Ambulatory Visit | Attending: Family | Admitting: Family

## 2023-03-21 VITALS — BP 124/68 | HR 68 | Temp 97.6°F | Ht 65.0 in | Wt 185.0 lb

## 2023-03-21 DIAGNOSIS — R7303 Prediabetes: Secondary | ICD-10-CM | POA: Diagnosis not present

## 2023-03-21 DIAGNOSIS — J329 Chronic sinusitis, unspecified: Secondary | ICD-10-CM | POA: Diagnosis not present

## 2023-03-21 DIAGNOSIS — J3489 Other specified disorders of nose and nasal sinuses: Secondary | ICD-10-CM

## 2023-03-21 DIAGNOSIS — R61 Generalized hyperhidrosis: Secondary | ICD-10-CM

## 2023-03-21 DIAGNOSIS — E039 Hypothyroidism, unspecified: Secondary | ICD-10-CM

## 2023-03-21 DIAGNOSIS — E876 Hypokalemia: Secondary | ICD-10-CM | POA: Diagnosis not present

## 2023-03-21 DIAGNOSIS — N898 Other specified noninflammatory disorders of vagina: Secondary | ICD-10-CM | POA: Insufficient documentation

## 2023-03-21 DIAGNOSIS — Z1211 Encounter for screening for malignant neoplasm of colon: Secondary | ICD-10-CM

## 2023-03-21 DIAGNOSIS — H8113 Benign paroxysmal vertigo, bilateral: Secondary | ICD-10-CM | POA: Diagnosis not present

## 2023-03-21 DIAGNOSIS — D72829 Elevated white blood cell count, unspecified: Secondary | ICD-10-CM

## 2023-03-21 LAB — BASIC METABOLIC PANEL
BUN: 12 mg/dL (ref 6–23)
CO2: 29 mEq/L (ref 19–32)
Calcium: 9.8 mg/dL (ref 8.4–10.5)
Chloride: 103 mEq/L (ref 96–112)
Creatinine, Ser: 0.76 mg/dL (ref 0.40–1.20)
GFR: 77.94 mL/min (ref >60.00)
Glucose, Bld: 99 mg/dL (ref 70–99)
Potassium: 4.4 mEq/L (ref 3.5–5.1)
Sodium: 141 mEq/L (ref 135–145)

## 2023-03-21 LAB — URINALYSIS W MICROSCOPIC + REFLEX CULTURE
Bacteria, UA: NONE SEEN /HPF
Bilirubin Urine: NEGATIVE
Glucose, UA: NEGATIVE
Hgb urine dipstick: NEGATIVE
Ketones, ur: NEGATIVE
Leukocyte Esterase: NEGATIVE
Nitrites, Initial: NEGATIVE
Specific Gravity, Urine: 1.019 (ref 1.001–1.035)
pH: 6 (ref 5.0–8.0)

## 2023-03-21 LAB — CBC
HCT: 44.4 % (ref 36.0–46.0)
Hemoglobin: 14.5 g/dL (ref 12.0–15.0)
MCHC: 32.7 g/dL (ref 30.0–36.0)
MCV: 92.5 fl (ref 78.0–100.0)
Platelets: 410 10*3/uL — ABNORMAL HIGH (ref 150.0–400.0)
RBC: 4.8 Mil/uL (ref 3.87–5.11)
RDW: 13.3 % (ref 11.5–15.5)
WBC: 5.4 10*3/uL (ref 4.0–10.5)

## 2023-03-21 LAB — SEDIMENTATION RATE: Sed Rate: 14 mm/hr (ref 0–30)

## 2023-03-21 LAB — TSH: TSH: 1.34 u[IU]/mL (ref 0.35–5.50)

## 2023-03-21 LAB — HEMOGLOBIN A1C: Hgb A1c MFr Bld: 5.6 % (ref 4.6–6.5)

## 2023-03-21 LAB — NO CULTURE INDICATED

## 2023-03-21 MED ORDER — PREDNISONE 10 MG (21) PO TBPK
ORAL_TABLET | ORAL | 0 refills | Status: DC
Start: 2023-03-21 — End: 2023-04-16

## 2023-03-21 MED ORDER — MECLIZINE HCL 12.5 MG PO TABS
12.5000 mg | ORAL_TABLET | Freq: Three times a day (TID) | ORAL | 0 refills | Status: DC | PRN
Start: 2023-03-21 — End: 2024-06-24

## 2023-03-21 NOTE — Assessment & Plan Note (Signed)
Urine ancillary ordered pending resuts.  Suspect yeast.

## 2023-03-21 NOTE — Progress Notes (Signed)
Established Patient Office Visit  Subjective:   Patient ID: Nicole Daniels, female    DOB: 1951/10/29  Age: 72 y.o. MRN: [id number]  CC:  Chief Complaint  Patient presents with   Fatigue   Dizziness   Excessive Sweating    HPI: Nicole Daniels is a 72 y.o. female presenting on 03/21/2023 for Fatigue, Dizziness, and Excessive Sweating   Dizziness    Yesterday started with dizzy spells, feeling lightheaded, even when sitting still feels like she is full of 'crap' such as congestion and sinus pressure. She states with very little energy feeling very fatigued. No ear fullness. No sore throat. No cough. She does have hoarseness.   She also reports some excessive sweating that she reports occurs occasionally. She states will notice after showering and then she has to sit down because she is overly tired. This is even these initial symptoms.   Lab Results  Component Value Date   TSH 1.10 02/13/2023   T3TOTAL 148 10/17/2019    No fever or chills.   She is taking flonase and was taking an allergy pill but then switched to allergy sinus pill.           ROS: Negative unless specifically indicated above in HPI.   Relevant past medical history reviewed and updated as indicated.   Allergies and medications reviewed and updated.   Current Outpatient Medications:    Cholecalciferol (VITAMIN D3) 2000 units TABS, Take 2,000 Units by mouth daily., Disp: , Rfl:    Cyanocobalamin (VITAMIN B 12 PO), Take by mouth., Disp: , Rfl:    ezetimibe (ZETIA) 10 MG tablet, Take 1 tablet (10 mg total) by mouth daily., Disp: 90 tablet, Rfl: 3   fluticasone (FLONASE) 50 MCG/ACT nasal spray, Place 1 spray into both nostrils as needed for allergies. , Disp: , Rfl:    levothyroxine (SYNTHROID) 75 MCG tablet, TAKE 1 TABLET BY MOUTH EVERY DAY, Disp: 90 tablet, Rfl: 1   meclizine (ANTIVERT) 12.5 MG tablet, Take 1 tablet (12.5 mg total) by mouth 3 (three) times daily as needed for dizziness.,  Disp: 30 tablet, Rfl: 0   predniSONE (STERAPRED UNI-PAK 21 TAB) 10 MG (21) TBPK tablet, Take as directed, Disp: 1 each, Rfl: 0  Allergies  Allergen Reactions   Simvastatin Other (See Comments)    MYALGIAS   Statins Other (See Comments)    Has severe leg muscle aches and cramps   Bactrim [Sulfamethoxazole-Trimethoprim] Itching   Sertraline Anxiety    Objective:   BP 124/68 (BP Location: Left Arm)   Pulse 68   Temp 97.6 F (36.4 C) (Temporal)   Ht 5\' 5"  (1.651 m)   Wt 185 lb (83.9 kg)   SpO2 97%   BMI 30.79 kg/m    Physical Exam Constitutional:      General: She is not in acute distress.    Appearance: Normal appearance. She is normal weight. She is not ill-appearing, toxic-appearing or diaphoretic.  HENT:     Head: Normocephalic.     Right Ear: A middle ear effusion is present.     Left Ear: A middle ear effusion is present.     Nose: Nose normal.     Right Turbinates: Swollen and pale.     Mouth/Throat:     Mouth: Mucous membranes are dry.     Pharynx: No oropharyngeal exudate or posterior oropharyngeal erythema.  Eyes:     Extraocular Movements: Extraocular movements intact.     Pupils: Pupils are equal,  round, and reactive to light.  Cardiovascular:     Rate and Rhythm: Normal rate and regular rhythm.     Pulses: Normal pulses.     Heart sounds: Normal heart sounds.  Pulmonary:     Effort: Pulmonary effort is normal.     Breath sounds: Normal breath sounds.  Musculoskeletal:     Cervical back: Normal range of motion.  Neurological:     General: No focal deficit present.     Mental Status: She is alert and oriented to person, place, and time. Mental status is at baseline.     Cranial Nerves: Cranial nerves 2-12 are intact.     Sensory: Sensation is intact.     Motor: Motor function is intact.     Coordination: Coordination is intact.     Gait: Gait abnormal (a bit unsteady with turning head).     Comments: Positive dix Hallpike maneuver bil   Psychiatric:         Mood and Affect: Mood normal.        Behavior: Behavior normal.        Thought Content: Thought content normal.        Judgment: Judgment normal.     Assessment & Plan:  Vaginal discharge Assessment & Plan: Urine ancillary ordered pending resuts.  Suspect yeast.   Orders: -     Urine cytology ancillary only  Recurrent sinusitis Assessment & Plan: Was not contacted in regards to ENT referral to get scheduled. Sent message to referral coordinator to look into this, advised pt they should reach out to her.    Orders: -     Meclizine HCl; Take 1 tablet (12.5 mg total) by mouth 3 (three) times daily as needed for dizziness.  Dispense: 30 tablet; Refill: 0 -     Ambulatory referral to ENT  Benign paroxysmal positional vertigo due to bilateral vestibular disorder Assessment & Plan: Advised pt to stand and change position slowly and try to prevent falls by using cane as needed Rx meclizine 12.5 mg once daily prn vertigo symptoms advised may make her sleepy  Rx prednisone pack for sinus pressure and fluid in ears.  Advised to use antihistamine + flonase to help with allergies   Orders: -     Meclizine HCl; Take 1 tablet (12.5 mg total) by mouth 3 (three) times daily as needed for dizziness.  Dispense: 30 tablet; Refill: 0 -     Ambulatory referral to ENT  Sinus pressure -     predniSONE; Take as directed  Dispense: 1 each; Refill: 0 -     Sedimentation rate  Hypokalemia Assessment & Plan: Repeat potassium today pending results.  Orders: -     Basic metabolic panel  Prediabetes -     Basic metabolic panel -     Hemoglobin A1c  Leukocytosis, unspecified type Assessment & Plan: Repeat cbc pending results  Orders: -     CBC  Excessive sweating -     TSH  Hypothyroidism, unspecified type -     TSH     Follow up plan: Return for f/u as scheduled for CPE.  Mort Sawyers, FNP

## 2023-03-21 NOTE — Assessment & Plan Note (Signed)
Repeat potassium today pending results ?

## 2023-03-21 NOTE — Assessment & Plan Note (Signed)
Repeat cbc pending results 

## 2023-03-21 NOTE — Addendum Note (Signed)
Addended by: Donnamarie Poag on: 03/21/2023 11:16 AM   Modules accepted: Orders

## 2023-03-21 NOTE — Assessment & Plan Note (Signed)
Advised pt to stand and change position slowly and try to prevent falls by using cane as needed Rx meclizine 12.5 mg once daily prn vertigo symptoms advised may make her sleepy  Rx prednisone pack for sinus pressure and fluid in ears.  Advised to use antihistamine + flonase to help with allergies

## 2023-03-21 NOTE — Assessment & Plan Note (Signed)
Was not contacted in regards to ENT referral to get scheduled. Sent message to referral coordinator to look into this, advised pt they should reach out to her.

## 2023-03-21 NOTE — Patient Instructions (Addendum)
  Take meclizine as needed for dizziness.  Rise slowly upon standing and or sitting.   Continue flonase daily  Start claritin, zyrtec, xyzal and or allegra.   I'm looking into the referral to the Ent.    Regards,   Mort Sawyers FNP-C

## 2023-03-22 LAB — URINE CYTOLOGY ANCILLARY ONLY
Bacterial Vaginitis-Urine: NEGATIVE
Candida Urine: NEGATIVE

## 2023-03-23 ENCOUNTER — Telehealth: Payer: Self-pay

## 2023-03-23 ENCOUNTER — Other Ambulatory Visit (INDEPENDENT_AMBULATORY_CARE_PROVIDER_SITE_OTHER): Payer: Medicare PPO | Admitting: Radiology

## 2023-03-23 DIAGNOSIS — Z1211 Encounter for screening for malignant neoplasm of colon: Secondary | ICD-10-CM | POA: Diagnosis not present

## 2023-03-23 LAB — FECAL OCCULT BLOOD, IMMUNOCHEMICAL: Fecal Occult Bld: POSITIVE — AB

## 2023-03-23 NOTE — Telephone Encounter (Signed)
Lab called to let us know that pt has a + IFOB

## 2023-03-26 NOTE — Progress Notes (Signed)
Patient recently found to have positive IFOB. No anemia seen on CBC Last colonoscopy 2022 with multiple polyps. Would you like to get her into your office for further evaluation?   Nicole Daniels, I see Dr. Orvan Falconer is out can we forward this to on call physician?

## 2023-03-26 NOTE — Telephone Encounter (Signed)
Noted  

## 2023-03-28 ENCOUNTER — Telehealth: Payer: Self-pay

## 2023-03-28 NOTE — Telephone Encounter (Signed)
Patient called back states she seen pos I fob on my chart and had not received call from our office. I advised patient that Wyatt Mage has sent message to GI for direction and that she had not received response yet. Let patient know that I would send message to her to review and let know that patient had called to follow up.

## 2023-03-29 ENCOUNTER — Telehealth: Payer: Self-pay | Admitting: Internal Medicine

## 2023-03-29 NOTE — Telephone Encounter (Signed)
Responded to pt via mychart

## 2023-03-29 NOTE — Telephone Encounter (Signed)
PT was with [facility] but is requesting Dr. Leonides Schanz. She had a positive cologuard and was referred to have a colonoscopy. Her recall is not until 05/2024 and she wants it earlier. Please advise

## 2023-03-29 NOTE — Telephone Encounter (Signed)
Reviewed patient labs. Positive IFOB. Normal hemoglobin. Last colonoscopy in 06/16/2021. Recall for 3 years. Please review and advise.

## 2023-03-30 ENCOUNTER — Encounter: Payer: Self-pay | Admitting: Internal Medicine

## 2023-03-30 NOTE — Telephone Encounter (Signed)
Left VM to schedule 

## 2023-04-16 ENCOUNTER — Other Ambulatory Visit: Payer: Self-pay

## 2023-04-16 ENCOUNTER — Other Ambulatory Visit: Payer: Self-pay | Admitting: Internal Medicine

## 2023-04-16 ENCOUNTER — Encounter: Payer: Self-pay | Admitting: Internal Medicine

## 2023-04-16 ENCOUNTER — Ambulatory Visit (AMBULATORY_SURGERY_CENTER): Payer: Medicare PPO

## 2023-04-16 VITALS — Ht 65.0 in | Wt 183.0 lb

## 2023-04-16 DIAGNOSIS — Z86010 Personal history of colonic polyps: Secondary | ICD-10-CM

## 2023-04-16 DIAGNOSIS — R195 Other fecal abnormalities: Secondary | ICD-10-CM

## 2023-04-16 MED ORDER — SUTAB 1479-225-188 MG PO TABS
1.0000 | ORAL_TABLET | ORAL | 0 refills | Status: DC
Start: 2023-04-16 — End: 2023-05-08

## 2023-04-16 NOTE — Progress Notes (Signed)
Denies allergies to eggs or soy products. Denies complication of anesthesia or sedation. Denies use of weight loss medication. Denies use of O2.   Emmi instructions given for colonoscopy.  

## 2023-04-17 NOTE — Telephone Encounter (Signed)
Called and spoke with pharmacist- Weston Brass at [facility] - RN gave him the [facility], PCN, group #, and member id to a sutab coupon for medicare as it is reported that the patient's insurance does not cover the medication -with Sutab coupon the price the patient will pay is $50.00 Weston Brass reports he will notify the patient of the cost of the prep

## 2023-05-08 ENCOUNTER — Encounter: Payer: Self-pay | Admitting: Internal Medicine

## 2023-05-08 ENCOUNTER — Ambulatory Visit (AMBULATORY_SURGERY_CENTER): Payer: Medicare PPO | Admitting: Internal Medicine

## 2023-05-08 VITALS — BP 134/82 | HR 69 | Temp 97.8°F | Resp 12 | Ht 65.0 in | Wt 183.0 lb

## 2023-05-08 DIAGNOSIS — D125 Benign neoplasm of sigmoid colon: Secondary | ICD-10-CM

## 2023-05-08 DIAGNOSIS — E669 Obesity, unspecified: Secondary | ICD-10-CM | POA: Diagnosis not present

## 2023-05-08 DIAGNOSIS — Z86010 Personal history of colonic polyps: Secondary | ICD-10-CM | POA: Diagnosis not present

## 2023-05-08 DIAGNOSIS — F419 Anxiety disorder, unspecified: Secondary | ICD-10-CM | POA: Diagnosis not present

## 2023-05-08 DIAGNOSIS — R195 Other fecal abnormalities: Secondary | ICD-10-CM

## 2023-05-08 DIAGNOSIS — Z09 Encounter for follow-up examination after completed treatment for conditions other than malignant neoplasm: Secondary | ICD-10-CM | POA: Diagnosis not present

## 2023-05-08 DIAGNOSIS — D120 Benign neoplasm of cecum: Secondary | ICD-10-CM | POA: Diagnosis not present

## 2023-05-08 DIAGNOSIS — K635 Polyp of colon: Secondary | ICD-10-CM | POA: Diagnosis not present

## 2023-05-08 DIAGNOSIS — D122 Benign neoplasm of ascending colon: Secondary | ICD-10-CM

## 2023-05-08 DIAGNOSIS — M1990 Unspecified osteoarthritis, unspecified site: Secondary | ICD-10-CM | POA: Diagnosis not present

## 2023-05-08 MED ORDER — SODIUM CHLORIDE 0.9 % IV SOLN
500.0000 mL | INTRAVENOUS | Status: DC
Start: 2023-05-08 — End: 2023-05-08

## 2023-05-08 MED ORDER — HYDROCORTISONE (PERIANAL) 2.5 % EX CREA
1.0000 | TOPICAL_CREAM | Freq: Two times a day (BID) | CUTANEOUS | 0 refills | Status: DC
Start: 2023-05-08 — End: 2023-10-11

## 2023-05-08 NOTE — Progress Notes (Signed)
Sedate, gd SR, tolerated procedure well, VSS, report to RN 

## 2023-05-08 NOTE — Progress Notes (Signed)
Called to room to assist during endoscopic procedure.  Patient ID and intended procedure confirmed with present staff. Received instructions for my participation in the procedure from the performing physician.  

## 2023-05-08 NOTE — Patient Instructions (Signed)
Resume al of your previous medications as ordered today.  Use your annusol as ordered.  Read your discharge instructions.  YOU HAD AN ENDOSCOPIC PROCEDURE TODAY AT THE [facility]:   Refer to the procedure report that was given to you for any specific questions about what was found during the examination.  If the procedure report does not answer your questions, please call your gastroenterologist to clarify.  If you requested that your care partner not be given the details of your procedure findings, then the procedure report has been included in a sealed envelope for you to review at your convenience later.  YOU SHOULD EXPECT: Some feelings of bloating in the abdomen. Passage of more gas than usual.  Walking can help get rid of the air that was put into your GI tract during the procedure and reduce the bloating. If you had a lower endoscopy (such as a colonoscopy or flexible sigmoidoscopy) you may notice spotting of blood in your stool or on the toilet paper. If you underwent a bowel prep for your procedure, you may not have a normal bowel movement for a few days.  Please Note:  You might notice some irritation and congestion in your nose or some drainage.  This is from the oxygen used during your procedure.  There is no need for concern and it should clear up in a day or so.  SYMPTOMS TO REPORT IMMEDIATELY:  Following lower endoscopy (colonoscopy or flexible sigmoidoscopy):  Excessive amounts of blood in the stool  Significant tenderness or worsening of abdominal pains  Swelling of the abdomen that is new, acute  Fever of 100F or higher   For urgent or emergent issues, a gastroenterologist can be reached at any hour by calling [phone]. Do not use MyChart messaging for urgent concerns.    DIET:  We do recommend a small meal at first, but then you may proceed to your regular diet.  Drink plenty of fluids but you should avoid alcoholic beverages for 24 hours. Try to increase  the fiber in your diet, and drink plenty of water.  ACTIVITY:  You should plan to take it easy for the rest of today and you should NOT DRIVE or use heavy machinery until tomorrow (because of the sedation medicines used during the test).    FOLLOW UP: Our staff will call the number listed on your records the next business day following your procedure.  We will call around 7:15- 8:00 am to check on you and address any questions or concerns that you may have regarding the information given to you following your procedure. If we do not reach you, we will leave a message.     If any biopsies were taken you will be contacted by phone or by letter within the next 1-3 weeks.  Please call us at [phone] if you have not heard about the biopsies in 3 weeks.    SIGNATURES/CONFIDENTIALITY: You and/or your care partner have signed paperwork which will be entered into your electronic medical record.  These signatures attest to the fact that that the information above on your After Visit Summary has been reviewed and is understood.  Full responsibility of the confidentiality of this discharge information lies with you and/or your care-partner.

## 2023-05-08 NOTE — Progress Notes (Signed)
GASTROENTEROLOGY PROCEDURE H&P NOTE   Primary Care Physician: Mort Sawyers, FNP    Reason for Procedure:   Positive FOBT, history of colon polyps  Plan:    Colonoscopy  Patient is appropriate for endoscopic procedure(s) in the ambulatory ([facility]) setting.  The nature of the procedure, as well as the risks, benefits, and alternatives were carefully and thoroughly reviewed with the patient. Ample time for discussion and questions allowed. The patient understood, was satisfied, and agreed to proceed.     HPI: Nicole Daniels is a 72 y.o. female who presents for colonoscopy for a positive FOBT and history of colon polyps. Denies blood in stools, changes in bowel habits, or unintentional weight loss. Denies family history of colon cancer.  Past Medical History:  Diagnosis Date   Abdominal pain, right lower quadrant    Acquired cyst of kidney 01/08/2012   pt denies history of    Acute sinusitis, unspecified    Adenomatous colon polyp    Allergy    Angiomyolipoma    kidney   Anxiety    OCCASIONAL   Cataract    Chest pain    Chest pain, unspecified    Depressive disorder, not elsewhere classified 01/08/2012   not currently    Diverticulosis    Dizziness and giddiness    Dysfunction of eustachian tube    H/O Spinal surgery 02/20/2017   Hemorrhoids    History of diverticulitis of colon    History of fusion of cervical spine 04/25/2018   IBS (irritable bowel syndrome)    Liver lesion 2011   Neoplasm of skin of face    malignant   OA (osteoarthritis)    knee, NECK   Obesity, unspecified    Ostium secundum type atrial septal defect    Other and unspecified hyperlipidemia    Other malaise and fatigue    Palpitations    Shortness of breath    Thyroid disease    Unspecified essential hypertension    pt states she does not have HTN   Unspecified sinusitis (chronic)    Unspecified vitamin D deficiency     Past Surgical History:  Procedure Laterality Date    ABDOMINAL HYSTERECTOMY     BREAST REDUCTION SURGERY  1990   CERVICAL DISC SURGERY     Dr.Nudleman   CHOLECYSTECTOMY     COLONOSCOPY     DILATION AND CURETTAGE OF UTERUS     EYE SURGERY Bilateral 07/30/2017   cataract sx   FLEXIBLE SIGMOIDOSCOPY  01/08/2012   Procedure: FLEXIBLE SIGMOIDOSCOPY;  Surgeon: Hart Carwin, MD;  Location: [facility];  Service: Endoscopy;  Laterality: N/A;   HEMORRHOID SURGERY     KNEE ARTHROTOMY     pt states no    LAMINECTOMY N/A 01/31/2017   Procedure: Thoracic Laminectomy and marsupialization of arachnoid cyst;  Surgeon: Shirlean Kelly, MD;  Location: [facility] OR;  Service: Neurosurgery;  Laterality: N/A;   LEFT OOPHORECTOMY  1976   NECK SURGERY N/A 03/2018   PARTIAL HYSTERECTOMY     right ovary remains   REDUCTION MAMMAPLASTY     TONSILLECTOMY  1972   UPPER GASTROINTESTINAL ENDOSCOPY      Prior to Admission medications   Medication Sig Start Date End Date Taking? Authorizing Provider  acetaminophen (TYLENOL) 325 MG tablet Take 650 mg by mouth every 6 (six) hours as needed.   Yes [provider]  Cholecalciferol (VITAMIN D3) 2000 units TABS Take 2,000 Units by mouth daily.   Yes [provider]  Cyanocobalamin (VITAMIN B 12 PO) Take by mouth.   Yes [provider]  ezetimibe (ZETIA) 10 MG tablet Take 1 tablet (10 mg total) by mouth daily. 05/29/22  Yes Dugal, Wyatt Mage, FNP  levothyroxine (SYNTHROID) 75 MCG tablet TAKE 1 TABLET BY MOUTH EVERY DAY 12/13/22  Yes Dugal, Tabitha, FNP  fluticasone (FLONASE) 50 MCG/ACT nasal spray Place 1 spray into both nostrils as needed for allergies.     [provider]  meclizine (ANTIVERT) 12.5 MG tablet Take 1 tablet (12.5 mg total) by mouth 3 (three) times daily as needed for dizziness. Patient not taking: Reported on 04/16/2023 03/21/23   Mort Sawyers, FNP  OVER THE COUNTER MEDICATION Sinus and pain, take PRN.    [provider]  OVER THE COUNTER MEDICATION Pro-biotic one  capsule daily.    [provider]    Current Outpatient Medications  Medication Sig Dispense Refill   acetaminophen (TYLENOL) 325 MG tablet Take 650 mg by mouth every 6 (six) hours as needed.     Cholecalciferol (VITAMIN D3) 2000 units TABS Take 2,000 Units by mouth daily.     Cyanocobalamin (VITAMIN B 12 PO) Take by mouth.     ezetimibe (ZETIA) 10 MG tablet Take 1 tablet (10 mg total) by mouth daily. 90 tablet 3   levothyroxine (SYNTHROID) 75 MCG tablet TAKE 1 TABLET BY MOUTH EVERY DAY 90 tablet 1   fluticasone (FLONASE) 50 MCG/ACT nasal spray Place 1 spray into both nostrils as needed for allergies.      meclizine (ANTIVERT) 12.5 MG tablet Take 1 tablet (12.5 mg total) by mouth 3 (three) times daily as needed for dizziness. (Patient not taking: Reported on 04/16/2023) 30 tablet 0   OVER THE COUNTER MEDICATION Sinus and pain, take PRN.     OVER THE COUNTER MEDICATION Pro-biotic one capsule daily.     Current Facility-Administered Medications  Medication Dose Route Frequency Provider Last Rate Last Admin   0.9 %  sodium chloride infusion  500 mL Intravenous Continuous Imogene Burn, MD        Allergies as of 05/08/2023 - Review Complete 05/08/2023  Allergen Reaction Noted   Simvastatin Other (See Comments) 01/27/2011   Statins Other (See Comments) 05/14/2017   Bactrim [sulfamethoxazole-trimethoprim] Itching 11/12/2013   Sertraline Anxiety 04/10/2019    Family History  Problem Relation Age of Onset   Hypertension Mother    Diabetes Mother    Cirrhosis Father    Heart disease Father    Hypertension Sister    Diabetes Sister    Heart disease Brother        x 2   Breast cancer Cousin    Diabetes Cousin    Diabetes Other        aunt   Stroke Other        aunt   Colon cancer Neg Hx    Esophageal cancer Neg Hx    Stomach cancer Neg Hx    Rectal cancer Neg Hx     Social History   Socioeconomic History   Marital status: Widowed    Spouse name: Not on file    Number of children: 2   Years of education: Not on file   Highest education level: 12th grade  Occupational History   Occupation: retired  Tobacco Use   Smoking status: Never   Smokeless tobacco: Never  Vaping Use   Vaping Use: Never used  Substance and Sexual Activity   Alcohol use: No   Drug use: No  Sexual activity: Yes    Partners: Male  Other Topics Concern   Not on file  Social History Narrative   Daily caffeine    Social Determinants of Health   Financial Resource Strain: Low Risk  (02/28/2023)   Overall Financial Resource Strain (CARDIA)    Difficulty of Paying Living Expenses: Not hard at all  Food Insecurity: No Food Insecurity (02/28/2023)   Hunger Vital Sign    Worried About Running Out of Food in the Last Year: Never true    Ran Out of Food in the Last Year: Never true  Transportation Needs: No Transportation Needs (02/28/2023)   PRAPARE - Administrator, Civil Service (Medical): No    Lack of Transportation (Non-Medical): No  Physical Activity: Insufficiently Active (02/28/2023)   Exercise Vital Sign    Days of Exercise per Week: 3 days    Minutes of Exercise per Session: 20 min  Stress: No Stress Concern Present (02/28/2023)   [employer] of Occupational Health - Occupational Stress Questionnaire    Feeling of Stress : Not at all  Social Connections: Moderately Integrated (02/28/2023)   Social Connection and Isolation Panel [NHANES]    Frequency of Communication with Friends and Family: More than three times a week    Frequency of Social Gatherings with Friends and Family: Twice a week    Attends Religious Services: More than 4 times per year    Active Member of [employer] or Organizations: Yes    Attends Banker Meetings: More than 4 times per year    Marital Status: Widowed  Intimate Partner Violence: Not At Risk (08/16/2022)   Humiliation, Afraid, Rape, and Kick questionnaire    Fear of Current or Ex-Partner: No    Emotionally Abused:  No    Physically Abused: No    Sexually Abused: No    Physical Exam: Vital signs in last 24 hours: BP (!) 145/81   Pulse 79   Temp 97.8 F (36.6 C)   Ht 5\' 5"  (1.651 m)   Wt 183 lb (83 kg)   SpO2 94%   BMI 30.45 kg/m  GEN: NAD EYE: Sclerae anicteric ENT: MMM CV: Non-tachycardic Pulm: No increased work of breathing GI: Soft, NT/ND NEURO:  Alert & Oriented   Eulah Pont, MD [facility]  05/08/2023 8:50 AM

## 2023-05-08 NOTE — Op Note (Addendum)
[facility] Patient Name: Nicole Daniels Procedure Date: 05/08/2023 9:26 AM MRN: [id number] Endoscopist: Madelyn Brunner Rosebud , , [id number] Age: 72 Referring MD:  Date of Birth: [birth year] Gender: Female Account #: [id number] Procedure:                Colonoscopy Indications:              High risk colon cancer surveillance: Personal                            history of colonic polyps, positive FIT test Medicines:                Monitored Anesthesia Care Procedure:                Pre-Anesthesia Assessment:                           - Prior to the procedure, a History and Physical                            was performed, and patient medications and                            allergies were reviewed. The patient's tolerance of                            previous anesthesia was also reviewed. The risks                            and benefits of the procedure and the sedation                            options and risks were discussed with the patient.                            All questions were answered, and informed consent                            was obtained. Prior Anticoagulants: The patient has                            taken no anticoagulant or antiplatelet agents. ASA                            Grade Assessment: II - A patient with mild systemic                            disease. After reviewing the risks and benefits,                            the patient was deemed in satisfactory condition to                            undergo the procedure.  After obtaining informed consent, the colonoscope                            was passed under direct vision. Throughout the                            procedure, the patient's blood pressure, pulse, and                            oxygen saturations were monitored continuously. The                            Olympus Scope SN: [id number] was introduced through                            the anus  and advanced to the the terminal ileum.                            The colonoscopy was performed without difficulty.                            The patient tolerated the procedure well. The                            quality of the bowel preparation was good. The                            terminal ileum, ileocecal valve, appendiceal                            orifice, and rectum were photographed. Scope In: 9:39:15 AM Scope Out: 9:58:52 AM Scope Withdrawal Time: 0 hours 16 minutes 3 seconds  Total Procedure Duration: 0 hours 19 minutes 37 seconds  Findings:                 The terminal ileum appeared normal.                           Four sessile polyps were found in the ascending                            colon and cecum. The polyps were 3 to 6 mm in size.                            These polyps were removed with a cold snare.                            Resection and retrieval were complete.                           Multiple diverticula were found in the sigmoid                            colon.  A 3 mm polyp was found in the sigmoid colon. The                            polyp was sessile. The polyp was removed with a                            cold snare. Resection and retrieval were complete.                           Non-bleeding external and internal hemorrhoids were                            found during retroflexion and during perianal exam. Complications:            No immediate complications. Estimated Blood Loss:     Estimated blood loss was minimal. Impression:               - The examined portion of the ileum was normal.                           - Four 3 to 6 mm polyps in the ascending colon and                            in the cecum, removed with a cold snare. Resected                            and retrieved.                           - Diverticulosis in the sigmoid colon.                           - One 3 mm polyp in the sigmoid colon,  removed with                            a cold snare. Resected and retrieved.                           - Non-bleeding external and internal hemorrhoids. Recommendation:           - Discharge patient to home (with escort).                           - Await pathology results.                           - The findings and recommendations were discussed                            with the patient. Dr Particia Lather "Alan Ripper" Leonides Schanz,  05/08/2023 10:05:35 AM

## 2023-05-09 ENCOUNTER — Telehealth: Payer: Self-pay

## 2023-05-09 NOTE — Telephone Encounter (Signed)
  Follow up Call-     05/08/2023    8:19 AM 06/16/2021   10:19 AM  Call back number  Post procedure Call Back phone  # [phone]  Permission to leave phone message Yes Yes     Patient questions:  Do you have a fever, pain , or abdominal swelling? No. Pain Score  0 *  Have you tolerated food without any problems? Yes.    Have you been able to return to your normal activities? Yes.    Do you have any questions about your discharge instructions: Diet   No. Medications  No. Follow up visit  No.  Do you have questions or concerns about your Care? No.  Actions: * If pain score is 4 or above: No action needed, pain <4.

## 2023-05-10 ENCOUNTER — Telehealth: Payer: Self-pay | Admitting: Internal Medicine

## 2023-05-10 NOTE — Telephone Encounter (Signed)
Advised the patient to contact her PCP for appropriate intervention.

## 2023-05-10 NOTE — Telephone Encounter (Signed)
Inbound call from patient requesting to speak with a nurse . She said she had a procedure 7/9 and now she is feeling a "burning sensation " when she goes to the bathroom ,she thinks she has a UTI  and she dont know if he was because of the procedure.Please advise

## 2023-05-11 ENCOUNTER — Encounter: Payer: Self-pay | Admitting: Internal Medicine

## 2023-05-15 ENCOUNTER — Encounter: Payer: Self-pay | Admitting: Family

## 2023-05-15 ENCOUNTER — Ambulatory Visit: Payer: Medicare PPO | Admitting: Family

## 2023-05-15 VITALS — BP 140/80 | HR 81 | Temp 97.3°F | Ht 65.0 in | Wt 183.5 lb

## 2023-05-15 DIAGNOSIS — R310 Gross hematuria: Secondary | ICD-10-CM | POA: Diagnosis not present

## 2023-05-15 DIAGNOSIS — N3001 Acute cystitis with hematuria: Secondary | ICD-10-CM | POA: Diagnosis not present

## 2023-05-15 DIAGNOSIS — R350 Frequency of micturition: Secondary | ICD-10-CM | POA: Diagnosis not present

## 2023-05-15 DIAGNOSIS — Z86010 Personal history of colonic polyps: Secondary | ICD-10-CM | POA: Insufficient documentation

## 2023-05-15 LAB — POC URINALSYSI DIPSTICK (AUTOMATED)
Bilirubin, UA: NEGATIVE
Glucose, UA: NEGATIVE
Ketones, UA: NEGATIVE
Nitrite, UA: NEGATIVE
Protein, UA: POSITIVE — AB
Spec Grav, UA: 1.025 (ref 1.010–1.025)
Urobilinogen, UA: 0.2 E.U./dL
pH, UA: 6 (ref 5.0–8.0)

## 2023-05-15 MED ORDER — NITROFURANTOIN MONOHYD MACRO 100 MG PO CAPS
100.0000 mg | ORAL_CAPSULE | Freq: Two times a day (BID) | ORAL | 0 refills | Status: AC
Start: 2023-05-15 — End: 2023-05-22

## 2023-05-15 NOTE — Assessment & Plan Note (Signed)
Urine dip today in office with hematuria, leukocytes pt symptomatic will choose to treat rx nitrofurantoin 100 mg po bix x 7 days Kidney function > 60 Pending urinalysis with culture to see if different antbx needed.

## 2023-05-15 NOTE — Progress Notes (Signed)
Established Patient Office Visit  Subjective:   Patient ID: Nicole Daniels, female    DOB: [birth year]  Age: 72 y.o. MRN: [id number]  CC:  Chief Complaint  Patient presents with   Urinary Frequency    HPI: Nicole Daniels is a 72 y.o. female presenting on 05/15/2023 for Urinary Frequency  About five days ago, 2 days after colonoscopy, states that she started with urinary frequency and dysuria. Getting up 20-3 times in the night to pee as well with urgency.  No fever chills or back pain. She does have gross hematuria  No vaginal discharge.   Does see urologist, has f/u appt in November .           ROS: Negative unless specifically indicated above in HPI.   Relevant past medical history reviewed and updated as indicated.   Allergies and medications reviewed and updated.   Current Outpatient Medications:    acetaminophen (TYLENOL) 325 MG tablet, Take 650 mg by mouth every 6 (six) hours as needed., Disp: , Rfl:    Cholecalciferol (VITAMIN D3) 2000 units TABS, Take 2,000 Units by mouth daily., Disp: , Rfl:    Cyanocobalamin (VITAMIN B 12 PO), Take by mouth., Disp: , Rfl:    ezetimibe (ZETIA) 10 MG tablet, Take 1 tablet (10 mg total) by mouth daily., Disp: 90 tablet, Rfl: 3   fluticasone (FLONASE) 50 MCG/ACT nasal spray, Place 1 spray into both nostrils as needed for allergies. , Disp: , Rfl:    hydrocortisone (ANUSOL-HC) 2.5 % rectal cream, Place 1 Application rectally 2 (two) times daily., Disp: 30 g, Rfl: 0   levothyroxine (SYNTHROID) 75 MCG tablet, TAKE 1 TABLET BY MOUTH EVERY DAY, Disp: 90 tablet, Rfl: 1   meclizine (ANTIVERT) 12.5 MG tablet, Take 1 tablet (12.5 mg total) by mouth 3 (three) times daily as needed for dizziness., Disp: 30 tablet, Rfl: 0   nitrofurantoin, macrocrystal-monohydrate, (MACROBID) 100 MG capsule, Take 1 capsule (100 mg total) by mouth 2 (two) times daily for 7 days., Disp: 14 capsule, Rfl: 0   OVER THE COUNTER MEDICATION, Sinus and pain,  take PRN., Disp: , Rfl:    OVER THE COUNTER MEDICATION, Pro-biotic one capsule daily., Disp: , Rfl:   Allergies  Allergen Reactions   Simvastatin Other (See Comments)    MYALGIAS   Statins Other (See Comments)    Has severe leg muscle aches and cramps   Bactrim [Sulfamethoxazole-Trimethoprim] Itching   Sertraline Anxiety    Objective:   BP (!) 140/80 (BP Location: Left Arm, Patient Position: Sitting, Cuff Size: Large)   Pulse 81   Temp (!) 97.3 F (36.3 C) (Temporal)   Ht 5\' 5"  (1.651 m)   Wt 183 lb 8 oz (83.2 kg)   SpO2 96%   BMI 30.54 kg/m    Physical Exam Constitutional:      General: She is not in acute distress.    Appearance: Normal appearance. She is normal weight. She is not ill-appearing, toxic-appearing or diaphoretic.  Cardiovascular:     Rate and Rhythm: Normal rate.  Pulmonary:     Effort: Pulmonary effort is normal.  Abdominal:     General: Abdomen is flat.     Tenderness: There is abdominal tenderness (rlq abdominal pain). There is no right CVA tenderness or left CVA tenderness.  Neurological:     General: No focal deficit present.     Mental Status: She is alert and oriented to person, place, and time. Mental status is at baseline.  Motor: No weakness.  Psychiatric:        Mood and Affect: Mood normal.        Behavior: Behavior normal.        Thought Content: Thought content normal.        Judgment: Judgment normal.    Lab Results  Component Value Date   WBC 5.4 03/21/2023   HGB 14.5 03/21/2023   HCT 44.4 03/21/2023   MCV 92.5 03/21/2023   PLT 410.0 (H) 03/21/2023   Last metabolic panel Lab Results  Component Value Date   GLUCOSE 99 03/21/2023   NA 141 03/21/2023   K 4.4 03/21/2023   CL 103 03/21/2023   CO2 29 03/21/2023   BUN 12 03/21/2023   CREATININE 0.76 03/21/2023   GFR 77.94 03/21/2023   CALCIUM 9.8 03/21/2023   PROT 7.3 05/29/2022   ALBUMIN 4.2 05/29/2022   BILITOT 0.6 05/29/2022   ALKPHOS 84 05/29/2022   AST 16  05/29/2022   ALT 13 05/29/2022   ANIONGAP 11 02/22/2022     Assessment & Plan:  Urinary frequency -     POCT Urinalysis Dipstick (Automated) -     Urinalysis w microscopic + reflex cultur  Gross hematuria -     Urinalysis w microscopic + reflex cultur  Acute cystitis with hematuria Assessment & Plan: Urine dip today in office with hematuria, leukocytes pt symptomatic will choose to treat rx nitrofurantoin 100 mg po bix x 7 days Kidney function > 60 Pending urinalysis with culture to see if different antbx needed.    Orders: -     Nitrofurantoin Monohyd Macro; Take 1 capsule (100 mg total) by mouth 2 (two) times daily for 7 days.  Dispense: 14 capsule; Refill: 0  History of colon polyps     Follow up plan: Return if symptoms worsen or fail to improve.  Mort Sawyers, FNP

## 2023-05-17 LAB — URINALYSIS W MICROSCOPIC + REFLEX CULTURE
Bacteria, UA: NONE SEEN /HPF
Bilirubin Urine: NEGATIVE
Glucose, UA: NEGATIVE
Hyaline Cast: NONE SEEN /LPF
Ketones, ur: NEGATIVE
Nitrites, Initial: POSITIVE — AB
RBC / HPF: >=60 /HPF — AB (ref 0–2)
Specific Gravity, Urine: 1.019 (ref 1.001–1.035)
WBC, UA: >=60 /HPF — AB (ref 0–5)
pH: 6 (ref 5.0–8.0)

## 2023-05-17 LAB — CULTURE INDICATED

## 2023-05-17 LAB — URINE CULTURE
MICRO NUMBER:: 15210195
SPECIMEN QUALITY:: ADEQUATE

## 2023-05-18 ENCOUNTER — Other Ambulatory Visit: Payer: Self-pay | Admitting: Family

## 2023-05-18 DIAGNOSIS — R310 Gross hematuria: Secondary | ICD-10-CM

## 2023-05-23 ENCOUNTER — Other Ambulatory Visit: Payer: Self-pay | Admitting: Family

## 2023-05-23 DIAGNOSIS — T466X5A Adverse effect of antihyperlipidemic and antiarteriosclerotic drugs, initial encounter: Secondary | ICD-10-CM

## 2023-05-23 DIAGNOSIS — E782 Mixed hyperlipidemia: Secondary | ICD-10-CM

## 2023-05-31 ENCOUNTER — Telehealth: Payer: Self-pay

## 2023-05-31 NOTE — Patient Outreach (Signed)
  Care Coordination   Initial Visit Note   05/31/2023 Name: Nicole Daniels MRN: [id number] DOB: 09-04-1951  Nicole Daniels is a 72 y.o. year old female who sees Mort Sawyers, FNP for primary care. I spoke with  Oletta Darter by phone today.  What matters to the patients health and wellness today?  Patient request a return call from [facility] to discuss care coordination services further.     Goals Addressed             This Visit's Progress    Care coordination activities       Interventions Today    Flowsheet Row Most Recent Value  General Interventions   General Interventions Discussed/Reviewed General Interventions Discussed  [Care coordination services discussed. . AWV discussed and patient advised to contact provider office to schedule.]              SDOH assessments and interventions completed:  No     Care Coordination Interventions:  Yes, provided   Follow up plan: Follow up call scheduled for 06/07/23    Encounter Outcome:  Pt. Visit Completed   George Ina RN,BSN,CCM [facility] Care Coordination [phone] direct line

## 2023-06-07 ENCOUNTER — Ambulatory Visit: Payer: Medicare PPO | Admitting: Primary Care

## 2023-06-07 ENCOUNTER — Encounter: Payer: Self-pay | Admitting: Primary Care

## 2023-06-07 ENCOUNTER — Ambulatory Visit: Payer: Self-pay

## 2023-06-07 VITALS — BP 134/62 | HR 94 | Temp 99.4°F | Ht 65.0 in | Wt 183.0 lb

## 2023-06-07 DIAGNOSIS — R051 Acute cough: Secondary | ICD-10-CM

## 2023-06-07 DIAGNOSIS — U071 COVID-19: Secondary | ICD-10-CM | POA: Insufficient documentation

## 2023-06-07 LAB — POC COVID19 BINAXNOW: SARS Coronavirus 2 Ag: POSITIVE — AB

## 2023-06-07 MED ORDER — NIRMATRELVIR/RITONAVIR (PAXLOVID)TABLET
3.0000 | ORAL_TABLET | Freq: Two times a day (BID) | ORAL | 0 refills | Status: AC
Start: 2023-06-07 — End: 2023-06-12

## 2023-06-07 NOTE — Assessment & Plan Note (Signed)
Symptoms today representative of viral etiology.  COVID-19 test positive in the clinic today.  Vitals are overall stable. Exam was overall reassuring.  She qualifies for antiviral treatment given her medical history.  GFR was 77 in May 2024.  She is not on statin or anticoagulant therapy. Start Paxlovid antiviral treatment.  Take 3 pills by mouth twice daily for 5 days.  We discussed quarantine precautions and return precautions. Offered Lawyer for cough, she kindly declines but will notify if she changes her mind.  Follow-up as needed.

## 2023-06-07 NOTE — Patient Instructions (Signed)
Start Paxlovid medication for COVID-19 infection.  Take 3 capsules by mouth twice daily for 5 days.  Continue Tylenol/ibuprofen as needed.  It was a pleasure meeting you!

## 2023-06-07 NOTE — Patient Outreach (Signed)
  Care Coordination   Initial Visit Note   06/07/2023 Name: Nicole Daniels MRN: [id number] DOB: [birth year]  Nicole Daniels is a 72 y.o. year old female who sees Mort Sawyers, FNP for primary care. I spoke with  Oletta Darter by phone today.  What matters to the patients health and wellness today?  Patient denies having any nursing and/ or community resource needs.     Goals Addressed             This Visit's Progress    COMPLETED: Care coordination activities- no follow up needed       Interventions Today    Flowsheet Row Most Recent Value  General Interventions   General Interventions Discussed/Reviewed General Interventions Discussed  [Care coordination services discussed. SDOH survey completed. AWV discussed and patient advised to contact provider office to schedule. Discussed vaccines. Advised to contact PCP office if care coordination services needed in the future.]              SDOH assessments and interventions completed:  Yes  SDOH Interventions Today    Flowsheet Row Most Recent Value  SDOH Interventions   Food Insecurity Interventions Intervention Not Indicated  Housing Interventions Intervention Not Indicated  Transportation Interventions Intervention Not Indicated        Care Coordination Interventions:  Yes, provided   Follow up plan: No further intervention required.   Encounter Outcome:  Pt. Visit Completed   George Ina RN,BSN,CCM [facility] Care Coordination [phone] direct line

## 2023-06-07 NOTE — Progress Notes (Signed)
Subjective:    Patient ID: Nicole Daniels, female    DOB: 1951-05-15, 72 y.o.   MRN: [id number]  Sinus Problem Associated symptoms include congestion, coughing, headaches, shortness of breath and sinus pressure.    Nicole Daniels is a very pleasant 72 y.o. female patient of Tabitha, NP with a history of recurrent sinusitis, BPPV who presents today to discuss acute cough.  Symptom onset two days ago with body aches, lightheadedness. She then developed maxillary and frontal sinus pressure, headaches, nasal congestion, and cough.   She does have intermittent chest tightness and shortness of breath.   She's been taking OTC "Sinus Congestion and Pain Relief.   She has not taken a Covid-19 test. Her sister called her today who told her that she tested positive for Covid-19 infection.   She is a non smoker and has no history of asthma.     Review of Systems  Constitutional:  Positive for fatigue and fever.  HENT:  Positive for congestion, postnasal drip, rhinorrhea and sinus pressure.   Respiratory:  Positive for cough, chest tightness and shortness of breath.   Neurological:  Positive for headaches.         Past Medical History:  Diagnosis Date   Abdominal pain, right lower quadrant    Acquired cyst of kidney 01/08/2012   pt denies history of    Acute sinusitis, unspecified    Adenomatous colon polyp    Allergy    Angiomyolipoma    kidney   Anxiety    OCCASIONAL   Cataract    Chest pain    Chest pain, unspecified    Depressive disorder, not elsewhere classified 01/08/2012   not currently    Diverticulosis    Dizziness and giddiness    Dysfunction of eustachian tube    H/O Spinal surgery 02/20/2017   Hemorrhoids    History of diverticulitis of colon    History of fusion of cervical spine 04/25/2018   IBS (irritable bowel syndrome)    Liver lesion 2011   Neoplasm of skin of face    malignant   OA (osteoarthritis)    knee, NECK   Obesity, unspecified     Ostium secundum type atrial septal defect    Other and unspecified hyperlipidemia    Other malaise and fatigue    Palpitations    Shortness of breath    Thyroid disease    Unspecified essential hypertension    pt states she does not have HTN   Unspecified sinusitis (chronic)    Unspecified vitamin D deficiency     Social History   Socioeconomic History   Marital status: Widowed    Spouse name: Not on file   Number of children: 2   Years of education: Not on file   Highest education level: 12th grade  Occupational History   Occupation: retired  Tobacco Use   Smoking status: Never   Smokeless tobacco: Never  Vaping Use   Vaping status: Never Used  Substance and Sexual Activity   Alcohol use: No   Drug use: No   Sexual activity: Yes    Partners: Male  Other Topics Concern   Not on file  Social History Narrative   Daily caffeine    Social Determinants of Health   Financial Resource Strain: Low Risk  (02/28/2023)   Overall Financial Resource Strain (CARDIA)    Difficulty of Paying Living Expenses: Not hard at all  Food Insecurity: No Food Insecurity (06/07/2023)   Hunger Vital  Sign    Worried About Programme researcher, broadcasting/film/video in the Last Year: Never true    Ran Out of Food in the Last Year: Never true  Transportation Needs: No Transportation Needs (06/07/2023)   PRAPARE - Administrator, Civil Service (Medical): No    Lack of Transportation (Non-Medical): No  Physical Activity: Insufficiently Active (02/28/2023)   Exercise Vital Sign    Days of Exercise per Week: 3 days    Minutes of Exercise per Session: 20 min  Stress: No Stress Concern Present (02/28/2023)   [employer] of Occupational Health - Occupational Stress Questionnaire    Feeling of Stress : Not at all  Social Connections: Moderately Integrated (02/28/2023)   Social Connection and Isolation Panel [NHANES]    Frequency of Communication with Friends and Family: More than three times a week    Frequency  of Social Gatherings with Friends and Family: Twice a week    Attends Religious Services: More than 4 times per year    Active Member of [employer] or Organizations: Yes    Attends Banker Meetings: More than 4 times per year    Marital Status: Widowed  Intimate Partner Violence: Not At Risk (08/16/2022)   Humiliation, Afraid, Rape, and Kick questionnaire    Fear of Current or Ex-Partner: No    Emotionally Abused: No    Physically Abused: No    Sexually Abused: No    Past Surgical History:  Procedure Laterality Date   ABDOMINAL HYSTERECTOMY     BREAST REDUCTION SURGERY  1990   CERVICAL DISC SURGERY     Dr.Nudleman   CHOLECYSTECTOMY     COLONOSCOPY     DILATION AND CURETTAGE OF UTERUS     EYE SURGERY Bilateral 07/30/2017   cataract sx   FLEXIBLE SIGMOIDOSCOPY  01/08/2012   Procedure: FLEXIBLE SIGMOIDOSCOPY;  Surgeon: Hart Carwin, MD;  Location: [facility];  Service: Endoscopy;  Laterality: N/A;   HEMORRHOID SURGERY     KNEE ARTHROTOMY     pt states no    LAMINECTOMY N/A 01/31/2017   Procedure: Thoracic Laminectomy and marsupialization of arachnoid cyst;  Surgeon: Shirlean Kelly, MD;  Location: [facility] OR;  Service: Neurosurgery;  Laterality: N/A;   LEFT OOPHORECTOMY  1976   NECK SURGERY N/A 03/2018   PARTIAL HYSTERECTOMY     right ovary remains   REDUCTION MAMMAPLASTY     TONSILLECTOMY  1972   UPPER GASTROINTESTINAL ENDOSCOPY      Family History  Problem Relation Age of Onset   Hypertension Mother    Diabetes Mother    Cirrhosis Father    Heart disease Father    Hypertension Sister    Diabetes Sister    Heart disease Brother        x 2   Breast cancer Cousin    Diabetes Cousin    Diabetes Other        aunt   Stroke Other        aunt   Colon cancer Neg Hx    Esophageal cancer Neg Hx    Stomach cancer Neg Hx    Rectal cancer Neg Hx     Allergies  Allergen Reactions   Simvastatin Other (See Comments)    MYALGIAS   Statins Other (See Comments)     Has severe leg muscle aches and cramps   Bactrim [Sulfamethoxazole-Trimethoprim] Itching   Sertraline Anxiety    Current Outpatient Medications on File Prior to Visit  Medication Sig  Dispense Refill   acetaminophen (TYLENOL) 325 MG tablet Take 650 mg by mouth every 6 (six) hours as needed.     Cholecalciferol (VITAMIN D3) 2000 units TABS Take 2,000 Units by mouth daily.     Cyanocobalamin (VITAMIN B 12 PO) Take by mouth.     ezetimibe (ZETIA) 10 MG tablet TAKE 1 TABLET BY MOUTH EVERY DAY 90 tablet 3   fluticasone (FLONASE) 50 MCG/ACT nasal spray Place 1 spray into both nostrils as needed for allergies.      hydrocortisone (ANUSOL-HC) 2.5 % rectal cream Place 1 Application rectally 2 (two) times daily. 30 g 0   levothyroxine (SYNTHROID) 75 MCG tablet TAKE 1 TABLET BY MOUTH EVERY DAY 90 tablet 1   meclizine (ANTIVERT) 12.5 MG tablet Take 1 tablet (12.5 mg total) by mouth 3 (three) times daily as needed for dizziness. 30 tablet 0   OVER THE COUNTER MEDICATION Sinus and pain, take PRN.     OVER THE COUNTER MEDICATION Pro-biotic one capsule daily.     No current facility-administered medications on file prior to visit.    BP 134/62   Pulse 94   Temp 99.4 F (37.4 C) (Temporal)   Ht 5\' 5"  (1.651 m)   Wt 183 lb (83 kg)   SpO2 98%   BMI 30.45 kg/m  Objective:   Physical Exam Constitutional:      Appearance: She is ill-appearing.  HENT:     Right Ear: Tympanic membrane and ear canal normal.     Left Ear: Tympanic membrane and ear canal normal.     Nose:     Right Sinus: No maxillary sinus tenderness or frontal sinus tenderness.     Left Sinus: No maxillary sinus tenderness or frontal sinus tenderness.     Mouth/Throat:     Pharynx: No posterior oropharyngeal erythema.  Eyes:     Conjunctiva/sclera: Conjunctivae normal.  Cardiovascular:     Rate and Rhythm: Normal rate and regular rhythm.  Pulmonary:     Effort: Pulmonary effort is normal.     Breath sounds: Examination of the  right-lower field reveals rhonchi. Rhonchi present. No wheezing or rales.     Comments: Congested cough noted a few times during visit Musculoskeletal:     Cervical back: Neck supple.  Lymphadenopathy:     Cervical: No cervical adenopathy.  Skin:    General: Skin is warm and dry.           Assessment & Plan:  COVID-19 virus infection Assessment & Plan: Symptoms today representative of viral etiology.  COVID-19 test positive in the clinic today.  Vitals are overall stable. Exam was overall reassuring.  She qualifies for antiviral treatment given her medical history.  GFR was 77 in May 2024.  She is not on statin or anticoagulant therapy. Start Paxlovid antiviral treatment.  Take 3 pills by mouth twice daily for 5 days.  We discussed quarantine precautions and return precautions. Offered Lawyer for cough, she kindly declines but will notify if she changes her mind.  Follow-up as needed.  Orders: -     nirmatrelvir/ritonavir; Take 3 tablets by mouth 2 (two) times daily for 5 days.  Dispense: 30 tablet; Refill: 0  Acute cough -     POC COVID-19 BinaxNow        Doreene Nest, NP

## 2023-06-11 ENCOUNTER — Other Ambulatory Visit: Payer: Self-pay | Admitting: Family

## 2023-06-11 DIAGNOSIS — E039 Hypothyroidism, unspecified: Secondary | ICD-10-CM

## 2023-06-25 DIAGNOSIS — J3489 Other specified disorders of nose and nasal sinuses: Secondary | ICD-10-CM | POA: Diagnosis not present

## 2023-06-25 DIAGNOSIS — J0191 Acute recurrent sinusitis, unspecified: Secondary | ICD-10-CM | POA: Diagnosis not present

## 2023-07-11 DIAGNOSIS — J0191 Acute recurrent sinusitis, unspecified: Secondary | ICD-10-CM | POA: Diagnosis not present

## 2023-07-19 ENCOUNTER — Ambulatory Visit (HOSPITAL_COMMUNITY)
Admission: RE | Admit: 2023-07-19 | Discharge: 2023-07-19 | Disposition: A | Discharge status: Home / Self Care | Payer: Medicare PPO | Source: Ambulatory Visit | Attending: Internal Medicine | Admitting: Internal Medicine

## 2023-07-19 ENCOUNTER — Telehealth: Payer: Self-pay | Admitting: Internal Medicine

## 2023-07-19 ENCOUNTER — Other Ambulatory Visit (INDEPENDENT_AMBULATORY_CARE_PROVIDER_SITE_OTHER): Payer: Medicare PPO

## 2023-07-19 ENCOUNTER — Other Ambulatory Visit: Payer: Self-pay

## 2023-07-19 DIAGNOSIS — R1032 Left lower quadrant pain: Secondary | ICD-10-CM

## 2023-07-19 DIAGNOSIS — R1030 Lower abdominal pain, unspecified: Secondary | ICD-10-CM | POA: Insufficient documentation

## 2023-07-19 DIAGNOSIS — N281 Cyst of kidney, acquired: Secondary | ICD-10-CM | POA: Diagnosis not present

## 2023-07-19 DIAGNOSIS — K921 Melena: Secondary | ICD-10-CM | POA: Diagnosis not present

## 2023-07-19 DIAGNOSIS — K5730 Diverticulosis of large intestine without perforation or abscess without bleeding: Secondary | ICD-10-CM | POA: Diagnosis not present

## 2023-07-19 LAB — BASIC METABOLIC PANEL
BUN: 12 mg/dL (ref 6–23)
CO2: 31 mEq/L (ref 19–32)
Calcium: 9.6 mg/dL (ref 8.4–10.5)
Chloride: 103 mEq/L (ref 96–112)
Creatinine, Ser: 0.83 mg/dL (ref 0.40–1.20)
GFR: 69.95 mL/min (ref >60.00)
Glucose, Bld: 101 mg/dL — ABNORMAL HIGH (ref 70–99)
Potassium: 4 mEq/L (ref 3.5–5.1)
Sodium: 142 mEq/L (ref 135–145)

## 2023-07-19 LAB — CBC WITH DIFFERENTIAL/PLATELET
Basophils Absolute: 0 10*3/uL (ref 0.0–0.1)
Basophils Relative: 0.5 % (ref 0.0–3.0)
Eosinophils Absolute: 0.2 10*3/uL (ref 0.0–0.7)
Eosinophils Relative: 2.9 % (ref 0.0–5.0)
HCT: 43.6 % (ref 36.0–46.0)
Hemoglobin: 14.5 g/dL (ref 12.0–15.0)
Lymphocytes Relative: 31 % (ref 12.0–46.0)
Lymphs Abs: 1.9 10*3/uL (ref 0.7–4.0)
MCHC: 33.3 g/dL (ref 30.0–36.0)
MCV: 90.6 fl (ref 78.0–100.0)
Monocytes Absolute: 0.6 10*3/uL (ref 0.1–1.0)
Monocytes Relative: 10.2 % (ref 3.0–12.0)
Neutro Abs: 3.4 10*3/uL (ref 1.4–7.7)
Neutrophils Relative %: 55.4 % (ref 43.0–77.0)
Platelets: 397 10*3/uL (ref 150.0–400.0)
RBC: 4.81 Mil/uL (ref 3.87–5.11)
RDW: 13.4 % (ref 11.5–15.5)
WBC: 6.2 10*3/uL (ref 4.0–10.5)

## 2023-07-19 MED ORDER — IOHEXOL 9 MG/ML PO SOLN
1000.0000 mL | Freq: Once | ORAL | Status: AC
  Administered 2023-07-19: 1000 mL via ORAL

## 2023-07-19 MED ORDER — IOHEXOL 300 MG/ML  SOLN
100.0000 mL | Freq: Once | INTRAMUSCULAR | Status: AC | PRN
  Administered 2023-07-19: 100 mL via INTRAVENOUS

## 2023-07-19 NOTE — Telephone Encounter (Signed)
Inbound call from patient, states she has been having "gel" like bloody bowel movements. Patient states it has been going on for a week and would like to discuss with a nurse if that is something she should be concerned about. Please advise.

## 2023-07-19 NOTE — Telephone Encounter (Signed)
Patient agrees to the plan of care. Stat CT abd/pelvis ordered. Radiology scheduling notified. The patient will come now for bmet and CBC.

## 2023-07-19 NOTE — Telephone Encounter (Signed)
Started a little over a week ago. She had been on Augmentin for sinusitis. Completed Augmentin. There was diarrhea while on the antibiotic.  She developed a little pain in the lower abdomen mostly middle to left sided. She is having 2 to 3 bowel movements per day that are soft, "normal" colored and have a "mucous gel bloody stuff" in them. She does not taken any fiber supplements. She does not have rectal pain.

## 2023-07-20 ENCOUNTER — Other Ambulatory Visit: Payer: Self-pay | Admitting: Internal Medicine

## 2023-07-31 NOTE — Telephone Encounter (Signed)
PT had a CT and since having it she is still experiencing pain and a red jelly like substance in BM. She is also having more BM as well. Please advise.

## 2023-08-01 NOTE — Telephone Encounter (Signed)
Called the patient. No answer. Left message of my call.

## 2023-08-01 NOTE — Telephone Encounter (Signed)
PT is returning call. Please advise.  

## 2023-08-02 NOTE — Telephone Encounter (Signed)
Spoke with the patient. She has begun having diarrhea with everything she eats. She has not had a formed stool "in days." She continues to see the "red jelly" stools.  Agrees to come in for evaluation. Appointment with [place], PA 08/03/23 at 11:30 am.

## 2023-08-03 ENCOUNTER — Ambulatory Visit: Payer: Medicare PPO | Admitting: Gastroenterology

## 2023-08-03 ENCOUNTER — Encounter: Payer: Self-pay | Admitting: Gastroenterology

## 2023-08-03 VITALS — Ht 65.0 in | Wt 182.0 lb

## 2023-08-03 DIAGNOSIS — R197 Diarrhea, unspecified: Secondary | ICD-10-CM | POA: Diagnosis not present

## 2023-08-03 DIAGNOSIS — K649 Unspecified hemorrhoids: Secondary | ICD-10-CM | POA: Diagnosis not present

## 2023-08-03 DIAGNOSIS — K625 Hemorrhage of anus and rectum: Secondary | ICD-10-CM

## 2023-08-03 DIAGNOSIS — R1030 Lower abdominal pain, unspecified: Secondary | ICD-10-CM

## 2023-08-03 MED ORDER — HYDROCORTISONE ACETATE 25 MG RE SUPP: 25.0000 mg | Freq: Two times a day (BID) | RECTAL | 0 refills | Status: DC

## 2023-08-03 NOTE — Patient Instructions (Addendum)
_______________________________________________________  If your blood pressure at your visit was 140/90 or greater, please contact your primary care physician to follow up on this.  _______________________________________________________  If you are age 73 or older, your body mass index should be between 23-30. Your Body mass index is 30.29 kg/m. If this is out of the aforementioned range listed, please consider follow up with your Primary Care Provider.  If you are age 31 or younger, your body mass index should be between 19-25. Your Body mass index is 30.29 kg/m. If this is out of the aformentioned range listed, please consider follow up with your Primary Care Provider.   ________________________________________________________  The [facility] providers would like to encourage you to use [facility] to communicate with providers for non-urgent requests or questions.  Due to long hold times on the telephone, sending your provider a message by [facility] may be a faster and more efficient way to get a response.  Please allow 48 business hours for a response.  Please remember that this is for non-urgent requests.  _______________________________________________________  Please take your specimen to the lab.  Your provider has requested that you go to the basement level today. Press "B" on the elevator. The lab is located at the first door on the left as you exit the elevator.  Please purchase the following medications over the counter and take as directed: Benefiber daily  We have sent the following medications to your pharmacy for you to pick up at your convenience: Anusol supp   Please call with any questions or concerns.  It was a pleasure to see you today!  Thank you for trusting me with your gastrointestinal care!

## 2023-08-03 NOTE — Progress Notes (Signed)
Chief Complaint: Diarrhea Primary GI MD: Dr. Leonides Schanz  HPI: 72 year old female history of hypothyroidism, BPPV, PFO, tubular adenomas, presents for evaluation of diarrhea  Patient states in August she had a sinus infection and was given a 14-day course of Augmentin.  Midway through the course of Augmentin she developed diarrhea and associated with her diarrhea she noticed a "red jelly" substance that would come out in the stool and on the tissue paper.  She does have history of internal hemorrhoids and has had banding in the past.  She came in for lab work 07/19/2023 with normal CBC and CMP.  Since she was having LLQ pain CT abdomen pelvis with contrast 07/19/2023 was obtained and showed diverticulosis without acute diverticulitis.  She continues to have 3-4 stools per day.  Occasional fecal incontinence with urinating.  Stools vary from watery to "sand" to sometimes pebbles.  She has associated LLQ pain that is relieved with a bowel movement.  She typically will see this "red jelly substance" with each bowel movement.  Reports some nausea, no vomiting.  Denies unintentional weight loss.  PREVIOUS GI WORKUP   Colonoscopy 05/08/2023 for personal history of colon polyps and positive fit test - The examined portion of the ileum was normal.  - Four 3 to 6 mm polyps (tubular adenomas) in the ascending colon and in the cecum, removed with a cold snare. Resected and retrieved.  - Diverticulosis in the sigmoid colon. - One 3 mm polyp (polyploid benign mucosa) in the sigmoid colon, removed with a cold snare. Resected and retrieved. - Non- bleeding external and internal hemorrhoids.  Colonoscopy 06/16/2021 with Dr. Orvan Falconer for personal history of colon polyps - Hemorrhoids found on perianal exam.  - Non- bleeding internal hemorrhoids.  - Diverticulosis in the sigmoid colon and in the descending colon.  - One less than 1 mm polyp in the descending colon, removed with a cold biopsy forceps. Resected and  retrieved.  - Three 2 to 3 mm polyps in the ascending colon, removed with a cold snare. Resected and retrieved.  - Two 1 to 2 mm polyps in the cecum, removed with a cold snare. Resected and retrieved.  - The examination was otherwise normal on direct and retroflexion views. - repeat 3 years Diagnosis 1. Surgical [P], colon, cecum, polyp (2) - TUBULAR ADENOMA WITHOUT HIGH-GRADE DYSPLASIA OR MALIGNANCY - OTHER FRAGMENT OF POLYPOID COLONIC MUCOSA WITH PROMINENT LYMPHOID AGGREGATES 2. Surgical [P], colon, ascending, polyp (3) - TUBULAR ADENOMA(S) WITHOUT HIGH-GRADE DYSPLASIA OR MALIGNANCY 3. Surgical [P], colon, descending, polyp (1) - BENIGN LEIOMYOMA  Past Medical History:  Diagnosis Date   Abdominal pain, right lower quadrant    Acquired cyst of kidney 01/08/2012   pt denies history of    Acute sinusitis, unspecified    Adenomatous colon polyp    Allergy    Angiomyolipoma    kidney   Anxiety    OCCASIONAL   Cataract    Chest pain    Chest pain, unspecified    Depressive disorder, not elsewhere classified 01/08/2012   not currently    Diverticulosis    Dizziness and giddiness    Dysfunction of eustachian tube    H/O Spinal surgery 02/20/2017   Hemorrhoids    History of diverticulitis of colon    History of fusion of cervical spine 04/25/2018   IBS (irritable bowel syndrome)    Liver lesion 2011   Neoplasm of skin of face    malignant   OA (osteoarthritis)  knee, NECK   Obesity, unspecified    Ostium secundum type atrial septal defect    Other and unspecified hyperlipidemia    Other malaise and fatigue    Palpitations    Shortness of breath    Thyroid disease    Unspecified essential hypertension    pt states she does not have HTN   Unspecified sinusitis (chronic)    Unspecified vitamin D deficiency     Past Surgical History:  Procedure Laterality Date   ABDOMINAL HYSTERECTOMY     BREAST REDUCTION SURGERY  1990   CERVICAL DISC SURGERY     Dr.Nudleman    CHOLECYSTECTOMY     COLONOSCOPY     DILATION AND CURETTAGE OF UTERUS     EYE SURGERY Bilateral 07/30/2017   cataract sx   FLEXIBLE SIGMOIDOSCOPY  01/08/2012   Procedure: FLEXIBLE SIGMOIDOSCOPY;  Surgeon: Hart Carwin, MD;  Location: [facility];  Service: Endoscopy;  Laterality: N/A;   HEMORRHOID SURGERY     KNEE ARTHROTOMY     pt states no    LAMINECTOMY N/A 01/31/2017   Procedure: Thoracic Laminectomy and marsupialization of arachnoid cyst;  Surgeon: Shirlean Kelly, MD;  Location: [facility] OR;  Service: Neurosurgery;  Laterality: N/A;   LEFT OOPHORECTOMY  1976   NECK SURGERY N/A 03/2018   PARTIAL HYSTERECTOMY     right ovary remains   REDUCTION MAMMAPLASTY     TONSILLECTOMY  1972   UPPER GASTROINTESTINAL ENDOSCOPY      Current Outpatient Medications  Medication Sig Dispense Refill   acetaminophen (TYLENOL) 325 MG tablet Take 650 mg by mouth every 6 (six) hours as needed.     Cholecalciferol (VITAMIN D3) 2000 units TABS Take 2,000 Units by mouth daily.     Cyanocobalamin (VITAMIN B 12 PO) Take by mouth.     ezetimibe (ZETIA) 10 MG tablet TAKE 1 TABLET BY MOUTH EVERY DAY 90 tablet 3   fluticasone (FLONASE) 50 MCG/ACT nasal spray Place 1 spray into both nostrils as needed for allergies.      hydrocortisone (ANUSOL-HC) 2.5 % rectal cream Place 1 Application rectally 2 (two) times daily. 30 g 0   levothyroxine (SYNTHROID) 75 MCG tablet TAKE 1 TABLET BY MOUTH EVERY DAY 90 tablet 1   meclizine (ANTIVERT) 12.5 MG tablet Take 1 tablet (12.5 mg total) by mouth 3 (three) times daily as needed for dizziness. 30 tablet 0   OVER THE COUNTER MEDICATION Sinus and pain, take PRN.     OVER THE COUNTER MEDICATION Pro-biotic one capsule daily.     No current facility-administered medications for this visit.    Allergies as of 08/03/2023 - Review Complete 08/03/2023  Allergen Reaction Noted   Simvastatin Other (See Comments) 01/27/2011   Statins Other (See Comments) 05/14/2017   Bactrim  [sulfamethoxazole-trimethoprim] Itching 11/12/2013   Sertraline Anxiety 04/10/2019    Family History  Problem Relation Age of Onset   Hypertension Mother    Diabetes Mother    Cirrhosis Father    Heart disease Father    Hypertension Sister    Diabetes Sister    Heart disease Brother        x 2   Breast cancer Cousin    Diabetes Cousin    Diabetes Other        aunt   Stroke Other        aunt   Colon cancer Neg Hx    Esophageal cancer Neg Hx    Stomach cancer Neg Hx  Rectal cancer Neg Hx     Social History   Socioeconomic History   Marital status: Widowed    Spouse name: Not on file   Number of children: 2   Years of education: Not on file   Highest education level: 12th grade  Occupational History   Occupation: retired  Tobacco Use   Smoking status: Never   Smokeless tobacco: Never  Vaping Use   Vaping status: Never Used  Substance and Sexual Activity   Alcohol use: No   Drug use: No   Sexual activity: Yes    Partners: Male  Other Topics Concern   Not on file  Social History Narrative   Daily caffeine    Social Determinants of Health   Financial Resource Strain: Low Risk  (02/28/2023)   Overall Financial Resource Strain (CARDIA)    Difficulty of Paying Living Expenses: Not hard at all  Food Insecurity: No Food Insecurity (06/07/2023)   Hunger Vital Sign    Worried About Running Out of Food in the Last Year: Never true    Ran Out of Food in the Last Year: Never true  Transportation Needs: No Transportation Needs (06/07/2023)   PRAPARE - Administrator, Civil Service (Medical): No    Lack of Transportation (Non-Medical): No  Physical Activity: Insufficiently Active (02/28/2023)   Exercise Vital Sign    Days of Exercise per Week: 3 days    Minutes of Exercise per Session: 20 min  Stress: No Stress Concern Present (02/28/2023)   [employer] of Occupational Health - Occupational Stress Questionnaire    Feeling of Stress : Not at all  Social  Connections: Moderately Integrated (02/28/2023)   Social Connection and Isolation Panel [NHANES]    Frequency of Communication with Friends and Family: More than three times a week    Frequency of Social Gatherings with Friends and Family: Twice a week    Attends Religious Services: More than 4 times per year    Active Member of [employer] or Organizations: Yes    Attends Banker Meetings: More than 4 times per year    Marital Status: Widowed  Intimate Partner Violence: Not At Risk (08/16/2022)   Humiliation, Afraid, Rape, and Kick questionnaire    Fear of Current or Ex-Partner: No    Emotionally Abused: No    Physically Abused: No    Sexually Abused: No    Review of Systems:    Constitutional: No weight loss, fever, chills, weakness or fatigue HEENT: Eyes: No change in vision               Ears, Nose, Throat:  No change in hearing or congestion Skin: No rash or itching Cardiovascular: No chest pain, chest pressure or palpitations   Respiratory: No SOB or cough Gastrointestinal: See HPI and otherwise negative Genitourinary: No dysuria or change in urinary frequency Neurological: No headache, dizziness or syncope Musculoskeletal: No new muscle or joint pain Hematologic: No bleeding or bruising Psychiatric: No history of depression or anxiety    Physical Exam:  Vital signs: Ht 5\' 5"  (1.651 m)   Wt 82.6 kg   BMI 30.29 kg/m   Constitutional: NAD, Well developed, Well nourished, alert and cooperative Head:  Normocephalic and atraumatic. Eyes:   PEERL, EOMI. No icterus. Conjunctiva pink. Respiratory: Respirations even and unlabored. Lungs clear to auscultation bilaterally.   No wheezes, crackles, or rhonchi.  Cardiovascular:  Regular rate and rhythm. No peripheral edema, cyanosis or pallor.  Gastrointestinal:  Soft,  nondistended, nontender. No rebound or guarding. Normal bowel sounds. No appreciable masses or hepatomegaly. Rectal: Loysie CMA chaperone.  Diatherix performed  in office with brown stool noted on swab.  Nonthrombosed external hemorrhoid, nontender.  Mildly decreased sphincter tone.  Internal hemorrhoids not able to be felt.  No blood. Msk:  Symmetrical without gross deformities. Without edema, no deformity or joint abnormality.  Neurologic:  Alert and  oriented x4;  grossly normal neurologically.  Skin:   Dry and intact without significant lesions or rashes. Psychiatric: Oriented to person, place and time. Demonstrates good judgement and reason without abnormal affect or behaviors.  Physical Exam           RELEVANT LABS AND IMAGING: CBC    Component Value Date/Time   WBC 6.2 07/19/2023 1318   RBC 4.81 07/19/2023 1318   HGB 14.5 07/19/2023 1318   HCT 43.6 07/19/2023 1318   PLT 397.0 07/19/2023 1318   MCV 90.6 07/19/2023 1318   MCH 30.8 02/22/2022 1725   MCHC 33.3 07/19/2023 1318   RDW 13.4 07/19/2023 1318   LYMPHSABS 1.9 07/19/2023 1318   MONOABS 0.6 07/19/2023 1318   EOSABS 0.2 07/19/2023 1318   BASOSABS 0.0 07/19/2023 1318    CMP     Component Value Date/Time   NA 142 07/19/2023 1318   NA 142 07/01/2020 0949   K 4.0 07/19/2023 1318   CL 103 07/19/2023 1318   CO2 31 07/19/2023 1318   GLUCOSE 101 (H) 07/19/2023 1318   BUN 12 07/19/2023 1318   BUN 11 07/01/2020 0949   CREATININE 0.83 07/19/2023 1318   CALCIUM 9.6 07/19/2023 1318   PROT 7.3 05/29/2022 0904   ALBUMIN 4.2 05/29/2022 0904   AST 16 05/29/2022 0904   ALT 13 05/29/2022 0904   ALKPHOS 84 05/29/2022 0904   BILITOT 0.6 05/29/2022 0904   GFRNONAA >60 02/22/2022 1725   GFRAA 89 07/01/2020 0949     Assessment/Plan:   72 year old female history of tubular adenomas and internal/external hemorrhoids presenting for loose stools after course of Augmentin in August with negative CT scan in September and presence of "red jelly substance" with bowel movements.  Stools vary from watery to sand to pebbles.  Diarrhea LLQ pain Patient could have C. difficile after course of  Augmentin.  However, with description and her varying bowel movements including pebbles I do feel like she could possibly have overflow diarrhea if not of infectious etiology. - Diatherix GI pathogen and C. difficile - Start on a fiber supplement (Benefiber) - If Diatherix is negative and no improvement on fiber supplement would recommend a trial of MiraLAX to see how she does  Hemorrhoids History of external and internal hemorrhoids.  Having bleeding with bowel movements likely due to increased frequency of bowel movements.  Has had banding in the past. Physical exam with nontender external hemorrhoid, brown stool, mildly decreased finger tone - Send in  AnuCort suppositories twice daily for 14 days - Follow-up with Dr. Leonides Schanz for hemorrhoid banding  Fecal incontinence with urinating Plan as above.  If continued fecal incontinence can refer to pelvic floor physical therapy  Lara Mulch [facility] 08/03/2023, 11:33 AM  Cc: Mort Sawyers, FNP

## 2023-08-03 NOTE — Progress Notes (Signed)
I agree with the assessment and plan as outlined by [name]. 

## 2023-08-09 ENCOUNTER — Telehealth: Payer: Self-pay | Admitting: Gastroenterology

## 2023-08-09 NOTE — Telephone Encounter (Signed)
Patient advised of negative/normal diatherix testing including negative c difficile infection. She verbalizes understanding.

## 2023-08-09 NOTE — Telephone Encounter (Signed)
Stool study via diatherix is negative for infections including C diff

## 2023-08-15 ENCOUNTER — Telehealth: Payer: Self-pay | Admitting: Gastroenterology

## 2023-08-15 NOTE — Telephone Encounter (Signed)
Patient advised of Bayley's recommendation to proceed to the ER for significant rectal bleeding, or accompanying symptoms (ie chest pain, SOB, dizziness) for more emergent evaluation. Patient verbalizes understanding and states the bleeding has been better today but was more significant yesterday.

## 2023-08-15 NOTE — Telephone Encounter (Signed)
Inbound call from patient stating that when she has a bowel movement and she wipes that the toilet paper is covered in blood and she believes that its coming from her hemorrhoids. Patient is requesting a call to discuss if she should be alarmed or not. Please advise.

## 2023-08-15 NOTE — Telephone Encounter (Signed)
Patient calls with continued complaints of hematochezia with bowel movements. She states that she has been having a rather large amount of brb with bowel movements, although she is down from 3-4 daily movements to 2-3 daily movements with the additional of a fiber supplement. Patient states that her stools are now more soft blobs or formed rather than being diarrheal/watery. Patient is concerned with the amount of bleeding she has been having as she states she is very fatigued and is wondering if it is due to the bleeding. Denies any abdominal pain.  Patient was prescribed anusol suppositories BID x 14 days but says she did not get these because they were 150+dollars. She has been using the anusol cream without much relief.   I did contact pharmacy to provide a GoodRx card for the ansuol suppositories which brought the cost down to $27.70. Patient is advised she should pick these up and begin. Also advised that she should keep banding appointment with Dr Leonides Schanz for 08/27/23 as scheduled. Advised colonoscopy 04/2023 was normal with exception of some polyps (removed) and hemorrhoids so this is reassuring.  Any additional suggestions/thoughts prior to banding?

## 2023-08-27 ENCOUNTER — Ambulatory Visit: Payer: Medicare PPO | Admitting: Internal Medicine

## 2023-08-27 ENCOUNTER — Ambulatory Visit (INDEPENDENT_AMBULATORY_CARE_PROVIDER_SITE_OTHER): Payer: Medicare PPO

## 2023-08-27 ENCOUNTER — Encounter: Payer: Self-pay | Admitting: Internal Medicine

## 2023-08-27 ENCOUNTER — Encounter: Payer: Self-pay | Admitting: Gastroenterology

## 2023-08-27 VITALS — BP 124/72 | HR 74 | Ht 65.0 in | Wt 182.0 lb

## 2023-08-27 VITALS — Ht 65.0 in | Wt 182.0 lb

## 2023-08-27 DIAGNOSIS — K649 Unspecified hemorrhoids: Secondary | ICD-10-CM

## 2023-08-27 DIAGNOSIS — K641 Second degree hemorrhoids: Secondary | ICD-10-CM

## 2023-08-27 DIAGNOSIS — Z0000 Encounter for general adult medical examination without abnormal findings: Secondary | ICD-10-CM | POA: Diagnosis not present

## 2023-08-27 NOTE — Progress Notes (Signed)
PROCEDURE NOTE: The patient presents with symptomatic grade 2 hemorrhoids, requesting rubber band ligation of his/her hemorrhoidal disease.  All risks, benefits and alternative forms of therapy were described and informed consent was obtained.  She is still having diarrhea with 3-4 BMs per day for the last couple of months after her colonoscopy. She does have some rectal bleeding and rectal pain. Denies rectal itching. Anusol suppositories have not really helped much. She has had banding several years ago. Denies Latex allergy.  In the Left Lateral Decubitus position the anorectum was pre-medicated with Reticare and nitroglycerin The decision was made to band the posterior internal hemorrhoid, and the [facility]'Regan System was used to perform band ligation without complication.  Digital anorectal examination was then performed to assure proper positioning of the band, and to adjust the banded tissue as required.  The patient was discharged home without pain or other issues.  Dietary and behavioral recommendations were given and along with follow-up instructions.     The following adjunctive treatments were recommended: Trial of Imodium 2 mg PRN to help with diarrhea  The patient will return in 4 weeks for  follow-up and possible additional banding as required. No complications were encountered and the patient tolerated the procedure well.

## 2023-08-27 NOTE — Progress Notes (Signed)
Subjective:   Nicole Daniels is a 72 y.o. female who presents for Medicare Annual (Subsequent) preventive examination.  Visit Complete: Virtual I connected with  Oletta Darter on 08/27/23 by a audio enabled telemedicine application and verified that I am speaking with the correct person using two identifiers.  Patient Location: Home  Provider Location: Home Office  I discussed the limitations of evaluation and management by telemedicine. The patient expressed understanding and agreed to proceed.  Vital Signs: Because this visit was a virtual/telehealth visit, some criteria may be missing or patient reported. Any vitals not documented were not able to be obtained and vitals that have been documented are patient reported.  Patient Medicare AWV questionnaire was completed by the patient on 08/27/2023; I have confirmed that all information answered by patient is correct and no changes since this date.  Cardiac Risk Factors include: advanced age (>58men, >67 women);dyslipidemia     Objective:    Today's Vitals   08/27/23 0900  Weight: 182 lb (82.6 kg)  Height: 5\' 5"  (1.651 m)   Body mass index is 30.29 kg/m.     08/27/2023    9:03 AM 08/16/2022   11:11 AM 02/22/2022    5:16 PM 07/10/2021   12:28 PM 07/05/2021    3:10 PM 03/08/2021    9:04 AM 03/03/2020   10:00 AM  Advanced Directives  Does Patient Have a Medical Advance Directive? Yes Yes No No No Yes Yes  Type of Estate agent of [place];Living will Healthcare Power of [place];Living will    Healthcare Power of [place];Living will Healthcare Power of [place];Living will  Does patient want to make changes to medical advance directive? No - Patient declined No - Patient declined     No - Patient declined  Copy of Healthcare Power of Attorney in Chart? Yes - validated most recent copy scanned in chart (See row information) Yes - validated most recent copy scanned in chart (See row information)     Yes - validated most recent copy scanned in chart (See row information) Yes - validated most recent copy scanned in chart (See row information)  Would patient like information on creating a medical advance directive?   No - Patient declined No - Patient declined No - Patient declined      Current Medications (verified) Outpatient Encounter Medications as of 08/27/2023  Medication Sig   acetaminophen (TYLENOL) 325 MG tablet Take 650 mg by mouth every 6 (six) hours as needed.   Cholecalciferol (VITAMIN D3) 2000 units TABS Take 2,000 Units by mouth daily.   Cyanocobalamin (VITAMIN B 12 PO) Take by mouth.   ezetimibe (ZETIA) 10 MG tablet TAKE 1 TABLET BY MOUTH EVERY DAY   fluticasone (FLONASE) 50 MCG/ACT nasal spray Place 1 spray into both nostrils as needed for allergies.    hydrocortisone (ANUSOL-HC) 2.5 % rectal cream Place 1 Application rectally 2 (two) times daily.   hydrocortisone (ANUSOL-HC) 25 MG suppository Place 1 suppository (25 mg total) rectally 2 (two) times daily.   levothyroxine (SYNTHROID) 75 MCG tablet TAKE 1 TABLET BY MOUTH EVERY DAY   meclizine (ANTIVERT) 12.5 MG tablet Take 1 tablet (12.5 mg total) by mouth 3 (three) times daily as needed for dizziness.   OVER THE COUNTER MEDICATION Sinus and pain, take PRN.   OVER THE COUNTER MEDICATION Pro-biotic one capsule daily.   No facility-administered encounter medications on file as of 08/27/2023.    Allergies (verified) Simvastatin, Statins, Bactrim [sulfamethoxazole-trimethoprim], and Sertraline  History: Past Medical History:  Diagnosis Date   Abdominal pain, right lower quadrant    Acquired cyst of kidney 01/08/2012   pt denies history of    Acute sinusitis, unspecified    Adenomatous colon polyp    Allergy    Angiomyolipoma    kidney   Anxiety    OCCASIONAL   Cataract    Chest pain    Chest pain, unspecified    Depressive disorder, not elsewhere classified 01/08/2012   not currently    Diverticulosis     Dizziness and giddiness    Dysfunction of eustachian tube    H/O Spinal surgery 02/20/2017   Hemorrhoids    History of diverticulitis of colon    History of fusion of cervical spine 04/25/2018   IBS (irritable bowel syndrome)    Liver lesion 2011   Neoplasm of skin of face    malignant   OA (osteoarthritis)    knee, NECK   Obesity, unspecified    Ostium secundum type atrial septal defect    Other and unspecified hyperlipidemia    Other malaise and fatigue    Palpitations    Shortness of breath    Thyroid disease    Unspecified essential hypertension    pt states she does not have HTN   Unspecified sinusitis (chronic)    Unspecified vitamin D deficiency    Past Surgical History:  Procedure Laterality Date   ABDOMINAL HYSTERECTOMY     BREAST REDUCTION SURGERY  1990   CERVICAL DISC SURGERY     Dr.Nudleman   CHOLECYSTECTOMY     COLONOSCOPY     DILATION AND CURETTAGE OF UTERUS     EYE SURGERY Bilateral 07/30/2017   cataract sx   FLEXIBLE SIGMOIDOSCOPY  01/08/2012   Procedure: FLEXIBLE SIGMOIDOSCOPY;  Surgeon: Hart Carwin, MD;  Location: [facility];  Service: Endoscopy;  Laterality: N/A;   HEMORRHOID SURGERY     KNEE ARTHROTOMY     pt states no    LAMINECTOMY N/A 01/31/2017   Procedure: Thoracic Laminectomy and marsupialization of arachnoid cyst;  Surgeon: Shirlean Kelly, MD;  Location: [facility];  Service: Neurosurgery;  Laterality: N/A;   LEFT OOPHORECTOMY  1976   NECK SURGERY N/A 03/2018   PARTIAL HYSTERECTOMY     right ovary remains   REDUCTION MAMMAPLASTY     TONSILLECTOMY  1972   UPPER GASTROINTESTINAL ENDOSCOPY     Family History  Problem Relation Age of Onset   Hypertension Mother    Diabetes Mother    Cirrhosis Father    Heart disease Father    Hypertension Sister    Diabetes Sister    Heart disease Brother        x 2   Breast cancer Cousin    Diabetes Cousin    Diabetes Other        aunt   Stroke Other        aunt   Colon cancer Neg Hx     Esophageal cancer Neg Hx    Stomach cancer Neg Hx    Rectal cancer Neg Hx    Social History   Socioeconomic History   Marital status: Widowed    Spouse name: Not on file   Number of children: 2   Years of education: Not on file   Highest education level: 12th grade  Occupational History   Occupation: retired  Tobacco Use   Smoking status: Never   Smokeless tobacco: Never  Vaping Use   Vaping status:  Never Used  Substance and Sexual Activity   Alcohol use: No   Drug use: No   Sexual activity: Yes    Partners: Male  Other Topics Concern   Not on file  Social History Narrative   Daily caffeine    Social Determinants of Health   Financial Resource Strain: Low Risk  (08/27/2023)   Overall Financial Resource Strain (CARDIA)    Difficulty of Paying Living Expenses: Not hard at all  Food Insecurity: No Food Insecurity (08/27/2023)   Hunger Vital Sign    Worried About Running Out of Food in the Last Year: Never true    Ran Out of Food in the Last Year: Never true  Transportation Needs: No Transportation Needs (08/27/2023)   PRAPARE - Administrator, Civil Service (Medical): No    Lack of Transportation (Non-Medical): No  Physical Activity: Insufficiently Active (08/27/2023)   Exercise Vital Sign    Days of Exercise per Week: 3 days    Minutes of Exercise per Session: 40 min  Stress: No Stress Concern Present (08/27/2023)   [employer] of Occupational Health - Occupational Stress Questionnaire    Feeling of Stress : Not at all  Social Connections: Moderately Isolated (08/27/2023)   Social Connection and Isolation Panel [NHANES]    Frequency of Communication with Friends and Family: More than three times a week    Frequency of Social Gatherings with Friends and Family: More than three times a week    Attends Religious Services: More than 4 times per year    Active Member of [employer] or Organizations: No    Attends Banker Meetings: Never     Marital Status: Widowed    Tobacco Counseling Counseling given: Not Answered   Clinical Intake:  Pre-visit preparation completed: Yes  Pain : No/denies pain     Nutritional Risks: None Diabetes: No  How often do you need to have someone help you when you read instructions, pamphlets, or other written materials from your doctor or pharmacy?: 1 - Never  Interpreter Needed?: No  Information entered by :: Renie Ora, LPN   Activities of Daily Living    08/27/2023    9:03 AM  In your present state of health, do you have any difficulty performing the following activities:  Hearing? 0  Vision? 0  Difficulty concentrating or making decisions? 0  Walking or climbing stairs? 0  Dressing or bathing? 0  Doing errands, shopping? 0  Preparing Food and eating ? N  Using the Toilet? N  In the past six months, have you accidently leaked urine? N  Do you have problems with loss of bowel control? N  Managing your Medications? N  Managing your Finances? N  Housekeeping or managing your Housekeeping? N    Patient Care Team: Mort Sawyers, FNP as PCP - General (Family Medicine) Julio Sicks, MD as Consulting Physician (Neurosurgery) Vanna Scotland, MD as Consulting Physician (Urology) Jodelle Red, MD as Consulting Physician (Cardiology)  Indicate any recent Medical Services you may have received from other than [facility] providers in the past year (date may be approximate).     Assessment:   This is a routine wellness examination for Nicole Daniels.  Hearing/Vision screen Vision Screening - Comments:: Wears rx glasses - up to date with routine eye exams with  [facility]    Goals Addressed             This Visit's Progress    Patient Stated   On track  Eat healthier & increase activity       Depression Screen    08/27/2023    9:02 AM 03/21/2023   10:16 AM 02/13/2023    9:04 AM 01/24/2023   11:05 AM 01/05/2023    9:19 AM 11/29/2022    8:43 AM 08/16/2022    11:09 AM  PHQ 2/9 Scores  PHQ - 2 Score 0 2  0 4 1 0  PHQ- 9 Score  5   9 2    Exception Documentation   Patient refusal        Fall Risk    08/27/2023    9:00 AM 06/07/2023    2:24 PM 03/21/2023   10:16 AM 01/24/2023   11:04 AM 01/05/2023    9:19 AM  Fall Risk   Falls in the past year? 0 0 0 0 0  Number falls in past yr: 0 0  0 0  Injury with Fall? 0 0 0 0 0  Risk for fall due to : No Fall Risks No Fall Risks   No Fall Risks  Follow up Falls prevention discussed Falls evaluation completed Falls evaluation completed;Education provided;Falls prevention discussed Falls evaluation completed;Education provided;Falls prevention discussed Falls evaluation completed    MEDICARE RISK AT HOME: Medicare Risk at Home Any stairs in or around the home?: Yes If so, are there any without handrails?: No Home free of loose throw rugs in walkways, pet beds, electrical cords, etc?: Yes Adequate lighting in your home to reduce risk of falls?: Yes Life alert?: No Use of a cane, walker or w/c?: No Grab bars in the bathroom?: Yes Shower chair or bench in shower?: Yes Elevated toilet seat or a handicapped toilet?: Yes  TIMED UP AND GO:  Was the test performed?  No    Cognitive Function:    02/20/2018    9:43 AM 01/01/2017   11:47 AM  MMSE - Mini Mental State Exam  Orientation to time 5 5  Orientation to Place 5 5  Registration 3 3  Attention/ Calculation 5 0  Recall 3 3  Language- name 2 objects 2 0  Language- repeat 1 1  Language- follow 3 step command 3 3  Language- read & follow direction 1 0  Write a sentence 1 0  Copy design 1 0  Total score 30 20        08/27/2023    9:04 AM 08/16/2022   11:12 AM  6CIT Screen  What Year? 0 points 0 points  What month? 0 points 0 points  What time? 0 points 0 points  Count back from 20 0 points 0 points  Months in reverse 0 points 0 points  Repeat phrase 0 points 0 points  Total Score 0 points 0 points    Immunizations Immunization History   Administered Date(s) Administered   Influenza,inj,Quad PF,6+ Mos 09/01/2015   PFIZER(Purple Top)SARS-COV-2 Vaccination 06/25/2020, 07/16/2020   Pneumococcal Polysaccharide-23 05/29/2022   Td 10/30/1988, 08/25/2008    TDAP status: Due, Education has been provided regarding the importance of this vaccine. Advised may receive this vaccine at local pharmacy or Health Dept. Aware to provide a copy of the vaccination record if obtained from local pharmacy or Health Dept. Verbalized acceptance and understanding.  Flu Vaccine status: Due, Education has been provided regarding the importance of this vaccine. Advised may receive this vaccine at local pharmacy or Health Dept. Aware to provide a copy of the vaccination record if obtained from local pharmacy or Health Dept. Verbalized acceptance and understanding.  Pneumococcal vaccine status: Up to date  Covid-19 vaccine status: Completed vaccines  Qualifies for Shingles Vaccine? Yes   Zostavax completed No   Shingrix Completed?: No.    Education has been provided regarding the importance of this vaccine. Patient has been advised to call insurance company to determine out of pocket expense if they have not yet received this vaccine. Advised may also receive vaccine at local pharmacy or Health Dept. Verbalized acceptance and understanding.  Screening Tests Health Maintenance  Topic Date Due   Zoster Vaccines- Shingrix (1 of 2) Never done   Pneumonia Vaccine 94+ Years old (2 of 2 - PCV) 05/30/2023   COVID-19 Vaccine (3 - 2023-24 season) 07/01/2023   INFLUENZA VACCINE  01/28/2024 (Originally 05/31/2023)   Medicare Annual Wellness (AWV)  08/26/2024   MAMMOGRAM  10/17/2024   Colonoscopy  05/07/2026   DEXA SCAN  Completed   Hepatitis C Screening  Completed   HPV VACCINES  Aged Out   DTaP/Tdap/Td  Discontinued    Health Maintenance  Health Maintenance Due  Topic Date Due   Zoster Vaccines- Shingrix (1 of 2) Never done   Pneumonia Vaccine 70+  Years old (2 of 2 - PCV) 05/30/2023   COVID-19 Vaccine (3 - 2023-24 season) 07/01/2023    Colorectal cancer screening: Type of screening: Colonoscopy. Completed 05/08/2023. Repeat every 3 years  Mammogram status: Completed 10/17/2022. Repeat every year  Bone Density status: Completed 10/06/2021. Results reflect: Bone density results: OSTEOPOROSIS. Repeat every 2 years.  Lung Cancer Screening: (Low Dose CT Chest recommended if Age 38-80 years, 20 pack-year currently smoking OR have quit w/in 15years.) does not qualify.   Lung Cancer Screening Referral: n/a  Additional Screening:  Hepatitis C Screening: does not qualify; Completed 01/01/2017  Vision Screening: Recommended annual ophthalmology exams for early detection of glaucoma and other disorders of the eye. Is the patient up to date with their annual eye exam?  Yes  Who is the provider or what is the name of the office in which the patient attends annual eye exams? [facility]  If pt is not established with a provider, would they like to be referred to a provider to establish care? No .   Dental Screening: Recommended annual dental exams for proper oral hygiene   Community Resource Referral / Chronic Care Management: CRR required this visit?  No   CCM required this visit?  No     Plan:     I have personally reviewed and noted the following in the patient's chart:   Medical and social history Use of alcohol, tobacco or illicit drugs  Current medications and supplements including opioid prescriptions. Patient is not currently taking opioid prescriptions. Functional ability and status Nutritional status Physical activity Advanced directives List of other physicians Hospitalizations, surgeries, and ER visits in previous 12 months Vitals Screenings to include cognitive, depression, and falls Referrals and appointments  In addition, I have reviewed and discussed with patient certain preventive protocols, quality  metrics, and best practice recommendations. A written personalized care plan for preventive services as well as general preventive health recommendations were provided to patient.     Lorrene Reid, LPN   16/07/9603   After Visit Summary: (MyChart) Due to this being a telephonic visit, the after visit summary with patients personalized plan was offered to patient via MyChart   Nurse Notes: none

## 2023-08-27 NOTE — Patient Instructions (Signed)
HEMORRHOID BANDING PROCEDURE    FOLLOW-UP CARE   The procedure you have had should have been relatively painless since the banding of the area involved does not have nerve endings and there is no pain sensation.  The rubber band cuts off the blood supply to the hemorrhoid and the band may fall off as soon as 48 hours after the banding (the band may occasionally be seen in the toilet bowl following a bowel movement). You may notice a temporary feeling of fullness in the rectum which should respond adequately to plain Tylenol or Motrin.  Following the banding, avoid strenuous exercise that evening and resume full activity the next day.  A sitz bath (soaking in a warm tub) or bidet is soothing, and can be useful for cleansing the area after bowel movements.     To avoid constipation, take two tablespoons of natural wheat bran, natural oat bran, flax, Benefiber or any over the counter fiber supplement and increase your water intake to 7-8 glasses daily.    Unless you have been prescribed anorectal medication, do not put anything inside your rectum for two weeks: No suppositories, enemas, fingers, etc.  Occasionally, you may have more bleeding than usual after the banding procedure.  This is often from the untreated hemorrhoids rather than the treated one.  Don't be concerned if there is a tablespoon or so of blood.  If there is more blood than this, lie flat with your bottom higher than your head and apply an ice pack to the area. If the bleeding does not stop within a half an hour or if you feel faint, call our office at [phone] or go to the emergency room.  Problems are not common; however, if there is a substantial amount of bleeding, severe pain, chills, fever or difficulty passing urine (very rare) or other problems, you should call us at [phone] or report to the nearest emergency room.  Do not stay seated continuously for more than 2-3 hours for a day or two after the procedure.   Tighten your buttock muscles 10-15 times every two hours and take 10-15 deep breaths every 1-2 hours.  Do not spend more than a few minutes on the toilet if you cannot empty your bowel; instead re-visit the toilet at a later time.   Thank you for entrusting me with your care and for choosing [facility], Dr. Christia Reading

## 2023-08-27 NOTE — Patient Instructions (Signed)
Ms. Hamlyn , Thank you for taking time to come for your Medicare Wellness Visit. I appreciate your ongoing commitment to your health goals. Please review the following plan we discussed and let me know if I can assist you in the future.   Referrals/Orders/Follow-Ups/Clinician Recommendations: Aim for 30 minutes of exercise or brisk walking, 6-8 glasses of water, and 5 servings of fruits and vegetables each day.   This is a list of the screening recommended for you and due dates:  Health Maintenance  Topic Date Due   Zoster (Shingles) Vaccine (1 of 2) Never done   Pneumonia Vaccine (2 of 2 - PCV) 05/30/2023   COVID-19 Vaccine (3 - 2023-24 season) 07/01/2023   Flu Shot  01/28/2024*   Medicare Annual Wellness Visit  08/26/2024   Mammogram  10/17/2024   Colon Cancer Screening  05/07/2026   DEXA scan (bone density measurement)  Completed   Hepatitis C Screening  Completed   HPV Vaccine  Aged Out   DTaP/Tdap/Td vaccine  Discontinued  *Topic was postponed. The date shown is not the original due date.    Advanced directives: (Copy Requested) Please bring a copy of your health care power of attorney and living will to the office to be added to your chart at your convenience.  Next Medicare Annual Wellness Visit scheduled for next year: Yes  Insert Preventive Care attachment Insert FALL PREVENTION attachment if needed

## 2023-09-24 ENCOUNTER — Ambulatory Visit
Admission: RE | Admit: 2023-09-24 | Discharge: 2023-09-24 | Disposition: A | Discharge status: Home / Self Care | Payer: Medicare PPO | Source: Ambulatory Visit | Attending: Urology | Admitting: Urology

## 2023-09-24 ENCOUNTER — Other Ambulatory Visit: Payer: Self-pay | Admitting: Family

## 2023-09-24 DIAGNOSIS — Z1231 Encounter for screening mammogram for malignant neoplasm of breast: Secondary | ICD-10-CM

## 2023-09-24 DIAGNOSIS — N2889 Other specified disorders of kidney and ureter: Secondary | ICD-10-CM | POA: Insufficient documentation

## 2023-09-24 DIAGNOSIS — N281 Cyst of kidney, acquired: Secondary | ICD-10-CM | POA: Diagnosis not present

## 2023-10-11 ENCOUNTER — Ambulatory Visit: Payer: Medicare PPO | Admitting: Family

## 2023-10-11 ENCOUNTER — Encounter: Payer: Self-pay | Admitting: Family

## 2023-10-11 VITALS — BP 134/76 | HR 82 | Temp 98.4°F | Ht 65.0 in | Wt 181.6 lb

## 2023-10-11 DIAGNOSIS — E66811 Obesity, class 1: Secondary | ICD-10-CM | POA: Diagnosis not present

## 2023-10-11 DIAGNOSIS — E039 Hypothyroidism, unspecified: Secondary | ICD-10-CM

## 2023-10-11 DIAGNOSIS — E6609 Other obesity due to excess calories: Secondary | ICD-10-CM

## 2023-10-11 DIAGNOSIS — E782 Mixed hyperlipidemia: Secondary | ICD-10-CM | POA: Diagnosis not present

## 2023-10-11 DIAGNOSIS — M8589 Other specified disorders of bone density and structure, multiple sites: Secondary | ICD-10-CM | POA: Diagnosis not present

## 2023-10-11 DIAGNOSIS — J329 Chronic sinusitis, unspecified: Secondary | ICD-10-CM

## 2023-10-11 DIAGNOSIS — J0140 Acute pansinusitis, unspecified: Secondary | ICD-10-CM | POA: Insufficient documentation

## 2023-10-11 DIAGNOSIS — Z0000 Encounter for general adult medical examination without abnormal findings: Secondary | ICD-10-CM | POA: Diagnosis not present

## 2023-10-11 DIAGNOSIS — Z6830 Body mass index (BMI) 30.0-30.9, adult: Secondary | ICD-10-CM

## 2023-10-11 LAB — LIPID PANEL
Cholesterol: 196 mg/dL (ref 0–200)
HDL: 46.5 mg/dL (ref >39.00)
LDL Cholesterol: 98 mg/dL (ref 0–99)
NonHDL: 149.12
Total CHOL/HDL Ratio: 4
Triglycerides: 256 mg/dL — ABNORMAL HIGH (ref 0.0–149.0)
VLDL: 51.2 mg/dL — ABNORMAL HIGH (ref 0.0–40.0)

## 2023-10-11 LAB — TSH: TSH: 1.3 u[IU]/mL (ref 0.35–5.50)

## 2023-10-11 MED ORDER — CHLORPHENIRAMINE MALEATE 4 MG PO TABS: 4.0000 mg | ORAL_TABLET | Freq: Two times a day (BID) | ORAL | Status: AC | PRN

## 2023-10-11 MED ORDER — AMOXICILLIN-POT CLAVULANATE 875-125 MG PO TABS
1.0000 | ORAL_TABLET | Freq: Two times a day (BID) | ORAL | 0 refills | Status: DC
Start: 2023-10-11 — End: 2023-12-12

## 2023-10-11 NOTE — Assessment & Plan Note (Signed)
Prescription given for augmentin 875/125 mg po bid for ten days. Pt to continue tylenol/ibuprofen prn sinus pain. Continue with humidifier prn and steam showers recommended as well. instructed If no symptom improvement in 48 hours please f/u ? ?

## 2023-10-11 NOTE — Assessment & Plan Note (Signed)
Pt advised to work on diet and exercise as tolerated  

## 2023-10-11 NOTE — Progress Notes (Signed)
Subjective:  Patient ID: Nicole Daniels, female    DOB: [birth year]  Age: 72 y.o. MRN: [id number]  Patient Care Team: Mort Sawyers, FNP as PCP - General (Family Medicine) Julio Sicks, MD as Consulting Physician (Neurosurgery) Vanna Scotland, MD as Consulting Physician (Urology) Jodelle Red, MD as Consulting Physician (Cardiology)   CC:  Chief Complaint  Patient presents with   Annual Exam    HPI Nicole Daniels is a 72 y.o. female who presents today for an annual physical exam. She reports consuming a general diet.  Walking at home  She generally feels well. She reports sleeping well. She does have additional problems to discuss today.   Vision:Within last year Dental:Receives regular dental care   Mammogram: scheduled 10/22/23 Colonoscopy:05/08/23 with colon polyp and hemorrhoid, every three years  Scheduled for hemorrhoid banding Bone density scan: osteopenia, 10/06/21   Pt is with acute concerns.  One week ago, about 7-8 days started with sinus congestion and pressure. Lost her voice for 2 days, improving however comes and goes. Coughing productive with abn color sputum. No fever or chills. Slight sore throat. No ear pain. Increased pnd.   Advanced Directives Patient does have advanced directives including  advanced directive . She does have a copy in the electronic medical record.   DEPRESSION SCREENING    10/11/2023   10:57 AM 08/27/2023    9:02 AM 03/21/2023   10:16 AM 02/13/2023    9:04 AM 01/24/2023   11:05 AM 01/05/2023    9:19 AM 11/29/2022    8:43 AM  PHQ 2/9 Scores  PHQ - 2 Score 0 0 2  0 4 1  PHQ- 9 Score 0  5   9 2   Exception Documentation    Patient refusal        ROS: Negative unless specifically indicated above in HPI.    Current Outpatient Medications:    acetaminophen (TYLENOL) 325 MG tablet, Take 650 mg by mouth every 6 (six) hours as needed., Disp: , Rfl:    amoxicillin-clavulanate (AUGMENTIN) 875-125 MG tablet, Take 1 tablet  by mouth 2 (two) times daily., Disp: 20 tablet, Rfl: 0   chlorpheniramine (ALLERGY RELIEF) 4 MG tablet, Take 1 tablet (4 mg total) by mouth 2 (two) times daily as needed for allergies., Disp: , Rfl:    Cholecalciferol (VITAMIN D3) 2000 units TABS, Take 2,000 Units by mouth daily., Disp: , Rfl:    Cyanocobalamin (VITAMIN B 12 PO), Take by mouth., Disp: , Rfl:    ezetimibe (ZETIA) 10 MG tablet, TAKE 1 TABLET BY MOUTH EVERY DAY, Disp: 90 tablet, Rfl: 3   fluticasone (FLONASE) 50 MCG/ACT nasal spray, Place 1 spray into both nostrils as needed for allergies. , Disp: , Rfl:    levothyroxine (SYNTHROID) 75 MCG tablet, TAKE 1 TABLET BY MOUTH EVERY DAY, Disp: 90 tablet, Rfl: 1   meclizine (ANTIVERT) 12.5 MG tablet, Take 1 tablet (12.5 mg total) by mouth 3 (three) times daily as needed for dizziness., Disp: 30 tablet, Rfl: 0   OVER THE COUNTER MEDICATION, Sinus and pain, take PRN., Disp: , Rfl:    OVER THE COUNTER MEDICATION, Pro-biotic one capsule daily., Disp: , Rfl:     Objective:    BP 134/76 (BP Location: Left Arm, Patient Position: Sitting, Cuff Size: Normal)   Pulse 82   Temp 98.4 F (36.9 C) (Temporal)   Ht 5\' 5"  (1.651 m)   Wt 181 lb 9.6 oz (82.4 kg)   SpO2 97%   BMI  30.22 kg/m   BP Readings from Last 3 Encounters:  10/11/23 134/76  08/27/23 124/72  06/07/23 134/62      Physical Exam Constitutional:      General: She is not in acute distress.    Appearance: Normal appearance. She is obese. She is not ill-appearing.  HENT:     Head: Normocephalic.     Right Ear: Tympanic membrane normal.     Left Ear: Tympanic membrane normal.     Nose: Congestion present.     Right Turbinates: Enlarged and swollen.     Right Sinus: Maxillary sinus tenderness present.     Left Sinus: Maxillary sinus tenderness present.     Mouth/Throat:     Mouth: Mucous membranes are moist.     Pharynx: Postnasal drip present.  Eyes:     Extraocular Movements: Extraocular movements intact.     Pupils:  Pupils are equal, round, and reactive to light.  Cardiovascular:     Rate and Rhythm: Normal rate and regular rhythm.  Pulmonary:     Effort: Pulmonary effort is normal.     Breath sounds: Normal breath sounds.  Abdominal:     General: Abdomen is flat. Bowel sounds are normal.     Palpations: Abdomen is soft.     Tenderness: There is no guarding or rebound.  Musculoskeletal:        General: Normal range of motion.     Cervical back: Normal range of motion.  Skin:    General: Skin is warm.     Capillary Refill: Capillary refill takes less than 2 seconds.  Neurological:     General: No focal deficit present.     Mental Status: She is alert.  Psychiatric:        Mood and Affect: Mood normal.        Behavior: Behavior normal.        Thought Content: Thought content normal.        Judgment: Judgment normal.          Assessment & Plan:  Obesity (BMI 30.0-34.9)  Acute non-recurrent pansinusitis Assessment & Plan: Prescription given for augmentin 875/125 mg po bid for ten days. Pt to continue tylenol/ibuprofen prn sinus pain. Continue with humidifier prn and steam showers recommended as well. instructed If no symptom improvement in 48 hours please f/u   Orders: -     Amoxicillin-Pot Clavulanate; Take 1 tablet by mouth 2 (two) times daily.  Dispense: 20 tablet; Refill: 0 -     Chlorpheniramine Maleate; Take 1 tablet (4 mg total) by mouth 2 (two) times daily as needed for allergies.  Osteopenia of multiple sites Assessment & Plan: Bone density ordered pending results.  Recommend daily vitamin D and calcium  Work on weight bearing exercises  bone dexa for every two years.     Orders: -     DG Bone Density; Future  Acquired hypothyroidism Assessment & Plan: Tsh ordered pending results.  Cont levothyroxine 75 mg once daily  Orders: -     TSH  Mixed hyperlipidemia Assessment & Plan: Currently on zetia    Orders: -     Lipid panel  Class 1 obesity due to excess  calories without serious comorbidity with body mass index (BMI) of 30.0 to 30.9 in adult Assessment & Plan: Pt advised to work on diet and exercise as tolerated    Encounter for general adult medical examination without abnormal findings Assessment & Plan: Patient Counseling(The following topics were reviewed):  Preventative  care handout given to pt  Health maintenance and immunizations reviewed. Please refer to Health maintenance section. Pt advised on safe sex, wearing seatbelts in car, and proper nutrition labwork ordered today for annual Dental health: Discussed importance of regular tooth brushing, flossing, and dental visits.    Recurrent sinusitis -     Chlorpheniramine Maleate; Take 1 tablet (4 mg total) by mouth 2 (two) times daily as needed for allergies.      Follow-up: Return in about 6 months (around 04/10/2024) for f/u cholesterol.   Mort Sawyers, FNP

## 2023-10-11 NOTE — Assessment & Plan Note (Signed)
Bone density ordered pending results.  Recommend daily vitamin D and calcium  Work on weight bearing exercises  bone dexa for every two years.

## 2023-10-11 NOTE — Patient Instructions (Addendum)
  I have sent an electronic order over to your preferred location for the following:   []   2D Mammogram  []   3D Mammogram  [x]   Bone Density   Please give this center a call to get scheduled at your convenience.   [x]   [facility]      [address] [place], [place]        [phone]          ------------------------------------

## 2023-10-11 NOTE — Assessment & Plan Note (Signed)
Currently on zetia

## 2023-10-11 NOTE — Assessment & Plan Note (Signed)

## 2023-10-11 NOTE — Assessment & Plan Note (Signed)
Tsh ordered pending results.  Cont levothyroxine 75 mg once daily

## 2023-10-22 ENCOUNTER — Ambulatory Visit
Admission: RE | Admit: 2023-10-22 | Discharge: 2023-10-22 | Disposition: A | Discharge status: Home / Self Care | Payer: Medicare PPO | Source: Ambulatory Visit

## 2023-10-22 DIAGNOSIS — Z1231 Encounter for screening mammogram for malignant neoplasm of breast: Secondary | ICD-10-CM

## 2023-10-25 ENCOUNTER — Encounter: Payer: Medicare PPO | Admitting: Internal Medicine

## 2023-11-06 ENCOUNTER — Other Ambulatory Visit: Payer: Self-pay | Admitting: Family

## 2023-11-06 DIAGNOSIS — E039 Hypothyroidism, unspecified: Secondary | ICD-10-CM

## 2023-11-06 DIAGNOSIS — E782 Mixed hyperlipidemia: Secondary | ICD-10-CM

## 2023-11-06 DIAGNOSIS — M791 Myalgia, unspecified site: Secondary | ICD-10-CM

## 2023-11-07 MED ORDER — LEVOTHYROXINE SODIUM 75 MCG PO TABS: 75.0000 ug | ORAL_TABLET | Freq: Every day | ORAL | 3 refills | Status: DC

## 2023-11-07 MED ORDER — EZETIMIBE 10 MG PO TABS: 10.0000 mg | ORAL_TABLET | Freq: Every day | ORAL | 3 refills | Status: DC

## 2023-11-26 DIAGNOSIS — M5481 Occipital neuralgia: Secondary | ICD-10-CM | POA: Diagnosis not present

## 2023-11-26 DIAGNOSIS — Z6831 Body mass index (BMI) 31.0-31.9, adult: Secondary | ICD-10-CM | POA: Diagnosis not present

## 2023-12-12 ENCOUNTER — Ambulatory Visit (INDEPENDENT_AMBULATORY_CARE_PROVIDER_SITE_OTHER)
Admission: RE | Admit: 2023-12-12 | Discharge: 2023-12-12 | Disposition: A | Discharge status: Home / Self Care | Payer: Medicare HMO | Source: Ambulatory Visit | Attending: Family Medicine | Admitting: Family Medicine

## 2023-12-12 ENCOUNTER — Other Ambulatory Visit: Payer: Self-pay | Admitting: Family Medicine

## 2023-12-12 ENCOUNTER — Ambulatory Visit (INDEPENDENT_AMBULATORY_CARE_PROVIDER_SITE_OTHER): Payer: Medicare HMO | Admitting: Family Medicine

## 2023-12-12 ENCOUNTER — Ambulatory Visit: Payer: Self-pay | Admitting: Family

## 2023-12-12 ENCOUNTER — Encounter: Payer: Self-pay | Admitting: Family Medicine

## 2023-12-12 VITALS — BP 134/68 | HR 81 | Temp 98.4°F | Ht 65.0 in | Wt 172.0 lb

## 2023-12-12 DIAGNOSIS — R0602 Shortness of breath: Secondary | ICD-10-CM

## 2023-12-12 DIAGNOSIS — R051 Acute cough: Secondary | ICD-10-CM | POA: Diagnosis not present

## 2023-12-12 DIAGNOSIS — R059 Cough, unspecified: Secondary | ICD-10-CM | POA: Diagnosis not present

## 2023-12-12 MED ORDER — DOXYCYCLINE HYCLATE 100 MG PO TABS: 100.0000 mg | ORAL_TABLET | Freq: Two times a day (BID) | ORAL | 0 refills | Status: DC

## 2023-12-12 MED ORDER — ALBUTEROL SULFATE HFA 108 (90 BASE) MCG/ACT IN AERS: 2.0000 | INHALATION_SPRAY | Freq: Four times a day (QID) | RESPIRATORY_TRACT | 2 refills | Status: DC | PRN

## 2023-12-12 MED ORDER — PREDNISONE 20 MG PO TABS: ORAL_TABLET | ORAL | 0 refills | Status: DC

## 2023-12-12 NOTE — Progress Notes (Signed)
Patient ID: Nicole Daniels, female    DOB: 27-Feb-1951, 73 y.o.   MRN: [id number]  This visit was conducted in person.  BP 134/68   Pulse 81   Temp 98.4 F (36.9 C) (Oral)   Ht 5\' 5"  (1.651 m)   Wt 172 lb (78 kg)   SpO2 92%   BMI 28.62 kg/m    CC:  Chief Complaint  Patient presents with   Cough    Patient believes that she had the flu last week(but did not test) the cough has been ongoing since 2/1.Had some sore throat. Ran a fever at one point.     Low energy     Subjective:   HPI: Nicole Daniels is a 73 y.o. female patient of Tabitha Dugal's presenting on 12/12/2023 for Cough (Patient believes that she had the flu last week(but did not test) the cough has been ongoing since 2/1.Had some sore throat. Ran a fever at one point. /) and Low energy     Date of onset:  2 weeks Initial symptoms included  fatigue, weakness,  body aches, low fever Symptoms progressed to  cough, productive, clear   Minimal nasal congestion  Neck soreness this week.  Nausea/indigestion, no appetite.  Last  fever measured last week... night sweats noted last night.  Gradually worsening as opposed to gradually getting better okay  no ear pain or face pain  Chest tightness intermittent, centrally.  She feeling Short of breath with exertion.  Some issues with coughing at night.    Sick contacts:  significant other COVID testing:   none     She has tried to treat with  delsym cough, zarbees, tylenol ES , allergy medication.     No history of chronic lung disease such as asthma or COPD. Non-smoker.   Moderate po intake, moderate water intake as propel.     Relevant past medical, surgical, family and social history reviewed and updated as indicated. Interim medical history since our last visit reviewed. Allergies and medications reviewed and updated. Outpatient Medications Prior to Visit  Medication Sig Dispense Refill   acetaminophen (TYLENOL) 325 MG tablet Take 650 mg by mouth  every 6 (six) hours as needed.     amoxicillin-clavulanate (AUGMENTIN) 875-125 MG tablet Take 1 tablet by mouth 2 (two) times daily. 20 tablet 0   chlorpheniramine (ALLERGY RELIEF) 4 MG tablet Take 1 tablet (4 mg total) by mouth 2 (two) times daily as needed for allergies.     Cholecalciferol (VITAMIN D3) 2000 units TABS Take 2,000 Units by mouth daily.     Cyanocobalamin (VITAMIN B 12 PO) Take by mouth.     ezetimibe (ZETIA) 10 MG tablet Take 1 tablet (10 mg total) by mouth daily. 90 tablet 3   fluticasone (FLONASE) 50 MCG/ACT nasal spray Place 1 spray into both nostrils as needed for allergies.      levothyroxine (SYNTHROID) 75 MCG tablet Take 1 tablet (75 mcg total) by mouth daily. 90 tablet 3   meclizine (ANTIVERT) 12.5 MG tablet Take 1 tablet (12.5 mg total) by mouth 3 (three) times daily as needed for dizziness. 30 tablet 0   OVER THE COUNTER MEDICATION Sinus and pain, take PRN.     OVER THE COUNTER MEDICATION Pro-biotic one capsule daily.     No facility-administered medications prior to visit.     Per HPI unless specifically indicated in ROS section below Review of Systems  Constitutional:  Positive for fatigue and fever.  HENT:  Positive for congestion. Negative for ear pain, rhinorrhea and sinus pressure.   Eyes:  Negative for pain.  Respiratory:  Positive for cough and shortness of breath.   Cardiovascular:  Positive for chest pain. Negative for palpitations and leg swelling.  Gastrointestinal:  Negative for abdominal pain.  Genitourinary:  Negative for dysuria and vaginal bleeding.  Musculoskeletal:  Negative for back pain.  Neurological:  Negative for syncope, light-headedness and headaches.  Psychiatric/Behavioral:  Negative for dysphoric mood.    Objective:  BP 134/68   Pulse 81   Temp 98.4 F (36.9 C) (Oral)   Ht 5\' 5"  (1.651 m)   Wt 172 lb (78 kg)   SpO2 92%   BMI 28.62 kg/m   Wt Readings from Last 3 Encounters:  12/12/23 172 lb (78 kg)  10/11/23 181 lb 9.6 oz  (82.4 kg)  08/27/23 182 lb (82.6 kg)      Physical Exam Constitutional:      General: She is not in acute distress.    Appearance: Normal appearance. She is well-developed. She is not ill-appearing or toxic-appearing.  HENT:     Head: Normocephalic.     Right Ear: Hearing, tympanic membrane, ear canal and external ear normal. Tympanic membrane is not erythematous, retracted or bulging.     Left Ear: Hearing, tympanic membrane, ear canal and external ear normal. Tympanic membrane is not erythematous, retracted or bulging.     Nose: No mucosal edema or rhinorrhea.     Right Sinus: No maxillary sinus tenderness or frontal sinus tenderness.     Left Sinus: No maxillary sinus tenderness or frontal sinus tenderness.     Mouth/Throat:     Pharynx: Uvula midline.  Eyes:     General: Lids are normal. Lids are everted, no foreign bodies appreciated.     Conjunctiva/sclera: Conjunctivae normal.     Pupils: Pupils are equal, round, and reactive to light.  Neck:     Thyroid: No thyroid mass or thyromegaly.     Vascular: No carotid bruit.     Trachea: Trachea normal.  Cardiovascular:     Rate and Rhythm: Normal rate and regular rhythm.     Pulses: Normal pulses.     Heart sounds: Normal heart sounds, S1 normal and S2 normal. No murmur heard.    No friction rub. No gallop.  Pulmonary:     Effort: Pulmonary effort is normal. No tachypnea or respiratory distress.     Breath sounds: Examination of the right-upper field reveals rhonchi. Examination of the right-middle field reveals rhonchi. Examination of the right-lower field reveals rhonchi. Examination of the left-lower field reveals rhonchi. Wheezing and rhonchi present. No decreased breath sounds or rales.  Abdominal:     General: Bowel sounds are normal.     Palpations: Abdomen is soft.     Tenderness: There is no abdominal tenderness.  Musculoskeletal:     Cervical back: Normal range of motion and neck supple.  Skin:    General: Skin is  warm and dry.     Findings: No rash.  Neurological:     Mental Status: She is alert.  Psychiatric:        Mood and Affect: Mood is not anxious or depressed.        Speech: Speech normal.        Behavior: Behavior normal. Behavior is cooperative.        Thought Content: Thought content normal.        Judgment: Judgment normal.  Results for orders placed or performed in visit on 10/11/23  TSH   Collection Time: 10/11/23 11:34 AM  Result Value Ref Range   TSH 1.30 0.35 - 5.50 uIU/mL  Lipid panel   Collection Time: 10/11/23 11:34 AM  Result Value Ref Range   Cholesterol 196 0 - 200 mg/dL   Triglycerides 846.9 (H) 0.0 - 149.0 mg/dL   HDL 62.95 >28.41 mg/dL   VLDL 32.4 (H) 0.0 - 40.1 mg/dL   LDL Cholesterol 98 0 - 99 mg/dL   Total CHOL/HDL Ratio 4    NonHDL 149.12     Assessment and Plan Acute, most likely initial influenza versus RSV now some amount of concern for bacterial superinfection.  Lung exam concerning with wheezing and rhonchi greatest on right.  Will evaluate with chest x-ray. Start prednisone taper 60/40/20 x 7 days.  Can use albuterol 2 puffs every 4-6 hours as needed for coughing fits or shortness of breath. We are past the treatment timeline for use of Tamiflu. Will start antibiotics but use chest x-ray to guide antibiotic choice.  Return and ER precautions provided. Acute cough -     DG Chest 2 View; Future  Shortness of breath -     DG Chest 2 View; Future  Other orders -     predniSONE; 3 tabs by mouth daily x 3 days, then 2 tabs by mouth daily x 2 days then 1 tab by mouth daily x 2 days  Dispense: 15 tablet; Refill: 0 -     Albuterol Sulfate HFA; Inhale 2 puffs into the lungs every 6 (six) hours as needed for wheezing or shortness of breath.  Dispense: 8 g; Refill: 2    No follow-ups on file.   Kerby Nora, MD

## 2023-12-12 NOTE — Telephone Encounter (Signed)
Copied from CRM [id number]. Topic: Clinical - Medical Advice >> Dec 12, 2023  8:11 AM Nicole Daniels wrote: Reason for CRM: Pt had the flu a week ago and states she is not doing better. There is a tightness in her chest and sometimes she has shortness of breath. PT would like to see her provider but the decision tree promoted a message to nurse triage. I advised the pt a nurse would give her a call back to get her taken care of   Callback # [phone]  Chief Complaint: breathing difficulty Symptoms: pt c/o coughing, SOB w/exertion - pt states walk a few steps then has to set down to catch her breath.  SOB noted by Nurse on phone while triaging pt at times. Frequency: x1.5 weeks (this Friday will be 2 weeks) Pertinent Negatives: Patient denies vomiting Disposition: [x] ED /[] Urgent Care (no appt availability in office) / [] Appointment(In office/virtual)/ []  [facility]/ [] Home Care/ [] Refused Recommended Disposition /[] [facility]/ []  Follow-up with PCP Additional Notes: pt stated she had flu - it started last week: pt was never tested for flu. Pt c/o lightheaded, sweats, Nausea, chest pain "just uncomfortable" 4 -6/10 pain, when I breathe I feel pain/tightness. Pt stated last week pain radiated down left arm - pt r/t pain to pinched nerve, indigestion feeling comes & goes, chills, abd pain, ear/throat irritation.Pt also stated fever last week.  Reason for Disposition  [1] MODERATE difficulty breathing (e.g., speaks in phrases, SOB even at rest, pulse 100-120) AND [2] NEW-onset or WORSE than normal  [1] Chest pain (or "angina") comes and goes AND [2] is happening more often (increasing in frequency) or getting worse (increasing in severity)  (Exception: Chest pains that last only a few seconds.)  Answer Assessment - Initial Assessment Questions 1. RESPIRATORY STATUS: "Describe your breathing?" (e.g., wheezing, shortness of breath, unable to speak, severe coughing)      SOB  w/exertion - ex walk then sit for a moment, coughing - clear/yellow, wheezing 2. ONSET: "When did this breathing problem begin?"      X 1.5 weeks 3. PATTERN "Does the difficult breathing come and go, or has it been constant since it started?"      Comes and goes 4. SEVERITY: "How bad is your breathing?" (e.g., mild, moderate, severe)    - MILD: No SOB at rest, mild SOB with walking, speaks normally in sentences, can lie down, no retractions, pulse < 100.    - MODERATE: SOB at rest, SOB with minimal exertion and prefers to sit, cannot lie down flat, speaks in phrases, mild retractions, audible wheezing, pulse 100-120.    - SEVERE: Very SOB at rest, speaks in single words, struggling to breathe, sitting hunched forward, retractions, pulse > 120      moderate 5. RECURRENT SYMPTOM: "Have you had difficulty breathing before?" If Yes, ask: "When was the last time?" and "What happened that time?"      N/a 6. CARDIAC HISTORY: "Do you have any history of heart disease?" (e.g., heart attack, angina, bypass surgery, angioplasty)      no 7. LUNG HISTORY: "Do you have any history of lung disease?"  (e.g., pulmonary embolus, asthma, emphysema)     no 8. CAUSE: "What do you think is causing the breathing problem?"      Due to being sick for so long 9. OTHER SYMPTOMS: "Do you have any other symptoms? (e.g., dizziness, runny nose, cough, chest pain, fever)    Fever last week, lightheaded, sweats,  Nausea, chest pain "just uncomfortable" 4 -6/10 pain, when I breathe I feel pain/tightness, last week I had pain that radiated down left arm r/t pinched nerve, indigestion feeling comes & goes, burping a lot, chills 10. O2 SATURATION MONITOR:  "Do you use an oxygen saturation monitor (pulse oximeter) at home?" If Yes, ask: "What is your reading (oxygen level) today?" "What is your usual oxygen saturation reading?" (e.g., 95%)       N/a 11. PREGNANCY: "Is there any chance you are pregnant?" "When was your last menstrual  period?"       N/a 12. TRAVEL: "Have you traveled out of the country in the last month?" (e.g., travel history, exposures)       N/a  Answer Assessment - Initial Assessment Questions 1. LOCATION: "Where does it hurt?"       chest 2. RADIATION: "Does the pain go anywhere else?" (e.g., into neck, jaw, arms, back)     Left arm 3. ONSET: "When did the chest pain begin?" (Minutes, hours or days)      1.5 weeks 4. PATTERN: "Does the pain come and go, or has it been constant since it started?"  "Does it get worse with exertion?"      Comes and goes 5. DURATION: "How long does it last" (e.g., seconds, minutes, hours)     unknown 6. SEVERITY: "How bad is the pain?"  (e.g., Scale 1-10; mild, moderate, or severe)    - MILD (1-3): doesn't interfere with normal activities     - MODERATE (4-7): interferes with normal activities or awakens from sleep    - SEVERE (8-10): excruciating pain, unable to do any normal activities       moderate 7. CARDIAC RISK FACTORS: "Do you have any history of heart problems or risk factors for heart disease?" (e.g., angina, prior heart attack; diabetes, high blood pressure, high cholesterol, smoker, or strong family history of heart disease)     no 8. PULMONARY RISK FACTORS: "Do you have any history of lung disease?"  (e.g., blood clots in lung, asthma, emphysema, birth control pills)     no 9. CAUSE: "What do you think is causing the chest pain?"     Pt related chest pain/chest tightness to cold/coughing and  10. OTHER SYMPTOMS: "Do you have any other symptoms?" (e.g., dizziness, nausea, vomiting, sweating, fever, difficulty breathing, cough)       lightheaded, sweats, Nausea, chest pain "just uncomfortable" 4 -6/10 pain, when I breathe I feel pain/tightness, last week I had pain that radiated down left arm. Pt r/t left arm pain to pinched nerve.  Pt c/o, indigestion feeling comes & goes.  11. PREGNANCY: "Is there any chance you are pregnant?" "When was your last  menstrual period?"       N/a  Protocols used: Breathing Difficulty-A-AH, Chest Pain-A-AH

## 2023-12-12 NOTE — Telephone Encounter (Signed)
Noted

## 2023-12-12 NOTE — Telephone Encounter (Signed)
Spoke with pt. States that her symptoms have been present for 2 weeks with no improvement. Reports that she does not want to go to the ER. Pt has been scheduled to see Dr. Ermalene Searing today at 1040.

## 2023-12-19 ENCOUNTER — Ambulatory Visit
Admission: EM | Admit: 2023-12-19 | Discharge: 2023-12-19 | Disposition: A | Discharge status: Home / Self Care | Payer: Medicare HMO | Attending: Emergency Medicine | Admitting: Emergency Medicine

## 2023-12-19 ENCOUNTER — Ambulatory Visit: Payer: Self-pay | Admitting: Family

## 2023-12-19 DIAGNOSIS — M79662 Pain in left lower leg: Secondary | ICD-10-CM

## 2023-12-19 MED ORDER — MELOXICAM 15 MG PO TABS: 15.0000 mg | ORAL_TABLET | Freq: Every day | ORAL | 0 refills | Status: DC

## 2023-12-19 MED ORDER — KETOROLAC TROMETHAMINE 30 MG/ML IJ SOLN
30.0000 mg | Freq: Once | INTRAMUSCULAR | Status: AC
  Administered 2023-12-19: 30 mg via INTRAMUSCULAR

## 2023-12-19 MED ORDER — BACLOFEN 5 MG PO TABS: 5.0000 mg | ORAL_TABLET | Freq: Three times a day (TID) | ORAL | 0 refills | Status: AC

## 2023-12-19 NOTE — Discharge Instructions (Addendum)
 Based on your exam at this time I have a low suspicion for blood clot, while you have pain into the calf muscles there is no discoloration such as redness or purple hue or significant swelling, no signs of infection   Therefore we will start muscular treatment however if your symptoms worsen at any point please go to the nearest emergency department for immediate evaluation, at any point if you begin to have significant swelling or discoloration of the skin please go to the emergency department  Symptoms are not typically a side effect of medication recently taken however as you have noticed a rash to the leg you may stop use of  You have been given an injection of Toradol which helps to reduce inflammation and help with pain and ideally will improve with use    may use meloxicam once a day as needed in addition to muscle relaxant that may be used every 8 hours  You may take Tylenol or any topical medicine in addition  May use ice over the affected area in 10 to 15-minute intervals   May elevate whenever sitting and lying to help reduce swelling   May continue activity for what you may tolerate  If symptoms persist but do not worsen please follow up with PCP

## 2023-12-19 NOTE — ED Triage Notes (Signed)
 Patient to Urgent Care with complaints of left leg pain/ numbness. Difficulty bearing weight. Pain radiates into her foot. Pain is worse with pressure.   Symptoms started 2 days ago. Worse today.   Reports x3 weeks of respiratory symptoms being treated by her pcp.

## 2023-12-19 NOTE — Telephone Encounter (Signed)
 Chief Complaint: leg pain and numbness Symptoms: leg pain and numbness Frequency: for 2 days Pertinent Negatives: Patient denies CP, SOB, swelling Disposition: [] ED /[x] Urgent Care (no appt availability in office) / [] Appointment(In office/virtual)/ []  [facility]/ [] Home Care/ [] Refused Recommended Disposition /[] [facility]/ []  Follow-up with PCP Additional Notes: Pt reports 2 days of L 8/10 leg pain and numbness from the knee to the foot. Pt states she's never had this before. Seen on 2/12 for a cough, prescribed prednisone and doxycycline. Pt states she can still feel her foot but she does have some numbness. Pt denies leg swelling. Denies color change. Denies skin that is warm to the touch. Pt states crossing her legs is painful. Pt states she is unable to bear full weight on the leg. Denies CP or SOB but states she "sometimes has discomfort in my chest because I am still getting over being sick." Denies recent injury or trauma to that leg. Per protocol, RN advised pt she should be seen within 4 hours. Due to no in-office visits available today d/t weather, RN advised pt she should go to UC. Pt agreeable and says she will have her partner take her. RN assisted patient to find Urgent Cares most close to her home with short wait times. RN advised pt that if she develops SOB or CP or worsens, or if her partner feels uncomfortable driving d/t road conditions, that in that case she should call 911 and go to the ED. Pt verbalized understanding.   Copied from CRM [id number]. Topic: Clinical - Red Word Triage >> Dec 19, 2023  2:32 PM Florestine Avers wrote: Red Word that prompted transfer to Nurse Triage: Patient called in stating that she was having extreme numbness, and leg pain in her left leg. Patient thinks she may be having a reaction to her doxycycline (VIBRA-TABS) 100 MG tablet and wants to know what she should do. Reason for Disposition  [1] Thigh or calf pain AND [2] only 1 side AND  [3] present > 1 hour (Exception: Chronic unchanged pain.)  Answer Assessment - Initial Assessment Questions 1. ONSET: "When did the pain start?"      2 days ago 2. LOCATION: "Where is the pain located?"      Left leg from the knee to the ankle and into the foot 3. PAIN: "How bad is the pain?"    (Scale 1-10; or mild, moderate, severe)   -  MILD (1-3): doesn't interfere with normal activities    -  MODERATE (4-7): interferes with normal activities (e.g., work or school) or awakens from sleep, limping    -  SEVERE (8-10): excruciating pain, unable to do any normal activities, unable to walk     8-9/10 - unable to cross her legs d/t pain. "I can walk but I can't put full pressure on it, I have a limp because the pain shoots my leg" 4. WORK OR EXERCISE: "Has there been any recent work or exercise that involved this part of the body?"      No 5. CAUSE: "What do you think is causing the leg pain?"     Not sure  6. OTHER SYMPTOMS: "Do you have any other symptoms?" (e.g., chest pain, back pain, breathing difficulty, swelling, rash, fever, numbness, weakness)     Numbness (pt states she can still feel her leg), no swelling, "after I took that doxycyline my leg would itch like crazy and it would almost look like a rash but after I  put lotion on it that went away", no redness other than when it itched, no swelling, no temperature change to skin, no skin color changes to leg or toes, "some discomfort in my chest, I am still trying to get over what I was in there for a week ago." Taking doxycycline since 2/12, finished prednisone yesterday.  Protocols used: Leg Pain-A-AH

## 2023-12-19 NOTE — ED Provider Notes (Addendum)
 Renaldo Fiddler    CSN: [id number] Arrival date & time: 12/19/23  1536      History   Chief Complaint Chief Complaint  Patient presents with   Leg Pain    HPI Nicole Daniels is a 73 y.o. female.   Patient presents for evaluation left lower extremity pain and swelling beginning.  Symptoms started abruptly without injury or trauma.  Numbness only occurring in the leg crossed.  Currently taking doxycycline completed prednisone same day symptoms began.  Has not attempted treatment further/   Past Medical History:  Diagnosis Date   Abdominal pain, right lower quadrant    Acquired cyst of kidney 01/08/2012   pt denies history of    Acute sinusitis, unspecified    Adenomatous colon polyp    Allergy    Angiomyolipoma    kidney   Anxiety    OCCASIONAL   Cataract    Chest pain    Chest pain, unspecified    Depressive disorder, not elsewhere classified 01/08/2012   not currently    Diverticulosis    Dizziness and giddiness    Dysfunction of eustachian tube    H/O Spinal surgery 02/20/2017   Hemorrhoids    History of diverticulitis of colon    History of fusion of cervical spine 04/25/2018   IBS (irritable bowel syndrome)    Liver lesion 2011   Neoplasm of skin of face    malignant   OA (osteoarthritis)    knee, NECK   Obesity, unspecified    Ostium secundum type atrial septal defect    Other and unspecified hyperlipidemia    Other malaise and fatigue    Palpitations    Shortness of breath    Thyroid disease    Unspecified essential hypertension    pt states she does not have HTN   Unspecified sinusitis (chronic)    Unspecified vitamin D deficiency     Patient Active Problem List   Diagnosis Date Noted   History of colon polyps 05/15/2023   Benign paroxysmal positional vertigo due to bilateral vestibular disorder 03/21/2023   Recurrent sinusitis 02/14/2023   Class 1 obesity due to excess calories without serious comorbidity with body mass index  (BMI) of 30.0 to 30.9 in adult 11/29/2022   Post-herpetic trigeminal neuralgia 11/29/2022   B12 deficiency 02/28/2022   Cyst of right kidney 12/20/2021   Angiomyolipoma of right kidney 12/20/2021   Benign essential microscopic hematuria 12/20/2021   Hypothyroidism 11/28/2021   Osteopenia 10/06/2021   Presbycusis of both ears 09/10/2019   Postural kyphosis 09/05/2019   Tinnitus aurium, bilateral 08/29/2019   Acquired scoliosis 10/17/2018   Intradural arachnoid cyst of spine 01/31/2017   Cervical radiculopathy 12/26/2016   Vitamin D deficiency 04/08/2010   Obesity (BMI 30.0-34.9) 08/11/2008   PATENT FORAMEN OVALE 01/28/2005   Mixed hyperlipidemia 03/30/2001    Past Surgical History:  Procedure Laterality Date   ABDOMINAL HYSTERECTOMY     BREAST REDUCTION SURGERY  1990   CERVICAL DISC SURGERY     Dr.Nudleman   CHOLECYSTECTOMY     COLONOSCOPY     DILATION AND CURETTAGE OF UTERUS     EYE SURGERY Bilateral 07/30/2017   cataract sx   FLEXIBLE SIGMOIDOSCOPY  01/08/2012   Procedure: FLEXIBLE SIGMOIDOSCOPY;  Surgeon: Hart Carwin, MD;  Location: [facility];  Service: Endoscopy;  Laterality: N/A;   HEMORRHOID SURGERY     KNEE ARTHROTOMY     pt states no    LAMINECTOMY N/A 01/31/2017  Procedure: Thoracic Laminectomy and marsupialization of arachnoid cyst;  Surgeon: Shirlean Kelly, MD;  Location: [facility] OR;  Service: Neurosurgery;  Laterality: N/A;   LEFT OOPHORECTOMY  1976   NECK SURGERY N/A 03/2018   PARTIAL HYSTERECTOMY     right ovary remains   REDUCTION MAMMAPLASTY     TONSILLECTOMY  1972   UPPER GASTROINTESTINAL ENDOSCOPY      OB History   No obstetric history on file.      Home Medications    Prior to Admission medications   Medication Sig Start Date End Date Taking? Authorizing Provider  acetaminophen (TYLENOL) 325 MG tablet Take 650 mg by mouth every 6 (six) hours as needed.    [provider]  albuterol (VENTOLIN HFA) 108 (90 Base) MCG/ACT inhaler  Inhale 2 puffs into the lungs every 6 (six) hours as needed for wheezing or shortness of breath. 12/12/23   Bedsole, Amy E, MD  chlorpheniramine (ALLERGY RELIEF) 4 MG tablet Take 1 tablet (4 mg total) by mouth 2 (two) times daily as needed for allergies. 10/11/23   Mort Sawyers, FNP  Cholecalciferol (VITAMIN D3) 2000 units TABS Take 2,000 Units by mouth daily.    [provider]  Cyanocobalamin (VITAMIN B 12 PO) Take by mouth.    [provider]  doxycycline (VIBRA-TABS) 100 MG tablet Take 1 tablet (100 mg total) by mouth 2 (two) times daily. 12/12/23   Bedsole, Amy E, MD  ezetimibe (ZETIA) 10 MG tablet Take 1 tablet (10 mg total) by mouth daily. 11/07/23   Mort Sawyers, FNP  fluticasone (FLONASE) 50 MCG/ACT nasal spray Place 1 spray into both nostrils as needed for allergies.     [provider]  levothyroxine (SYNTHROID) 75 MCG tablet Take 1 tablet (75 mcg total) by mouth daily. 11/07/23   Mort Sawyers, FNP  meclizine (ANTIVERT) 12.5 MG tablet Take 1 tablet (12.5 mg total) by mouth 3 (three) times daily as needed for dizziness. 03/21/23   Mort Sawyers, FNP  OVER THE COUNTER MEDICATION Sinus and pain, take PRN.    [provider]  OVER THE COUNTER MEDICATION Pro-biotic one capsule daily.    [provider]  predniSONE (DELTASONE) 20 MG tablet 3 tabs by mouth daily x 3 days, then 2 tabs by mouth daily x 2 days then 1 tab by mouth daily x 2 days Patient not taking: Reported on 12/19/2023 12/12/23   Excell Seltzer, MD    Family History Family History  Problem Relation Age of Onset   Hypertension Mother    Diabetes Mother    Cirrhosis Father    Heart disease Father    Hypertension Sister    Diabetes Sister    Breast cancer Cousin 46 - 49   Diabetes Cousin    Heart disease Brother        x 2   Diabetes Other        aunt   Stroke Other        aunt   Colon cancer Neg Hx    Esophageal cancer Neg Hx    Stomach cancer Neg Hx    Rectal cancer Neg  Hx     Social History Social History   Tobacco Use   Smoking status: Never   Smokeless tobacco: Never  Vaping Use   Vaping status: Never Used  Substance Use Topics   Alcohol use: No   Drug use: No     Allergies   Simvastatin, Statins, Bactrim [sulfamethoxazole-trimethoprim], and Sertraline  Review of Systems Review of Systems   Physical Exam Triage Vital Signs ED Triage Vitals  Encounter Vitals Group     BP      Systolic BP Percentile      Diastolic BP Percentile      Pulse      Resp      Temp      Temp src      SpO2      Weight      Height      Head Circumference      Peak Flow      Pain Score      Pain Loc      Pain Education      Exclude from Growth Chart    No data found.  Updated Vital Signs There were no vitals taken for this visit.  Visual Acuity Right Eye Distance:   Left Eye Distance:   Bilateral Distance:    Right Eye Near:   Left Eye Near:    Bilateral Near:     Physical Exam Constitutional:      Appearance: Normal appearance.  Eyes:     Extraocular Movements: Extraocular movements intact.  Pulmonary:     Effort: Pulmonary effort is normal.  Musculoskeletal:     Comments: Tenderness present to the center in the lateral aspect of left calf, no swelling, ecchymosis or deformity,. Skin cool and soft to touch, able to bear weight, 2 + dorsalis pedis pulse  Neurological:     Mental Status: She is alert and oriented to person, place, and time. Mental status is at baseline.      UC Treatments / Results  Labs (all labs ordered are listed, but only abnormal results are displayed) Labs Reviewed - No data to display  EKG   Radiology No results found.  Procedures Procedures (including critical care time)  Medications Ordered in UC Medications - No data to display  Initial Impression / Assessment and Plan / UC Course  I have reviewed the triage vital signs and the nursing notes.  Pertinent labs & imaging results that were  available during my care of the patient were reviewed by me and considered in my medical decision making (see chart for details).  Pain of left calf   Presentation is not consistent with thrombosis, u/s not avaialbe to r/o discussed, not typical adverse effects of doxycycline or prednisone, discussed, after further discussion endorse erythematous rash present in the evening, occurs intermittently.has taken 7 days, advised discontinuation, advised supportive care through RICE, massage stretching activity as tolerated, given Toradol IM and prescribed meloxicam and baclofen. advised follow-up with PCP if symptoms continue to persist, given strict ER precautions for any new or worsening symptoms   Final Clinical Impressions(s) / UC Diagnoses   Final diagnoses:  None     Discharge Instructions      Based on your exam at this time I have a low suspicion for blood clot, while you have pain into the calf muscles there is no discoloration such as redness or purple hue or significant swelling, no signs of infection   Therefore we will start muscular treatment however if your symptoms worsen at any point please go to the nearest emergency department for immediate evaluation, at any point if you begin to have significant swelling or discoloration of the skin please go to the emergency department  Symptoms are not typically a side effect of medication recently taken   May use ice over the affected area in  10 to 15-minute intervals   May elevate whenever sitting and lying to help reduce swelling   May continue activity for what you may tolerate  If symptoms persist but do not worsen please follow up with PCP     ED Prescriptions   None    PDMP not reviewed this encounter.   Valinda Hoar, NP 12/19/23 1608    Valinda Hoar, NP 12/19/23 1610

## 2023-12-20 NOTE — Telephone Encounter (Signed)
Agree with precautions given to pt  Agree with nurse assessment in plan.  Thank you for speaking with them.

## 2023-12-21 ENCOUNTER — Ambulatory Visit: Payer: Self-pay | Admitting: Family

## 2023-12-21 ENCOUNTER — Ambulatory Visit: Payer: Medicare HMO | Admitting: Nurse Practitioner

## 2023-12-21 VITALS — BP 136/88 | HR 90 | Temp 97.7°F | Ht 65.0 in | Wt 172.0 lb

## 2023-12-21 DIAGNOSIS — M79605 Pain in left leg: Secondary | ICD-10-CM

## 2023-12-21 DIAGNOSIS — M79604 Pain in right leg: Secondary | ICD-10-CM | POA: Insufficient documentation

## 2023-12-21 LAB — BASIC METABOLIC PANEL
BUN: 18 mg/dL (ref 6–23)
CO2: 31 meq/L (ref 19–32)
Calcium: 9.1 mg/dL (ref 8.4–10.5)
Chloride: 104 meq/L (ref 96–112)
Creatinine, Ser: 0.94 mg/dL (ref 0.40–1.20)
GFR: 60.07 mL/min (ref >60.00)
Glucose, Bld: 112 mg/dL — ABNORMAL HIGH (ref 70–99)
Potassium: 3.8 meq/L (ref 3.5–5.1)
Sodium: 142 meq/L (ref 135–145)

## 2023-12-21 LAB — MAGNESIUM: Magnesium: 2.2 mg/dL (ref 1.5–2.5)

## 2023-12-21 LAB — CK: Total CK: 22 U/L (ref 7–177)

## 2023-12-21 LAB — CBC
HCT: 44.7 % (ref 36.0–46.0)
Hemoglobin: 14.6 g/dL (ref 12.0–15.0)
MCHC: 32.7 g/dL (ref 30.0–36.0)
MCV: 91.8 fL (ref 78.0–100.0)
Platelets: 412 10*3/uL — ABNORMAL HIGH (ref 150.0–400.0)
RBC: 4.87 Mil/uL (ref 3.87–5.11)
RDW: 13.7 % (ref 11.5–15.5)
WBC: 12.8 10*3/uL — ABNORMAL HIGH (ref 4.0–10.5)

## 2023-12-21 MED ORDER — METHYLPREDNISOLONE ACETATE 80 MG/ML IJ SUSP
80.0000 mg | Freq: Once | INTRAMUSCULAR | Status: AC
  Administered 2023-12-21: 40 mg via INTRAMUSCULAR

## 2023-12-21 MED ORDER — METHYLPREDNISOLONE ACETATE 40 MG/ML IJ SUSP: 40.0000 mg | Freq: Once | INTRAMUSCULAR | Status: DC

## 2023-12-21 NOTE — Patient Instructions (Addendum)
 Nice to see you today I will be in touch with the labs once I have them Follow up if you do not improve You can use tylenol as directed on the bottle along with the meloxicam You can try heat or ice

## 2023-12-21 NOTE — Telephone Encounter (Signed)
Patient evaluated in office 

## 2023-12-21 NOTE — Telephone Encounter (Signed)
  Chief Complaint: left leg pain Symptoms: leg pain, slight swelling Frequency: patient was seen at [facility] 2/19 and given injection which seemed to help- she states the pain is back and severe- limping Pertinent Negatives: Patient denies chest pain, SOB Disposition: [] ED /[] Urgent Care (no appt availability in office) / [x] Appointment(In office/virtual)/ []  [facility]/ [] Home Care/ [] Refused Recommended Disposition /[] [facility]/ []  Follow-up with PCP Additional Notes: Patient has been scheduled in office for further evaluation   Copied from CRM #[id number]. Topic: Clinical - Red Word Triage >> Dec 21, 2023  9:29 AM Pascal Lux wrote: Red Word that prompted transfer to Nurse Triage: Patient stated she was advised to go to urgent care on 12/19/23 and they give her a shot for pain in left leg and now having pain is in the right leg as well and having difficulty walking. Reason for Disposition  [1] SEVERE pain (e.g., excruciating, unable to do any normal activities) AND [2] not improved after 2 hours of pain medicine  Answer Assessment - Initial Assessment Questions 1. ONSET: "When did the pain start?"      2/17- 2/19-UC 2. LOCATION: "Where is the pain located?"      Unable to put pressure on R leg to walk, L has started hurting also 3. PAIN: "How bad is the pain?"    (Scale 1-10; or mild, moderate, severe)   -  MILD (1-3): doesn't interfere with normal activities    -  MODERATE (4-7): interferes with normal activities (e.g., work or school) or awakens from sleep, limping    -  SEVERE (8-10): excruciating pain, unable to do any normal activities, unable to walk     severe  5. CAUSE: "What do you think is causing the leg pain?"     unsure 6. OTHER SYMPTOMS: "Do you have any other symptoms?" (e.g., chest pain, back pain, breathing difficulty, swelling, rash, fever, numbness, weakness)     Rash, itching on leg several times, numbness in leg  Protocols used: Leg Pain-A-AH

## 2023-12-21 NOTE — Assessment & Plan Note (Addendum)
 Ambiguous in nature.  Will check electrolytes, CBC, CK.  Patient is not on a statin.  She can continue using meloxicam as prescribed administer Depo-Medrol 40 mg IM x 1 dose today.  Patient also try heat or ice A generous modified Wells score of 1

## 2023-12-21 NOTE — Progress Notes (Signed)
 Acute Office Visit  Subjective:     Patient ID: Nicole Daniels, female    DOB: 09/22/1951, 73 y.o.   MRN: [id number]  Chief Complaint  Patient presents with   Leg Pain    Pt complains left leg pain that started on 2/19. States that its hard to walk or stand. Pt complains of right leg pain that started today. Very sharp pain. Pain level 10.  States of numbness in left leg and tingling in the toes. OTC tylenol. Was given pain injection at [facility]. Worked for one night only.     HPI Patient is in today for leg pain with a history of hypothyroidism, cervical radiculopathy, scoliosis, osteopenia, B12 deficiency.  Of note patient was seen at urgent care on 12/19/2023 for leg pain..  According to their note low likelihood of thrombosis.  Patient had just recently finished doxycycline and prednisone.  Administer Toradol IM and prescribe meloxicam and baclofen.  Advised to follow-up with PCP if symptoms were to persist  States that symptoms started on 12/17/2023 that felt like leg cramps. States that she was dealing with them. States on the 12/19/2023 after lunch her left leg hurt so bad that she could not walk or put pressure on it.  States that the right started this morning after she went to the urgent care (12/19/2023)  Sharp pain that is all the time in the left leg. Walking and weight bearing made it wore. State sthta on the left she is having numbness/tingling. Su jectie weakness on that side   She was given an injection of Toradol that helped. The meloxicam does help some too.  Patient does not have a history of blood clots.  States no exogenous hormone use, recent travel, recent immobilization/surgeries.   Review of Systems  Constitutional:  Negative for chills and fever.  Respiratory:  Positive for cough and shortness of breath (tightness).   Cardiovascular:  Negative for chest pain.  Musculoskeletal:  Positive for joint pain.  Neurological:  Positive for tingling and weakness.         Objective:    BP 136/88   Pulse 90   Temp 97.7 F (36.5 C) (Oral)   Ht 5\' 5"  (1.651 m)   Wt 172 lb (78 kg)   SpO2 97%   BMI 28.62 kg/m  BP Readings from Last 3 Encounters:  12/21/23 136/88  12/19/23 136/84  12/12/23 134/68   Wt Readings from Last 3 Encounters:  12/21/23 172 lb (78 kg)  12/12/23 172 lb (78 kg)  10/11/23 181 lb 9.6 oz (82.4 kg)   SpO2 Readings from Last 3 Encounters:  12/21/23 97%  12/19/23 92%  12/12/23 92%      Physical Exam Vitals and nursing note reviewed.  Constitutional:      Appearance: Normal appearance.  Cardiovascular:     Rate and Rhythm: Normal rate and regular rhythm.     Pulses:          Dorsalis pedis pulses are 2+ on the right side and 2+ on the left side.     Heart sounds: Normal heart sounds.  Pulmonary:     Effort: Pulmonary effort is normal.     Breath sounds: Normal breath sounds.  Musculoskeletal:     Lumbar back: No tenderness or bony tenderness. Positive right straight leg raise test. Negative left straight leg raise test.     Right lower leg: No edema.     Left lower leg: No edema.     Comments:  34.5 cm right 37 cm left   Neurological:     Mental Status: She is alert.     Deep Tendon Reflexes:     Reflex Scores:      Patellar reflexes are 2+ on the right side and 2+ on the left side.    Comments: Bilateral lower extremity strength 5/5     No results found for any visits on 12/21/23.      Assessment & Plan:   Problem List Items Addressed This Visit       Other   Bilateral leg pain - Primary   Ambiguous in nature.  Will check electrolytes, CBC, CK.  Patient is not on a statin.  She can continue using meloxicam as prescribed administer Depo-Medrol 40 mg IM x 1 dose today.  Patient also try heat or ice A generous modified Wells score of 1      Relevant Orders   CBC   Basic metabolic panel   Magnesium   CK    Meds ordered this encounter  Medications   DISCONTD: methylPREDNISolone acetate  (DEPO-MEDROL) injection 40 mg   methylPREDNISolone acetate (DEPO-MEDROL) injection 80 mg    Return if symptoms worsen or fail to improve.  Audria Nine, NP

## 2023-12-23 ENCOUNTER — Encounter: Payer: Self-pay | Admitting: Nurse Practitioner

## 2023-12-23 ENCOUNTER — Encounter: Payer: Self-pay | Admitting: Family

## 2023-12-24 ENCOUNTER — Encounter: Payer: Self-pay | Admitting: Family

## 2023-12-24 ENCOUNTER — Emergency Department (HOSPITAL_BASED_OUTPATIENT_CLINIC_OR_DEPARTMENT_OTHER)
Admission: EM | Admit: 2023-12-24 | Discharge: 2023-12-24 | Disposition: A | Discharge status: Home / Self Care | Payer: Medicare HMO | Attending: Emergency Medicine | Admitting: Emergency Medicine

## 2023-12-24 ENCOUNTER — Emergency Department (HOSPITAL_BASED_OUTPATIENT_CLINIC_OR_DEPARTMENT_OTHER): Payer: Medicare HMO

## 2023-12-24 ENCOUNTER — Encounter (HOSPITAL_BASED_OUTPATIENT_CLINIC_OR_DEPARTMENT_OTHER): Payer: Self-pay | Admitting: Emergency Medicine

## 2023-12-24 ENCOUNTER — Ambulatory Visit (INDEPENDENT_AMBULATORY_CARE_PROVIDER_SITE_OTHER): Payer: Medicare HMO | Admitting: Family

## 2023-12-24 ENCOUNTER — Ambulatory Visit
Admission: RE | Admit: 2023-12-24 | Discharge: 2023-12-24 | Disposition: A | Discharge status: Home / Self Care | Payer: Medicare HMO | Source: Ambulatory Visit | Attending: Family | Admitting: Family

## 2023-12-24 VITALS — BP 124/78 | HR 76 | Temp 98.1°F | Ht 65.0 in | Wt 170.6 lb

## 2023-12-24 DIAGNOSIS — R0789 Other chest pain: Secondary | ICD-10-CM | POA: Insufficient documentation

## 2023-12-24 DIAGNOSIS — J0110 Acute frontal sinusitis, unspecified: Secondary | ICD-10-CM

## 2023-12-24 DIAGNOSIS — Z7901 Long term (current) use of anticoagulants: Secondary | ICD-10-CM | POA: Insufficient documentation

## 2023-12-24 DIAGNOSIS — I700 Atherosclerosis of aorta: Secondary | ICD-10-CM | POA: Insufficient documentation

## 2023-12-24 DIAGNOSIS — B379 Candidiasis, unspecified: Secondary | ICD-10-CM

## 2023-12-24 DIAGNOSIS — M7989 Other specified soft tissue disorders: Secondary | ICD-10-CM | POA: Diagnosis not present

## 2023-12-24 DIAGNOSIS — J329 Chronic sinusitis, unspecified: Secondary | ICD-10-CM

## 2023-12-24 DIAGNOSIS — I2694 Multiple subsegmental pulmonary emboli without acute cor pulmonale: Secondary | ICD-10-CM

## 2023-12-24 DIAGNOSIS — R918 Other nonspecific abnormal finding of lung field: Secondary | ICD-10-CM | POA: Diagnosis not present

## 2023-12-24 DIAGNOSIS — D72829 Elevated white blood cell count, unspecified: Secondary | ICD-10-CM | POA: Diagnosis not present

## 2023-12-24 DIAGNOSIS — I82442 Acute embolism and thrombosis of left tibial vein: Secondary | ICD-10-CM | POA: Diagnosis not present

## 2023-12-24 DIAGNOSIS — R079 Chest pain, unspecified: Secondary | ICD-10-CM | POA: Diagnosis not present

## 2023-12-24 DIAGNOSIS — M79605 Pain in left leg: Secondary | ICD-10-CM

## 2023-12-24 DIAGNOSIS — M792 Neuralgia and neuritis, unspecified: Secondary | ICD-10-CM | POA: Diagnosis not present

## 2023-12-24 DIAGNOSIS — I743 Embolism and thrombosis of arteries of the lower extremities: Secondary | ICD-10-CM | POA: Insufficient documentation

## 2023-12-24 DIAGNOSIS — R0602 Shortness of breath: Secondary | ICD-10-CM | POA: Diagnosis not present

## 2023-12-24 DIAGNOSIS — I2699 Other pulmonary embolism without acute cor pulmonale: Secondary | ICD-10-CM | POA: Diagnosis not present

## 2023-12-24 DIAGNOSIS — R600 Localized edema: Secondary | ICD-10-CM | POA: Diagnosis not present

## 2023-12-24 DIAGNOSIS — R5383 Other fatigue: Secondary | ICD-10-CM | POA: Diagnosis not present

## 2023-12-24 DIAGNOSIS — I82433 Acute embolism and thrombosis of popliteal vein, bilateral: Secondary | ICD-10-CM | POA: Diagnosis not present

## 2023-12-24 LAB — CBC WITH DIFFERENTIAL/PLATELET
Abs Immature Granulocytes: 0.05 10*3/uL (ref 0.00–0.07)
Basophils Absolute: 0 10*3/uL (ref 0.0–0.1)
Basophils Absolute: 0 10*3/uL (ref 0.0–0.1)
Basophils Relative: 0.2 % (ref 0.0–3.0)
Basophils Relative: 0 %
Eosinophils Absolute: 0.3 10*3/uL (ref 0.0–0.7)
Eosinophils Absolute: 0.3 10*3/uL (ref 0.0–0.5)
Eosinophils Relative: 3.6 % (ref 0.0–5.0)
Eosinophils Relative: 3 %
HCT: 42.4 % (ref 36.0–46.0)
HCT: 43.7 % (ref 36.0–46.0)
Hemoglobin: 13.9 g/dL (ref 12.0–15.0)
Hemoglobin: 14.3 g/dL (ref 12.0–15.0)
Immature Granulocytes: 1 %
Lymphocytes Relative: 19.1 % (ref 12.0–46.0)
Lymphocytes Relative: 24 %
Lymphs Abs: 1.5 10*3/uL (ref 0.7–4.0)
Lymphs Abs: 2.4 10*3/uL (ref 0.7–4.0)
MCH: 29.8 pg (ref 26.0–34.0)
MCHC: 32.7 g/dL (ref 30.0–36.0)
MCHC: 32.7 g/dL (ref 30.0–36.0)
MCV: 91.1 fl (ref 78.0–100.0)
MCV: 91 fL (ref 80.0–100.0)
Monocytes Absolute: 0.8 10*3/uL (ref 0.1–1.0)
Monocytes Absolute: 0.9 10*3/uL (ref 0.1–1.0)
Monocytes Relative: 9.9 % (ref 3.0–12.0)
Monocytes Relative: 10 %
Neutro Abs: 5.4 10*3/uL (ref 1.4–7.7)
Neutro Abs: 6 10*3/uL (ref 1.7–7.7)
Neutrophils Relative %: 67.2 % (ref 43.0–77.0)
Neutrophils Relative %: 62 %
Platelets: 391 10*3/uL (ref 150.0–400.0)
Platelets: 398 10*3/uL (ref 150–400)
RBC: 4.66 Mil/uL (ref 3.87–5.11)
RBC: 4.8 MIL/uL (ref 3.87–5.11)
RDW: 13.4 % (ref 11.5–15.5)
RDW: 13.2 % (ref 11.5–15.5)
WBC: 8 10*3/uL (ref 4.0–10.5)
WBC: 9.7 10*3/uL (ref 4.0–10.5)
nRBC: 0 % (ref 0.0–0.2)

## 2023-12-24 LAB — BASIC METABOLIC PANEL
Anion gap: 11 (ref 5–15)
BUN: 12 mg/dL (ref 8–23)
CO2: 27 mmol/L (ref 22–32)
Calcium: 9.3 mg/dL (ref 8.9–10.3)
Chloride: 104 mmol/L (ref 98–111)
Creatinine, Ser: 0.85 mg/dL (ref 0.44–1.00)
GFR, Estimated: >60 mL/min (ref >60)
Glucose, Bld: 90 mg/dL (ref 70–99)
Potassium: 3.6 mmol/L (ref 3.5–5.1)
Sodium: 142 mmol/L (ref 135–145)

## 2023-12-24 LAB — VITAMIN B12: Vitamin B-12: 529 pg/mL (ref 211–911)

## 2023-12-24 LAB — BRAIN NATRIURETIC PEPTIDE: B Natriuretic Peptide: 66.8 pg/mL (ref 0.0–100.0)

## 2023-12-24 LAB — TROPONIN I (HIGH SENSITIVITY): Troponin I (High Sensitivity): 5 ng/L (ref <18)

## 2023-12-24 MED ORDER — APIXABAN 2.5 MG PO TABS
10.0000 mg | ORAL_TABLET | Freq: Once | ORAL | Status: AC
  Administered 2023-12-24: 10 mg via ORAL
  Filled 2023-12-24: qty 4

## 2023-12-24 MED ORDER — TRAMADOL HCL 50 MG PO TABS: 50.0000 mg | ORAL_TABLET | Freq: Four times a day (QID) | ORAL | 0 refills | Status: AC | PRN

## 2023-12-24 MED ORDER — IOHEXOL 350 MG/ML SOLN
100.0000 mL | Freq: Once | INTRAVENOUS | Status: AC | PRN
  Administered 2023-12-24: 75 mL via INTRAVENOUS

## 2023-12-24 MED ORDER — CEFDINIR 300 MG PO CAPS: 300.0000 mg | ORAL_CAPSULE | Freq: Two times a day (BID) | ORAL | 0 refills | Status: AC

## 2023-12-24 MED ORDER — APIXABAN 5 MG PO TABS: 5.0000 mg | ORAL_TABLET | Freq: Two times a day (BID) | ORAL | 1 refills | Status: DC

## 2023-12-24 MED ORDER — MORPHINE SULFATE (PF) 2 MG/ML IV SOLN
2.0000 mg | Freq: Once | INTRAVENOUS | Status: AC
  Administered 2023-12-24: 2 mg via INTRAVENOUS
  Filled 2023-12-24: qty 1

## 2023-12-24 MED ORDER — APIXABAN (ELIQUIS) VTE STARTER PACK (10MG AND 5MG): ORAL_TABLET | ORAL | 0 refills | Status: DC

## 2023-12-24 MED ORDER — FLUCONAZOLE 150 MG PO TABS: 150.0000 mg | ORAL_TABLET | Freq: Once | ORAL | 0 refills | Status: AC

## 2023-12-24 MED ORDER — APIXABAN (ELIQUIS) EDUCATION KIT FOR DVT/PE PATIENTS: PACK | Freq: Once | Status: AC

## 2023-12-24 NOTE — Addendum Note (Signed)
 Addended by: Mort Sawyers on: 12/24/2023 01:03 PM   Modules accepted: Orders

## 2023-12-24 NOTE — ED Notes (Signed)
 ED Provider at bedside.

## 2023-12-24 NOTE — Telephone Encounter (Signed)
 Spoke with pt on the phone. She has been scheduled to see Nicole Daniels today at 0900.

## 2023-12-24 NOTE — ED Triage Notes (Signed)
 Left leg pain last Wednesday, [place] + for DVT Today some chest pain and sob.

## 2023-12-24 NOTE — Progress Notes (Signed)
 Called and spoke with patient advising her of the results for a positive DVT in the left lower extremity.  I have sent in Eliquis starter pack followed by Eliquis 5 mg twice daily.  Strict precautions if she experiences any worsening lower extremity pain, sudden onset of shortness of breath and/or chest pain to immediately go to the hospital.  She will follow-up with me in the office in 2 weeks.  We will do an anticoagulant workup to rule out blood clotting disorder.

## 2023-12-24 NOTE — Assessment & Plan Note (Addendum)
 Rx cefdinir 300 mg po bid x 10 days   Pt to continue tylenol/ibuprofen prn sinus pain. Continue with humidifier prn and steam showers recommended as well. instructed If no symptom improvement in 48 hours please f/u  Recent leukocytosis will repeat CBC today

## 2023-12-24 NOTE — Progress Notes (Signed)
 Established Patient Office Visit  Subjective:   Patient ID: Nicole Daniels, female    DOB: January 11, 1951  Age: 73 y.o. MRN: [id number]  CC:  Chief Complaint  Patient presents with   Leg Swelling    Left leg    HPI: Nicole Daniels is a 73 y.o. female presenting on 12/24/2023 for Leg Swelling (Left leg)  Started with left lower ext pain that is numb when she is crossing her legs. She has had the sx for about the last four weeks , at urgent care 2/19 was not provided with u/s as not on site. Given meloxicam baclofen and total I'm in office with some improvement but only for a few short hours.   F/u with Audria Nine, FNP 2/21, labs were ordered for bmp, cbc ck. Given depo medrol in office x 1 IM.   She states that she is still having 10/10 pain with movement or standing. She is noticing lower ext edema mainly in bil ankles. The pain is in the lef lateral side of the cat that radiates down to the calf. Will itch and then cause tingling and numbness intermittently. Does not feel it thigh up and does not complain of back pain.   Pt states over the last one month does not have much energy and feels like 'crap' was sick at the time and still with nasal congestion. No sinus pressure. Still with cough that is productive that is with white sputum. Without wheezing. She did see Dr. Ermalene Searing 2/12 and given doxycycline and prednisone, cxr was negative for acute findings. She states no real improvement of symptoms after completing course.   For allergies she is taking otc allergy relief she does have flonase but hasn't been using it lately.      ROS: Negative unless specifically indicated above in HPI.   Relevant past medical history reviewed and updated as indicated.   Allergies and medications reviewed and updated.   Current Outpatient Medications:    acetaminophen (TYLENOL) 325 MG tablet, Take 650 mg by mouth every 6 (six) hours as needed., Disp: , Rfl:    albuterol (VENTOLIN HFA) 108  (90 Base) MCG/ACT inhaler, Inhale 2 puffs into the lungs every 6 (six) hours as needed for wheezing or shortness of breath., Disp: 8 g, Rfl: 2   baclofen 5 MG TABS, Take 1 tablet (5 mg total) by mouth 3 (three) times daily for 20 doses., Disp: 20 tablet, Rfl: 0   cefdinir (OMNICEF) 300 MG capsule, Take 1 capsule (300 mg total) by mouth 2 (two) times daily for 7 days., Disp: 14 capsule, Rfl: 0   chlorpheniramine (ALLERGY RELIEF) 4 MG tablet, Take 1 tablet (4 mg total) by mouth 2 (two) times daily as needed for allergies., Disp: , Rfl:    Cholecalciferol (VITAMIN D3) 2000 units TABS, Take 2,000 Units by mouth daily., Disp: , Rfl:    Cyanocobalamin (VITAMIN B 12 PO), Take by mouth., Disp: , Rfl:    ezetimibe (ZETIA) 10 MG tablet, Take 1 tablet (10 mg total) by mouth daily., Disp: 90 tablet, Rfl: 3   fluconazole (DIFLUCAN) 150 MG tablet, Take 1 tablet (150 mg total) by mouth once for 1 dose., Disp: 1 tablet, Rfl: 0   fluticasone (FLONASE) 50 MCG/ACT nasal spray, Place 1 spray into both nostrils as needed for allergies. , Disp: , Rfl:    levothyroxine (SYNTHROID) 75 MCG tablet, Take 1 tablet (75 mcg total) by mouth daily., Disp: 90 tablet, Rfl: 3   meclizine (  ANTIVERT) 12.5 MG tablet, Take 1 tablet (12.5 mg total) by mouth 3 (three) times daily as needed for dizziness., Disp: 30 tablet, Rfl: 0   meloxicam (MOBIC) 15 MG tablet, Take 1 tablet (15 mg total) by mouth daily., Disp: 15 tablet, Rfl: 0   OVER THE COUNTER MEDICATION, Sinus and pain, take PRN., Disp: , Rfl:    OVER THE COUNTER MEDICATION, Pro-biotic one capsule daily., Disp: , Rfl:   Allergies  Allergen Reactions   Simvastatin Other (See Comments)    MYALGIAS   Statins Other (See Comments)    Has severe leg muscle aches and cramps   Bactrim [Sulfamethoxazole-Trimethoprim] Itching   Sertraline Anxiety    Objective:   BP 124/78 (BP Location: Left Arm, Patient Position: Sitting, Cuff Size: Normal)   Pulse 76   Temp 98.1 F (36.7 C)  (Temporal)   Ht 5\' 5"  (1.651 m)   Wt 170 lb 9.6 oz (77.4 kg)   SpO2 98%   BMI 28.39 kg/m    Physical Exam Constitutional:      General: She is not in acute distress.    Appearance: Normal appearance. She is normal weight. She is not ill-appearing, toxic-appearing or diaphoretic.  HENT:     Head: Normocephalic.     Right Ear: Right ear middle ear effusion: sinutisi.     Left Ear: A middle ear effusion is present. Tympanic membrane is erythematous.     Nose:     Right Sinus: Frontal sinus tenderness present.     Left Sinus: Frontal sinus tenderness present.     Mouth/Throat:     Pharynx: Postnasal drip present.     Tonsils: No tonsillar exudate.  Cardiovascular:     Rate and Rhythm: Normal rate and regular rhythm.  Pulmonary:     Effort: Pulmonary effort is normal.     Breath sounds: Normal breath sounds.  Musculoskeletal:        General: Normal range of motion.     Left lower leg: Tenderness (10/10 pain on palpation left lateral calf) present. No swelling.     Comments: Positive homans sign  Neurological:     General: No focal deficit present.     Mental Status: She is alert and oriented to person, place, and time. Mental status is at baseline.  Psychiatric:        Mood and Affect: Mood normal.        Behavior: Behavior normal.        Thought Content: Thought content normal.        Judgment: Judgment normal.     Assessment & Plan:  Pain and swelling of left lower extremity Assessment & Plan: Stat ultrasound to rule out out DVT If any sudden shortness of breath and/or chest pain patient to immediately call 911 Could consider calf sprain Okay to take Tylenol as needed  Could consider gabapentin Reviewed lab work from prior visits unremarkable   Orders: -     US Venous Img Lower Unilateral Left (DVT); Future  Leukocytosis, unspecified type -     CBC with Differential/Platelet  Neuralgia -     Vitamin B12  Other fatigue -     Vitamin B12  Candida  infection -     Fluconazole; Take 1 tablet (150 mg total) by mouth once for 1 dose.  Dispense: 1 tablet; Refill: 0  Acute non-recurrent frontal sinusitis -     Cefdinir; Take 1 capsule (300 mg total) by mouth 2 (two) times daily for 7  days.  Dispense: 14 capsule; Refill: 0  Recurrent sinusitis Assessment & Plan: Rx cefdinir 300 mg po bid x 10 days   Pt to continue tylenol/ibuprofen prn sinus pain. Continue with humidifier prn and steam showers recommended as well. instructed If no symptom improvement in 48 hours please f/u  Recent leukocytosis will repeat CBC today      Follow up plan: Return if symptoms worsen or fail to improve.  Mort Sawyers, FNP

## 2023-12-24 NOTE — ED Provider Notes (Signed)
 [facility] AT [facility] Provider Note   CSN: [id number] Arrival date & time: 12/24/23  1537     History  Chief Complaint  Patient presents with   Chest Pain   DVT    Nicole Daniels is a 73 y.o. female.  HPI   73 year old female presents emergency department for evaluation of left-sided chest pain, tightness and shortness of breath.  Was diagnosed with a left lower extremity DVT today.  Her left lower extremity has been painful and cramping for the past week, had an ultrasound today, was diagnosed with DVT, Eliquis was sent to the pharmacy but she had not started it yet.  After resting and waking up from a nap she had developed left-sided chest pain, tightness and shortness of breath.  She denies any cough, hemoptysis, back pain, flank pain.  Does admit to recent viral infection, possible RSV and was also prescribed antibiotics for what the physician felt was pneumonia.  She denies any fever at this time.  Home Medications Prior to Admission medications   Medication Sig Start Date End Date Taking? Authorizing Provider  acetaminophen (TYLENOL) 325 MG tablet Take 650 mg by mouth every 6 (six) hours as needed.    [provider]  albuterol (VENTOLIN HFA) 108 (90 Base) MCG/ACT inhaler Inhale 2 puffs into the lungs every 6 (six) hours as needed for wheezing or shortness of breath. 12/12/23   Bedsole, Amy E, MD  apixaban (ELIQUIS) 5 MG TABS tablet Take 1 tablet (5 mg total) by mouth 2 (two) times daily. 01/07/24   Mort Sawyers, FNP  APIXABAN Everlene Balls) VTE STARTER PACK (10MG  AND 5MG ) Take as directed on package: start with two-5mg  tablets twice daily for 7 days. On day 8, switch to one-5mg  tablet twice daily. 12/24/23   Mort Sawyers, FNP  baclofen 5 MG TABS Take 1 tablet (5 mg total) by mouth 3 (three) times daily for 20 doses. 12/19/23 12/26/23  Valinda Hoar, NP  cefdinir (OMNICEF) 300 MG capsule Take 1 capsule (300 mg total) by mouth 2 (two)  times daily for 7 days. 12/24/23 12/31/23  Mort Sawyers, FNP  chlorpheniramine (ALLERGY RELIEF) 4 MG tablet Take 1 tablet (4 mg total) by mouth 2 (two) times daily as needed for allergies. 10/11/23   Mort Sawyers, FNP  Cholecalciferol (VITAMIN D3) 2000 units TABS Take 2,000 Units by mouth daily.    [provider]  Cyanocobalamin (VITAMIN B 12 PO) Take by mouth.    [provider]  ezetimibe (ZETIA) 10 MG tablet Take 1 tablet (10 mg total) by mouth daily. 11/07/23   Mort Sawyers, FNP  fluconazole (DIFLUCAN) 150 MG tablet Take 1 tablet (150 mg total) by mouth once for 1 dose. 12/24/23 12/24/23  Mort Sawyers, FNP  fluticasone (FLONASE) 50 MCG/ACT nasal spray Place 1 spray into both nostrils as needed for allergies.     [provider]  levothyroxine (SYNTHROID) 75 MCG tablet Take 1 tablet (75 mcg total) by mouth daily. 11/07/23   Mort Sawyers, FNP  meclizine (ANTIVERT) 12.5 MG tablet Take 1 tablet (12.5 mg total) by mouth 3 (three) times daily as needed for dizziness. 03/21/23   Mort Sawyers, FNP  meloxicam (MOBIC) 15 MG tablet Take 1 tablet (15 mg total) by mouth daily. 12/19/23   White, Elita Boone, NP  OVER THE COUNTER MEDICATION Sinus and pain, take PRN.    [provider]  OVER THE COUNTER MEDICATION Pro-biotic one capsule daily.    [provider]      Allergies    Simvastatin, Statins, Bactrim [sulfamethoxazole-trimethoprim], and Sertraline    Review of Systems   Review of Systems  Constitutional:  Positive for fatigue. Negative for fever.  Respiratory:  Positive for chest tightness and shortness of breath.   Cardiovascular:  Positive for chest pain and leg swelling.  Gastrointestinal:  Negative for abdominal pain, diarrhea and vomiting.  Genitourinary:  Negative for flank pain.  Musculoskeletal:  Negative for back pain.       +LLE pain and cramping  Skin:  Negative for rash.  Neurological:  Negative for headaches.    Physical  Exam Updated Vital Signs BP (!) 158/91   Pulse 80   Temp 98.5 F (36.9 C)   Resp 18   SpO2 96%  Physical Exam Vitals and nursing note reviewed.  Constitutional:      General: She is not in acute distress.    Appearance: Normal appearance.  HENT:     Head: Normocephalic.     Mouth/Throat:     Mouth: Mucous membranes are moist.  Cardiovascular:     Rate and Rhythm: Normal rate.  Pulmonary:     Effort: Pulmonary effort is normal. No respiratory distress.  Abdominal:     Palpations: Abdomen is soft.     Tenderness: There is no abdominal tenderness.  Musculoskeletal:     Comments: Left lower extremity is reassuring on physical exam, equal palpable DP pulses, no significant swelling or skin changes.  Skin:    General: Skin is warm.  Neurological:     Mental Status: She is alert and oriented to person, place, and time. Mental status is at baseline.  Psychiatric:        Mood and Affect: Mood normal.     ED Results / Procedures / Treatments   Labs (all labs ordered are listed, but only abnormal results are displayed) Labs Reviewed  CBC WITH DIFFERENTIAL/PLATELET  BASIC METABOLIC PANEL  BRAIN NATRIURETIC PEPTIDE  TROPONIN I (HIGH SENSITIVITY)    EKG EKG Interpretation Date/Time:  Monday December 24 2023 15:44:04 EST Ventricular Rate:  87 PR Interval:  156 QRS Duration:  72 QT Interval:  360 QTC Calculation: 433 R Axis:   52  Text Interpretation: Normal sinus rhythm Nonspecific ST and T wave abnormality Abnormal ECG When compared with ECG of 22-Feb-2022 17:30, PREVIOUS ECG IS PRESENT Confirmed by Coralee Pesa [id number]) on 12/24/2023 4:10:00 PM  Radiology US Venous Img Lower Unilateral Left Result Date: 12/24/2023 CLINICAL DATA:  Left lower extremity pain and edema. EXAM: LEFT LOWER EXTREMITY VENOUS DOPPLER ULTRASOUND TECHNIQUE: Gray-scale sonography with graded compression, as well as color Doppler and duplex ultrasound were performed to evaluate the lower extremity  deep venous systems from the level of the common femoral vein and including the common femoral, femoral, profunda femoral, popliteal and calf veins including the posterior tibial, peroneal and gastrocnemius veins when visible. The superficial great saphenous vein was also interrogated. Spectral Doppler was utilized to evaluate flow at rest and with distal augmentation maneuvers in the common femoral, femoral and popliteal veins. COMPARISON:  None Available. FINDINGS: Contralateral Common Femoral Vein: Respiratory phasicity is normal and symmetric with the symptomatic side. No evidence of thrombus. Normal compressibility. Common Femoral Vein: No evidence of thrombus. Normal compressibility, respiratory phasicity and response to augmentation. Saphenofemoral Junction: No evidence of thrombus. Normal compressibility and flow on color Doppler imaging. Profunda Femoral Vein: No evidence of thrombus. Normal compressibility and flow on color Doppler imaging. Femoral Vein: No  evidence of thrombus. Normal compressibility, respiratory phasicity and response to augmentation. Popliteal Vein: Positive for thrombus. Nonocclusive echogenic thrombus in the distal left popliteal vein. Incomplete compressibility of the distal left popliteal vein. Calf Veins: Positive for thrombus. Echogenic thrombus involving a left posterior tibial vein in the proximal and mid calf. Thrombus involving proximal, mid and distal left peroneal veins. Other Findings:  None. IMPRESSION: Positive for deep venous thrombosis in the left lower extremity. Thrombus involving the distal left popliteal vein and left calf veins. These results will be called to the ordering clinician or representative by the Radiologist Assistant, and communication documented in the PACS or [employer]. Electronically Signed   By: Richarda Overlie M.D.   On: 12/24/2023 11:25    Procedures Procedures    Medications Ordered in ED Medications - No data to display  ED Course/  Medical Decision Making/ A&P                                 Medical Decision Making Amount and/or Complexity of Data Reviewed Labs: ordered. Radiology: ordered.  Risk Prescription drug management.   73 year old female who was diagnosed with a left lower extremity DVT by ultrasound today presents to the emergency department with left-sided chest pain.  Patient states she had not started her prescribed Eliquis today.  She was waiting for the pharmacy to open, went home and took a nap.  When she woke up she had left-sided chest pain.  Denies any shortness of breath, hemoptysis.  She still has ongoing mild swelling and pain in the left lower extremity.  Vital signs are stable on arrival, no hypoxia.  The rest of her blood work is completely normal including a troponin and a BNP.  CT PE study identifies bilateral lower lobe pulmonary emboli without any evidence of right heart strain.  Patient was given her first dose of Eliquis here.  She was also given a dose of pain medicine for symptom control.  On reevaluation she is sitting in bed, breathing easy, normal vitals.  No indication for emergent admission/IV heparin at this time.  Low risk per [facility].  Discussed with patient's the findings and otherwise reassuring results today.  Will plan for outpatient Eliquis and close follow-up with strict return to ED precautions.  Patient at this time appears safe and stable for discharge and close outpatient follow up. Discharge plan and strict return to ED precautions discussed, patient verbalizes understanding and agreement.        Final Clinical Impression(s) / ED Diagnoses Final diagnoses:  None    Rx / DC Orders ED Discharge Orders     None         Rozelle Logan, DO 12/24/23 2133

## 2023-12-24 NOTE — Discharge Instructions (Addendum)
 You have been seen and discharged from the emergency department.  You were found to have scattered small blood clots in the bottom of both lungs.  The rest of her blood work was normal.  There is no signs of heart strain, heart failure.  The treatment for this is oral Eliquis which you have been prescribed, your first dose has been given here.  You have also been prescribed pain medicine to use as needed.  Do not mix this medication with alcohol or other sedating medications. Do not drive or do heavy physical activity until you know how this medication affects you.  It may cause drowsiness.  Do not take any NSAID medication while being on Eliquis.  Follow-up with your primary provider for further evaluation and further care. Take home medications as prescribed. If you have any worsening symptoms, difficulty breathing, worsening leg swelling, coughing up blood or further concerns for your health please return to an emergency department for further evaluation.

## 2023-12-24 NOTE — Patient Instructions (Signed)
  I have sent in your order electronically for the following: ultrasound left lower extremity at this location below. Please call to schedule the appointment at your convenience  [facility] outpatient imaging center off [address], [place] [address] Phone [phone]  8-5 pm

## 2023-12-24 NOTE — Assessment & Plan Note (Addendum)
 Stat ultrasound to rule out out DVT If any sudden shortness of breath and/or chest pain patient to immediately call 911 Could consider calf sprain Okay to take Tylenol as needed  Could consider gabapentin Reviewed lab work from prior visits unremarkable

## 2023-12-25 ENCOUNTER — Telehealth: Payer: Self-pay | Admitting: Family

## 2023-12-25 ENCOUNTER — Ambulatory Visit: Payer: Self-pay | Admitting: Urology

## 2023-12-25 ENCOUNTER — Encounter: Payer: Self-pay | Admitting: Family

## 2023-12-25 ENCOUNTER — Other Ambulatory Visit: Payer: Self-pay | Admitting: Family

## 2023-12-25 NOTE — Telephone Encounter (Signed)
 Please cal pt later today went to er  yesterday and found to have pulmonary embolism. Was told to make two week f/u yesterday prior to going to ER, have her make one week f/u due to more severe situation.

## 2023-12-25 NOTE — Telephone Encounter (Signed)
 Spoke with pt. She is doing okay. Her appointment has been moved up to 12/31/23 at 0940. The doctor last night in the ED told her not to take cefdinir, she wanted to know if that was okay?

## 2023-12-29 ENCOUNTER — Emergency Department (HOSPITAL_BASED_OUTPATIENT_CLINIC_OR_DEPARTMENT_OTHER)
Admission: EM | Admit: 2023-12-29 | Discharge: 2023-12-29 | Disposition: A | Discharge status: Home / Self Care | Attending: Emergency Medicine | Admitting: Emergency Medicine

## 2023-12-29 ENCOUNTER — Other Ambulatory Visit: Payer: Self-pay

## 2023-12-29 ENCOUNTER — Encounter (HOSPITAL_BASED_OUTPATIENT_CLINIC_OR_DEPARTMENT_OTHER): Payer: Self-pay | Admitting: Emergency Medicine

## 2023-12-29 ENCOUNTER — Emergency Department (HOSPITAL_BASED_OUTPATIENT_CLINIC_OR_DEPARTMENT_OTHER): Admitting: Radiology

## 2023-12-29 DIAGNOSIS — R042 Hemoptysis: Secondary | ICD-10-CM | POA: Diagnosis not present

## 2023-12-29 DIAGNOSIS — Z7901 Long term (current) use of anticoagulants: Secondary | ICD-10-CM | POA: Insufficient documentation

## 2023-12-29 DIAGNOSIS — R0789 Other chest pain: Secondary | ICD-10-CM | POA: Diagnosis not present

## 2023-12-29 DIAGNOSIS — I2699 Other pulmonary embolism without acute cor pulmonale: Secondary | ICD-10-CM | POA: Insufficient documentation

## 2023-12-29 DIAGNOSIS — R079 Chest pain, unspecified: Secondary | ICD-10-CM | POA: Diagnosis not present

## 2023-12-29 LAB — BASIC METABOLIC PANEL
Anion gap: 9 (ref 5–15)
BUN: 11 mg/dL (ref 8–23)
CO2: 26 mmol/L (ref 22–32)
Calcium: 9.3 mg/dL (ref 8.9–10.3)
Chloride: 106 mmol/L (ref 98–111)
Creatinine, Ser: 0.78 mg/dL (ref 0.44–1.00)
GFR, Estimated: >60 mL/min (ref >60)
Glucose, Bld: 108 mg/dL — ABNORMAL HIGH (ref 70–99)
Potassium: 3.3 mmol/L — ABNORMAL LOW (ref 3.5–5.1)
Sodium: 141 mmol/L (ref 135–145)

## 2023-12-29 LAB — CBC
HCT: 42 % (ref 36.0–46.0)
Hemoglobin: 13.7 g/dL (ref 12.0–15.0)
MCH: 29.2 pg (ref 26.0–34.0)
MCHC: 32.6 g/dL (ref 30.0–36.0)
MCV: 89.6 fL (ref 80.0–100.0)
Platelets: 444 10*3/uL — ABNORMAL HIGH (ref 150–400)
RBC: 4.69 MIL/uL (ref 3.87–5.11)
RDW: 12.9 % (ref 11.5–15.5)
WBC: 7.3 10*3/uL (ref 4.0–10.5)
nRBC: 0 % (ref 0.0–0.2)

## 2023-12-29 LAB — TROPONIN I (HIGH SENSITIVITY): Troponin I (High Sensitivity): 5 ng/L (ref <18)

## 2023-12-29 MED ORDER — GUAIFENESIN-CODEINE 100-10 MG/5ML PO SOLN: 5.0000 mL | Freq: Three times a day (TID) | ORAL | 0 refills | Status: DC | PRN

## 2023-12-29 NOTE — ED Provider Notes (Signed)
 [place] EMERGENCY DEPARTMENT AT [facility] Provider Note   CSN: [id number] Arrival date & time: 12/29/23  1550     History  Chief Complaint  Patient presents with   Chest Pain    Nicole Daniels is a 73 y.o. female.  73 year old female with a history of DVT/PE diagnosed on 12/24/2023 who presents to the emergency department with hemoptysis.  Patient reports that she has had a chronic cough for the past month.  Was recently diagnosed with a DVT in her left lower extremity.  Came to the emergency department on 12/24/2023 and was diagnosed with bilateral lower lobe PE with no right heart strain.  Was started on Eliquis and discharged home.  Says she has been having intermittent chest discomfort.  No worse than when she was diagnosed on Monday.  Today she had an episode of hemoptysis that was less than a teaspoon of blood.  Happened at 9 AM and had some pink-tinged sputum but no recurrent hemoptysis.  Is feeling back to baseline now.       Home Medications Prior to Admission medications   Medication Sig Start Date End Date Taking? Authorizing Provider  guaiFENesin-codeine 100-10 MG/5ML syrup Take 5 mLs by mouth 3 (three) times daily as needed for cough. 12/29/23  Yes Rondel Baton, MD  acetaminophen (TYLENOL) 325 MG tablet Take 650 mg by mouth every 6 (six) hours as needed.    [provider]  albuterol (VENTOLIN HFA) 108 (90 Base) MCG/ACT inhaler Inhale 2 puffs into the lungs every 6 (six) hours as needed for wheezing or shortness of breath. 12/12/23   Bedsole, Amy E, MD  apixaban (ELIQUIS) 5 MG TABS tablet Take 1 tablet (5 mg total) by mouth 2 (two) times daily. 01/07/24   Mort Sawyers, FNP  APIXABAN Everlene Balls) VTE STARTER PACK (10MG  AND 5MG ) Take as directed on package: start with two-5mg  tablets twice daily for 7 days. On day 8, switch to one-5mg  tablet twice daily. 12/24/23   Mort Sawyers, FNP  cefdinir (OMNICEF) 300 MG capsule Take 1 capsule (300 mg total)  by mouth 2 (two) times daily for 7 days. 12/24/23 12/31/23  Mort Sawyers, FNP  chlorpheniramine (ALLERGY RELIEF) 4 MG tablet Take 1 tablet (4 mg total) by mouth 2 (two) times daily as needed for allergies. 10/11/23   Mort Sawyers, FNP  Cholecalciferol (VITAMIN D3) 2000 units TABS Take 2,000 Units by mouth daily.    [provider]  Cyanocobalamin (VITAMIN B 12 PO) Take by mouth.    [provider]  ezetimibe (ZETIA) 10 MG tablet Take 1 tablet (10 mg total) by mouth daily. 11/07/23   Mort Sawyers, FNP  fluticasone (FLONASE) 50 MCG/ACT nasal spray Place 1 spray into both nostrils as needed for allergies.     [provider]  levothyroxine (SYNTHROID) 75 MCG tablet Take 1 tablet (75 mcg total) by mouth daily. 11/07/23   Mort Sawyers, FNP  meclizine (ANTIVERT) 12.5 MG tablet Take 1 tablet (12.5 mg total) by mouth 3 (three) times daily as needed for dizziness. 03/21/23   Mort Sawyers, FNP  meloxicam (MOBIC) 15 MG tablet Take 1 tablet (15 mg total) by mouth daily. 12/19/23   Daniels, Elita Boone, NP  OVER THE COUNTER MEDICATION Sinus and pain, take PRN.    [provider]  OVER THE COUNTER MEDICATION Pro-biotic one capsule daily.    [provider]  traMADol (ULTRAM) 50 MG tablet Take 1 tablet (50 mg total) by mouth every 6 (six)  hours as needed for up to 5 days. 12/24/23 12/29/23  Horton, Clabe Seal, DO      Allergies    Simvastatin, Statins, Bactrim [sulfamethoxazole-trimethoprim], and Sertraline    Review of Systems   Review of Systems  Physical Exam Updated Vital Signs BP (!) 150/71   Pulse 76   Temp 97.9 F (36.6 C) (Oral)   Resp (!) 22   SpO2 97%  Physical Exam Vitals and nursing note reviewed.  Constitutional:      General: She is not in acute distress.    Appearance: She is well-developed.  HENT:     Head: Normocephalic and atraumatic.     Right Ear: External ear normal.     Left Ear: External ear normal.     Nose: Nose normal.  Eyes:      Extraocular Movements: Extraocular movements intact.     Conjunctiva/sclera: Conjunctivae normal.     Pupils: Pupils are equal, round, and reactive to light.  Cardiovascular:     Rate and Rhythm: Normal rate and regular rhythm.     Heart sounds: No murmur heard. Pulmonary:     Effort: Pulmonary effort is normal. No respiratory distress.     Breath sounds: Normal breath sounds.  Musculoskeletal:     Cervical back: Normal range of motion and neck supple.     Right lower leg: No edema.     Left lower leg: No edema.  Skin:    General: Skin is warm and dry.  Neurological:     Mental Status: She is alert and oriented to person, place, and time. Mental status is at baseline.  Psychiatric:        Mood and Affect: Mood normal.     ED Results / Procedures / Treatments   Labs (all labs ordered are listed, but only abnormal results are displayed) Labs Reviewed  BASIC METABOLIC PANEL - Abnormal; Notable for the following components:      Result Value   Potassium 3.3 (*)    Glucose, Bld 108 (*)    All other components within normal limits  CBC - Abnormal; Notable for the following components:   Platelets 444 (*)    All other components within normal limits  TROPONIN I (HIGH SENSITIVITY)    EKG EKG Interpretation Date/Time:  Saturday December 29 2023 16:06:16 EST Ventricular Rate:  89 PR Interval:  158 QRS Duration:  70 QT Interval:  334 QTC Calculation: 406 R Axis:   30  Text Interpretation: Normal sinus rhythm Normal ECG When compared with ECG of 24-Dec-2023 15:44, Non-specific change in ST segment in Inferior leads Confirmed by Vonita Moss [phone]) on 12/29/2023 4:13:08 PM  Radiology DG Chest 2 View Result Date: 12/29/2023 CLINICAL DATA:  Chest pain. EXAM: CHEST - 2 VIEW COMPARISON:  Radiograph 12/12/2023.  CT 12/24/2023 FINDINGS: The cardiomediastinal contours are unchanged. Basilar opacities on CT of no radiographic correlate. Pulmonary vasculature is normal. No consolidation,  pleural effusion, or pneumothorax. No acute osseous abnormalities are seen. IMPRESSION: No acute findings. Electronically Signed   By: Narda Rutherford M.D.   On: 12/29/2023 17:38    Procedures Procedures    Medications Ordered in ED Medications - No data to display  ED Course/ Medical Decision Making/ A&P                                 Medical Decision Making Amount and/or Complexity of Data Reviewed Labs: ordered. Radiology:  ordered.  Risk OTC drugs.   Nicole Daniels is a 73 y.o. female with comorbidities that complicate the patient evaluation including recently diagnosed DVT/PE on Eliquis who presents to the emergency department with hemoptysis  Initial Ddx:  PE, bronchitis, hemoptysis, airway compromise, anemia, bronchoalveolar hemorrhage  MDM/Course:  Patient resents emergency department with 1 month of cough, recent diagnosis of PE on Eliquis, and hemoptysis this morning.  Has had coughing the rest of the day without any recurrent hemoptysis.  On exam is overall well-appearing.  Satting well on room air.  Her lungs are clear to auscultation bilaterally.  I did review her prior CTA which did not show evidence of right heart strain or large PE.  She had a troponin today that was WNL.  Chest x-ray without acute findings.  Performed shared decision making with regards to disposition (admission for observation versus discharge home).  Patient does prefer to go home and watch her symptoms and I feel this is reasonable at this time given the fact that has been 9 hours since she had hemoptysis and has not had any recurrence.  Feel that this may be due to her subacute cough and the fact that she is now on Eliquis.  Do not feel that the clot is propagating or that she needs repeat CTA at this time.  Will have her follow-up with her primary doctor on Monday.  Return precautions discussed with her and her husband.  Sent home with cough suppressant.  This patient presents to the ED for  concern of complaints listed in HPI, this involves an extensive number of treatment options, and is a complaint that carries with it a high risk of complications and morbidity. Disposition including potential need for admission considered.   Dispo: DC Home. Return precautions discussed including, but not limited to, those listed in the AVS. Allowed pt time to ask questions which were answered fully prior to dc.  Additional history obtained from spouse Records reviewed ED Visit Notes The following labs were independently interpreted: Chemistry and show no acute abnormality I independently reviewed the following imaging with scope of interpretation limited to determining acute life threatening conditions related to emergency care: Chest x-ray and agree with the radiologist interpretation with the following exceptions: none I personally reviewed and interpreted cardiac monitoring: normal sinus rhythm  I personally reviewed and interpreted the pt's EKG: see above for interpretation  I have reviewed the patients home medications and made adjustments as needed Social Determinants of health:  Elderly  Portions of this note were generated with Scientist, clinical (histocompatibility and immunogenetics). Dictation errors may occur despite best attempts at proofreading.     Final Clinical Impression(s) / ED Diagnoses Final diagnoses:  Cough with hemoptysis  Subacute pulmonary embolism without acute cor pulmonale (HCC)    Rx / DC Orders ED Discharge Orders          Ordered    guaiFENesin-codeine 100-10 MG/5ML syrup  3 times daily PRN        12/29/23 1801              Rondel Baton, MD 12/29/23 1807

## 2023-12-29 NOTE — ED Notes (Signed)
 Pt discharged home after verbalizing understanding of discharge instructions; nad noted.

## 2023-12-29 NOTE — Discharge Instructions (Signed)
 You were seen for coughing up blood in the emergency department.   At home, please take a cough suppressant we have given you.  It can make you drowsy so do not take it before driving or operating heavy machinery.  Check your MyChart online for the results of any tests that had not resulted by the time you left the emergency department.   Follow-up with your primary doctor in 2-3 days regarding your visit.    Return immediately to the emergency department if you experience any of the following: Coughing up more blood, shortness of breath, worsening chest pain, or any other concerning symptoms.    Thank you for visiting our Emergency Department. It was a pleasure taking care of you today.

## 2023-12-29 NOTE — ED Triage Notes (Signed)
 Pt is on eliquis, which started Monday for DVT and PE. Today she was coughing and noticed some blood in her sputum and has had some chest pressure intermittently as well.

## 2023-12-31 ENCOUNTER — Telehealth: Payer: Self-pay

## 2023-12-31 ENCOUNTER — Encounter: Payer: Self-pay | Admitting: Family

## 2023-12-31 ENCOUNTER — Ambulatory Visit (INDEPENDENT_AMBULATORY_CARE_PROVIDER_SITE_OTHER): Payer: Medicare HMO | Admitting: Family

## 2023-12-31 VITALS — BP 134/76 | HR 78 | Temp 98.3°F | Ht 65.0 in | Wt 169.0 lb

## 2023-12-31 DIAGNOSIS — I82462 Acute embolism and thrombosis of left calf muscular vein: Secondary | ICD-10-CM | POA: Diagnosis not present

## 2023-12-31 DIAGNOSIS — Z7901 Long term (current) use of anticoagulants: Secondary | ICD-10-CM | POA: Diagnosis not present

## 2023-12-31 DIAGNOSIS — K921 Melena: Secondary | ICD-10-CM

## 2023-12-31 DIAGNOSIS — E876 Hypokalemia: Secondary | ICD-10-CM | POA: Diagnosis not present

## 2023-12-31 DIAGNOSIS — H6501 Acute serous otitis media, right ear: Secondary | ICD-10-CM

## 2023-12-31 LAB — CBC
HCT: 42.7 % (ref 36.0–46.0)
Hemoglobin: 14 g/dL (ref 12.0–15.0)
MCHC: 32.8 g/dL (ref 30.0–36.0)
MCV: 90.8 fl (ref 78.0–100.0)
Platelets: 455 10*3/uL — ABNORMAL HIGH (ref 150.0–400.0)
RBC: 4.71 Mil/uL (ref 3.87–5.11)
RDW: 13.3 % (ref 11.5–15.5)
WBC: 5.3 10*3/uL (ref 4.0–10.5)

## 2023-12-31 LAB — BASIC METABOLIC PANEL
BUN: 9 mg/dL (ref 6–23)
CO2: 28 meq/L (ref 19–32)
Calcium: 9.7 mg/dL (ref 8.4–10.5)
Chloride: 106 meq/L (ref 96–112)
Creatinine, Ser: 0.83 mg/dL (ref 0.40–1.20)
GFR: 69.73 mL/min (ref >60.00)
Glucose, Bld: 75 mg/dL (ref 70–99)
Potassium: 3.7 meq/L (ref 3.5–5.1)
Sodium: 143 meq/L (ref 135–145)

## 2023-12-31 LAB — IBC + FERRITIN
Ferritin: 133.3 ng/mL (ref 10.0–291.0)
Iron: 55 ug/dL (ref 42–145)
Saturation Ratios: 16.6 % — ABNORMAL LOW (ref 20.0–50.0)
TIBC: 330.4 ug/dL (ref 250.0–450.0)
Transferrin: 236 mg/dL (ref 212.0–360.0)

## 2023-12-31 NOTE — Assessment & Plan Note (Signed)
 Advised patient to continue Eliquis therapy.  Also going to do a hypercoagulability workup to rule out any blood disorders.  Pending results  Again discussed with patient red flag symptoms if any sudden or worsening shortness of breath chest pain patient is to immediately call 911.  I will obtain close follow-up with her with follow-up within the next month.  For now we will keep an eye for a therapy for a minimum of 3 months and assess further.

## 2023-12-31 NOTE — Telephone Encounter (Signed)
 Pt was seen [facility] on 12/29/23 and pt already has appt with T Dugal NP 12/31/23 at 9:40. Sendng to Hayden Pedro FNP.

## 2023-12-31 NOTE — Assessment & Plan Note (Signed)
 Patient to continue cefdinir however if there is no improvement by tomorrow then we will consider another agent.  We might have to try Levaquin 500 mg p.o. daily x 5 days.

## 2023-12-31 NOTE — Telephone Encounter (Signed)
 Saw patient already, thank you for speaking with the patient.  Please see note for further information.

## 2023-12-31 NOTE — Progress Notes (Signed)
 Established Patient Office Visit  Subjective:      CC:  Chief Complaint  Patient presents with   Medical Management of Chronic Issues    HPI: Nicole Daniels is a 73 y.o. female presenting on 12/31/2023 for Medical Management of Chronic Issues  Saw in office 2/24 by me, evaluated for left lower ext pain. U/s lower ext with   Went to ER 2/24 later that day for left sided chest pain tightness and sob. Neg troponin and bnp in office. CT PE with bil lower lobe pulmonary emboli without right heart strain. With some mild swelling and pain in the left lower extremity. Was started on her first dose of elquis and started on start pack.   She then went back to ER 3/1 because of hemoptysis, and with intermittent chest discomfort. EKG NSR CXR no acute findings. Suspected due to acute cough. Was given gua codeine cough at night time which is helping slightly.   Sinusitis: started on cefdinir, very tender to touch on right side below the ear into the neck. Also feels it behind the ear. The right side of her throat Is extremely tender to the touch.   New complaints: Has noticed some blood in her stool starting before she was on the blood thinner. She has this occur on and off. Mostly she will see it after wiping on the toilet paper but she can see it inside of the stool. Sometimes it is 'almost jelly like'. Colonoscopy 05/08/23 with several polyps.  She does have hemorrhoids but this has improved and still seeing blood in the stool.   She does state left lower ext pain improving greatly each day was 10+ and now 1/2 so she is fairing much better.      Social history:  Relevant past medical, surgical, family and social history reviewed and updated as indicated. Interim medical history since our last visit reviewed.  Allergies and medications reviewed and updated.  DATA REVIEWED: CHART IN EPIC     ROS: Negative unless specifically indicated above in HPI.    Current Outpatient  Medications:    acetaminophen (TYLENOL) 325 MG tablet, Take 650 mg by mouth every 6 (six) hours as needed., Disp: , Rfl:    albuterol (VENTOLIN HFA) 108 (90 Base) MCG/ACT inhaler, Inhale 2 puffs into the lungs every 6 (six) hours as needed for wheezing or shortness of breath., Disp: 8 g, Rfl: 2   [START ON 01/07/2024] apixaban (ELIQUIS) 5 MG TABS tablet, Take 1 tablet (5 mg total) by mouth 2 (two) times daily., Disp: 60 tablet, Rfl: 1   APIXABAN (ELIQUIS) VTE STARTER PACK (10MG  AND 5MG ), Take as directed on package: start with two-5mg  tablets twice daily for 7 days. On day 8, switch to one-5mg  tablet twice daily., Disp: 74 each, Rfl: 0   cefdinir (OMNICEF) 300 MG capsule, Take 1 capsule (300 mg total) by mouth 2 (two) times daily for 7 days., Disp: 14 capsule, Rfl: 0   chlorpheniramine (ALLERGY RELIEF) 4 MG tablet, Take 1 tablet (4 mg total) by mouth 2 (two) times daily as needed for allergies., Disp: , Rfl:    Cholecalciferol (VITAMIN D3) 2000 units TABS, Take 2,000 Units by mouth daily., Disp: , Rfl:    Cyanocobalamin (VITAMIN B 12 PO), Take by mouth., Disp: , Rfl:    ezetimibe (ZETIA) 10 MG tablet, Take 1 tablet (10 mg total) by mouth daily., Disp: 90 tablet, Rfl: 3   fluticasone (FLONASE) 50 MCG/ACT nasal spray, Place 1 spray  into both nostrils as needed for allergies. , Disp: , Rfl:    guaiFENesin-codeine 100-10 MG/5ML syrup, Take 5 mLs by mouth 3 (three) times daily as needed for cough., Disp: 120 mL, Rfl: 0   levothyroxine (SYNTHROID) 75 MCG tablet, Take 1 tablet (75 mcg total) by mouth daily., Disp: 90 tablet, Rfl: 3   meclizine (ANTIVERT) 12.5 MG tablet, Take 1 tablet (12.5 mg total) by mouth 3 (three) times daily as needed for dizziness., Disp: 30 tablet, Rfl: 0   meloxicam (MOBIC) 15 MG tablet, Take 1 tablet (15 mg total) by mouth daily., Disp: 15 tablet, Rfl: 0   OVER THE COUNTER MEDICATION, Sinus and pain, take PRN., Disp: , Rfl:    OVER THE COUNTER MEDICATION, Pro-biotic one capsule  daily., Disp: , Rfl:       Objective:    BP 134/76 (BP Location: Left Arm, Patient Position: Sitting, Cuff Size: Normal)   Pulse 78   Temp 98.3 F (36.8 C) (Temporal)   Ht 5\' 5"  (1.651 m)   Wt 169 lb (76.7 kg)   SpO2 97%   BMI 28.12 kg/m   Wt Readings from Last 3 Encounters:  12/31/23 169 lb (76.7 kg)  12/24/23 170 lb 9.6 oz (77.4 kg)  12/21/23 172 lb (78 kg)    Physical Exam Constitutional:      General: She is not in acute distress.    Appearance: Normal appearance. She is normal weight. She is not ill-appearing, toxic-appearing or diaphoretic.  HENT:     Head: Normocephalic.     Right Ear: A middle ear effusion is present. Tympanic membrane is erythematous and bulging.     Mouth/Throat:     Mouth: Mucous membranes are moist.     Dentition: Normal dentition.     Tongue: No lesions.     Pharynx: Postnasal drip present.  Cardiovascular:     Rate and Rhythm: Normal rate and regular rhythm.  Pulmonary:     Effort: Pulmonary effort is normal.     Breath sounds: Normal breath sounds. No stridor or decreased air movement. No decreased breath sounds, wheezing, rhonchi or rales.  Musculoskeletal:        General: Normal range of motion.     Right lower leg: No edema.     Left lower leg: No edema.  Skin:    Findings: No bruising, erythema or lesion.  Neurological:     General: No focal deficit present.     Mental Status: She is alert and oriented to person, place, and time. Mental status is at baseline.  Psychiatric:        Mood and Affect: Mood normal.        Behavior: Behavior normal.        Thought Content: Thought content normal.        Judgment: Judgment normal.           Assessment & Plan:  Melena -     Fecal occult blood, imunochemical; Future -     CBC -     IBC + Ferritin; Future  Hypokalemia -     Basic metabolic panel  Acute deep vein thrombosis (DVT) of calf muscle vein of left lower extremity (HCC) Assessment & Plan: Advised patient to  continue Eliquis therapy.  Also going to do a hypercoagulability workup to rule out any blood disorders.  Pending results  Again discussed with patient red flag symptoms if any sudden or worsening shortness of breath chest pain patient is to immediately  call 911.  I will obtain close follow-up with her with follow-up within the next month.  For now we will keep an eye for a therapy for a minimum of 3 months and assess further.  Orders: -     Venous Thromb. Patients on VKA  Anticoagulated -     Venous Thromb. Patients on VKA  Non-recurrent acute serous otitis media of right ear Assessment & Plan: Patient to continue cefdinir however if there is no improvement by tomorrow then we will consider another agent.  We might have to try Levaquin 500 mg p.o. daily x 5 days.      Return in about 1 month (around 01/31/2024) for follow up DVT.  Mort Sawyers, MSN, APRN, FNP-C [facility]

## 2023-12-31 NOTE — Progress Notes (Unsigned)
 01/01/2024 1:54 PM   Nicole Daniels 1951-07-01 [id number]  Referring provider: Mort Sawyers, FNP [address] [place],  [place] [address]  Urological history: 1. Renal cysts/lesions - All lesions are fairly stable and benign, Bosniak two with no malignant potential.  - She also has an incidental stable sub-centimeter right AML. Multiple smaller lesions are not able to be completely characterized 7 mm, although likely benign  2. High risk hematuria  -non- smoker -CT (2023) - 1.6 cm smooth marginated exophytic lesion in the midportion of right kidney which has increased in size. There is 6 mm fatty lesion in the upper pole of right kidney. There is 11 mm exophytic smooth marginated lesion in the posterior upper pole of left kidney which has decreased in size. There is questionable 3 mm high density nodule in the lateral margin of midportion of left kidney  -cysto (2023) - NED  Chief Complaint  Patient presents with   Follow-up   HPI: Nicole Daniels is a 73 y.o. female who presents today for yearly follow up.   Previous records reviewed.   Contrast CT (07/2023) - bilateral renal cysts seen previously are again identified, and have previously been characterized as benign. No specific imaging follow-up is required. No urinary tract calculi or obstructive uropathy. Bladder is minimally distended, with no gross abnormality.  RUS (08/2023) - Left renal 1.0 cm hypoechoic, avascular lesion arising from the anterior mid cortex. This is likely a small mildly complex cyst and appears unchanged compared with prior CT.  Multiple right renal simple cysts.  Recently seen in the ED for PE.    She is feeling well.  She is currently on antibiotic for an ear infection.  Patient denies any modifying or aggravating factors.  Patient denies any recent UTI's, gross hematuria, dysuria or suprapubic/flank pain.  Patient denies any fevers, chills, nausea or vomiting.    UA yellow  clear, specific every 1.020, 2+ heme, pH 6.5, 1+ leukocyte, 6-10 WBCs, 3-10 RBCs, 0-10 epithelial cells, mucus threads present and moderate bacteria.  PMH: Past Medical History:  Diagnosis Date   Abdominal pain, right lower quadrant    Acquired cyst of kidney 01/08/2012   pt denies history of    Acute sinusitis, unspecified    Adenomatous colon polyp    Allergy    Angiomyolipoma    kidney   Anxiety    OCCASIONAL   Cataract    Chest pain    Chest pain, unspecified    Depressive disorder, not elsewhere classified 01/08/2012   not currently    Diverticulosis    Dizziness and giddiness    Dysfunction of eustachian tube    H/O Spinal surgery 02/20/2017   Hemorrhoids    History of diverticulitis of colon    History of fusion of cervical spine 04/25/2018   IBS (irritable bowel syndrome)    Liver lesion 2011   Neoplasm of skin of face    malignant   OA (osteoarthritis)    knee, NECK   Obesity, unspecified    Ostium secundum type atrial septal defect    Other and unspecified hyperlipidemia    Other malaise and fatigue    Palpitations    Shortness of breath    Thyroid disease    Unspecified essential hypertension    pt states she does not have HTN   Unspecified sinusitis (chronic)    Unspecified vitamin D deficiency     Surgical History: Past Surgical History:  Procedure Laterality  Date   ABDOMINAL HYSTERECTOMY     BREAST REDUCTION SURGERY  1990   CERVICAL DISC SURGERY     Dr.Nudleman   CHOLECYSTECTOMY     COLONOSCOPY     DILATION AND CURETTAGE OF UTERUS     EYE SURGERY Bilateral 07/30/2017   cataract sx   FLEXIBLE SIGMOIDOSCOPY  01/08/2012   Procedure: FLEXIBLE SIGMOIDOSCOPY;  Surgeon: Hart Carwin, MD;  Location: [facility];  Service: Endoscopy;  Laterality: N/A;   HEMORRHOID SURGERY     KNEE ARTHROTOMY     pt states no    LAMINECTOMY N/A 01/31/2017   Procedure: Thoracic Laminectomy and marsupialization of arachnoid cyst;  Surgeon: Shirlean Kelly, MD;   Location: [facility] OR;  Service: Neurosurgery;  Laterality: N/A;   LEFT OOPHORECTOMY  1976   NECK SURGERY N/A 03/2018   PARTIAL HYSTERECTOMY     right ovary remains   REDUCTION MAMMAPLASTY     TONSILLECTOMY  1972   UPPER GASTROINTESTINAL ENDOSCOPY      Home Medications:  Allergies as of 01/01/2024       Reactions   Simvastatin Other (See Comments)   MYALGIAS   Statins Other (See Comments)   Has severe leg muscle aches and cramps   Bactrim [sulfamethoxazole-trimethoprim] Itching   Sertraline Anxiety        Medication List        Accurate as of January 01, 2024  1:54 PM. If you have any questions, ask your nurse or doctor.          acetaminophen 325 MG tablet Commonly known as: TYLENOL Take 650 mg by mouth every 6 (six) hours as needed.   albuterol 108 (90 Base) MCG/ACT inhaler Commonly known as: VENTOLIN HFA Inhale 2 puffs into the lungs every 6 (six) hours as needed for wheezing or shortness of breath.   Apixaban Starter Pack (10mg  and 5mg ) Commonly known as: ELIQUIS STARTER PACK Take as directed on package: start with two-5mg  tablets twice daily for 7 days. On day 8, switch to one-5mg  tablet twice daily.   apixaban 5 MG Tabs tablet Commonly known as: ELIQUIS Take 1 tablet (5 mg total) by mouth 2 (two) times daily. Start taking on: January 07, 2024   chlorpheniramine 4 MG tablet Commonly known as: Allergy Relief Take 1 tablet (4 mg total) by mouth 2 (two) times daily as needed for allergies.   ezetimibe 10 MG tablet Commonly known as: ZETIA Take 1 tablet (10 mg total) by mouth daily.   fluticasone 50 MCG/ACT nasal spray Commonly known as: FLONASE Place 1 spray into both nostrils as needed for allergies.   guaiFENesin-codeine 100-10 MG/5ML syrup Take 5 mLs by mouth 3 (three) times daily as needed for cough.   levothyroxine 75 MCG tablet Commonly known as: SYNTHROID Take 1 tablet (75 mcg total) by mouth daily.   meclizine 12.5 MG tablet Commonly known as:  ANTIVERT Take 1 tablet (12.5 mg total) by mouth 3 (three) times daily as needed for dizziness.   meloxicam 15 MG tablet Commonly known as: Mobic Take 1 tablet (15 mg total) by mouth daily.   OVER THE COUNTER MEDICATION Sinus and pain, take PRN.   OVER THE COUNTER MEDICATION Pro-biotic one capsule daily.   VITAMIN B 12 PO Take by mouth.   Vitamin D3 50 MCG (2000 UT) Tabs Take 2,000 Units by mouth daily.        Allergies:  Allergies  Allergen Reactions   Simvastatin Other (See Comments)    MYALGIAS  Statins Other (See Comments)    Has severe leg muscle aches and cramps   Bactrim [Sulfamethoxazole-Trimethoprim] Itching   Sertraline Anxiety    Family History: Family History  Problem Relation Age of Onset   Hypertension Mother    Diabetes Mother    Cirrhosis Father    Heart disease Father    Hypertension Sister    Diabetes Sister    Breast cancer Cousin 54 - 24   Diabetes Cousin    Heart disease Brother        x 2   Diabetes Other        aunt   Stroke Other        aunt   Colon cancer Neg Hx    Esophageal cancer Neg Hx    Stomach cancer Neg Hx    Rectal cancer Neg Hx     Social History:  reports that she has never smoked. She has never used smokeless tobacco. She reports that she does not drink alcohol and does not use drugs.  ROS: Pertinent ROS in HPI  Physical Exam: BP (!) 142/80   Pulse 79   Ht 5\' 5"  (1.651 m)   Wt 169 lb (76.7 kg)   BMI 28.12 kg/m   Constitutional:  Well nourished. Alert and oriented, No acute distress. HEENT: [place] AT, moist mucus membranes.  Trachea midline, no masses. Cardiovascular: No clubbing, cyanosis, or edema. Respiratory: Normal respiratory effort, no increased work of breathing. Neurologic: Grossly intact, no focal deficits, moving all 4 extremities. Psychiatric: Normal mood and affect.    Laboratory Data: Lab Results  Component Value Date   WBC 5.3 12/31/2023   HGB 14.0 12/31/2023   HCT 42.7 12/31/2023   MCV  90.8 12/31/2023   PLT 455.0 (H) 12/31/2023    Lab Results  Component Value Date   CREATININE 0.83 12/31/2023    Lab Results  Component Value Date   HGBA1C 5.6 03/21/2023    Lab Results  Component Value Date   TSH 1.30 10/11/2023       Component Value Date/Time   CHOL 196 10/11/2023 1134   HDL 46.50 10/11/2023 1134   CHOLHDL 4 10/11/2023 1134   VLDL 51.2 (H) 10/11/2023 1134   LDLCALC 98 10/11/2023 1134   Urinalysis See EPIC and HPI  I have reviewed the labs.   Pertinent Imaging:  Narrative & Impression  : PROCEDURE: US RENAL   HISTORY: Patient is a 73 y/o  F with renal mass.   COMPARISON: CT abdomen/pelvis 07/19/2023, 12/07/2022, U/S abdomen 12/22/2009.   TECHNIQUE: Two-dimensional grayscale and color Doppler ultrasound of the kidneys was performed.   FINDINGS: The urinary bladder demonstrates normal anechoic echogenicity without wall thickening. Color Doppler was not utilized for evaluation of the bilateral ureteral jets.   The right kidney measures 12.3 cm. Renal cortical echotexture is normal. There is no hydronephrosis. There are no stones. There are multiple cysts, largest measuring 1.7 cm.   The left kidney measures 11.1 cm. Renal cortical echotexture is normal. There is no hydronephrosis. There are no stones. There is a 1.0 x 0.5 x 0.8 cm hypoechoic, avascular lesion visualized arising from the anterior mid cortex.   IMPRESSION: 1. Left renal 1.0 cm hypoechoic, avascular lesion arising from the anterior mid cortex. This is likely a small mildly complex cyst and appears unchanged compared with prior CT.   2.  Multiple right renal simple cysts.   Thank you for allowing [place] to assist in the care of this patient.  Electronically Signed   By: Lestine Box M.D.   On: 09/29/2023 09:12   CLINICAL DATA:  Left lower quadrant abdominal pain, blood in stool   EXAM: CT ABDOMEN AND PELVIS WITH CONTRAST   TECHNIQUE: Multidetector CT imaging of  the abdomen and pelvis was performed using the standard protocol following bolus administration of intravenous contrast.   RADIATION DOSE REDUCTION: This exam was performed according to the departmental dose-optimization program which includes automated exposure control, adjustment of the mA and/or kV according to patient size and/or use of iterative reconstruction technique.   CONTRAST:  OMNIPAQUE IOHEXOL 300 MG/ML  SOLN   COMPARISON:  12/07/2022   FINDINGS: Lower chest: No acute pleural or parenchymal lung disease.   Hepatobiliary: Mild diffuse hepatic steatosis. No focal liver abnormality. Prior cholecystectomy.   Pancreas: Unremarkable. No pancreatic ductal dilatation or surrounding inflammatory changes.   Spleen: Normal in size without focal abnormality.   Adrenals/Urinary Tract: The bilateral renal cysts seen previously are again identified, and have previously been characterized as benign. No specific imaging follow-up is required. No urinary tract calculi or obstructive uropathy. Bladder is minimally distended, with no gross abnormality. The adrenals are unremarkable.   Stomach/Bowel: No bowel obstruction or ileus. The appendix is not identified and may be surgically absent. There is diverticulosis of the descending and sigmoid colon, with no evidence of bowel wall thickening or inflammatory change to suggest acute diverticulitis.   Vascular/Lymphatic: Aortic atherosclerosis. No enlarged abdominal or pelvic lymph nodes.   Reproductive: Status post hysterectomy. No adnexal masses.   Other: No free fluid or free intraperitoneal gas. No abdominal wall hernia.   Musculoskeletal: No acute or destructive bony abnormalities. Reconstructed images demonstrate no additional findings.   IMPRESSION: 1. Distal colonic diverticulosis without evidence of acute diverticulitis. 2. Hepatic steatosis. 3.  Aortic Atherosclerosis (ICD10-I70.0).     Electronically  Signed   By: Sharlet Salina M.D.   On: 07/19/2023 18:43 I have independently reviewed the films.  See HPI.   Assessment & Plan:    1. Renal cysts/lesions -all lesions fairly stable and benign  2. High risk hematuria -non- smoker -work up 2023 - no worrisome GU findings -no reports of gross heme -UA w/ micro heme - will send for culture  -will continue to monitor to ensure it does not persist  3. Vaginal atrophy -not bothersome    Return in about 2 months (around 03/02/2024) for Repeat UA and office visit  .  These notes generated with voice recognition software. I apologize for typographical errors.  Cloretta Ned  [facility] [address]  [address] [place], [place] [address] [phone]

## 2023-12-31 NOTE — Telephone Encounter (Signed)
 Nicole Daniels

## 2024-01-01 ENCOUNTER — Encounter: Payer: Self-pay | Admitting: Family

## 2024-01-01 ENCOUNTER — Ambulatory Visit: Payer: Medicare HMO | Admitting: Urology

## 2024-01-01 ENCOUNTER — Encounter: Payer: Self-pay | Admitting: Urology

## 2024-01-01 VITALS — BP 142/80 | HR 79 | Ht 65.0 in | Wt 169.0 lb

## 2024-01-01 DIAGNOSIS — R319 Hematuria, unspecified: Secondary | ICD-10-CM

## 2024-01-01 DIAGNOSIS — N289 Disorder of kidney and ureter, unspecified: Secondary | ICD-10-CM | POA: Diagnosis not present

## 2024-01-01 DIAGNOSIS — N952 Postmenopausal atrophic vaginitis: Secondary | ICD-10-CM

## 2024-01-01 LAB — URINALYSIS, COMPLETE
Bilirubin, UA: NEGATIVE
Glucose, UA: NEGATIVE
Ketones, UA: NEGATIVE
Nitrite, UA: NEGATIVE
Protein,UA: NEGATIVE
Specific Gravity, UA: 1.02 (ref 1.005–1.030)
Urobilinogen, Ur: 0.2 mg/dL (ref 0.2–1.0)
pH, UA: 6.5 (ref 5.0–7.5)

## 2024-01-01 LAB — MICROSCOPIC EXAMINATION

## 2024-01-01 MED ORDER — LEVOFLOXACIN 500 MG PO TABS: 500.0000 mg | ORAL_TABLET | Freq: Every day | ORAL | 0 refills | Status: AC

## 2024-01-04 LAB — CULTURE, URINE COMPREHENSIVE

## 2024-01-05 ENCOUNTER — Other Ambulatory Visit: Payer: Self-pay

## 2024-01-05 ENCOUNTER — Encounter (HOSPITAL_BASED_OUTPATIENT_CLINIC_OR_DEPARTMENT_OTHER): Payer: Self-pay | Admitting: Emergency Medicine

## 2024-01-05 ENCOUNTER — Emergency Department (HOSPITAL_BASED_OUTPATIENT_CLINIC_OR_DEPARTMENT_OTHER)
Admission: EM | Admit: 2024-01-05 | Discharge: 2024-01-05 | Disposition: A | Discharge status: Home / Self Care | Attending: Emergency Medicine | Admitting: Emergency Medicine

## 2024-01-05 DIAGNOSIS — R0789 Other chest pain: Secondary | ICD-10-CM | POA: Diagnosis not present

## 2024-01-05 DIAGNOSIS — Z7901 Long term (current) use of anticoagulants: Secondary | ICD-10-CM | POA: Insufficient documentation

## 2024-01-05 DIAGNOSIS — M79652 Pain in left thigh: Secondary | ICD-10-CM | POA: Insufficient documentation

## 2024-01-05 DIAGNOSIS — R229 Localized swelling, mass and lump, unspecified: Secondary | ICD-10-CM

## 2024-01-05 DIAGNOSIS — M7989 Other specified soft tissue disorders: Secondary | ICD-10-CM | POA: Diagnosis not present

## 2024-01-05 NOTE — ED Notes (Signed)
 Patient discharged to home after review of written discharge instructions; patient to follow up with PCP, return for new or worsening symptoms.  Understanding verbalized and patient discharged alert, oriented and self-ambulatory.

## 2024-01-05 NOTE — Discharge Instructions (Signed)
 You were seen in the emergency department today for swelling and pain to the skin on the left thigh.  This does not appear to be a severe infection or new clot.  This may be an allergic reaction from plant or animal.  It also may be some swelling from being on your blood thinner where you may have hit or injured the area and had some bleeding under the skin that you were unaware of.  Please keep the area compressed and elevated.  Return if there is any increasing redness, increased swelling, fever, or other new symptoms.

## 2024-01-05 NOTE — ED Provider Notes (Signed)
 [place] EMERGENCY DEPARTMENT AT [facility] Provider Note   CSN: [id number] Arrival date & time: 01/05/24  7829     History  Chief Complaint  Patient presents with   Leg Swelling    Nicole Daniels is a 73 y.o. female.  HPI 73 year old female recently diagnosed with DVT and PE and now on Eliquis.  She states that yesterday she began having some pain in the left lower lateral thigh.  She noticed some swelling approximately 10 cm across with some itching.  This has since resolved down to an area about 2 cm across which is somewhat darker.  It is tender and she is having some pain.  She has not noticed any trauma or bug or exposure to plants.  The calf itself has remained stable.  She did notice some tingling in her foot.  She has not had any other dyspnea.  She is taking her Eliquis as prescribed.  She has not had any fever or chills.  She is not wearing any compression hose but is keeping her legs elevated except when walking.  She has not noted any increased chest pain or shortness of breath     Home Medications Prior to Admission medications   Medication Sig Start Date End Date Taking? Authorizing Provider  acetaminophen (TYLENOL) 325 MG tablet Take 650 mg by mouth every 6 (six) hours as needed.    [provider]  albuterol (VENTOLIN HFA) 108 (90 Base) MCG/ACT inhaler Inhale 2 puffs into the lungs every 6 (six) hours as needed for wheezing or shortness of breath. 12/12/23   Bedsole, Amy E, MD  apixaban (ELIQUIS) 5 MG TABS tablet Take 1 tablet (5 mg total) by mouth 2 (two) times daily. 01/07/24   Mort Sawyers, FNP  APIXABAN Everlene Balls) VTE STARTER PACK (10MG  AND 5MG ) Take as directed on package: start with two-5mg  tablets twice daily for 7 days. On day 8, switch to one-5mg  tablet twice daily. 12/24/23   Mort Sawyers, FNP  chlorpheniramine (ALLERGY RELIEF) 4 MG tablet Take 1 tablet (4 mg total) by mouth 2 (two) times daily as needed for allergies. 10/11/23    Mort Sawyers, FNP  Cholecalciferol (VITAMIN D3) 2000 units TABS Take 2,000 Units by mouth daily.    [provider]  Cyanocobalamin (VITAMIN B 12 PO) Take by mouth.    [provider]  ezetimibe (ZETIA) 10 MG tablet Take 1 tablet (10 mg total) by mouth daily. 11/07/23   Mort Sawyers, FNP  fluticasone (FLONASE) 50 MCG/ACT nasal spray Place 1 spray into both nostrils as needed for allergies.     [provider]  guaiFENesin-codeine 100-10 MG/5ML syrup Take 5 mLs by mouth 3 (three) times daily as needed for cough. 12/29/23   Rondel Baton, MD  levofloxacin (LEVAQUIN) 500 MG tablet Take 1 tablet (500 mg total) by mouth daily for 7 days. 01/01/24 01/08/24  Mort Sawyers, FNP  levothyroxine (SYNTHROID) 75 MCG tablet Take 1 tablet (75 mcg total) by mouth daily. 11/07/23   Mort Sawyers, FNP  meclizine (ANTIVERT) 12.5 MG tablet Take 1 tablet (12.5 mg total) by mouth 3 (three) times daily as needed for dizziness. 03/21/23   Mort Sawyers, FNP  meloxicam (MOBIC) 15 MG tablet Take 1 tablet (15 mg total) by mouth daily. 12/19/23   White, Elita Boone, NP  OVER THE COUNTER MEDICATION Sinus and pain, take PRN.    [provider]  OVER THE COUNTER MEDICATION Pro-biotic one capsule daily.    [provider]      Allergies    Simvastatin, Statins, Bactrim [sulfamethoxazole-trimethoprim], and Sertraline    Review of Systems   Review of Systems  Physical Exam Updated Vital Signs BP (!) 147/57   Pulse 78   Temp 98 F (36.7 C) (Oral)   Resp (!) 26   Ht 1.651 m (5\' 5" )   Wt 76.7 kg   SpO2 97%   BMI 28.12 kg/m  Physical Exam Vitals reviewed.  Constitutional:      Appearance: She is normal weight.  HENT:     Head: Normocephalic.     Right Ear: External ear normal.     Left Ear: External ear normal.     Nose: Nose normal.     Mouth/Throat:     Pharynx: Oropharynx is clear.  Eyes:     Pupils: Pupils are equal, round, and reactive to light.   Cardiovascular:     Rate and Rhythm: Normal rate.     Pulses: Normal pulses.  Pulmonary:     Effort: Pulmonary effort is normal.  Musculoskeletal:        General: Normal range of motion.     Cervical back: Normal range of motion.     Comments: Left leg with good pulses in feet No obvious swelling to lower extremity and no pitting edema There is a 3 cm area to the lateral aspect of the left lower thigh on the anterior lateral aspect that is somewhat darker than the surrounding skin there is no obvious induration or erythema.  There is some mild tenderness to palpation and some possible swelling below this.  Skin:    General: Skin is warm.     Capillary Refill: Capillary refill takes less than 2 seconds.  Neurological:     General: No focal deficit present.     Mental Status: She is alert.  Psychiatric:        Mood and Affect: Mood normal.     ED Results / Procedures / Treatments   Labs (all labs ordered are listed, but only abnormal results are displayed) Labs Reviewed - No data to display  EKG EKG Interpretation Date/Time:  Saturday January 05 2024 08:50:30 EST Ventricular Rate:  77 PR Interval:  174 QRS Duration:  87 QT Interval:  358 QTC Calculation: 406 R Axis:   43  Text Interpretation: Sinus rhythm No significant change since last tracing Confirmed by Margarita Grizzle [phone]) on 01/05/2024 9:10:16 AM  Radiology No results found.  Procedures Procedures    Medications Ordered in ED Medications - No data to display  ED Course/ Medical Decision Making/ A&P                                 Medical Decision Making  72 year old female history of DVT started on Eliquis presents today with some pain and swelling to the lateral aspect of the left thigh.  This is a small area.  There is no surrounding erythema or warmth Differential diagnosis includes but is not limited to DVT, excoriation from bug bite, trauma with hematoma, cellulitis or other infection. This does not  follow expected pattern of DVT.  There is associated itching and some pain.  She does not have any known trauma.  I see no evidence of redness, deep infection, fever and doubt infection or cellulitis.  I feel this is more likely hematoma or allergic reaction possibly to bug bite or other mechanism.  Patient is advised to wear compression hose and keep leg elevated.  We have discussed that if symptoms change she may need further evaluation but currently she appears stable for discharge.  She is taking her anticoagulation as prescribed.  We have discussed risks of anticoagulation and bleeding.  We discussed that she must continue to take this medication as prescribed and has not missed any doses. Discussed return precautions need for follow-up and she voices understanding.       Final Clinical Impression(s) / ED Diagnoses Final diagnoses:  Localized skin mass, lump, or swelling    Rx / DC Orders ED Discharge Orders     None         Margarita Grizzle, MD 01/05/24 [phone]

## 2024-01-05 NOTE — ED Triage Notes (Signed)
 Pt caox4, ambulatory, NAD reporting she was recently dx with DVT and started on Eliquis. Pt reports she noticed a red spot and swelling on her L thigh last night further stating her only s/s with DVT initially was leg pain. Pt c/o chest tightness that started this morning stating "I think it is just anxiety."

## 2024-01-07 ENCOUNTER — Telehealth: Payer: Self-pay

## 2024-01-07 ENCOUNTER — Ambulatory Visit: Payer: Medicare HMO | Admitting: Family

## 2024-01-07 ENCOUNTER — Encounter: Payer: Self-pay | Admitting: Family

## 2024-01-07 NOTE — Telephone Encounter (Signed)
 Nicole Daniels

## 2024-01-07 NOTE — Telephone Encounter (Signed)
 Perr chart review note pt went to [facility] ED on 01/05/24; sending  note T Dugal FNP.

## 2024-01-08 ENCOUNTER — Other Ambulatory Visit: Payer: Self-pay

## 2024-01-08 DIAGNOSIS — K921 Melena: Secondary | ICD-10-CM

## 2024-01-08 LAB — FECAL OCCULT BLOOD, IMMUNOCHEMICAL: Fecal Occult Bld: POSITIVE — AB

## 2024-01-09 ENCOUNTER — Encounter: Payer: Self-pay | Admitting: Family

## 2024-01-10 ENCOUNTER — Encounter: Payer: Self-pay | Admitting: Family

## 2024-01-10 NOTE — Telephone Encounter (Signed)
 Can one your give pt a call and see if she can come in tomorrow 3/14 at 940? I will squeeze her. Giving verbal orders this is ok.

## 2024-01-10 NOTE — Telephone Encounter (Signed)
 Called and scheduled patient in 3/14 9:40 am slot.

## 2024-01-11 ENCOUNTER — Emergency Department (HOSPITAL_BASED_OUTPATIENT_CLINIC_OR_DEPARTMENT_OTHER)

## 2024-01-11 ENCOUNTER — Other Ambulatory Visit: Payer: Self-pay

## 2024-01-11 ENCOUNTER — Emergency Department (HOSPITAL_BASED_OUTPATIENT_CLINIC_OR_DEPARTMENT_OTHER)
Admission: EM | Admit: 2024-01-11 | Discharge: 2024-01-11 | Disposition: A | Discharge status: Home / Self Care | Attending: Emergency Medicine | Admitting: Emergency Medicine

## 2024-01-11 ENCOUNTER — Ambulatory Visit (INDEPENDENT_AMBULATORY_CARE_PROVIDER_SITE_OTHER): Admitting: Family

## 2024-01-11 ENCOUNTER — Encounter (HOSPITAL_BASED_OUTPATIENT_CLINIC_OR_DEPARTMENT_OTHER): Payer: Self-pay

## 2024-01-11 VITALS — BP 124/84 | HR 88 | Temp 98.0°F | Ht 65.0 in | Wt 170.4 lb

## 2024-01-11 DIAGNOSIS — L299 Pruritus, unspecified: Secondary | ICD-10-CM | POA: Diagnosis not present

## 2024-01-11 DIAGNOSIS — R1031 Right lower quadrant pain: Secondary | ICD-10-CM | POA: Insufficient documentation

## 2024-01-11 DIAGNOSIS — K625 Hemorrhage of anus and rectum: Secondary | ICD-10-CM | POA: Diagnosis not present

## 2024-01-11 DIAGNOSIS — Z7901 Long term (current) use of anticoagulants: Secondary | ICD-10-CM | POA: Diagnosis not present

## 2024-01-11 DIAGNOSIS — R109 Unspecified abdominal pain: Secondary | ICD-10-CM | POA: Insufficient documentation

## 2024-01-11 DIAGNOSIS — K921 Melena: Secondary | ICD-10-CM

## 2024-01-11 DIAGNOSIS — K5792 Diverticulitis of intestine, part unspecified, without perforation or abscess without bleeding: Secondary | ICD-10-CM | POA: Diagnosis not present

## 2024-01-11 DIAGNOSIS — R10A1 Flank pain, right side: Secondary | ICD-10-CM | POA: Insufficient documentation

## 2024-01-11 DIAGNOSIS — R1032 Left lower quadrant pain: Secondary | ICD-10-CM | POA: Diagnosis not present

## 2024-01-11 DIAGNOSIS — K4090 Unilateral inguinal hernia, without obstruction or gangrene, not specified as recurrent: Secondary | ICD-10-CM | POA: Diagnosis not present

## 2024-01-11 DIAGNOSIS — I82462 Acute embolism and thrombosis of left calf muscular vein: Secondary | ICD-10-CM | POA: Diagnosis not present

## 2024-01-11 LAB — URINALYSIS, ROUTINE W REFLEX MICROSCOPIC
Bacteria, UA: NONE SEEN
Bilirubin Urine: NEGATIVE
Glucose, UA: NEGATIVE mg/dL
Hgb urine dipstick: NEGATIVE
Ketones, ur: NEGATIVE mg/dL
Leukocytes,Ua: NEGATIVE
Nitrite: NEGATIVE
Protein, ur: NEGATIVE mg/dL
Specific Gravity, Urine: 1.039 — ABNORMAL HIGH (ref 1.005–1.030)
pH: 7 (ref 5.0–8.0)

## 2024-01-11 LAB — COMPREHENSIVE METABOLIC PANEL
ALT: 14 U/L (ref 0–44)
AST: 17 U/L (ref 15–41)
Albumin: 3.9 g/dL (ref 3.5–5.0)
Alkaline Phosphatase: 85 U/L (ref 38–126)
Anion gap: 8 (ref 5–15)
BUN: 10 mg/dL (ref 8–23)
CO2: 27 mmol/L (ref 22–32)
Calcium: 9.9 mg/dL (ref 8.9–10.3)
Chloride: 105 mmol/L (ref 98–111)
Creatinine, Ser: 0.89 mg/dL (ref 0.44–1.00)
GFR, Estimated: >60 mL/min (ref >60)
Glucose, Bld: 94 mg/dL (ref 70–99)
Potassium: 3.2 mmol/L — ABNORMAL LOW (ref 3.5–5.1)
Sodium: 140 mmol/L (ref 135–145)
Total Bilirubin: 0.6 mg/dL (ref 0.0–1.2)
Total Protein: 6.9 g/dL (ref 6.5–8.1)

## 2024-01-11 LAB — CBC
HCT: 42.5 % (ref 36.0–46.0)
Hemoglobin: 14 g/dL (ref 12.0–15.0)
MCH: 29.3 pg (ref 26.0–34.0)
MCHC: 32.9 g/dL (ref 30.0–36.0)
MCV: 88.9 fL (ref 80.0–100.0)
Platelets: 419 10*3/uL — ABNORMAL HIGH (ref 150–400)
RBC: 4.78 MIL/uL (ref 3.87–5.11)
RDW: 13.3 % (ref 11.5–15.5)
WBC: 6.2 10*3/uL (ref 4.0–10.5)
nRBC: 0 % (ref 0.0–0.2)

## 2024-01-11 MED ORDER — ONDANSETRON HCL 4 MG/2ML IJ SOLN
4.0000 mg | Freq: Once | INTRAMUSCULAR | Status: AC
  Administered 2024-01-11: 4 mg via INTRAVENOUS
  Filled 2024-01-11: qty 2

## 2024-01-11 MED ORDER — IOHEXOL 300 MG/ML  SOLN
100.0000 mL | Freq: Once | INTRAMUSCULAR | Status: AC | PRN
  Administered 2024-01-11: 100 mL via INTRAVENOUS

## 2024-01-11 MED ORDER — MORPHINE SULFATE (PF) 2 MG/ML IV SOLN
2.0000 mg | Freq: Once | INTRAVENOUS | Status: AC
  Administered 2024-01-11: 2 mg via INTRAVENOUS
  Filled 2024-01-11: qty 1

## 2024-01-11 NOTE — Assessment & Plan Note (Addendum)
 Bloody stool moderate amount, abdominal pain, on blood thinner.  Mix between constipation/diarrhea with mucous in stool  Gi appt had been placed as urgent, not until mid April for consult Hemorrhoids evaluated, not suspected cause of blood  Sending to ER for workup, pt stable upon leaving  Ddx gi bleed, diverticulitis, neoplasm, IBD, viral

## 2024-01-11 NOTE — Assessment & Plan Note (Signed)
 On eliquis

## 2024-01-11 NOTE — Assessment & Plan Note (Addendum)
 Preferably I'd like to order urine, cbc, bmp and possible eval for pyelonephritis /uti/ But sending pt to ER due to other concerning symptoms so will defer to them

## 2024-01-11 NOTE — Progress Notes (Signed)
 Established Patient Office Visit  Subjective:   Patient ID: Nicole Daniels, female    DOB: 1951-09-15  Age: 73 y.o. MRN: [id number]  CC:  Chief Complaint  Patient presents with   Medical Management of Chronic Issues    GI issues    HPI: Nicole Daniels is a 73 y.o. female presenting on 01/11/2024 for Medical Management of Chronic Issues (GI issues)  Right ear pain, has completed levaquin, she does state the pain has increased.   Yesterday evening felt super drained, went out to eat and then walked  around but was so tired she had to go back home to rest. She went to the bathroom and her stool was abnormal which worried her. She is still having stool pellets but there is more blood in the water, more so than usual. Normally it is on the stool only which it still was but the water has never been red along with it. She went two more times after this and had two more episodes of blood on her stool. She is also having pain in the left lower side of the abdomen. She brought pictures with her and they show a good amount of blood thin dark red discharge all around the stool and within. She said the stool smell was so bad that it made her feel like she could puke, it was 'awful'. On average she is probably having bowel movements about 3-5 daily, yesterday she had also seen some grey white pellets.   She is still feeling very drained, and was lightheaded last night.   She is on a blood thinner, eliquis as well. She is on this for  There is no rectal pain.   There is a 'bad pain' that started this am right around her right flank, and the pain is pretty constant this am. Appetite has been decreased. Sometimes with doe/sob but not always. Mainly with exertion.   Unrelated, she does repot a lot of itching in places all over her body. The itch started initially in her left lower leg prior to [place] diagnosing the DVT however has become worse. No rash or hives seen. The itching may or may not have  gotten worse with the blood thinner, she is not sure.     Wt Readings from Last 3 Encounters:  01/11/24 170 lb 6.4 oz (77.3 kg)  01/05/24 169 lb (76.7 kg)  01/01/24 169 lb (76.7 kg)        ROS: Negative unless specifically indicated above in HPI.   Relevant past medical history reviewed and updated as indicated.   Allergies and medications reviewed and updated.   Current Outpatient Medications:    acetaminophen (TYLENOL) 325 MG tablet, Take 650 mg by mouth every 6 (six) hours as needed., Disp: , Rfl:    albuterol (VENTOLIN HFA) 108 (90 Base) MCG/ACT inhaler, Inhale 2 puffs into the lungs every 6 (six) hours as needed for wheezing or shortness of breath., Disp: 8 g, Rfl: 2   apixaban (ELIQUIS) 5 MG TABS tablet, Take 1 tablet (5 mg total) by mouth 2 (two) times daily., Disp: 60 tablet, Rfl: 1   APIXABAN (ELIQUIS) VTE STARTER PACK (10MG  AND 5MG ), Take as directed on package: start with two-5mg  tablets twice daily for 7 days. On day 8, switch to one-5mg  tablet twice daily., Disp: 74 each, Rfl: 0   chlorpheniramine (ALLERGY RELIEF) 4 MG tablet, Take 1 tablet (4 mg total) by mouth 2 (two) times daily as needed for allergies., Disp: ,  Rfl:    Cholecalciferol (VITAMIN D3) 2000 units TABS, Take 2,000 Units by mouth daily., Disp: , Rfl:    Cyanocobalamin (VITAMIN B 12 PO), Take by mouth., Disp: , Rfl:    ezetimibe (ZETIA) 10 MG tablet, Take 1 tablet (10 mg total) by mouth daily., Disp: 90 tablet, Rfl: 3   fluticasone (FLONASE) 50 MCG/ACT nasal spray, Place 1 spray into both nostrils as needed for allergies. , Disp: , Rfl:    guaiFENesin-codeine 100-10 MG/5ML syrup, Take 5 mLs by mouth 3 (three) times daily as needed for cough., Disp: 120 mL, Rfl: 0   levothyroxine (SYNTHROID) 75 MCG tablet, Take 1 tablet (75 mcg total) by mouth daily., Disp: 90 tablet, Rfl: 3   meclizine (ANTIVERT) 12.5 MG tablet, Take 1 tablet (12.5 mg total) by mouth 3 (three) times daily as needed for dizziness., Disp: 30  tablet, Rfl: 0   meloxicam (MOBIC) 15 MG tablet, Take 1 tablet (15 mg total) by mouth daily., Disp: 15 tablet, Rfl: 0   OVER THE COUNTER MEDICATION, Sinus and pain, take PRN., Disp: , Rfl:    OVER THE COUNTER MEDICATION, Pro-biotic one capsule daily., Disp: , Rfl:   Allergies  Allergen Reactions   Simvastatin Other (See Comments)    MYALGIAS   Statins Other (See Comments)    Has severe leg muscle aches and cramps   Bactrim [Sulfamethoxazole-Trimethoprim] Itching   Sertraline Anxiety    Objective:   BP 124/84 (BP Location: Right Arm, Patient Position: Sitting, Cuff Size: Normal)   Pulse 88   Temp 98 F (36.7 C) (Temporal)   Ht 5\' 5"  (1.651 m)   Wt 170 lb 6.4 oz (77.3 kg)   SpO2 95%   BMI 28.36 kg/m    Physical Exam Constitutional:      General: She is not in acute distress.    Appearance: Normal appearance. She is normal weight. She is not ill-appearing, toxic-appearing or diaphoretic.  HENT:     Head: Normocephalic.  Cardiovascular:     Rate and Rhythm: Normal rate and regular rhythm.  Pulmonary:     Effort: Pulmonary effort is normal.     Breath sounds: Normal breath sounds.  Abdominal:     General: Bowel sounds are decreased. There is no distension.     Tenderness: There is abdominal tenderness in the right lower quadrant and epigastric area. There is right CVA tenderness.     Hernia: No hernia is present.  Genitourinary:    Rectum: External hemorrhoid (three non thrombosed, no erythema or tenderness) present.  Musculoskeletal:        General: Normal range of motion.  Neurological:     General: No focal deficit present.     Mental Status: She is alert and oriented to person, place, and time. Mental status is at baseline.  Psychiatric:        Mood and Affect: Mood normal.        Behavior: Behavior normal.        Thought Content: Thought content normal.        Judgment: Judgment normal.     Assessment & Plan:  Melena Assessment & Plan: Moderate amounts  visualized in stool on multiple stools with pictures pt brought with her from last night. Pt is on blood thinner so high concern for GI bleed, can not r/o. With increased fatigue and dizzy spells concern for anemia.    Acute deep vein thrombosis (DVT) of calf muscle vein of left lower extremity (HCC) Assessment &  Plan: On eliquis    Abdominal pain, right lower quadrant Assessment & Plan: Bloody stool moderate amount, abdominal pain, on blood thinner.  Mix between constipation/diarrhea with mucous in stool  Gi appt had been placed as urgent, not until mid April for consult Hemorrhoids evaluated, not suspected cause of blood  Sending to ER for workup, pt stable upon leaving  Ddx gi bleed, diverticulitis, neoplasm, IBD, viral    Acute right flank pain Assessment & Plan: Preferably I'd like to order urine, cbc, bmp and possible eval for pyelonephritis /uti/ But sending pt to ER due to other concerning symptoms so will defer to them   Pruritus Assessment & Plan: Several possibilities  Could consider eliquis as causative however will work up acute concerns today first with Er and then re eval  Monitor for worsening rash/sob/throat swelling etc      Follow up plan: Return if symptoms worsen or fail to improve.  Mort Sawyers, FNP

## 2024-01-11 NOTE — ED Notes (Signed)
 Pt to CT

## 2024-01-11 NOTE — ED Notes (Signed)
 RN reviewed discharge instructions with pt. Pt verbalized understanding and had no further questions. VSS upon discharge.

## 2024-01-11 NOTE — Assessment & Plan Note (Signed)
 Moderate amounts visualized in stool on multiple stools with pictures pt brought with her from last night. Pt is on blood thinner so high concern for GI bleed, can not r/o. With increased fatigue and dizzy spells concern for anemia.

## 2024-01-11 NOTE — Discharge Instructions (Signed)
 Your workup is very reassuring today.  Your red blood cell count is normal.  Your bleeding currently is minimal.  I have discussed results of your lab test and CT scan with on-call gastroenterology.  We recommend the following:  Monitor your symptoms very closely.  If you start having heavier amounts of bleeding, stools that are mostly blood, or more frequent bloody stool, please return to the emergency department or go to [facility] or [facility] for admission.  Also if you develop symptoms such as lightheadedness, shortness of breath, you will need to return.  Resume blood thinner tomorrow.  See your primary care early next week to have your blood cell counts rechecked.     Please read and follow all provided instructions.  Your diagnoses today include:  1. Rectal bleeding    Tests performed today include: Blood cell counts and platelets: normal hemoglobin Kidney and liver function tests Urine test to look for infection CT scan: No diverticulitis or other problems Vital signs. See below for your results today.   Medications prescribed:  None  Take any prescribed medications only as directed.  Home care instructions:  Follow any educational materials contained in this packet.  Follow-up instructions: Please follow-up with your primary care provider in the next 3 days for further evaluation of your symptoms and recheck of your blood cell counts.  Return instructions:  SEEK IMMEDIATE MEDICAL ATTENTION IF: The pain does not go away or becomes severe  A temperature above 101F develops  Repeated vomiting occurs (multiple episodes)  The pain becomes localized to portions of the abdomen. The right side could possibly be appendicitis. In an adult, the left lower portion of the abdomen could be colitis or diverticulitis.  Blood is being passed in stools or vomit (bright red or black tarry stools)  You develop chest pain, difficulty breathing, dizziness or fainting, or become  confused, poorly responsive, or inconsolable (young children) If you have any other emergent concerns regarding your health  Additional Information: Abdominal (belly) pain can be caused by many things. Your caregiver performed an examination and possibly ordered blood/urine tests and imaging (CT scan, x-rays, ultrasound). Many cases can be observed and treated at home after initial evaluation in the emergency department. Even though you are being discharged home, abdominal pain can be unpredictable. Therefore, you need a repeated exam if your pain does not resolve, returns, or worsens. Most patients with abdominal pain don't have to be admitted to the hospital or have surgery, but serious problems like appendicitis and gallbladder attacks can start out as nonspecific pain. Many abdominal conditions cannot be diagnosed in one visit, so follow-up evaluations are very important.  Your vital signs today were: BP 135/74   Pulse 68   Temp 98.1 F (36.7 C) (Oral)   Resp 16   Ht 5\' 5"  (1.651 m)   Wt 77.1 kg   SpO2 96%   BMI 28.29 kg/m  If your blood pressure (bp) was elevated above 135/85 this visit, please have this repeated by your doctor within one month. --------------

## 2024-01-11 NOTE — ED Provider Notes (Signed)
 [place] EMERGENCY DEPARTMENT AT [facility] Provider Note   CSN: [id number] Arrival date & time: 01/11/24  1030     History  Chief Complaint  Patient presents with   Rectal Bleeding    Nicole Daniels is a 73 y.o. female.  Patient with known history of polyps and diverticulosis noted on colonoscopy last year, DVT/PE currently on apixaban for less than a month (started 12/24/23 after ED diagnosis) --presents to the emergency department for evaluation of rectal bleeding.  Patient has had some light bleeding in the past.  She describes red blood noted in the stool.  She does have a history of hemorrhoids but does not feel like they are currently flared up.  She has developed some abdominal pain, intermittent, waxing and waning, usually worse on the left side.  Yesterday she felt "wiped out".  Saw PCP today, sent for further evaluation.  Patient denies other bleeding symptoms.       Home Medications Prior to Admission medications   Medication Sig Start Date End Date Taking? Authorizing Provider  acetaminophen (TYLENOL) 325 MG tablet Take 650 mg by mouth every 6 (six) hours as needed.    [provider]  albuterol (VENTOLIN HFA) 108 (90 Base) MCG/ACT inhaler Inhale 2 puffs into the lungs every 6 (six) hours as needed for wheezing or shortness of breath. 12/12/23   Bedsole, Amy E, MD  apixaban (ELIQUIS) 5 MG TABS tablet Take 1 tablet (5 mg total) by mouth 2 (two) times daily. 01/07/24   Mort Sawyers, FNP  APIXABAN Everlene Balls) VTE STARTER PACK (10MG  AND 5MG ) Take as directed on package: start with two-5mg  tablets twice daily for 7 days. On day 8, switch to one-5mg  tablet twice daily. 12/24/23   Mort Sawyers, FNP  chlorpheniramine (ALLERGY RELIEF) 4 MG tablet Take 1 tablet (4 mg total) by mouth 2 (two) times daily as needed for allergies. 10/11/23   Mort Sawyers, FNP  Cholecalciferol (VITAMIN D3) 2000 units TABS Take 2,000 Units by mouth daily.    [provider]  Cyanocobalamin (VITAMIN B 12 PO) Take by mouth.    [provider]  ezetimibe (ZETIA) 10 MG tablet Take 1 tablet (10 mg total) by mouth daily. 11/07/23   Mort Sawyers, FNP  fluticasone (FLONASE) 50 MCG/ACT nasal spray Place 1 spray into both nostrils as needed for allergies.     [provider]  guaiFENesin-codeine 100-10 MG/5ML syrup Take 5 mLs by mouth 3 (three) times daily as needed for cough. 12/29/23   Rondel Baton, MD  levothyroxine (SYNTHROID) 75 MCG tablet Take 1 tablet (75 mcg total) by mouth daily. 11/07/23   Mort Sawyers, FNP  meclizine (ANTIVERT) 12.5 MG tablet Take 1 tablet (12.5 mg total) by mouth 3 (three) times daily as needed for dizziness. 03/21/23   Mort Sawyers, FNP  meloxicam (MOBIC) 15 MG tablet Take 1 tablet (15 mg total) by mouth daily. 12/19/23   White, Elita Boone, NP  OVER THE COUNTER MEDICATION Sinus and pain, take PRN.    [provider]  OVER THE COUNTER MEDICATION Pro-biotic one capsule daily.    [provider]      Allergies    Simvastatin, Statins, Bactrim [sulfamethoxazole-trimethoprim], and Sertraline    Review of Systems   Review of Systems  Physical Exam Updated Vital Signs BP (!) 160/82 (BP Location: Right Arm)   Pulse 77   Temp 99.1 F (37.3 C)   Resp 18   Ht 5\' 5"  (1.651 m)  Wt 77.1 kg   SpO2 97%   BMI 28.29 kg/m   Physical Exam Vitals and nursing note reviewed.  Constitutional:      General: She is not in acute distress.    Appearance: She is well-developed.  HENT:     Head: Normocephalic and atraumatic.     Right Ear: External ear normal.     Left Ear: External ear normal.     Nose: Nose normal.  Eyes:     Conjunctiva/sclera: Conjunctivae normal.  Cardiovascular:     Rate and Rhythm: Normal rate and regular rhythm.     Heart sounds: No murmur heard. Pulmonary:     Effort: No respiratory distress.     Breath sounds: No wheezing, rhonchi or rales.  Abdominal:      Palpations: Abdomen is soft.     Tenderness: There is abdominal tenderness in the left lower quadrant. There is no guarding or rebound.  Musculoskeletal:     Cervical back: Normal range of motion and neck supple.     Right lower leg: No edema.     Left lower leg: No edema.  Skin:    General: Skin is warm and dry.     Findings: No rash.  Neurological:     General: No focal deficit present.     Mental Status: She is alert. Mental status is at baseline.     Motor: No weakness.  Psychiatric:        Mood and Affect: Mood normal.     ED Results / Procedures / Treatments   Labs (all labs ordered are listed, but only abnormal results are displayed) Labs Reviewed  COMPREHENSIVE METABOLIC PANEL - Abnormal; Notable for the following components:      Result Value   Potassium 3.2 (*)    All other components within normal limits  CBC - Abnormal; Notable for the following components:   Platelets 419 (*)    All other components within normal limits  URINALYSIS, ROUTINE W REFLEX MICROSCOPIC - Abnormal; Notable for the following components:   Color, Urine COLORLESS (*)    Specific Gravity, Urine 1.039 (*)    All other components within normal limits    EKG None  Radiology CT ABDOMEN PELVIS W CONTRAST Result Date: 01/11/2024 CLINICAL DATA:  Diverticulitis, complication suspected rectal bleeding LLQ pain. EXAM: CT ABDOMEN AND PELVIS WITH CONTRAST TECHNIQUE: Multidetector CT imaging of the abdomen and pelvis was performed using the standard protocol following bolus administration of intravenous contrast. RADIATION DOSE REDUCTION: This exam was performed according to the departmental dose-optimization program which includes automated exposure control, adjustment of the mA and/or kV according to patient size and/or use of iterative reconstruction technique. CONTRAST:  OMNIPAQUE IOHEXOL 300 MG/ML  SOLN COMPARISON:  CT scan abdomen and pelvis from 07/19/2023. FINDINGS: Lower chest: The lung  bases are clear. No pleural effusion. The heart is normal in size. No pericardial effusion. Hepatobiliary: The liver is normal in size. Non-cirrhotic configuration. No suspicious mass. No intrahepatic or extrahepatic bile duct dilation. Gallbladder is surgically absent. Pancreas: Unremarkable. No pancreatic ductal dilatation or surrounding inflammatory changes. Spleen: Within normal limits. No focal lesion. Adrenals/Urinary Tract: Adrenal glands are unremarkable. No suspicious renal mass. There are several bilateral partially exophytic cysts with largest in the right kidney measuring up to 1.5 x 1.8 cm. The cyst exhibits small focal wall calcification along the inferior aspect, similar to the prior study. No hydroureteronephrosis or nephroureterolithiasis on either side. Unremarkable urinary bladder. Stomach/Bowel: No disproportionate dilation  of the small or large bowel loops. No evidence of abnormal bowel wall thickening or inflammatory changes. The appendix was not visualized; however there is no acute inflammatory process in the right lower quadrant. There are multiple diverticula mainly in the left hemi colon, without imaging signs of diverticulitis. Vascular/Lymphatic: No ascites or pneumoperitoneum. No abdominal or pelvic lymphadenopathy, by size criteria. No aneurysmal dilation of the major abdominal arteries. There are moderate peripheral atherosclerotic vascular calcifications of the aorta and its major branches. Reproductive: The uterus is surgically absent. No large adnexal mass. Other: There are small fat containing umbilical and left inguinal hernias. The soft tissues and abdominal wall are otherwise unremarkable. Musculoskeletal: No suspicious osseous lesions. There are mild multilevel degenerative changes in the visualized spine. IMPRESSION: 1. No acute inflammatory process identified within the abdomen or pelvis. 2. Multiple other nonacute observations, as described above. Aortic Atherosclerosis  (ICD10-I70.0). Electronically Signed   By: Jules Schick M.D.   On: 01/11/2024 14:23    Procedures Procedures    Medications Ordered in ED Medications  iohexol (OMNIPAQUE) 300 MG/ML solution 100 mL (100 mLs Intravenous Contrast Given 01/11/24 1149)  morphine (PF) 2 MG/ML injection 2 mg (2 mg Intravenous Given 01/11/24 1355)  ondansetron (ZOFRAN) injection 4 mg (4 mg Intravenous Given 01/11/24 1355)    ED Course/ Medical Decision Making/ A&P    Patient seen and examined. History obtained directly from patient.  Reviewed PCP note.  Labs/EKG: Ordered CBC, CMP, UA.  Hemoccult performed, but no stool in rectal vault.  Patient has pictures of stool that show obvious bleeding, red blood.  Hemoccult not sent due to poor sample.  Imaging: Ordered CT abdomen pelvis to evaluate for diverticulitis  Medications/Fluids: None ordered  Most recent vital signs reviewed and are as follows: BP (!) 160/82 (BP Location: Right Arm)   Pulse 77   Temp 99.1 F (37.3 C)   Resp 18   Ht 5\' 5"  (1.651 m)   Wt 77.1 kg   SpO2 97%   BMI 28.29 kg/m   Initial impression: Rectal bleeding in setting of anticoagulation.  1:40 PM Reassessment performed. Patient appears stable.  She is still having some back pain.  Agrees the pain medication.  This was ordered.  Explained delay in imaging read.  Imaging personally visualized and interpreted including: CT, no obvious infection.  Stable renal cyst.  Reviewed pertinent lab work and imaging with patient at bedside. Questions answered.   Most current vital signs reviewed and are as follows: BP (!) 147/81   Pulse 71   Temp 99.1 F (37.3 C)   Resp 16   Ht 5\' 5"  (1.651 m)   Wt 77.1 kg   SpO2 93%   BMI 28.29 kg/m   Plan: Discuss with GI  3:13 PM Reassessment performed. Patient appears stable.   I have discussed case with [facility] NP and Dr. Chales Abrahams with GI. Reccs: I do not think she needs to come in from GI perspective- negative rectal exam for  bleeding, stable Hb. She had dvt/PE 2/24 (3 weeks ago). I would recommend IVC filter eval. Can resume eliquis (at least) low dose from tomorrow. If any further bleeding, then must come in   Discussed plan with patient.  She reports unchanged scant bleeding with bowel movement in ED x 1.  No heavier bleeding.  We discussed at length, need for close monitoring of symptoms.  If she has heavier bleeding including stools that are mostly blood, or more frequent bloody bowel movements --she should  return to the emergency department or go to [facility] or [facility] for admission.  Discussed that at this point we are hopeful to avoid hospital admission, however she is high risk given anticoagulation use, and if symptoms do worsen, will need to come back.  Discussed close PCP follow-up next week for recheck of hemoglobin.  Most current vital signs reviewed and are as follows: BP 135/74   Pulse 68   Temp 98.1 F (36.7 C) (Oral)   Resp 16   Ht 5\' 5"  (1.651 m)   Wt 77.1 kg   SpO2 96%   BMI 28.29 kg/m   Plan: Discharged home  Follow-up plan as above.   Return instructions as above.                                  Medical Decision Making Amount and/or Complexity of Data Reviewed Labs: ordered. Radiology: ordered.  Risk Prescription drug management.   For this patient's complaint of abdominal pain, the following conditions were considered on the differential diagnosis: gastritis/PUD, enteritis/duodenitis, appendicitis, cholelithiasis/cholecystitis, cholangitis, pancreatitis, ruptured viscus, colitis, diverticulitis, small/large bowel obstruction, proctitis, cystitis, pyelonephritis, ureteral colic, aortic dissection, aortic aneurysm. In women, pelvic inflammatory disease, ovarian cysts, and tubo-ovarian abscess were also considered. Atypical chest etiologies were also considered including ACS, PE, and pneumonia.  Patient has lower GI bleeding.  No infection on CT imaging.  History of  diverticulosis and this may be diverticular bleeding.  Patient's bleeding is mild at this point.  She is only passing some blood noted on stool with bowel movements.  On my exam here, no blood or stool obtained with digital rectal exam.  Patient is hemodynamically stable with normal blood pressure and pulse rate.  No hypoxia.  No lightheadedness or syncope.  No chest pain or shortness of breath.  Her hemoglobin is normal.  Given anticoagulation status, she is high risk for ongoing bleeding, but at this point the risk of stopping the anticoagulation outweighs benefits given her known pulmonary embolism and DVT.  Gastroenterology recommendations obtained.  Patient aware of need to return and is willing to return if symptoms worsen.  At this point we feel it is reasonable to give her a trial of close monitoring at home.  The patient's vital signs, pertinent lab work and imaging were reviewed and interpreted as discussed in the ED course. Hospitalization was considered for further testing, treatments, or serial exams/observation. However as patient is well-appearing, has a stable exam, and reassuring studies today, I do not feel that they warrant admission at this time. This plan was discussed with the patient who verbalizes agreement and comfort with this plan and seems reliable and able to return to the Emergency Department with worsening or changing symptoms.          Final Clinical Impression(s) / ED Diagnoses Final diagnoses:  Rectal bleeding    Rx / DC Orders ED Discharge Orders     None         Renne Crigler, PA-C 01/11/24 1519    Benjiman Core, MD 01/12/24 1030

## 2024-01-11 NOTE — Assessment & Plan Note (Signed)
 Several possibilities  Could consider eliquis as causative however will work up acute concerns today first with Er and then re eval  Monitor for worsening rash/sob/throat swelling etc

## 2024-01-11 NOTE — ED Triage Notes (Signed)
 On blood thinners  Has blood in stool.  Bright to dark color blood  Having lower abd. Pain  appears in NAD

## 2024-01-14 ENCOUNTER — Telehealth: Payer: Self-pay | Admitting: Family

## 2024-01-14 DIAGNOSIS — K921 Melena: Secondary | ICD-10-CM

## 2024-01-14 DIAGNOSIS — E876 Hypokalemia: Secondary | ICD-10-CM

## 2024-01-14 DIAGNOSIS — Z7901 Long term (current) use of anticoagulants: Secondary | ICD-10-CM

## 2024-01-14 MED ORDER — PANTOPRAZOLE SODIUM 20 MG PO TBEC: 20.0000 mg | DELAYED_RELEASE_TABLET | Freq: Every day | ORAL | 0 refills | Status: DC

## 2024-01-14 NOTE — Telephone Encounter (Signed)
 erroneous

## 2024-01-15 ENCOUNTER — Other Ambulatory Visit (INDEPENDENT_AMBULATORY_CARE_PROVIDER_SITE_OTHER)

## 2024-01-15 DIAGNOSIS — K921 Melena: Secondary | ICD-10-CM

## 2024-01-15 DIAGNOSIS — E876 Hypokalemia: Secondary | ICD-10-CM | POA: Diagnosis not present

## 2024-01-15 LAB — BASIC METABOLIC PANEL
BUN: 10 mg/dL (ref 6–23)
CO2: 28 meq/L (ref 19–32)
Calcium: 9.4 mg/dL (ref 8.4–10.5)
Chloride: 102 meq/L (ref 96–112)
Creatinine, Ser: 0.96 mg/dL (ref 0.40–1.20)
GFR: 58.54 mL/min — ABNORMAL LOW (ref >60.00)
Glucose, Bld: 153 mg/dL — ABNORMAL HIGH (ref 70–99)
Potassium: 3.2 meq/L — ABNORMAL LOW (ref 3.5–5.1)
Sodium: 139 meq/L (ref 135–145)

## 2024-01-15 LAB — CBC
HCT: 41.6 % (ref 36.0–46.0)
Hemoglobin: 13.9 g/dL (ref 12.0–15.0)
MCHC: 33.3 g/dL (ref 30.0–36.0)
MCV: 90.2 fl (ref 78.0–100.0)
Platelets: 408 10*3/uL — ABNORMAL HIGH (ref 150.0–400.0)
RBC: 4.61 Mil/uL (ref 3.87–5.11)
RDW: 14 % (ref 11.5–15.5)
WBC: 6 10*3/uL (ref 4.0–10.5)

## 2024-01-15 NOTE — Addendum Note (Signed)
 Addended by: Mort Sawyers on: 01/15/2024 07:29 AM   Modules accepted: Orders

## 2024-01-16 ENCOUNTER — Encounter: Payer: Self-pay | Admitting: Family

## 2024-01-16 ENCOUNTER — Other Ambulatory Visit: Payer: Self-pay | Admitting: Family

## 2024-01-16 DIAGNOSIS — E876 Hypokalemia: Secondary | ICD-10-CM

## 2024-01-16 MED ORDER — POTASSIUM CHLORIDE CRYS ER 10 MEQ PO TBCR: 10.0000 meq | EXTENDED_RELEASE_TABLET | Freq: Two times a day (BID) | ORAL | 0 refills | Status: DC

## 2024-01-18 ENCOUNTER — Telehealth: Payer: Self-pay | Admitting: Gastroenterology

## 2024-01-18 LAB — VENOUS THROMB. PATIENTS ON VKA
APTT: 28.9 s
AT III Act/Nor PPP Chro: 165 % — ABNORMAL HIGH
Act. Prt C Resist w/FV Defic.: 2.9 ratio
Anticardiolipin Ab, IgG: <10 [GPL'U]
Anticardiolipin Ab, IgM: <10 [MPL'U]
Beta-2 Glycoprotein I, IgA: <10 SAU
Beta-2 Glycoprotein I, IgG: <10 SGU
Beta-2 Glycoprotein I, IgM: <10 SMU
DRVVT Confirm Seconds: 91.2 s
DRVVT Ratio: 1.2 ratio
DRVVT Screen Seconds: 117.6 s — ABNORMAL HIGH
Factor VII Antigen**: 114 %
Factor VIII Activity: 170 % — ABNORMAL HIGH
Hexagonal Phospholipid Neutral: 2 s
Homocysteine: 8.4 umol/L
Prot C Ag Act/Nor PPP Imm: 123 %
Prot S Ag Act/Nor PPP Imm: 94 %
Protein C Ag/FVII Ag Ratio**: 1.1 ratio
Protein S Ag/FVII Ag Ratio**: 0.8 ratio

## 2024-01-18 NOTE — Telephone Encounter (Signed)
 Inbound call from patient in regards to her having rectal bleeding. Requesting to speak with a nurse. Please advise.   Thank you

## 2024-01-18 NOTE — Telephone Encounter (Signed)
 Pt stated that she has been having ongoing rectal bleeding along with some abdominal cramping that has been off and on for a month. Pt stated that the blood is separate from the stool but notices it in th toilet and also when she wipes. Pt stated that it is not bright red blood and neither is it dark. Pt stated that it was medium red. Pt stated that she  has been on Eliquis due to DVT in her legs. Pt stated that she does have a history of hemorrhoids but states this is different, Pt has been seen recently in  the ED for evaluation for the rectal bleeding and PCP has recently done labs pt.  Pt requested a sooner office visit. Pt was notified that I would schedule her in one of our 7 day hold slots on 01/28/2024 at 1:30 to see Deanna May NP. Pt was originally scheduled for 02/14/2024 to see [facility] May NP.  Pt was notified that if she has any increase in bleeding, SOB, Chest pain, dizziness or lightheadedness then she needs to go to the ED for evaluation.  Please review and advise if other recommendations

## 2024-01-19 ENCOUNTER — Emergency Department (HOSPITAL_BASED_OUTPATIENT_CLINIC_OR_DEPARTMENT_OTHER)

## 2024-01-19 ENCOUNTER — Encounter (HOSPITAL_BASED_OUTPATIENT_CLINIC_OR_DEPARTMENT_OTHER): Payer: Self-pay | Admitting: Emergency Medicine

## 2024-01-19 ENCOUNTER — Emergency Department (HOSPITAL_BASED_OUTPATIENT_CLINIC_OR_DEPARTMENT_OTHER)
Admission: EM | Admit: 2024-01-19 | Discharge: 2024-01-19 | Disposition: A | Discharge status: Home / Self Care | Attending: Emergency Medicine | Admitting: Emergency Medicine

## 2024-01-19 ENCOUNTER — Other Ambulatory Visit: Payer: Self-pay

## 2024-01-19 DIAGNOSIS — M79662 Pain in left lower leg: Secondary | ICD-10-CM | POA: Diagnosis not present

## 2024-01-19 DIAGNOSIS — Z86718 Personal history of other venous thrombosis and embolism: Secondary | ICD-10-CM | POA: Diagnosis not present

## 2024-01-19 DIAGNOSIS — Z7901 Long term (current) use of anticoagulants: Secondary | ICD-10-CM | POA: Insufficient documentation

## 2024-01-19 DIAGNOSIS — M79605 Pain in left leg: Secondary | ICD-10-CM | POA: Diagnosis not present

## 2024-01-19 DIAGNOSIS — I82442 Acute embolism and thrombosis of left tibial vein: Secondary | ICD-10-CM | POA: Diagnosis not present

## 2024-01-19 DIAGNOSIS — M79661 Pain in right lower leg: Secondary | ICD-10-CM | POA: Diagnosis not present

## 2024-01-19 LAB — BASIC METABOLIC PANEL
Anion gap: 6 (ref 5–15)
BUN: 8 mg/dL (ref 8–23)
CO2: 26 mmol/L (ref 22–32)
Calcium: 9.2 mg/dL (ref 8.9–10.3)
Chloride: 110 mmol/L (ref 98–111)
Creatinine, Ser: 0.85 mg/dL (ref 0.44–1.00)
GFR, Estimated: >60 mL/min (ref >60)
Glucose, Bld: 97 mg/dL (ref 70–99)
Potassium: 3.2 mmol/L — ABNORMAL LOW (ref 3.5–5.1)
Sodium: 142 mmol/L (ref 135–145)

## 2024-01-19 LAB — CBC WITH DIFFERENTIAL/PLATELET
Abs Immature Granulocytes: 0.01 10*3/uL (ref 0.00–0.07)
Basophils Absolute: 0 10*3/uL (ref 0.0–0.1)
Basophils Relative: 0 %
Eosinophils Absolute: 0.3 10*3/uL (ref 0.0–0.5)
Eosinophils Relative: 5 %
HCT: 41.3 % (ref 36.0–46.0)
Hemoglobin: 13.5 g/dL (ref 12.0–15.0)
Immature Granulocytes: 0 %
Lymphocytes Relative: 34 %
Lymphs Abs: 2 10*3/uL (ref 0.7–4.0)
MCH: 29.7 pg (ref 26.0–34.0)
MCHC: 32.7 g/dL (ref 30.0–36.0)
MCV: 90.8 fL (ref 80.0–100.0)
Monocytes Absolute: 0.7 10*3/uL (ref 0.1–1.0)
Monocytes Relative: 11 %
Neutro Abs: 2.9 10*3/uL (ref 1.7–7.7)
Neutrophils Relative %: 50 %
Platelets: 352 10*3/uL (ref 150–400)
RBC: 4.55 MIL/uL (ref 3.87–5.11)
RDW: 14.2 % (ref 11.5–15.5)
WBC: 5.9 10*3/uL (ref 4.0–10.5)
nRBC: 0 % (ref 0.0–0.2)

## 2024-01-19 NOTE — Discharge Instructions (Signed)
 You were seen in the emergency department for your left leg pain.  Your ultrasound shows that your clot burden is improving and had no new clots seen in your legs.  Your potassium levels were still low and your labs are otherwise normal.  You may have strained her leg or worsen the pain from overworking this weekend.  You should continue to take your Eliquis as prescribed.  You should follow-up with your primary doctor to have your symptoms rechecked.  You should return to the emergency department if having significantly worsening pain, significant swelling of your leg, chest pain or shortness of breath or any other new or concerning symptoms.

## 2024-01-19 NOTE — ED Provider Notes (Signed)
 [place] EMERGENCY DEPARTMENT AT [facility] Provider Note   CSN: [id number] Arrival date & time: 01/19/24  1648     History  Chief Complaint  Patient presents with   Leg Pain    Nicole Daniels is a 73 y.o. female.  Patient is a 73 year old female with PMH recent VTE on Eliquis presenting to the emergency department with left leg pain.  The patient states that yesterday she started to have some pain along her left calf and the pain increased in her left thigh earlier today.  She states that she had some mild pain on the right side.  She states that she has had no swelling in her leg.  She states that she did fall off a curb a couple days ago but did not injure herself at that time.  She denies any numbness or weakness.  She states that she has been compliant on her Eliquis though this does feel somewhat similar to when she was diagnosed with her PE.  She denies any associated chest pain or shortness of breath to me and states that she just feels anxious.  She states that she called the on-call doctor for her primary who recommended she come to the ER to be evaluated.  The history is provided by the patient and a relative.  Leg Pain      Home Medications Prior to Admission medications   Medication Sig Start Date End Date Taking? Authorizing Provider  acetaminophen (TYLENOL) 325 MG tablet Take 650 mg by mouth every 6 (six) hours as needed.    [provider]  albuterol (VENTOLIN HFA) 108 (90 Base) MCG/ACT inhaler Inhale 2 puffs into the lungs every 6 (six) hours as needed for wheezing or shortness of breath. 12/12/23   Bedsole, Amy E, MD  apixaban (ELIQUIS) 5 MG TABS tablet Take 1 tablet (5 mg total) by mouth 2 (two) times daily. 01/07/24   Mort Sawyers, FNP  APIXABAN Everlene Balls) VTE STARTER PACK (10MG  AND 5MG ) Take as directed on package: start with two-5mg  tablets twice daily for 7 days. On day 8, switch to one-5mg  tablet twice daily. 12/24/23   Mort Sawyers,  FNP  chlorpheniramine (ALLERGY RELIEF) 4 MG tablet Take 1 tablet (4 mg total) by mouth 2 (two) times daily as needed for allergies. 10/11/23   Mort Sawyers, FNP  Cholecalciferol (VITAMIN D3) 2000 units TABS Take 2,000 Units by mouth daily.    [provider]  Cyanocobalamin (VITAMIN B 12 PO) Take by mouth.    [provider]  ezetimibe (ZETIA) 10 MG tablet Take 1 tablet (10 mg total) by mouth daily. 11/07/23   Mort Sawyers, FNP  fluticasone (FLONASE) 50 MCG/ACT nasal spray Place 1 spray into both nostrils as needed for allergies.     [provider]  guaiFENesin-codeine 100-10 MG/5ML syrup Take 5 mLs by mouth 3 (three) times daily as needed for cough. 12/29/23   Rondel Baton, MD  levothyroxine (SYNTHROID) 75 MCG tablet Take 1 tablet (75 mcg total) by mouth daily. 11/07/23   Mort Sawyers, FNP  meclizine (ANTIVERT) 12.5 MG tablet Take 1 tablet (12.5 mg total) by mouth 3 (three) times daily as needed for dizziness. 03/21/23   Mort Sawyers, FNP  OVER THE COUNTER MEDICATION Sinus and pain, take PRN.    [provider]  OVER THE COUNTER MEDICATION Pro-biotic one capsule daily.    [provider]  pantoprazole (PROTONIX) 20 MG tablet Take 1 tablet (20 mg total) by  mouth daily. 01/14/24   Mort Sawyers, FNP  potassium chloride (KLOR-CON M) 10 MEQ tablet Take 1 tablet (10 mEq total) by mouth 2 (two) times daily for 5 days. 01/16/24 01/21/24  Mort Sawyers, FNP      Allergies    Simvastatin, Statins, Bactrim [sulfamethoxazole-trimethoprim], and Sertraline    Review of Systems   Review of Systems  Physical Exam Updated Vital Signs BP (!) 143/64 (BP Location: Right Arm)   Pulse 93   Temp 98.3 F (36.8 C)   Resp 18   Wt 76.7 kg   SpO2 95%   BMI 28.12 kg/m  Physical Exam Vitals and nursing note reviewed.  Constitutional:      General: She is not in acute distress.    Appearance: Normal appearance.  HENT:     Head: Normocephalic and  atraumatic.     Nose: Nose normal.     Mouth/Throat:     Mouth: Mucous membranes are moist.     Pharynx: Oropharynx is clear.  Eyes:     Extraocular Movements: Extraocular movements intact.     Conjunctiva/sclera: Conjunctivae normal.  Cardiovascular:     Rate and Rhythm: Normal rate and regular rhythm.     Heart sounds: Normal heart sounds.  Pulmonary:     Effort: Pulmonary effort is normal.     Breath sounds: Normal breath sounds.  Abdominal:     General: Abdomen is flat.     Palpations: Abdomen is soft.     Tenderness: There is no abdominal tenderness.  Musculoskeletal:        General: Tenderness (Mild tenderness to L lateral calf and thigh) present. Normal range of motion.     Cervical back: Normal range of motion.     Right lower leg: No edema.     Left lower leg: No edema.     Comments: Negative straight leg raise bilaterally  Skin:    General: Skin is warm and dry.  Neurological:     General: No focal deficit present.     Mental Status: She is alert and oriented to person, place, and time.     Sensory: No sensory deficit.     Motor: No weakness.  Psychiatric:        Mood and Affect: Mood normal.        Behavior: Behavior normal.     ED Results / Procedures / Treatments   Labs (all labs ordered are listed, but only abnormal results are displayed) Labs Reviewed  BASIC METABOLIC PANEL - Abnormal; Notable for the following components:      Result Value   Potassium 3.2 (*)    All other components within normal limits  CBC WITH DIFFERENTIAL/PLATELET    EKG EKG Interpretation Date/Time:  Saturday January 19 2024 17:05:27 EDT Ventricular Rate:  77 PR Interval:  154 QRS Duration:  78 QT Interval:  346 QTC Calculation: 391 R Axis:   24  Text Interpretation: Normal sinus rhythm Cannot rule out Anterior infarct , age undetermined Abnormal ECG No significant change since last tracing Confirmed by Elayne Snare ([id number]) on 01/19/2024 5:19:40 PM  Radiology US  Venous Img Lower Bilateral (DVT) Result Date: 01/19/2024 CLINICAL DATA:  Bilateral calf pain for several days, initial encounter EXAM: BILATERAL LOWER EXTREMITY VENOUS DOPPLER ULTRASOUND TECHNIQUE: Gray-scale sonography with graded compression, as well as color Doppler and duplex ultrasound were performed to evaluate the lower extremity deep venous systems from the level of the common femoral vein and including the common femoral, femoral,  profunda femoral, popliteal and calf veins including the posterior tibial, peroneal and gastrocnemius veins when visible. The superficial great saphenous vein was also interrogated. Spectral Doppler was utilized to evaluate flow at rest and with distal augmentation maneuvers in the common femoral, femoral and popliteal veins. COMPARISON:  None Available. FINDINGS: RIGHT LOWER EXTREMITY Common Femoral Vein: No evidence of thrombus. Normal compressibility, respiratory phasicity and response to augmentation. Saphenofemoral Junction: No evidence of thrombus. Normal compressibility and flow on color Doppler imaging. Profunda Femoral Vein: No evidence of thrombus. Normal compressibility and flow on color Doppler imaging. Femoral Vein: No evidence of thrombus. Normal compressibility, respiratory phasicity and response to augmentation. Popliteal Vein: No evidence of thrombus. Normal compressibility, respiratory phasicity and response to augmentation. Calf Veins: No evidence of thrombus. Normal compressibility and flow on color Doppler imaging. Superficial Great Saphenous Vein: No evidence of thrombus. Normal compressibility and flow on color Doppler imaging. Venous Reflux:  None. Other Findings:  None. LEFT LOWER EXTREMITY Common Femoral Vein: No evidence of thrombus. Normal compressibility, respiratory phasicity and response to augmentation. Saphenofemoral Junction: No evidence of thrombus. Normal compressibility and flow on color Doppler imaging. Profunda Femoral Vein: No evidence of  thrombus. Normal compressibility and flow on color Doppler imaging. Femoral Vein: No evidence of thrombus. Normal compressibility, respiratory phasicity and response to augmentation. Popliteal Vein: No evidence of thrombus. Normal compressibility, respiratory phasicity and response to augmentation. Calf Veins: Thrombus is noted within the posterior tibial and peroneal veins. Superficial Great Saphenous Vein: No evidence of thrombus. Normal compressibility and flow on color Doppler imaging. Venous Reflux:  None. Other Findings:  None. IMPRESSION: Deep venous thrombus within the left posterior tibial and peroneal veins. Electronically Signed   By: Alcide Clever M.D.   On: 01/19/2024 19:39    Procedures Procedures    Medications Ordered in ED Medications - No data to display  ED Course/ Medical Decision Making/ A&P Clinical Course as of 01/19/24 2017  Sat Jan 19, 2024  1949 DVT visualized in L posterior tibial and peroneal veins which appears similar to prior DVT study. Previously also had DVT in the L popliteal so it appears clot burden is decreasing and no signs of new or worsening clot burden. [VK]    Clinical Course User Index [VK] Rexford Maus, DO                                 Medical Decision Making This patient presents to the ED with chief complaint(s) of L leg pain with pertinent past medical history of recent DVT/PE on Eliquis which further complicates the presenting complaint. The complaint involves an extensive differential diagnosis and also carries with it a high risk of complications and morbidity.    The differential diagnosis includes DVT, muscle strain or spasm, no bony tenderness making fracture unlikely   Additional history obtained: Additional history obtained from family Records reviewed Primary Care Documents  ED Course and Reassessment: On patient's arrival she is hemodynamically stable in no acute distress and is neurovascularly intact.  Initially reported  some chest pain and shortness of breath in triage but declined this to myself.  EKG on arrival showed normal sinus rhythm without acute ischemic changes.  Patient will have labs and repeat DVT ultrasound performed to evaluate for new or worsening clot burden and she will be closely reassessed.  Independent labs interpretation:  The following labs were independently interpreted: mild hypokalemia, otherwise within normal range  Independent visualization of imaging: - I independently visualized the following imaging with scope of interpretation limited to determining acute life threatening conditions related to emergency care: Bilateral LE [place], which revealed DVT in L tibial and perioneal veins which is improved from prior US, no new clot burden visualized  Consultation: - Consulted or discussed management/test interpretation w/ external professional: N/A  Consideration for admission or further workup: Patient has no emergent conditions requiring admission or further work-up at this time and is stable for discharge home with primary care follow-up  Social Determinants of health: N/A    Amount and/or Complexity of Data Reviewed Labs: ordered.          Final Clinical Impression(s) / ED Diagnoses Final diagnoses:  Left leg pain  History of deep vein thrombosis (DVT) of lower extremity    Rx / DC Orders ED Discharge Orders     None         Rexford Maus, DO 01/19/24 2017

## 2024-01-19 NOTE — ED Triage Notes (Signed)
 Pt c/o BLE pain, worse on LT side x 2 days, endorses concern for dvt. PT takes eliquis. Endorses mild CP, shob. Pt speaking in complete sentences.

## 2024-01-21 ENCOUNTER — Other Ambulatory Visit: Payer: Self-pay | Admitting: Family

## 2024-01-21 ENCOUNTER — Telehealth: Payer: Self-pay

## 2024-01-21 DIAGNOSIS — R791 Abnormal coagulation profile: Secondary | ICD-10-CM

## 2024-01-21 DIAGNOSIS — K921 Melena: Secondary | ICD-10-CM

## 2024-01-21 DIAGNOSIS — I2699 Other pulmonary embolism without acute cor pulmonale: Secondary | ICD-10-CM

## 2024-01-21 DIAGNOSIS — I82462 Acute embolism and thrombosis of left calf muscular vein: Secondary | ICD-10-CM

## 2024-01-21 DIAGNOSIS — I743 Embolism and thrombosis of arteries of the lower extremities: Secondary | ICD-10-CM

## 2024-01-21 DIAGNOSIS — Z7901 Long term (current) use of anticoagulants: Secondary | ICD-10-CM

## 2024-01-21 NOTE — Telephone Encounter (Signed)
 Per chart review tab pt was seen [facility] on 01/19/24. Sending note to Hayden Pedro FNP.

## 2024-01-21 NOTE — Telephone Encounter (Signed)
 noted

## 2024-01-21 NOTE — Telephone Encounter (Signed)
 Marland Kitchen

## 2024-01-23 ENCOUNTER — Inpatient Hospital Stay

## 2024-01-23 ENCOUNTER — Inpatient Hospital Stay: Attending: Oncology | Admitting: Oncology

## 2024-01-23 ENCOUNTER — Encounter: Payer: Self-pay | Admitting: Oncology

## 2024-01-23 DIAGNOSIS — Z86711 Personal history of pulmonary embolism: Secondary | ICD-10-CM

## 2024-01-23 DIAGNOSIS — Z86718 Personal history of other venous thrombosis and embolism: Secondary | ICD-10-CM | POA: Diagnosis not present

## 2024-01-23 DIAGNOSIS — Z7901 Long term (current) use of anticoagulants: Secondary | ICD-10-CM | POA: Diagnosis not present

## 2024-01-23 DIAGNOSIS — I743 Embolism and thrombosis of arteries of the lower extremities: Secondary | ICD-10-CM

## 2024-01-23 DIAGNOSIS — Z79899 Other long term (current) drug therapy: Secondary | ICD-10-CM | POA: Insufficient documentation

## 2024-01-23 MED ORDER — APIXABAN 5 MG PO TABS: 5.0000 mg | ORAL_TABLET | Freq: Two times a day (BID) | ORAL | 6 refills | Status: DC

## 2024-01-23 NOTE — Progress Notes (Signed)
 [facility]  [phone] [phone]  ID: Nicole Daniels OB: [birth year]  MR#: [id number]  AOZ#:[id number]  Patient Care Team: Mort Sawyers, FNP as PCP - General (Family Medicine) Julio Sicks, MD as Consulting Physician (Neurosurgery) Vanna Scotland, MD as Consulting Physician (Urology) Jodelle Red, MD as Consulting Physician (Cardiology) Jeralyn Ruths, MD as Consulting Physician (Hematology and Oncology)  CHIEF COMPLAINT: DVT/PE.  INTERVAL HISTORY: Patient is a 73 year old female who was diagnosed with left lower extremity DVT as well as bilateral PE in February 2025.  Full hypercoagulable workup was essentially negative, but patient noted to have elevated factor VIII level.  She recently had some rectal bleeding and hemoptysis and was evaluated in the ED, but hemoglobin remained stable patient did not require treatment or admission.  She currently feels well and is asymptomatic.  She has no neurologic complaints.  Patient reports she had a viral illness for 1 to 2 weeks leading up to her diagnosis of blood clot, but otherwise has felt well.  She has good active hide and denies weight loss.  She has no chest pain, shortness of breath, cough, or hemoptysis.  She denies any nausea, vomiting, constipation, or diarrhea.  She has no melena and denies any further hematochezia.  She has no urinary complaints.  Patient offers no further specific complaints today.  REVIEW OF SYSTEMS:   Review of Systems  Constitutional: Negative.  Negative for fever, malaise/fatigue and weight loss.  Respiratory: Negative.  Negative for cough, hemoptysis and shortness of breath.   Cardiovascular: Negative.  Negative for chest pain and leg swelling.  Gastrointestinal: Negative.  Negative for abdominal pain, blood in stool and melena.  Genitourinary: Negative.  Negative for hematuria.  Musculoskeletal: Negative.  Negative for back pain.  Skin: Negative.   Negative for rash.  Neurological: Negative.  Negative for dizziness, focal weakness, weakness and headaches.  Psychiatric/Behavioral: Negative.  The patient is not nervous/anxious.     As per HPI. Otherwise, a complete review of systems is negative.  PAST MEDICAL HISTORY: Past Medical History:  Diagnosis Date   Abdominal pain, right lower quadrant    Acquired cyst of kidney 01/08/2012   pt denies history of    Acute sinusitis, unspecified    Adenomatous colon polyp    Allergy    Angiomyolipoma    kidney   Anxiety    OCCASIONAL   Cataract    Chest pain    Chest pain, unspecified    Depressive disorder, not elsewhere classified 01/08/2012   not currently    Diverticulosis    Dizziness and giddiness    Dysfunction of eustachian tube    H/O Spinal surgery 02/20/2017   Hemorrhoids    History of diverticulitis of colon    History of fusion of cervical spine 04/25/2018   IBS (irritable bowel syndrome)    Liver lesion 2011   Neoplasm of skin of face    malignant   OA (osteoarthritis)    knee, NECK   Obesity, unspecified    Ostium secundum type atrial septal defect    Other and unspecified hyperlipidemia    Other malaise and fatigue    Palpitations    Shortness of breath    Thyroid disease    Unspecified essential hypertension    pt states she does not have HTN   Unspecified sinusitis (chronic)    Unspecified vitamin D deficiency     PAST SURGICAL HISTORY: Past Surgical History:  Procedure Laterality Date   ABDOMINAL HYSTERECTOMY  BREAST REDUCTION SURGERY  1990   CERVICAL DISC SURGERY     Dr.Nudleman   CHOLECYSTECTOMY     COLONOSCOPY     DILATION AND CURETTAGE OF UTERUS     EYE SURGERY Bilateral 07/30/2017   cataract sx   FLEXIBLE SIGMOIDOSCOPY  01/08/2012   Procedure: FLEXIBLE SIGMOIDOSCOPY;  Surgeon: Hart Carwin, MD;  Location: [facility];  Service: Endoscopy;  Laterality: N/A;   HEMORRHOID SURGERY     KNEE ARTHROTOMY     pt states no    LAMINECTOMY  N/A 01/31/2017   Procedure: Thoracic Laminectomy and marsupialization of arachnoid cyst;  Surgeon: Shirlean Kelly, MD;  Location: [facility] OR;  Service: Neurosurgery;  Laterality: N/A;   LEFT OOPHORECTOMY  1976   NECK SURGERY N/A 03/2018   PARTIAL HYSTERECTOMY     right ovary remains   REDUCTION MAMMAPLASTY     TONSILLECTOMY  1972   UPPER GASTROINTESTINAL ENDOSCOPY      FAMILY HISTORY: Family History  Problem Relation Age of Onset   Hypertension Mother    Diabetes Mother    Cirrhosis Father    Heart disease Father    Hypertension Sister    Diabetes Sister    Breast cancer Cousin 76 - 63   Diabetes Cousin    Heart disease Brother        x 2   Diabetes Other        aunt   Stroke Other        aunt   Colon cancer Neg Hx    Esophageal cancer Neg Hx    Stomach cancer Neg Hx    Rectal cancer Neg Hx     ADVANCED DIRECTIVES (Y/N):  N  HEALTH MAINTENANCE: Social History   Tobacco Use   Smoking status: Never   Smokeless tobacco: Never  Vaping Use   Vaping status: Never Used  Substance Use Topics   Alcohol use: No   Drug use: No     Colonoscopy:  PAP:  Bone density:  Lipid panel:  Allergies  Allergen Reactions   Simvastatin Other (See Comments)    MYALGIAS   Statins Other (See Comments)    Has severe leg muscle aches and cramps   Bactrim [Sulfamethoxazole-Trimethoprim] Itching   Sertraline Anxiety    Current Outpatient Medications  Medication Sig Dispense Refill   acetaminophen (TYLENOL) 325 MG tablet Take 650 mg by mouth every 6 (six) hours as needed.     albuterol (VENTOLIN HFA) 108 (90 Base) MCG/ACT inhaler Inhale 2 puffs into the lungs every 6 (six) hours as needed for wheezing or shortness of breath. 8 g 2   apixaban (ELIQUIS) 5 MG TABS tablet Take 1 tablet (5 mg total) by mouth 2 (two) times daily. 60 tablet 6   chlorpheniramine (ALLERGY RELIEF) 4 MG tablet Take 1 tablet (4 mg total) by mouth 2 (two) times daily as needed for allergies.     Cholecalciferol  (VITAMIN D3) 2000 units TABS Take 2,000 Units by mouth daily.     ezetimibe (ZETIA) 10 MG tablet Take 1 tablet (10 mg total) by mouth daily. 90 tablet 3   fluticasone (FLONASE) 50 MCG/ACT nasal spray Place 1 spray into both nostrils as needed for allergies.      levothyroxine (SYNTHROID) 75 MCG tablet Take 1 tablet (75 mcg total) by mouth daily. 90 tablet 3   meclizine (ANTIVERT) 12.5 MG tablet Take 1 tablet (12.5 mg total) by mouth 3 (three) times daily as needed for dizziness. 30 tablet 0  OVER THE COUNTER MEDICATION Sinus and pain, take PRN.     OVER THE COUNTER MEDICATION Pro-biotic one capsule daily.     potassium chloride (KLOR-CON M) 10 MEQ tablet Take 1 tablet (10 mEq total) by mouth 2 (two) times daily for 5 days. 10 tablet 0   Cyanocobalamin (VITAMIN B 12 PO) Take by mouth. (Patient not taking: Reported on 01/23/2024)     pantoprazole (PROTONIX) 20 MG tablet Take 1 tablet (20 mg total) by mouth daily. (Patient not taking: Reported on 01/23/2024) 30 tablet 0   No current facility-administered medications for this visit.    OBJECTIVE: Vitals:   01/23/24 1305  BP: (!) 155/82  Pulse: 77  Resp: 16  Temp: 99.3 F (37.4 C)  SpO2: 97%     Body mass index is 28.29 kg/m.    ECOG FS:0 - Asymptomatic  General: Well-developed, well-nourished, no acute distress. Eyes: Pink conjunctiva, anicteric sclera. HEENT: Normocephalic, moist mucous membranes. Lungs: No audible wheezing or coughing. Heart: Regular rate and rhythm. Abdomen: Soft, nontender, no obvious distention. Musculoskeletal: No edema, cyanosis, or clubbing. Neuro: Alert, answering all questions appropriately. Cranial nerves grossly intact. Skin: No rashes or petechiae noted. Psych: Normal affect. Lymphatics: No cervical, calvicular, axillary or inguinal LAD.   LAB RESULTS:  Lab Results  Component Value Date   NA 142 01/19/2024   K 3.2 (L) 01/19/2024   CL 110 01/19/2024   CO2 26 01/19/2024   GLUCOSE 97 01/19/2024    BUN 8 01/19/2024   CREATININE 0.85 01/19/2024   CALCIUM 9.2 01/19/2024   PROT 6.9 01/11/2024   ALBUMIN 3.9 01/11/2024   AST 17 01/11/2024   ALT 14 01/11/2024   ALKPHOS 85 01/11/2024   BILITOT 0.6 01/11/2024   GFRNONAA >60 01/19/2024   GFRAA 89 07/01/2020    Lab Results  Component Value Date   WBC 5.9 01/19/2024   NEUTROABS 2.9 01/19/2024   HGB 13.5 01/19/2024   HCT 41.3 01/19/2024   MCV 90.8 01/19/2024   PLT 352 01/19/2024     STUDIES: US Venous Img Lower Bilateral (DVT) Result Date: 01/19/2024 CLINICAL DATA:  Bilateral calf pain for several days, initial encounter EXAM: BILATERAL LOWER EXTREMITY VENOUS DOPPLER ULTRASOUND TECHNIQUE: Gray-scale sonography with graded compression, as well as color Doppler and duplex ultrasound were performed to evaluate the lower extremity deep venous systems from the level of the common femoral vein and including the common femoral, femoral, profunda femoral, popliteal and calf veins including the posterior tibial, peroneal and gastrocnemius veins when visible. The superficial great saphenous vein was also interrogated. Spectral Doppler was utilized to evaluate flow at rest and with distal augmentation maneuvers in the common femoral, femoral and popliteal veins. COMPARISON:  None Available. FINDINGS: RIGHT LOWER EXTREMITY Common Femoral Vein: No evidence of thrombus. Normal compressibility, respiratory phasicity and response to augmentation. Saphenofemoral Junction: No evidence of thrombus. Normal compressibility and flow on color Doppler imaging. Profunda Femoral Vein: No evidence of thrombus. Normal compressibility and flow on color Doppler imaging. Femoral Vein: No evidence of thrombus. Normal compressibility, respiratory phasicity and response to augmentation. Popliteal Vein: No evidence of thrombus. Normal compressibility, respiratory phasicity and response to augmentation. Calf Veins: No evidence of thrombus. Normal compressibility and flow on color  Doppler imaging. Superficial Great Saphenous Vein: No evidence of thrombus. Normal compressibility and flow on color Doppler imaging. Venous Reflux:  None. Other Findings:  None. LEFT LOWER EXTREMITY Common Femoral Vein: No evidence of thrombus. Normal compressibility, respiratory phasicity and response to augmentation. Saphenofemoral  Junction: No evidence of thrombus. Normal compressibility and flow on color Doppler imaging. Profunda Femoral Vein: No evidence of thrombus. Normal compressibility and flow on color Doppler imaging. Femoral Vein: No evidence of thrombus. Normal compressibility, respiratory phasicity and response to augmentation. Popliteal Vein: No evidence of thrombus. Normal compressibility, respiratory phasicity and response to augmentation. Calf Veins: Thrombus is noted within the posterior tibial and peroneal veins. Superficial Great Saphenous Vein: No evidence of thrombus. Normal compressibility and flow on color Doppler imaging. Venous Reflux:  None. Other Findings:  None. IMPRESSION: Deep venous thrombus within the left posterior tibial and peroneal veins. Electronically Signed   By: Alcide Clever M.D.   On: 01/19/2024 19:39   CT ABDOMEN PELVIS W CONTRAST Result Date: 01/11/2024 CLINICAL DATA:  Diverticulitis, complication suspected rectal bleeding LLQ pain. EXAM: CT ABDOMEN AND PELVIS WITH CONTRAST TECHNIQUE: Multidetector CT imaging of the abdomen and pelvis was performed using the standard protocol following bolus administration of intravenous contrast. RADIATION DOSE REDUCTION: This exam was performed according to the departmental dose-optimization program which includes automated exposure control, adjustment of the mA and/or kV according to patient size and/or use of iterative reconstruction technique. CONTRAST:  OMNIPAQUE IOHEXOL 300 MG/ML  SOLN COMPARISON:  CT scan abdomen and pelvis from 07/19/2023. FINDINGS: Lower chest: The lung bases are clear. No pleural effusion. The heart  is normal in size. No pericardial effusion. Hepatobiliary: The liver is normal in size. Non-cirrhotic configuration. No suspicious mass. No intrahepatic or extrahepatic bile duct dilation. Gallbladder is surgically absent. Pancreas: Unremarkable. No pancreatic ductal dilatation or surrounding inflammatory changes. Spleen: Within normal limits. No focal lesion. Adrenals/Urinary Tract: Adrenal glands are unremarkable. No suspicious renal mass. There are several bilateral partially exophytic cysts with largest in the right kidney measuring up to 1.5 x 1.8 cm. The cyst exhibits small focal wall calcification along the inferior aspect, similar to the prior study. No hydroureteronephrosis or nephroureterolithiasis on either side. Unremarkable urinary bladder. Stomach/Bowel: No disproportionate dilation of the small or large bowel loops. No evidence of abnormal bowel wall thickening or inflammatory changes. The appendix was not visualized; however there is no acute inflammatory process in the right lower quadrant. There are multiple diverticula mainly in the left hemi colon, without imaging signs of diverticulitis. Vascular/Lymphatic: No ascites or pneumoperitoneum. No abdominal or pelvic lymphadenopathy, by size criteria. No aneurysmal dilation of the major abdominal arteries. There are moderate peripheral atherosclerotic vascular calcifications of the aorta and its major branches. Reproductive: The uterus is surgically absent. No large adnexal mass. Other: There are small fat containing umbilical and left inguinal hernias. The soft tissues and abdominal wall are otherwise unremarkable. Musculoskeletal: No suspicious osseous lesions. There are mild multilevel degenerative changes in the visualized spine. IMPRESSION: 1. No acute inflammatory process identified within the abdomen or pelvis. 2. Multiple other nonacute observations, as described above. Aortic Atherosclerosis (ICD10-I70.0). Electronically Signed   By: Jules Schick M.D.   On: 01/11/2024 14:23   DG Chest 2 View Result Date: 12/29/2023 CLINICAL DATA:  Chest pain. EXAM: CHEST - 2 VIEW COMPARISON:  Radiograph 12/12/2023.  CT 12/24/2023 FINDINGS: The cardiomediastinal contours are unchanged. Basilar opacities on CT of no radiographic correlate. Pulmonary vasculature is normal. No consolidation, pleural effusion, or pneumothorax. No acute osseous abnormalities are seen. IMPRESSION: No acute findings. Electronically Signed   By: Narda Rutherford M.D.   On: 12/29/2023 17:38   CT Angio Chest PE W/Cm &/Or Wo Cm Result Date: 12/24/2023 CLINICAL DATA:  Pulmonary embolus suspected with  high probability. Left leg pain last Wednesday. Ultrasound positive for DVT. Chest pain and shortness of breath. EXAM: CT ANGIOGRAPHY CHEST WITH CONTRAST TECHNIQUE: Multidetector CT imaging of the chest was performed using the standard protocol during bolus administration of intravenous contrast. Multiplanar CT image reconstructions and MIPs were obtained to evaluate the vascular anatomy. RADIATION DOSE REDUCTION: This exam was performed according to the departmental dose-optimization program which includes automated exposure control, adjustment of the mA and/or kV according to patient size and/or use of iterative reconstruction technique. CONTRAST:  75mL OMNIPAQUE IOHEXOL 350 MG/ML SOLN COMPARISON:  Chest radiograph 12/12/2023.  Cardiac CT 07/21/2020 FINDINGS: Cardiovascular: Technically adequate study with moderately good opacification of the central and segmental pulmonary arteries. Focal filling defects are demonstrated in multiple bilateral lower lobe pulmonary arteries consistent with acute multifocal pulmonary embolus. Clot burden is low to moderate. Heart size is normal and RV to LV ratio measures 0.77 suggesting right heart strain to be unlikely. No pericardial effusions. Normal caliber thoracic aorta. No aortic dissection. Calcification of the aorta. Mediastinum/Nodes: Esophagus is  decompressed. Thyroid gland is unremarkable. Scattered mediastinal lymph nodes are not pathologically enlarged, likely reactive. Lungs/Pleura: Atelectasis or infiltration demonstrated in both lung bases. No pleural effusion or pneumothorax. Upper Abdomen: No acute abnormalities. Small renal cysts are seen, better demonstrated on prior CT abdomen and pelvis from 07/19/2023. No imaging follow-up is indicated. Musculoskeletal: Postoperative changes with anterior fixation of the lower cervical spine. No acute bony abnormalities. Review of the MIP images confirms the above findings. IMPRESSION: 1. Positive examination for bilateral lower lobe pulmonary emboli. No evidence of right heart strain. 2. Atelectasis or infiltration demonstrated in the lung bases. 3. Aortic atherosclerosis. Critical Value/emergent results were called by telephone at the time of interpretation on 12/24/2023 at 7:44 pm to provider [facility] , who verbally acknowledged these results. Electronically Signed   By: Burman Nieves M.D.   On: 12/24/2023 19:51    ASSESSMENT: DVT/PE  PLAN:    DVT/PE: Patient was diagnosed with left lower extremity DVT as well as bilateral PE in February 2025.  Full hypercoagulable workup was essentially negative, but patient noted to have elevated factor VIII level.  Elevated factor VIII level in the setting of acute blood clot and anticoagulation is likely clinically insignificant.  She had a possible transient risk factor with a viral illness in the weeks leading up to her blood clots, but no other obvious risk factors.  No further interventions are needed.  Recommend patient stay on Eliquis 5 mg twice per day for a total of 6 months completing treatment at the end of August 2025.  Patient was given a refill today.  After lengthy discussion, it was agreed upon that no further follow-up is necessary.  Please refer patient back if there are any questions or concerns. Hemoptysis/rectal bleeding: Likely  clinically insignificant.  Patient's most recent hemoglobin is within normal limits at 13.5.  I spent a total of 45 minutes reviewing chart data, face-to-face evaluation with the patient, counseling and coordination of care as detailed above.  Patient expressed understanding and was in agreement with this plan. She also understands that She can call clinic at any time with any questions, concerns, or complaints.     Jeralyn Ruths, MD   01/23/2024 4:00 PM

## 2024-01-24 NOTE — Telephone Encounter (Signed)
Patient confirmed upcoming apt.

## 2024-01-24 NOTE — Telephone Encounter (Signed)
 Left detailed message for patient to call back & confirm appointment for Monday 3/31 at 1:30 pm with Deanna.

## 2024-01-28 ENCOUNTER — Encounter: Payer: Self-pay | Admitting: Family

## 2024-01-28 ENCOUNTER — Ambulatory Visit: Admitting: Gastroenterology

## 2024-01-28 ENCOUNTER — Encounter: Payer: Self-pay | Admitting: Gastroenterology

## 2024-01-28 ENCOUNTER — Other Ambulatory Visit (INDEPENDENT_AMBULATORY_CARE_PROVIDER_SITE_OTHER)

## 2024-01-28 VITALS — BP 124/80 | HR 75 | Ht 65.0 in | Wt <= 1120 oz

## 2024-01-28 DIAGNOSIS — R1032 Left lower quadrant pain: Secondary | ICD-10-CM

## 2024-01-28 DIAGNOSIS — E876 Hypokalemia: Secondary | ICD-10-CM | POA: Diagnosis not present

## 2024-01-28 DIAGNOSIS — K625 Hemorrhage of anus and rectum: Secondary | ICD-10-CM | POA: Diagnosis not present

## 2024-01-28 DIAGNOSIS — R194 Change in bowel habit: Secondary | ICD-10-CM

## 2024-01-28 LAB — CBC WITH DIFFERENTIAL/PLATELET
Basophils Absolute: 0 10*3/uL (ref 0.0–0.1)
Basophils Relative: 0.3 % (ref 0.0–3.0)
Eosinophils Absolute: 0.2 10*3/uL (ref 0.0–0.7)
Eosinophils Relative: 3.4 % (ref 0.0–5.0)
HCT: 41.9 % (ref 36.0–46.0)
Hemoglobin: 13.8 g/dL (ref 12.0–15.0)
Lymphocytes Relative: 27.9 % (ref 12.0–46.0)
Lymphs Abs: 2 10*3/uL (ref 0.7–4.0)
MCHC: 32.9 g/dL (ref 30.0–36.0)
MCV: 91.1 fl (ref 78.0–100.0)
Monocytes Absolute: 0.8 10*3/uL (ref 0.1–1.0)
Monocytes Relative: 10.6 % (ref 3.0–12.0)
Neutro Abs: 4.2 10*3/uL (ref 1.4–7.7)
Neutrophils Relative %: 57.8 % (ref 43.0–77.0)
Platelets: 415 10*3/uL — ABNORMAL HIGH (ref 150.0–400.0)
RBC: 4.6 Mil/uL (ref 3.87–5.11)
RDW: 14.6 % (ref 11.5–15.5)
WBC: 7.2 10*3/uL (ref 4.0–10.5)

## 2024-01-28 LAB — BASIC METABOLIC PANEL WITH GFR
BUN: 9 mg/dL (ref 6–23)
CO2: 28 meq/L (ref 19–32)
Calcium: 9.6 mg/dL (ref 8.4–10.5)
Chloride: 106 meq/L (ref 96–112)
Creatinine, Ser: 0.89 mg/dL (ref 0.40–1.20)
GFR: 64.1 mL/min (ref >60.00)
Glucose, Bld: 91 mg/dL (ref 70–99)
Potassium: 3.5 meq/L (ref 3.5–5.1)
Sodium: 142 meq/L (ref 135–145)

## 2024-01-28 LAB — C-REACTIVE PROTEIN: CRP: <1 mg/dL (ref 0.5–20.0)

## 2024-01-28 LAB — SEDIMENTATION RATE: Sed Rate: 20 mm/h (ref 0–30)

## 2024-01-28 NOTE — Patient Instructions (Signed)
 Your provider has requested that you go to the basement level for lab work before leaving today. Press "B" on the elevator. The lab is located at the first door on the left as you exit the elevator.  Due to recent changes in healthcare laws, you may see the results of your imaging and laboratory studies on MyChart before your provider has had a chance to review them.  We understand that in some cases there may be results that are confusing or concerning to you. Not all laboratory results come back in the same time frame and the provider may be waiting for multiple results in order to interpret others.  Please give [place] 48 hours in order for your provider to thoroughly review all the results before contacting the office for clarification of your results.    You have been scheduled for a flexible sigmoidoscopy. Please follow the written instructions given to you at your visit today.  If you use inhalers (even only as needed), please bring them with you on the day of your procedure.  DO NOT TAKE 7 DAYS PRIOR TO TEST- Trulicity (dulaglutide) Ozempic, Wegovy (semaglutide) Mounjaro (tirzepatide) Bydureon Bcise (exanatide extended release)  DO NOT TAKE 1 DAY PRIOR TO YOUR TEST Rybelsus (semaglutide) Adlyxin (lixisenatide) Victoza (liraglutide) Byetta (exanatide) _______________________________________________________________________  Thank you for trusting me with your gastrointestinal care!   Deanna May, NP     _______________________________________________________  If your blood pressure at your visit was 140/90 or greater, please contact your primary care physician to follow up on this.  _______________________________________________________  If you are age 74 or older, your body mass index should be between 23-30. Your Body mass index is 2.83 kg/m. If this is out of the aforementioned range listed, please consider follow up with your Primary Care Provider.  If you are age 66 or  younger, your body mass index should be between 19-25. Your Body mass index is 2.83 kg/m. If this is out of the aformentioned range listed, please consider follow up with your Primary Care Provider.   ________________________________________________________  The [facility] providers would like to encourage you to use [facility] to communicate with providers for non-urgent requests or questions.  Due to long hold times on the telephone, sending your provider a message by [facility] may be a faster and more efficient way to get a response.  Please allow 48 business hours for a response.  Please remember that this is for non-urgent requests.  _______________________________________________________

## 2024-01-28 NOTE — Progress Notes (Signed)
 Chief Complaint:rectal bleeding Primary GI Doctor:Dr. Leonides Schanz  HPI: 73 year old female history of hypothyroidism, BPPV, PFO, tubular adenomas,history of polyps and diverticulosis noted on colonoscopy last year, DVT/PE LLE currently on apixaban for less than a month (started 12/24/23 after ED diagnosis)  presents for evaluation of rectal bleeding.  Patient states in August she had a sinus infection and was given a 14-day course of Augmentin.  Midway through the course of Augmentin she developed diarrhea and associated with her diarrhea she noticed a "red jelly" substance that would come out in the stool and on the tissue paper.  She does have history of internal hemorrhoids and has had banding in the past.   She came in for lab work 07/19/2023 with normal CBC and CMP.  Since she was having LLQ pain CT abdomen pelvis with contrast 07/19/2023 was obtained and showed diverticulosis without acute diverticulitis.   She continues to have 3-4 stools per day.  Occasional fecal incontinence with urinating.  Stools vary from watery to "sand" to sometimes pebbles.  She has associated LLQ pain that is relieved with a bowel movement.  She typically will see this "red jelly substance" with each bowel movement. Reports some nausea, no vomiting.  Denies unintentional weight loss.  08/09/23 Stool study via diatherix is negative for infections including C diff   08/27/2023 patient last seen in GI office with Dr. Leonides Schanz for hemorrhoid banding. At that time patient was still having diarrhea with 3-4 bowel movements per day for the last couple of months after her colonoscopy.  She does have some rectal bleeding and rectal pain.  Anusol suppositories have not really helped.  Dr. Leonides Schanz recommended trial of Imodium 2 mg as needed to help with diarrhea.  01/11/2024 patient seen in ED for complaints of rectal bleeding. She has developed some abdominal pain, intermittent, waxing and waning, usually worse on the left side.  Hemoccult performed, but no stool in rectal vault. Patient has pictures of stool that show obvious bleeding, red blood. Hemoccult not sent due to poor sample. CT, no obvious infection. Stable renal cyst. I would recommend IVC filter eval. Can resume eliquis (at least) low dose from tomorrow.  Labs show: Potassium 3.2, BUN 10, creatinine 0.89, normal LFTs, WBC 6.2, hemoglobin 14, platelets 419  01/19/2024 patient seen in ED for left leg pain.  Patient did states she fell off a curb a couple days ago.Bilateral LE [place], which revealed DVT in L tibial and perioneal veins which is improved from prior US, no new clot burden visualized.  01/23/2024 patient seen by oncology to evaluate for elevated factor VIII level in the setting of acute blood clot and anticoagulation.  Recommend patient stay on Eliquis 5 mg twice daily per day for total of 6 months completing treatment at the end of August.   Interval History    Patient presents with main complaint of rectal bleeding with mucus for the past 6 weeks. She notes sometimes she will note no stool just blood clots with wiping. She reports intermittent episodes of LLQ abdominal pain. Denies constipation. When she has stools they are typically pebble like or loose. She states she has had a few episodes where she goes to defecate and feels like she's going to pass out.  Wt Readings from Last 3 Encounters:  01/28/24 17 lb (7.711 kg)  01/23/24 170 lb (77.1 kg)  01/19/24 169 lb (76.7 kg)    Past Medical History:  Diagnosis Date   Abdominal pain, right lower quadrant  Acquired cyst of kidney 01/08/2012   pt denies history of    Acute sinusitis, unspecified    Adenomatous colon polyp    Allergy    Angiomyolipoma    kidney   Anxiety    OCCASIONAL   Cataract    Chest pain    Chest pain, unspecified    Depressive disorder, not elsewhere classified 01/08/2012   not currently    Diverticulosis    Dizziness and giddiness    Dysfunction of eustachian tube     H/O Spinal surgery 02/20/2017   Hemorrhoids    History of diverticulitis of colon    History of fusion of cervical spine 04/25/2018   IBS (irritable bowel syndrome)    Liver lesion 2011   Neoplasm of skin of face    malignant   OA (osteoarthritis)    knee, NECK   Obesity, unspecified    Ostium secundum type atrial septal defect    Other and unspecified hyperlipidemia    Other malaise and fatigue    Palpitations    Shortness of breath    Thyroid disease    Unspecified essential hypertension    pt states she does not have HTN   Unspecified sinusitis (chronic)    Unspecified vitamin D deficiency     Past Surgical History:  Procedure Laterality Date   ABDOMINAL HYSTERECTOMY     BREAST REDUCTION SURGERY  1990   CERVICAL DISC SURGERY     Dr.Nudleman   CHOLECYSTECTOMY     COLONOSCOPY     DILATION AND CURETTAGE OF UTERUS     EYE SURGERY Bilateral 07/30/2017   cataract sx   FLEXIBLE SIGMOIDOSCOPY  01/08/2012   Procedure: FLEXIBLE SIGMOIDOSCOPY;  Surgeon: Hart Carwin, MD;  Location: [facility];  Service: Endoscopy;  Laterality: N/A;   HEMORRHOID SURGERY     KNEE ARTHROTOMY     pt states no    LAMINECTOMY N/A 01/31/2017   Procedure: Thoracic Laminectomy and marsupialization of arachnoid cyst;  Surgeon: Shirlean Kelly, MD;  Location[facility] OR;  Service: Neurosurgery;  Laterality: N/A;   LEFT OOPHORECTOMY  1976   NECK SURGERY N/A 03/2018   PARTIAL HYSTERECTOMY     right ovary remains   REDUCTION MAMMAPLASTY     TONSILLECTOMY  1972   UPPER GASTROINTESTINAL ENDOSCOPY      Current Outpatient Medications  Medication Sig Dispense Refill   acetaminophen (TYLENOL) 325 MG tablet Take 650 mg by mouth every 6 (six) hours as needed.     albuterol (VENTOLIN HFA) 108 (90 Base) MCG/ACT inhaler Inhale 2 puffs into the lungs every 6 (six) hours as needed for wheezing or shortness of breath. 8 g 2   apixaban (ELIQUIS) 5 MG TABS tablet Take 1 tablet (5 mg total) by mouth 2 (two) times daily.  60 tablet 6   chlorpheniramine (ALLERGY RELIEF) 4 MG tablet Take 1 tablet (4 mg total) by mouth 2 (two) times daily as needed for allergies.     Cholecalciferol (VITAMIN D3) 2000 units TABS Take 2,000 Units by mouth daily.     Cyanocobalamin (VITAMIN B 12 PO) Take by mouth.     ezetimibe (ZETIA) 10 MG tablet Take 1 tablet (10 mg total) by mouth daily. 90 tablet 3   fluticasone (FLONASE) 50 MCG/ACT nasal spray Place 1 spray into both nostrils as needed for allergies.      levothyroxine (SYNTHROID) 75 MCG tablet Take 1 tablet (75 mcg total) by mouth daily. 90 tablet 3   meclizine (ANTIVERT) 12.5  MG tablet Take 1 tablet (12.5 mg total) by mouth 3 (three) times daily as needed for dizziness. 30 tablet 0   OVER THE COUNTER MEDICATION Sinus and pain, take PRN.     OVER THE COUNTER MEDICATION Pro-biotic one capsule daily.     pantoprazole (PROTONIX) 20 MG tablet Take 1 tablet (20 mg total) by mouth daily. (Patient not taking: Reported on 01/28/2024) 30 tablet 0   potassium chloride (KLOR-CON M) 10 MEQ tablet Take 1 tablet (10 mEq total) by mouth 2 (two) times daily for 5 days. 10 tablet 0   No current facility-administered medications for this visit.    Allergies as of 01/28/2024 - Review Complete 01/28/2024  Allergen Reaction Noted   Simvastatin Other (See Comments) 01/27/2011   Statins Other (See Comments) 05/14/2017   Bactrim [sulfamethoxazole-trimethoprim] Itching 11/12/2013   Sertraline Anxiety 04/10/2019    Family History  Problem Relation Age of Onset   Hypertension Mother    Diabetes Mother    Cirrhosis Father    Heart disease Father    Hypertension Sister    Diabetes Sister    Breast cancer Cousin 25 - 75   Diabetes Cousin    Heart disease Brother        x 2   Diabetes Other        aunt   Stroke Other        aunt   Colon cancer Neg Hx    Esophageal cancer Neg Hx    Stomach cancer Neg Hx    Rectal cancer Neg Hx     Review of Systems:    Constitutional: No weight loss,  fever, chills, weakness or fatigue HEENT: Eyes: No change in vision               Ears, Nose, Throat:  No change in hearing or congestion Skin: No rash or itching Cardiovascular: No chest pain, chest pressure or palpitations   Respiratory: No SOB or cough Gastrointestinal: See HPI and otherwise negative Genitourinary: No dysuria or change in urinary frequency Neurological: No headache, dizziness or syncope Musculoskeletal: No new muscle or joint pain Hematologic: No bleeding or bruising Psychiatric: No history of depression or anxiety    Physical Exam:  Vital signs: BP 124/80   Pulse 75   Ht 5\' 5"  (1.651 m)   Wt 17 lb (7.711 kg)   BMI 2.83 kg/m   Constitutional:   Pleasant  female appears to be in NAD, Well developed, Well nourished, alert and cooperative Throat: Oral cavity and pharynx without inflammation, swelling or lesion.  Respiratory: Respirations even and unlabored. Lungs clear to auscultation bilaterally.   No wheezes, crackles, or rhonchi.  Cardiovascular: Normal S1, S2. Regular rate and rhythm. No peripheral edema, cyanosis or pallor.  Gastrointestinal:  Soft, nondistended, nontender. No rebound or guarding. Normal bowel sounds. No appreciable masses or hepatomegaly. Rectal: Normal external rectal exam, normal rectal tone, non-tender, no masses, , liquid brown/red stool with mucus noted on anoscope and after DRE leaked from rectum Anoscopy: loose bloody mucous stool in vault Msk:  Symmetrical without gross deformities. Without edema, no deformity or joint abnormality.  Neurologic:  Alert and  oriented x4;  grossly normal neurologically.  Skin:   Dry and intact without significant lesions or rashes. Psychiatric: Oriented to person, place and time. Demonstrates good judgement and reason without abnormal affect or behaviors.  RELEVANT LABS AND IMAGING: CBC    Latest Ref Rng & Units 01/19/2024    5:52 PM 01/15/2024  1:47 PM 01/11/2024   10:46 AM  CBC  WBC 4.0 - 10.5  K/uL 5.9  6.0  6.2   Hemoglobin 12.0 - 15.0 g/dL 16.1  09.6  04.5   Hematocrit 36.0 - 46.0 % 41.3  41.6  42.5   Platelets 150 - 400 K/uL 352  408.0  419      CMP     Latest Ref Rng & Units 01/19/2024    5:52 PM 01/15/2024    1:47 PM 01/11/2024   10:46 AM  CMP  Glucose 70 - 99 mg/dL 97  409  94   BUN 8 - 23 mg/dL 8  10  10    Creatinine 0.44 - 1.00 mg/dL 8.11  9.14  7.82   Sodium 135 - 145 mmol/L 142  139  140   Potassium 3.5 - 5.1 mmol/L 3.2  3.2  3.2   Chloride 98 - 111 mmol/L 110  102  105   CO2 22 - 32 mmol/L 26  28  27    Calcium 8.9 - 10.3 mg/dL 9.2  9.4  9.9   Total Protein 6.5 - 8.1 g/dL   6.9   Total Bilirubin 0.0 - 1.2 mg/dL   0.6   Alkaline Phos 38 - 126 U/L   85   AST 15 - 41 U/L   17   ALT 0 - 44 U/L   14      Lab Results  Component Value Date   TSH 1.30 10/11/2023   Previous GI workup: 01/11/24 CT Abd/pelvis IMPRESSION: 1. No acute inflammatory process identified within the abdomen or pelvis. 2. Multiple other nonacute observations, as described above. Colonoscopy 05/08/2023 for personal history of colon polyps and positive fit test - The examined portion of the ileum was normal.  - Four 3 to 6 mm polyps (tubular adenomas) in the ascending colon and in the cecum, removed with a cold snare. Resected and retrieved.  - Diverticulosis in the sigmoid colon. - One 3 mm polyp (polyploid benign mucosa) in the sigmoid colon, removed with a cold snare. Resected and retrieved. - Non- bleeding external and internal hemorrhoids.  Path: Diagnosis 1. Surgical [P], colon, ascending and cecum, polyp (4) - TUBULAR ADENOMA(S). - NO HIGH GRADE DYSPLASIA OR MALIGNANCY. - POLYPOID FRAGMENT OF BENIGN COLONIC MUCOSA WITH LYMPHOID AGGREGATE 2. Surgical [P], colon, sigmoid, polyp (1) - POLYPOID FRAGMENT OF BENIGN COLONIC MUCOSA WITH LYMPHOID AGGREGATE  Colonoscopy 06/16/2021 with Dr. Orvan Falconer for personal history of colon polyps - Hemorrhoids found on perianal exam.  - Non- bleeding  internal hemorrhoids.  - Diverticulosis in the sigmoid colon and in the descending colon.  - One less than 1 mm polyp in the descending colon, removed with a cold biopsy forceps. Resected and retrieved.  - Three 2 to 3 mm polyps in the ascending colon, removed with a cold snare. Resected and retrieved.  - Two 1 to 2 mm polyps in the cecum, removed with a cold snare. Resected and retrieved.  - The examination was otherwise normal on direct and retroflexion views. - repeat 3 years Diagnosis 1. Surgical [P], colon, cecum, polyp (2) - TUBULAR ADENOMA WITHOUT HIGH-GRADE DYSPLASIA OR MALIGNANCY - OTHER FRAGMENT OF POLYPOID COLONIC MUCOSA WITH PROMINENT LYMPHOID AGGREGATES 2. Surgical [P], colon, ascending, polyp (3) - TUBULAR ADENOMA(S) WITHOUT HIGH-GRADE DYSPLASIA OR MALIGNANCY 3. Surgical [P], colon, descending, polyp (1) - BENIGN LEIOMYOMA  Assessment: Encounter Diagnoses  Name Primary?   Rectal bleeding Yes   Altered bowel habits    LLQ abdominal  pain       73 year old female patient with main complaint of rectal bleeding, altered bowel habits, and LLQ abdominal pain for past 6 weeks.Hgb stable 13.5 (baseline 13-14) Normal CT scan 01/11/24, UTD colonoscopy 04/2023, upon exam there was mucous blood stool noted in vault and on anoscope. Discussed case with Dr. Leonides Schanz will proceed with checking inflammatory markers today and recheck CBC. Will also order flexible sigmoidoscopy to evaluate for inflammation or IBD.  Plan: - check CRP, ESR, CBC today - Schedule flex sig with Dr. Leonides Schanz , ok to stay on Eliquis  Thank you for the courtesy of this consult. Please call me with any questions or concerns.   Wilbern Pennypacker, FNP-C [facility] 01/28/2024, 3:21 PM  Cc: Mort Sawyers, FNP

## 2024-01-29 ENCOUNTER — Ambulatory Visit (AMBULATORY_SURGERY_CENTER): Admitting: Internal Medicine

## 2024-01-29 ENCOUNTER — Other Ambulatory Visit: Payer: Self-pay | Admitting: Internal Medicine

## 2024-01-29 ENCOUNTER — Telehealth: Payer: Self-pay | Admitting: Internal Medicine

## 2024-01-29 ENCOUNTER — Encounter: Payer: Self-pay | Admitting: Internal Medicine

## 2024-01-29 VITALS — BP 129/66 | HR 69 | Temp 97.3°F | Resp 17 | Ht 65.0 in | Wt 170.0 lb

## 2024-01-29 DIAGNOSIS — K5730 Diverticulosis of large intestine without perforation or abscess without bleeding: Secondary | ICD-10-CM

## 2024-01-29 DIAGNOSIS — K626 Ulcer of anus and rectum: Secondary | ICD-10-CM | POA: Diagnosis not present

## 2024-01-29 DIAGNOSIS — K6389 Other specified diseases of intestine: Secondary | ICD-10-CM

## 2024-01-29 DIAGNOSIS — F419 Anxiety disorder, unspecified: Secondary | ICD-10-CM | POA: Diagnosis not present

## 2024-01-29 DIAGNOSIS — R1032 Left lower quadrant pain: Secondary | ICD-10-CM | POA: Diagnosis not present

## 2024-01-29 DIAGNOSIS — E669 Obesity, unspecified: Secondary | ICD-10-CM | POA: Diagnosis not present

## 2024-01-29 DIAGNOSIS — F329 Major depressive disorder, single episode, unspecified: Secondary | ICD-10-CM | POA: Diagnosis not present

## 2024-01-29 DIAGNOSIS — R194 Change in bowel habit: Secondary | ICD-10-CM | POA: Diagnosis not present

## 2024-01-29 DIAGNOSIS — K648 Other hemorrhoids: Secondary | ICD-10-CM

## 2024-01-29 DIAGNOSIS — K625 Hemorrhage of anus and rectum: Secondary | ICD-10-CM | POA: Diagnosis not present

## 2024-01-29 MED ORDER — MESALAMINE 4 G RE ENEM: 4.0000 g | ENEMA | Freq: Every day | RECTAL | Status: DC

## 2024-01-29 MED ORDER — SODIUM CHLORIDE 0.9 % IV SOLN: 500.0000 mL | Freq: Once | INTRAVENOUS | Status: DC

## 2024-01-29 NOTE — Op Note (Signed)
 [facility] Patient Name: Nicole Daniels Procedure Date: 01/29/2024 11:13 AM MRN: [id number] Endoscopist: Particia Lather , , [id number] Age: 73 Referring MD:  Date of Birth: [birth year] Gender: Female Account #: [id number] Procedure:                Flexible Sigmoidoscopy Indications:              Rectal hemorrhage Medicines:                Monitored Anesthesia Care Procedure:                Pre-Anesthesia Assessment:                           - Prior to the procedure, a History and Physical                            was performed, and patient medications and                            allergies were reviewed. The patient's tolerance of                            previous anesthesia was also reviewed. The risks                            and benefits of the procedure and the sedation                            options and risks were discussed with the patient.                            All questions were answered, and informed consent                            was obtained. Prior Anticoagulants: The patient has                            taken Eliquis (apixaban), last dose was day of                            procedure. ASA Grade Assessment: II - A patient                            with mild systemic disease. After reviewing the                            risks and benefits, the patient was deemed in                            satisfactory condition to undergo the procedure.                           After obtaining informed consent, the scope was  passed under direct vision. The GIF [id number]                            was introduced through the anus and advanced to the                            the left transverse colon. The flexible                            sigmoidoscopy was accomplished without difficulty.                            The patient tolerated the procedure well. Scope In: 11:17:40 AM Scope Out: 11:31:35 AM Total  Procedure Duration: 0 hours 13 minutes 55 seconds  Findings:                 Diffuse inflammation characterized by congestion                            (edema), erosions, erythema, friability and loss of                            vascularity was found in the rectum, in the sigmoid                            colon and in the descending colon. Inflammation was                            present starting at about 45 cm from the anal verge                            and was present more distally. Inflammation seemed                            the worst in the rectum and sigmoid colon.                            Transverse colon was normal. Biopsies were taken                            with a cold forceps for histology.                           Multiple diverticula were found in the sigmoid                            colon.                           Non-bleeding internal hemorrhoids were found during                            retroflexion. Complications:            No immediate complications. Estimated Blood Loss:  Estimated blood loss was minimal. Impression:               - Diffuse inflammation was found in the rectum, in                            the sigmoid colon and in the descending colon.                            Biopsied.                           - Diverticulosis in the sigmoid colon.                           - Non-bleeding internal hemorrhoids. Recommendation:           - Discharge patient to home (with escort).                           - Based upon the appearance of your inflammation, I                            am suspicious that you may have inflammatory bowel                            disease.                           - Await pathology results.                           - Start Rowasa enemas 4 grams nightly.                           - Return to GI clinic in 2 months.                           - The findings and recommendations were discussed                             with the patient. Dr Particia Lather "Alan Ripper" Leonides Schanz,  01/29/2024 11:43:17 AM

## 2024-01-29 NOTE — Progress Notes (Signed)
 I agree with the assessment and plan as outlined by Ms. May.

## 2024-01-29 NOTE — Progress Notes (Signed)
 Pt's states no medical or surgical changes since previsit or office visit.

## 2024-01-29 NOTE — Progress Notes (Signed)
 GASTROENTEROLOGY PROCEDURE H&P NOTE   Primary Care Physician: Mort Sawyers, FNP    Reason for Procedure:   Rectal bleeding  Plan:    Flexible sigmoidoscopy  Patient is appropriate for endoscopic procedure(s) in the ambulatory ([facility]) setting.  The nature of the procedure, as well as the risks, benefits, and alternatives were carefully and thoroughly reviewed with the patient. Ample time for discussion and questions allowed. The patient understood, was satisfied, and agreed to proceed.     HPI: Nicole Daniels is a 73 y.o. female who presents for flexible sigmoidoscopy for evaluation of rectal bleeding.  Patient was most recently seen in the Gastroenterology Clinic on 01/28/24.  No interval change in medical history since that appointment. Please refer to that note for full details regarding GI history and clinical presentation.   Past Medical History:  Diagnosis Date   Abdominal pain, right lower quadrant    Acquired cyst of kidney 01/08/2012   pt denies history of    Acute sinusitis, unspecified    Adenomatous colon polyp    Allergy    Angiomyolipoma    kidney   Anxiety    OCCASIONAL   Cataract    Chest pain    Chest pain, unspecified    Depressive disorder, not elsewhere classified 01/08/2012   not currently    Diverticulosis    Dizziness and giddiness    DVT (deep venous thrombosis) (HCC)    Dysfunction of eustachian tube    H/O Spinal surgery 02/20/2017   Hemorrhoids    History of diverticulitis of colon    History of fusion of cervical spine 04/25/2018   IBS (irritable bowel syndrome)    Liver lesion 2011   Neoplasm of skin of face    malignant   OA (osteoarthritis)    knee, NECK   Obesity, unspecified    Ostium secundum type atrial septal defect    Other and unspecified hyperlipidemia    Other malaise and fatigue    Palpitations    Pulmonary embolism (HCC)    Shortness of breath    Thyroid disease    Unspecified essential hypertension    pt  states she does not have HTN   Unspecified sinusitis (chronic)    Unspecified vitamin D deficiency     Past Surgical History:  Procedure Laterality Date   ABDOMINAL HYSTERECTOMY     BREAST REDUCTION SURGERY  1990   CERVICAL DISC SURGERY     Dr.Nudleman   CHOLECYSTECTOMY     COLONOSCOPY     DILATION AND CURETTAGE OF UTERUS     EYE SURGERY Bilateral 07/30/2017   cataract sx   FLEXIBLE SIGMOIDOSCOPY  01/08/2012   Procedure: FLEXIBLE SIGMOIDOSCOPY;  Surgeon: Hart Carwin, MD;  Location: [facility];  Service: Endoscopy;  Laterality: N/A;   HEMORRHOID SURGERY     KNEE ARTHROTOMY     pt states no    LAMINECTOMY N/A 01/31/2017   Procedure: Thoracic Laminectomy and marsupialization of arachnoid cyst;  Surgeon: Shirlean Kelly, MD;  Location[facility];  Service: Neurosurgery;  Laterality: N/A;   LEFT OOPHORECTOMY  1976   NECK SURGERY N/A 03/2018   PARTIAL HYSTERECTOMY     right ovary remains   REDUCTION MAMMAPLASTY     TONSILLECTOMY  1972   UPPER GASTROINTESTINAL ENDOSCOPY      Prior to Admission medications   Medication Sig Start Date End Date Taking? Authorizing Provider  acetaminophen (TYLENOL) 325 MG tablet Take 650 mg by mouth every 6 (six) hours  as needed.    [provider]  albuterol (VENTOLIN HFA) 108 (90 Base) MCG/ACT inhaler Inhale 2 puffs into the lungs every 6 (six) hours as needed for wheezing or shortness of breath. 12/12/23   Bedsole, Amy E, MD  apixaban (ELIQUIS) 5 MG TABS tablet Take 1 tablet (5 mg total) by mouth 2 (two) times daily. 01/23/24   Jeralyn Ruths, MD  chlorpheniramine (ALLERGY RELIEF) 4 MG tablet Take 1 tablet (4 mg total) by mouth 2 (two) times daily as needed for allergies. 10/11/23   Mort Sawyers, FNP  Cholecalciferol (VITAMIN D3) 2000 units TABS Take 2,000 Units by mouth daily.    [provider]  Cyanocobalamin (VITAMIN B 12 PO) Take by mouth.    [provider]  ezetimibe (ZETIA) 10 MG tablet Take 1 tablet (10 mg  total) by mouth daily. 11/07/23   Mort Sawyers, FNP  fluticasone (FLONASE) 50 MCG/ACT nasal spray Place 1 spray into both nostrils as needed for allergies.     [provider]  levothyroxine (SYNTHROID) 75 MCG tablet Take 1 tablet (75 mcg total) by mouth daily. 11/07/23   Mort Sawyers, FNP  meclizine (ANTIVERT) 12.5 MG tablet Take 1 tablet (12.5 mg total) by mouth 3 (three) times daily as needed for dizziness. 03/21/23   Mort Sawyers, FNP  OVER THE COUNTER MEDICATION Sinus and pain, take PRN.    [provider]  OVER THE COUNTER MEDICATION Pro-biotic one capsule daily.    [provider]  pantoprazole (PROTONIX) 20 MG tablet Take 1 tablet (20 mg total) by mouth daily. Patient not taking: Reported on 01/28/2024 01/14/24   Mort Sawyers, FNP  potassium chloride (KLOR-CON M) 10 MEQ tablet Take 1 tablet (10 mEq total) by mouth 2 (two) times daily for 5 days. 01/16/24 01/23/24  Mort Sawyers, FNP    Current Outpatient Medications  Medication Sig Dispense Refill   acetaminophen (TYLENOL) 325 MG tablet Take 650 mg by mouth every 6 (six) hours as needed.     albuterol (VENTOLIN HFA) 108 (90 Base) MCG/ACT inhaler Inhale 2 puffs into the lungs every 6 (six) hours as needed for wheezing or shortness of breath. 8 g 2   apixaban (ELIQUIS) 5 MG TABS tablet Take 1 tablet (5 mg total) by mouth 2 (two) times daily. 60 tablet 6   chlorpheniramine (ALLERGY RELIEF) 4 MG tablet Take 1 tablet (4 mg total) by mouth 2 (two) times daily as needed for allergies.     Cholecalciferol (VITAMIN D3) 2000 units TABS Take 2,000 Units by mouth daily.     Cyanocobalamin (VITAMIN B 12 PO) Take by mouth.     ezetimibe (ZETIA) 10 MG tablet Take 1 tablet (10 mg total) by mouth daily. 90 tablet 3   fluticasone (FLONASE) 50 MCG/ACT nasal spray Place 1 spray into both nostrils as needed for allergies.      levothyroxine (SYNTHROID) 75 MCG tablet Take 1 tablet (75 mcg total) by mouth daily. 90 tablet 3    meclizine (ANTIVERT) 12.5 MG tablet Take 1 tablet (12.5 mg total) by mouth 3 (three) times daily as needed for dizziness. 30 tablet 0   OVER THE COUNTER MEDICATION Sinus and pain, take PRN.     OVER THE COUNTER MEDICATION Pro-biotic one capsule daily.     pantoprazole (PROTONIX) 20 MG tablet Take 1 tablet (20 mg total) by mouth daily. (Patient not taking: Reported on 01/28/2024) 30 tablet 0   potassium chloride (KLOR-CON M) 10 MEQ tablet Take 1 tablet (  10 mEq total) by mouth 2 (two) times daily for 5 days. 10 tablet 0   Current Facility-Administered Medications  Medication Dose Route Frequency Provider Last Rate Last Admin   0.9 %  sodium chloride infusion  500 mL Intravenous Once Imogene Burn, MD        Allergies as of 01/29/2024 - Review Complete 01/29/2024  Allergen Reaction Noted   Simvastatin Other (See Comments) 01/27/2011   Statins Other (See Comments) 05/14/2017   Bactrim [sulfamethoxazole-trimethoprim] Itching 11/12/2013   Sertraline Anxiety 04/10/2019    Family History  Problem Relation Age of Onset   Hypertension Mother    Diabetes Mother    Cirrhosis Father    Heart disease Father    Hypertension Sister    Diabetes Sister    Heart disease Brother        x 2   Breast cancer Cousin 62 - 20   Diabetes Cousin    Diabetes Other        aunt   Stroke Other        aunt   Colon cancer Neg Hx    Esophageal cancer Neg Hx    Stomach cancer Neg Hx    Rectal cancer Neg Hx    Colon polyps Neg Hx     Social History   Socioeconomic History   Marital status: Widowed    Spouse name: Not on file   Number of children: 2   Years of education: Not on file   Highest education level: 12th grade  Occupational History   Occupation: retired  Tobacco Use   Smoking status: Never   Smokeless tobacco: Never  Vaping Use   Vaping status: Never Used  Substance and Sexual Activity   Alcohol use: No   Drug use: No   Sexual activity: Yes    Partners: Male  Other Topics Concern    Not on file  Social History Narrative   Daily caffeine    Social Drivers of Health   Financial Resource Strain: Low Risk  (12/24/2023)   Overall Financial Resource Strain (CARDIA)    Difficulty of Paying Living Expenses: Not hard at all  Food Insecurity: No Food Insecurity (01/23/2024)   Hunger Vital Sign    Worried About Running Out of Food in the Last Year: Never true    Ran Out of Food in the Last Year: Never true  Transportation Needs: No Transportation Needs (08/27/2023)   PRAPARE - Administrator, Civil Service (Medical): No    Lack of Transportation (Non-Medical): No  Physical Activity: Insufficiently Active (12/24/2023)   Exercise Vital Sign    Days of Exercise per Week: 2 days    Minutes of Exercise per Session: 20 min  Stress: No Stress Concern Present (12/24/2023)   [employer] of Occupational Health - Occupational Stress Questionnaire    Feeling of Stress : Only a little  Social Connections: Moderately Integrated (12/24/2023)   Social Connection and Isolation Panel [NHANES]    Frequency of Communication with Friends and Family: More than three times a week    Frequency of Social Gatherings with Friends and Family: Three times a week    Attends Religious Services: More than 4 times per year    Active Member of Clubs or Organizations: Yes    Attends Banker Meetings: More than 4 times per year    Marital Status: Widowed  Intimate Partner Violence: Not At Risk (01/23/2024)   Humiliation, Afraid, Rape, and Kick questionnaire  Fear of Current or Ex-Partner: No    Emotionally Abused: No    Physically Abused: No    Sexually Abused: No    Physical Exam: Vital signs in last 24 hours: BP (!) 143/77   Pulse 67   Temp (!) 97.3 F (36.3 C)   Ht 5\' 5"  (1.651 m)   Wt 170 lb (77.1 kg)   SpO2 95%   BMI 28.29 kg/m  GEN: NAD EYE: Sclerae anicteric ENT: MMM CV: Non-tachycardic Pulm: No increased WOB GI: Soft NEURO:  Alert &  Oriented   Eulah Pont, MD [facility]   01/29/2024 11:02 AM

## 2024-01-29 NOTE — Progress Notes (Signed)
 Sedate, gd SR, tolerated procedure well, VSS, report to RN

## 2024-01-29 NOTE — Patient Instructions (Addendum)
 -Handout on diverticulosis and hemorrhoids  provided -await pathology results   -Continue present medications start Rowasa enema 4 grams nightly for 3 months.  YOU HAD AN ENDOSCOPIC PROCEDURE TODAY AT THE [facility]:   Refer to the procedure report that was given to you for any specific questions about what was found during the examination.  If the procedure report does not answer your questions, please call your gastroenterologist to clarify.  If you requested that your care partner not be given the details of your procedure findings, then the procedure report has been included in a sealed envelope for you to review at your convenience later.  YOU SHOULD EXPECT: Some feelings of bloating in the abdomen. Passage of more gas than usual.  Walking can help get rid of the air that was put into your GI tract during the procedure and reduce the bloating. If you had a lower endoscopy (such as a colonoscopy or flexible sigmoidoscopy) you may notice spotting of blood in your stool or on the toilet paper. If you underwent a bowel prep for your procedure, you may not have a normal bowel movement for a few days.  Please Note:  You might notice some irritation and congestion in your nose or some drainage.  This is from the oxygen used during your procedure.  There is no need for concern and it should clear up in a day or so.  SYMPTOMS TO REPORT IMMEDIATELY:  Following lower endoscopy (colonoscopy or flexible sigmoidoscopy):  Excessive amounts of blood in the stool  Significant tenderness or worsening of abdominal pains  Swelling of the abdomen that is new, acute   For urgent or emergent issues, a gastroenterologist can be reached at any hour by calling [phone]. Do not use MyChart messaging for urgent concerns.    DIET:  We do recommend a small meal at first, but then you may proceed to your regular diet.  Drink plenty of fluids but you should avoid alcoholic beverages for 24  hours.  ACTIVITY:  You should plan to take it easy for the rest of today and you should NOT DRIVE or use heavy machinery until tomorrow (because of the sedation medicines used during the test).    FOLLOW UP: Our staff will call the number listed on your records the next business day following your procedure.  We will call around 7:15- 8:00 am to check on you and address any questions or concerns that you may have regarding the information given to you following your procedure. If we do not reach you, we will leave a message.     If any biopsies were taken you will be contacted by phone or by letter within the next 1-3 weeks.  Please call us at [phone] if you have not heard about the biopsies in 3 weeks.    SIGNATURES/CONFIDENTIALITY: You and/or your care partner have signed paperwork which will be entered into your electronic medical record.  These signatures attest to the fact that that the information above on your After Visit Summary has been reviewed and is understood.  Full responsibility of the confidentiality of this discharge information lies with you and/or your care-partner.   Hemorrhoids Hemorrhoids are swollen veins that may form: In the butt (rectum). These are called internal hemorrhoids. Around the opening of the butt (anus). These are called external hemorrhoids. Most hemorrhoids do not cause very bad problems. They often get better with changes to your lifestyle and what you eat. What are the  causes? Having trouble pooping (constipation) or watery poop (diarrhea). Pushing too hard when you poop. Pregnancy. Being very overweight (obese). Sitting for too long. Riding a bike for a long time. Heavy lifting or other things that take a lot of effort. Anal sex. What are the signs or symptoms? Pain. Itching or soreness in the butt. Bleeding from the butt. Leaking poop. Swelling. One or more lumps around the opening of your butt. How is this treated? In most cases,  hemorrhoids can be treated at home. You may be told to: Change what you eat. Make changes to your lifestyle. If these treatments do not help, you may need to have a procedure done. Your doctor may need to: Place rubber bands at the bottom of the hemorrhoids to make them fall off. Put medicine into the hemorrhoids to shrink them. Shine a type of light on the hemorrhoids to cause them to fall off. Do surgery to get rid of the hemorrhoids. Follow these instructions at home: Medicines Take over-the-counter and prescription medicines only as told by your doctor. Use creams with medicine in them or medicines that you put in your butt as told by your doctor. Eating and drinking  Eat foods that have a lot of fiber in them. These include whole grains, beans, nuts, fruits, and vegetables. Ask your doctor about taking products that have fiber added to them (fibersupplements). Take in less fat. You can do this by: Eating low-fat dairy products. Eating less red meat. Staying away from processed foods. Drink enough fluid to keep your pee (urine) pale yellow. Managing pain and swelling  Take a warm-water bath (sitz bath) for 20 minutes to ease pain. Do this 3-4 times a day. You may do this in a bathtub. You may also use a portable sitz bath that fits over the toilet. If told, put ice on the painful area. It may help to use ice between your warm baths. Put ice in a plastic bag. Place a towel between your skin and the bag. Leave the ice on for 20 minutes, 2-3 times a day. If your skin turns bright red, take off the ice right away to prevent skin damage. The risk of damage is higher if you cannot feel pain, heat, or cold. General instructions Exercise. Ask your doctor how much and what kind of exercise is best for you. Go to the bathroom when you need to poop. Do not wait. Try not to push too hard when you poop. Keep your butt dry and clean. Use wet toilet paper or moist towelettes after you poop. Do  not sit on the toilet for a long time. Contact a doctor if: You have pain and swelling that do not get better with treatment. You have trouble pooping. You cannot poop. You have pain or swelling outside the area of the hemorrhoids. Get help right away if: You have bleeding from the butt that will not stop. This information is not intended to replace advice given to you by your health care provider. Make sure you discuss any questions you have with your health care provider. Document Revised: 06/28/2022 Document Reviewed: 06/28/2022 Elsevier Patient Education  2024 Elsevier Inc.  Diverticulosis  Diverticulosis is when small pouches called diverticula form in the wall of the colon. The colon is where water is absorbed. It is also where poop (stool) is formed. The pouches form when the inside layer of the colon pushes through weak spots in the outer layers of the colon. You may have a few  pouches or many of them. In most cases, the pouches do not cause problems. If they become inflamed or infected, you may have a condition called diverticulitis. What are the causes? The cause of this condition is not known. What increases the risk? You are more likely to get this condition if: You are older than 74 years of age. You do not eat enough fiber or you get constipated a lot. You are overweight. You do not get enough exercise. You smoke. You take over-the-counter pain medicines. You have a family history of the condition. What are the signs or symptoms? In most people, there are no symptoms. If you do have symptoms, they may include: Bloating. Stomach cramps. Constipation or diarrhea. Pain in the lower left side of your abdomen. How is this diagnosed? This condition is often diagnosed during an exam for other colon problems. It may be diagnosed when you have: A colonoscopy. This is when a tube with a camera on the end is used to look at your colon. A barium enema. This is an X-ray exam  that uses dye to look at your colon. A CT scan. How is this treated? You may not need treatment. Your health care provider will tell you what you can do at home to help prevent problems. You may need treatment if you have symptoms or if you have had diverticulitis before. You may be told to: Eat a high-fiber diet. Take medicine to relax your colon. Lose weight. Follow these instructions at home: Medicines Take over-the-counter and prescription medicines only as told by your provider. If told, take a fiber supplement or probiotic. Managing constipation Your condition may cause constipation. To prevent or treat constipation, you may need to: Drink enough fluid to keep your pee (urine) pale yellow. Take over-the-counter or prescription medicines. Eat foods that are high in fiber, such as beans, whole grains, and fresh fruits and vegetables. Limit foods that are high in fat and processed sugars, such as fried or sweet foods. Try not to strain when you poop. Contact a health care provider if: Your symptoms get worse all of a sudden. You have pain in your abdomen that gets worse. You have bloating or stomach cramps. You continue to have frequent constipation. You have a fever or chills. You vomit. Your poop is bloody, black, or tarry. This information is not intended to replace advice given to you by your health care provider. Make sure you discuss any questions you have with your health care provider. Document Revised: 07/13/2022 Document Reviewed: 07/13/2022 Elsevier Patient Education  2024 [employer].

## 2024-01-29 NOTE — Progress Notes (Signed)
 Called to room to assist during endoscopic procedure.  Patient ID and intended procedure confirmed with present staff. Received instructions for my participation in the procedure from the performing physician.

## 2024-01-29 NOTE — Telephone Encounter (Signed)
 Patient called and stated that she had a procedure done to day and that Dr. Leonides Schanz had prescribed her Mesalamine. Patient spoke to her pharmacy and they don't have anything for her. Patient stated that her medication [facility] [place]. Please advise.

## 2024-01-30 ENCOUNTER — Telehealth: Payer: Self-pay | Admitting: *Deleted

## 2024-01-30 NOTE — Telephone Encounter (Signed)
  Follow up Call-     01/29/2024   10:50 AM 05/08/2023    8:19 AM 06/16/2021   10:19 AM  Call back number  Post procedure Call Back phone  # [phone]  Permission to leave phone message Yes Yes Yes     Patient questions:  Do you have a fever, pain , or abdominal swelling? No. Pain Score  0 *  Have you tolerated food without any problems? Yes.    Have you been able to return to your normal activities? Yes.    Do you have any questions about your discharge instructions: Diet   No. Medications  Yes.   Follow up visit  No.  Do you have questions or concerns about your Care? No.  Actions: * If pain score is 4 or above: No action needed, pain <4.   Pt stated that she was prescribed Mesalamine and the pharmacy does not have the prescription.  Pt called the office yesterday, 4/1.  Telephone conversation documented and waiting on reply from Dr. Leonides Schanz.

## 2024-01-31 ENCOUNTER — Other Ambulatory Visit: Payer: Self-pay

## 2024-01-31 ENCOUNTER — Encounter: Payer: Self-pay | Admitting: Internal Medicine

## 2024-01-31 ENCOUNTER — Telehealth: Payer: Self-pay | Admitting: Internal Medicine

## 2024-01-31 LAB — SURGICAL PATHOLOGY

## 2024-01-31 MED ORDER — MESALAMINE 4 G RE ENEM: 4.0000 g | ENEMA | Freq: Every day | RECTAL | 0 refills | Status: DC

## 2024-01-31 NOTE — Telephone Encounter (Signed)
 This has been resolved in a separate phone note (01/31/24).

## 2024-01-31 NOTE — Telephone Encounter (Signed)
 Spoke with patient & advised her the order had been corrected, and [facility]. Advised her to call back with any further issues.

## 2024-01-31 NOTE — Telephone Encounter (Signed)
 [facility]bound call [facility] [place] stating they have not received prescription for ROWASA. Requesting a call at [phone]. Please advise, thank you.

## 2024-02-01 ENCOUNTER — Other Ambulatory Visit: Payer: Self-pay

## 2024-02-01 ENCOUNTER — Telehealth: Payer: Self-pay | Admitting: Internal Medicine

## 2024-02-01 MED ORDER — HYDROCORTISONE ACETATE 25 MG RE SUPP: 25.0000 mg | Freq: Two times a day (BID) | RECTAL | 0 refills | Status: DC

## 2024-02-01 NOTE — Telephone Encounter (Signed)
 Flex sig on 4/1 showed - Diffuse inflammation was found in the rectum, in the sigmoid colon and in the descending colon. Biopsied. - Diverticulosis in the sigmoid colon. - Non- bleeding internal hemorrhoids. Pathology has not been read yet by Dr. Leonides Schanz.  Please continue Rowasa enemas can also send in hydrocortisone suppositories.  If patient is having left lower quadrant discomfort as long as well as feeling dizzy/lightheaded may need to go to urgent care for stat CBC or evaluation as this is a Friday at 4. Can try and get a stat CBC here but would need to alert on-call physician and most likely would be better taking care of at an urgent care or the ER.

## 2024-02-01 NOTE — Telephone Encounter (Signed)
 Inbound call from patient stating she has been very weak and still having rectal bleeding. States it is only blood and no diarrhea. Patient is requesting a urgent call back. Please advise, thank you.

## 2024-02-01 NOTE — Telephone Encounter (Signed)
 Patient had flex sig with Dr. Leonides Schanz on 01/29/24 & today she has been experiencing bright red rectal bleeding w/o stool each time she goes to void. She says it looks like the blood is more solid, has "small particles." Small BM earlier this AM. LLQ discomfort occasionally. Feels drained/lightheaded at times. She's been using OTC cream for ext hemorrhoids & plans to pick up Rowasa enemas this evening (pharmacy has been out of stock up until today). Pt is on Eliquis BID. Pt would like for MD to be aware & advise on any further recommendations in the meantime.

## 2024-02-01 NOTE — Telephone Encounter (Signed)
 Pt made aware of PA recommendations & verbalized all understanding. Suppositories sent to pharmacy.

## 2024-02-04 ENCOUNTER — Telehealth: Payer: Self-pay | Admitting: Internal Medicine

## 2024-02-04 NOTE — Telephone Encounter (Signed)
 Inbound call from patient requesting a call from Dr. Leonides Schanz. States she has received results from flexible sigmoidoscopy and would like to discuss questions she has. Please advise, thank you.

## 2024-02-04 NOTE — Telephone Encounter (Signed)
 Spoke to Nicole Daniels about the results of her pathology, which showed that she has left-sided inflammation in her colon as a result of ulcerative colitis.  I went over the ulcerative colitis diagnosis more in detail with the patient.  Patient has had a lot of difficulty getting her Rowasa enemas because apparently it was sent into her pharmacy incorrectly at first and then her pharmacy did not have any stock until now.  She plans to take her Rowasa enemas starting today.  Patient was concerned about some nausea and rectal bleeding after her procedure.  I did tell the patient that she would likely have some increased rectal bleeding after her flexible sigmoidoscopy due to the colon biopsies that I took.  I had to leave her on blood thinners due to her recent PE/DVT, which is likely exacerbating her rectal bleeding.  I did offer the patient a chance to drop off a CBC to make sure that her blood counts looked okay.  Her last hemoglobin was completely normal.  Patient would like to hold off for now and would like to see if the Rowasa enemas help with her rectal bleeding.  I also offered the patient some antinausea medication such as Zofran, but she stated that her nausea was not severe enough to require medications.  Patient inquired about the idea of an upper endoscopy, I said this could be potentially considered in the future.  Patient will update [place] after about a week to see how she is doing on the Rowasa enemas.

## 2024-02-04 NOTE — Telephone Encounter (Signed)
 Pt states that she would like a call from Dr. Leonides Schanz to discuss her recent pathology results and treatment and what to expect. Pt did state that she received the path letter that was sent. Pt stated that she feels that she has had an increase in nausea and fatigue since the procedure.  Pt requesting call back from Dr. Leonides Schanz

## 2024-02-07 ENCOUNTER — Ambulatory Visit: Payer: Self-pay

## 2024-02-07 ENCOUNTER — Emergency Department (HOSPITAL_BASED_OUTPATIENT_CLINIC_OR_DEPARTMENT_OTHER)

## 2024-02-07 ENCOUNTER — Telehealth: Payer: Self-pay | Admitting: Internal Medicine

## 2024-02-07 ENCOUNTER — Other Ambulatory Visit: Payer: Self-pay

## 2024-02-07 ENCOUNTER — Encounter (HOSPITAL_BASED_OUTPATIENT_CLINIC_OR_DEPARTMENT_OTHER): Payer: Self-pay | Admitting: Emergency Medicine

## 2024-02-07 ENCOUNTER — Emergency Department (HOSPITAL_BASED_OUTPATIENT_CLINIC_OR_DEPARTMENT_OTHER)
Admission: EM | Admit: 2024-02-07 | Discharge: 2024-02-07 | Disposition: A | Discharge status: Home / Self Care | Attending: Emergency Medicine | Admitting: Emergency Medicine

## 2024-02-07 DIAGNOSIS — K5792 Diverticulitis of intestine, part unspecified, without perforation or abscess without bleeding: Secondary | ICD-10-CM

## 2024-02-07 DIAGNOSIS — Z7901 Long term (current) use of anticoagulants: Secondary | ICD-10-CM | POA: Insufficient documentation

## 2024-02-07 DIAGNOSIS — K5732 Diverticulitis of large intestine without perforation or abscess without bleeding: Secondary | ICD-10-CM | POA: Insufficient documentation

## 2024-02-07 DIAGNOSIS — R1032 Left lower quadrant pain: Secondary | ICD-10-CM | POA: Diagnosis not present

## 2024-02-07 LAB — COMPREHENSIVE METABOLIC PANEL WITH GFR
ALT: 10 U/L (ref 0–44)
AST: 15 U/L (ref 15–41)
Albumin: 3.5 g/dL (ref 3.5–5.0)
Alkaline Phosphatase: 71 U/L (ref 38–126)
Anion gap: 11 (ref 5–15)
BUN: 11 mg/dL (ref 8–23)
CO2: 28 mmol/L (ref 22–32)
Calcium: 9 mg/dL (ref 8.9–10.3)
Chloride: 103 mmol/L (ref 98–111)
Creatinine, Ser: 0.87 mg/dL (ref 0.44–1.00)
GFR, Estimated: >60 mL/min (ref >60)
Glucose, Bld: 107 mg/dL — ABNORMAL HIGH (ref 70–99)
Potassium: 3 mmol/L — ABNORMAL LOW (ref 3.5–5.1)
Sodium: 142 mmol/L (ref 135–145)
Total Bilirubin: 0.4 mg/dL (ref 0.0–1.2)
Total Protein: 6.7 g/dL (ref 6.5–8.1)

## 2024-02-07 LAB — CBC WITH DIFFERENTIAL/PLATELET
Abs Immature Granulocytes: 0.03 10*3/uL (ref 0.00–0.07)
Basophils Absolute: 0 10*3/uL (ref 0.0–0.1)
Basophils Relative: 0 %
Eosinophils Absolute: 0.2 10*3/uL (ref 0.0–0.5)
Eosinophils Relative: 3 %
HCT: 39.3 % (ref 36.0–46.0)
Hemoglobin: 13 g/dL (ref 12.0–15.0)
Immature Granulocytes: 0 %
Lymphocytes Relative: 29 %
Lymphs Abs: 2.1 10*3/uL (ref 0.7–4.0)
MCH: 29.9 pg (ref 26.0–34.0)
MCHC: 33.1 g/dL (ref 30.0–36.0)
MCV: 90.3 fL (ref 80.0–100.0)
Monocytes Absolute: 0.6 10*3/uL (ref 0.1–1.0)
Monocytes Relative: 9 %
Neutro Abs: 4.1 10*3/uL (ref 1.7–7.7)
Neutrophils Relative %: 59 %
Platelets: 408 10*3/uL — ABNORMAL HIGH (ref 150–400)
RBC: 4.35 MIL/uL (ref 3.87–5.11)
RDW: 13.7 % (ref 11.5–15.5)
WBC: 7.1 10*3/uL (ref 4.0–10.5)
nRBC: 0 % (ref 0.0–0.2)

## 2024-02-07 LAB — RESP PANEL BY RT-PCR (RSV, FLU A&B, COVID)  RVPGX2
Influenza A by PCR: NEGATIVE
Influenza B by PCR: NEGATIVE
Resp Syncytial Virus by PCR: NEGATIVE
SARS Coronavirus 2 by RT PCR: NEGATIVE

## 2024-02-07 LAB — LIPASE, BLOOD: Lipase: 28 U/L (ref 11–51)

## 2024-02-07 LAB — MAGNESIUM: Magnesium: 2.1 mg/dL (ref 1.7–2.4)

## 2024-02-07 MED ORDER — SODIUM CHLORIDE 0.9 % IV BOLUS
1000.0000 mL | Freq: Once | INTRAVENOUS | Status: AC
  Administered 2024-02-07: 1000 mL via INTRAVENOUS

## 2024-02-07 MED ORDER — IOHEXOL 300 MG/ML  SOLN
100.0000 mL | Freq: Once | INTRAMUSCULAR | Status: AC | PRN
  Administered 2024-02-07: 85 mL via INTRAVENOUS

## 2024-02-07 MED ORDER — FENTANYL CITRATE PF 50 MCG/ML IJ SOSY
12.5000 ug | PREFILLED_SYRINGE | Freq: Once | INTRAMUSCULAR | Status: AC
  Administered 2024-02-07: 12.5 ug via INTRAVENOUS
  Filled 2024-02-07: qty 1

## 2024-02-07 MED ORDER — AMOXICILLIN-POT CLAVULANATE 875-125 MG PO TABS: 1.0000 | ORAL_TABLET | Freq: Two times a day (BID) | ORAL | 0 refills | Status: AC

## 2024-02-07 MED ORDER — AMOXICILLIN-POT CLAVULANATE 875-125 MG PO TABS
1.0000 | ORAL_TABLET | Freq: Once | ORAL | Status: AC
  Administered 2024-02-07: 1 via ORAL
  Filled 2024-02-07: qty 1

## 2024-02-07 MED ORDER — ONDANSETRON HCL 4 MG/2ML IJ SOLN
4.0000 mg | Freq: Once | INTRAMUSCULAR | Status: AC
  Administered 2024-02-07: 4 mg via INTRAVENOUS
  Filled 2024-02-07: qty 2

## 2024-02-07 NOTE — ED Notes (Signed)
 Pt aware of need for stool sample. Instructed to press call when able to provide a sample.

## 2024-02-07 NOTE — ED Notes (Signed)
 Pt reports not being able to provide stool sample after two attempts. MD notified.

## 2024-02-07 NOTE — Telephone Encounter (Signed)
 Mary RN with [facility] asked me to speak with pt; disposition was ED but pt does not want to go to ED unless necessary. For few weeks pt has not felt well but now more fatigued, weak, body aches, lightheaded and blurred vision comes and goes. Pt has H/A over rt eye and in back of head that is a dull pain that comes and goes. Pt has no appetite and pt is trying to drink; now BP 134/92 P 83. After speaking with pt and her recent hx pt is going to [facility] ED by car now and a friend is driving her. Pt will cb with update;l sending note to Hayden Pedro FNP who is out of office now.,

## 2024-02-07 NOTE — ED Triage Notes (Signed)
 Body aches, headaches nausea  weak and fatigue since Tuesday T 99.2 at home

## 2024-02-07 NOTE — ED Notes (Signed)
Reviewed discharge instructions, medications, and home care with pt. Pt verbalized understanding and had no further questions. Pt exited ED without complications.

## 2024-02-07 NOTE — Discharge Instructions (Addendum)
 In the setting of your new diagnosis of ulcerative colitis, your CT scan today demonstrates a mild acute uncomplicated diverticulitis of the sigmoid colon. No perforation, fluid collection, or abscess.  This is an inflammatory and infectious state of the small outpouchings of the colon at the diverticula.  It is referred to diverticulosis when it is not inflamed or infected and diverticulitis when infection and/or inflammation develops.  Please call your GI specialist if symptoms do not begin to improve in the next 48 hours after initiating antibiotics.

## 2024-02-07 NOTE — ED Notes (Signed)
 Patient transported to CT

## 2024-02-07 NOTE — Telephone Encounter (Signed)
 Pt made aware of MD recommendations. She declined zofran. She will call back if symptoms persist or has any other questions/concerns.

## 2024-02-07 NOTE — Telephone Encounter (Signed)
 Inbound call from patient stating that Monday night she took mesalamine medication. States she started feeling body ached, abdominal cramping, nausea, and weakness on Tuesday. States she is unsure if medication is causing symptoms. Requesting a call back. Please advise, thank you.

## 2024-02-07 NOTE — Telephone Encounter (Signed)
 Copied from CRM [id number]. Topic: Clinical - Red Word Triage >> Feb 07, 2024  4:14 PM Florestine Avers wrote: Red Word that prompted transfer to Nurse Triage: Patient is having flu like symptoms which include: body ache, feeling fatigued and is requesting a flu test and a next day appointment.   Chief Complaint: Fatigue, Weakness, Malaise, Body Aches Symptoms: Fatigue, Generalized Malaise,  Frequency: 3 days Pertinent Negatives: Patient denies chest pain, cough, difficulty breathing,  Disposition: [x] ED /[] Urgent Care (no appt availability in office) / [] Appointment(In office/virtual)/ []  [facility]/ [] Home Care/ [] Refused Recommended Disposition /[] [facility]/ []  Follow-up with PCP Additional Notes: Patient called and advised that 3 days ago she started having body aches, nausea, headache, dizziness, fatigue, and generalized malaise. Patient states that she has been through a lot in the past few months. Patient states that she started having symptoms the next day after starting enemas that she was doing. Patient had a procedure last week with G.I. Patient spoke with her G.I. doctor who didn't think it was related to the enemas involving their care. Patient has ulcerative colitis and she has been put on enemas by her G.I. Rowasa enema is what patient is doing---started this Monday night. Patient denies any bleeding at this time.  She doesn't know if the enema is causing side effects or what is going on. Patient states that she has been staying hydrated. Patient states that she has had issues with her Potassium being low in the past. She also states that she had a bad experience with an Urgent Care. Patient states that she does not want to go to the hospital if she absolutely doesn't have to. She states that she had been 5 times dealing with the blood clots in her leg and lungs.  This was more of a reason this RN felt like she needed to go to the Emergency Room at this time to be  evaluated further by a provider. This RN called the [facility] to discuss this patient's situation because at this time, the patient's recent medical history is concerning in correlation with the symptoms she is describing today. Nurse at the clinic agreed to speak with the patient at this time and this RN connected the patient with the Nurse from the clinic.    Reason for Disposition  Patient sounds very sick or weak to the triager    Recent history of a blood clot in the leg and blood clots in her lungs were concerning for her symptoms today.  Answer Assessment - Initial Assessment Questions 1. DESCRIPTION: "Describe how you are feeling."     For the past 3 days, body aches and fatigued--having to nap after lunch 2. SEVERITY: "How bad is it?"  "Can you stand and walk?"   - MILD (0-3): Feels weak or tired, but does not interfere with work, school or normal activities.   - MODERATE (4-7): Able to stand and walk; weakness interferes with work, school, or normal activities.   - SEVERE (8-10): Unable to stand or walk; unable to do usual activities.     Denies any trouble walking 3. ONSET: "When did these symptoms begin?" (e.g., hours, days, weeks, months)     3 days ago 4. CAUSE: "What do you think is causing the weakness or fatigue?" (e.g., not drinking enough fluids, medical problem, trouble sleeping)     Unsure 5. NEW MEDICINES:  "Have you started on any new medicines recently?" (e.g., opioid pain medicines, benzodiazepines, muscle relaxants, antidepressants, antihistamines,  neuroleptics, beta blockers)     Enemas and about a month ago she started Eloquis because she had a blood clot in her left leg and one broke off and went to both lungs---all about a month ago. 6. OTHER SYMPTOMS: "Do you have any other symptoms?" (e.g., chest pain, fever, cough, SOB, vomiting, diarrhea, bleeding, other areas of pain)     Nausea, Body aches, headaches, dizziness,  Answer Assessment - Initial Assessment  Questions 1. DESCRIPTION: "Describe your dizziness."     When she gets up to walk she feels lightheaded and drained/weak 2. LIGHTHEADED: "Do you feel lightheaded?" (e.g., somewhat faint, woozy, weak upon standing)     Woozy/weak when standing 3. VERTIGO: "Do you feel like either you or the room is spinning or tilting?" (i.e. vertigo)     No 4. SEVERITY: "How bad is it?"  "Do you feel like you are going to faint?" "Can you stand and walk?"   - MILD: Feels slightly dizzy, but walking normally.   - MODERATE: Feels unsteady when walking, but not falling; interferes with normal activities (e.g., school, work).   - SEVERE: Unable to walk without falling, or requires assistance to walk without falling; feels like passing out now.      mild 5. ONSET:  "When did the dizziness begin?"     3 days ago 6. AGGRAVATING FACTORS: "Does anything make it worse?" (e.g., standing, change in head position)     Standing 7. HEART RATE: "Can you tell me your heart rate?" "How many beats in 15 seconds?"  (Note: not all patients can do this)       N/a 8. CAUSE: "What do you think is causing the dizziness?"     Unsure 9. RECURRENT SYMPTOM: "Have you had dizziness before?" If Yes, ask: "When was the last time?" "What happened that time?"     Vertigo in the past but this feels different 10. OTHER SYMPTOMS: "Do you have any other symptoms?" (e.g., fever, chest pain, vomiting, diarrhea, bleeding)       Malaise, fatigue, body aches  Protocols used: Weakness (Generalized) and Fatigue-A-AH, Dizziness - Lightheadedness-A-AH

## 2024-02-07 NOTE — Telephone Encounter (Signed)
 Patient started the Rowasa enema's on Monday & since then bleeding has resolved and diarrhea has improved, however she has developed body aches, fatigue, and nausea. Denies any fever. Tolerating diet okay, just a poor appetite. She hasn't been around anyone sick that she knows of. She would like to know if this should be expected & if it will resolve because she feels miserable.

## 2024-02-07 NOTE — ED Provider Notes (Signed)
 [place] EMERGENCY DEPARTMENT AT [facility] Provider Note   CSN: [id number] Arrival date & time: 02/07/24  1745     History  Chief Complaint  Patient presents with   Generalized Body Aches    Nicole Daniels is a 73 y.o. female.  Patient is a 73 year old female presenting for generalized feelings of unwellness.  She admits to myalgias, headaches, nausea, left lower quadrant abdominal pain, generalized weakness, emotional labile-ness, and a temperature of 99.2 orally.  Her history is pertinent for recent diagnosis of ulcerative colitis diagnosed by [facility] with Dr. Nicole Kindred or flexible sigmoidoscopy for rectal bleeding.  She was recently started on Rawasa enemas.  She admits to soft bowel movements frequently over the last 2 days including approximately 3 a day.  She admits to nausea without vomiting.  Admits to left lower quadrant mild abdominal pain without radiation.  The history is provided by the patient. No language interpreter was used.       Home Medications Prior to Admission medications   Medication Sig Start Date End Date Taking? Authorizing Provider  amoxicillin-clavulanate (AUGMENTIN) 875-125 MG tablet Take 1 tablet by mouth every 12 (twelve) hours for 7 days. 02/07/24 02/14/24 Yes Edwin Dada P, DO  hydrocortisone (ANUSOL-HC) 25 MG suppository Place 1 suppository (25 mg total) rectally 2 (two) times daily. 02/01/24   Doree Albee, PA-C  acetaminophen (TYLENOL) 325 MG tablet Take 650 mg by mouth every 6 (six) hours as needed.    [provider]  albuterol (VENTOLIN HFA) 108 (90 Base) MCG/ACT inhaler Inhale 2 puffs into the lungs every 6 (six) hours as needed for wheezing or shortness of breath. 12/12/23   Bedsole, Amy E, MD  apixaban (ELIQUIS) 5 MG TABS tablet Take 1 tablet (5 mg total) by mouth 2 (two) times daily. 01/23/24   Jeralyn Ruths, MD  chlorpheniramine (ALLERGY RELIEF) 4 MG tablet Take 1 tablet (4 mg total) by mouth 2 (two)  times daily as needed for allergies. 10/11/23   Mort Sawyers, FNP  Cholecalciferol (VITAMIN D3) 2000 units TABS Take 2,000 Units by mouth daily.    [provider]  Cyanocobalamin (VITAMIN B 12 PO) Take by mouth. Patient not taking: Reported on 01/29/2024    [provider]  ezetimibe (ZETIA) 10 MG tablet Take 1 tablet (10 mg total) by mouth daily. 11/07/23   Mort Sawyers, FNP  fluticasone (FLONASE) 50 MCG/ACT nasal spray Place 1 spray into both nostrils as needed for allergies.     [provider]  levothyroxine (SYNTHROID) 75 MCG tablet Take 1 tablet (75 mcg total) by mouth daily. 11/07/23   Mort Sawyers, FNP  meclizine (ANTIVERT) 12.5 MG tablet Take 1 tablet (12.5 mg total) by mouth 3 (three) times daily as needed for dizziness. 03/21/23   Mort Sawyers, FNP  mesalamine (ROWASA) 4 g enema Place 60 mLs (4 g total) rectally at bedtime. Lay on left side, retain as long as able 01/31/24   Imogene Burn, MD  OVER THE COUNTER MEDICATION Sinus and pain, take PRN.    [provider]  OVER THE COUNTER MEDICATION Pro-biotic one capsule daily.    [provider]  pantoprazole (PROTONIX) 20 MG tablet Take 1 tablet (20 mg total) by mouth daily. Patient not taking: Reported on 01/29/2024 01/14/24   Mort Sawyers, FNP  potassium chloride (KLOR-CON M) 10 MEQ tablet Take 1 tablet (10 mEq total) by mouth 2 (two) times daily for 5 days. Patient not taking: Reported on  01/29/2024 01/16/24 01/23/24  Mort Sawyers, FNP      Allergies    Simvastatin, Statins, Bactrim [sulfamethoxazole-trimethoprim], and Sertraline    Review of Systems   Review of Systems  Constitutional:  Negative for chills and fever.  HENT:  Negative for ear pain and sore throat.   Eyes:  Negative for pain and visual disturbance.  Respiratory:  Negative for cough and shortness of breath.   Cardiovascular:  Negative for chest pain and palpitations.  Gastrointestinal:  Positive for abdominal pain and  nausea. Negative for vomiting.       Soft frequent stools   Genitourinary:  Negative for dysuria and hematuria.  Musculoskeletal:  Negative for arthralgias and back pain.  Skin:  Negative for color change and rash.  Neurological:  Positive for weakness and headaches. Negative for seizures and syncope.  All other systems reviewed and are negative.   Physical Exam Updated Vital Signs BP (!) 140/64   Pulse 78   Temp 98.3 F (36.8 C) (Oral)   Resp 18   SpO2 97%  Physical Exam Vitals and nursing note reviewed.  Constitutional:      General: She is not in acute distress.    Appearance: She is well-developed.  HENT:     Head: Normocephalic and atraumatic.  Eyes:     Conjunctiva/sclera: Conjunctivae normal.  Cardiovascular:     Rate and Rhythm: Normal rate and regular rhythm.     Heart sounds: No murmur heard. Pulmonary:     Effort: Pulmonary effort is normal. No respiratory distress.     Breath sounds: Normal breath sounds.  Abdominal:     Palpations: Abdomen is soft.     Tenderness: There is abdominal tenderness in the left lower quadrant. There is no guarding or rebound.  Musculoskeletal:        General: No swelling.     Cervical back: Neck supple.  Skin:    General: Skin is warm and dry.     Capillary Refill: Capillary refill takes less than 2 seconds.  Neurological:     Mental Status: She is alert.  Psychiatric:        Mood and Affect: Mood normal.     ED Results / Procedures / Treatments   Labs (all labs ordered are listed, but only abnormal results are displayed) Labs Reviewed  COMPREHENSIVE METABOLIC PANEL WITH GFR - Abnormal; Notable for the following components:      Result Value   Potassium 3.0 (*)    Glucose, Bld 107 (*)    All other components within normal limits  CBC WITH DIFFERENTIAL/PLATELET - Abnormal; Notable for the following components:   Platelets 408 (*)    All other components within normal limits  RESP PANEL BY RT-PCR (RSV, FLU A&B, COVID)   [id number]  GASTROINTESTINAL PANEL BY PCR, STOOL (REPLACES STOOL CULTURE)  LIPASE, BLOOD  MAGNESIUM    EKG None  Radiology CT ABDOMEN PELVIS W CONTRAST Result Date: 02/07/2024 CLINICAL DATA:  Left lower quadrant abdominal pain, nausea, weakness and fatigue EXAM: CT ABDOMEN AND PELVIS WITH CONTRAST TECHNIQUE: Multidetector CT imaging of the abdomen and pelvis was performed using the standard protocol following bolus administration of intravenous contrast. RADIATION DOSE REDUCTION: This exam was performed according to the departmental dose-optimization program which includes automated exposure control, adjustment of the mA and/or kV according to patient size and/or use of iterative reconstruction technique. CONTRAST:  85mL OMNIPAQUE IOHEXOL 300 MG/ML  SOLN COMPARISON:  01/11/2024 FINDINGS: Lower chest: No acute pleural or parenchymal  lung disease. Hepatobiliary: No focal liver abnormality is seen. Status post cholecystectomy. No biliary dilatation. Pancreas: Unremarkable. No pancreatic ductal dilatation or surrounding inflammatory changes. Spleen: Normal in size without focal abnormality. Adrenals/Urinary Tract: Stable appearance of the bilateral kidneys. No urinary tract calculi or obstructive uropathy. The adrenals and bladder are unremarkable. Stomach/Bowel: No bowel obstruction or ileus. There is distal colonic diverticulosis, with mild wall thickening and pericolonic fat stranding involving the sigmoid colon consistent with acute uncomplicated diverticulitis. No perforation, fluid collection, or abscess. Vascular/Lymphatic: Aortic atherosclerosis. No enlarged abdominal or pelvic lymph nodes. Reproductive: Status post hysterectomy. No adnexal masses. Other: No free fluid or free intraperitoneal gas. No abdominal wall hernia. Musculoskeletal: No acute or destructive bony abnormalities. Reconstructed images demonstrate no additional findings. IMPRESSION: 1. Mild acute uncomplicated diverticulitis of the  sigmoid colon. No perforation, fluid collection, or abscess. 2.  Aortic Atherosclerosis (ICD10-I70.0). Electronically Signed   By: Sharlet Salina M.D.   On: 02/07/2024 22:25    Procedures Procedures    Medications Ordered in ED Medications  amoxicillin-clavulanate (AUGMENTIN) 875-125 MG per tablet 1 tablet (has no administration in time range)  sodium chloride 0.9 % bolus 1,000 mL (1,000 mLs Intravenous New Bag/Given 02/07/24 1858)  fentaNYL (SUBLIMAZE) injection 12.5 mcg (12.5 mcg Intravenous Given 02/07/24 1850)  ondansetron (ZOFRAN) injection 4 mg (4 mg Intravenous Given 02/07/24 1849)  iohexol (OMNIPAQUE) 300 MG/ML solution 100 mL (85 mLs Intravenous Contrast Given 02/07/24 1955)    ED Course/ Medical Decision Making/ A&P                                 Medical Decision Making Amount and/or Complexity of Data Reviewed Labs: ordered. Radiology: ordered.  Risk Prescription drug management.   43:74 PM 73 year old female with past medical history of rectal bleeding, flexible sigmoidoscopy, and diagnosis of ulcerative colitis recently started on  Rowasa enemas presenting for myalgias, headaches, nausea, left lower quadrant abdominal pain, generalized weakness, emotional labile-ness, and a temperature of 99.2 orally.  Patient is alert and oriented x 3, no acute distress, afebrile, stable vital signs.  Physical exam demonstrates soft abdomen with tenderness to palpation in the left lower quadrant.  No guarding or rigidity.  Studies demonstrate no leukocytosis.  No signs or symptoms of sepsis.  Stable liver profile, lipase, and renal function.  CT abdomen pelvis demonstrates acute uncomplicated diverticulitis.  Augmentin given in ED and sent to pharmacy.  Patient recommended for follow-up with GI specialist team if symptoms not improve in the next 48 hours.  Patient in no distress and overall condition improved here in the ED. Detailed discussions were had with the patient regarding current  findings, and need for close f/u with PCP or on call doctor. The patient has been instructed to return immediately if the symptoms worsen in any way for re-evaluation. Patient verbalized understanding and is in agreement with current care plan. All questions answered prior to discharge.         Final Clinical Impression(s) / ED Diagnoses Final diagnoses:  Diverticulitis    Rx / DC Orders ED Discharge Orders          Ordered    amoxicillin-clavulanate (AUGMENTIN) 875-125 MG tablet  Every 12 hours        02/07/24 2236              Franne Forts, DO 02/07/24 2244

## 2024-02-08 ENCOUNTER — Encounter: Payer: Self-pay | Admitting: Family

## 2024-02-08 ENCOUNTER — Other Ambulatory Visit: Payer: Self-pay | Admitting: Family

## 2024-02-08 DIAGNOSIS — Z7901 Long term (current) use of anticoagulants: Secondary | ICD-10-CM

## 2024-02-08 DIAGNOSIS — E876 Hypokalemia: Secondary | ICD-10-CM

## 2024-02-08 DIAGNOSIS — K921 Melena: Secondary | ICD-10-CM

## 2024-02-08 NOTE — Telephone Encounter (Signed)
 Inbound call from patient stating she was at the ED last night and was told she had diverticulitis. Requesting to know if she should continue enemas. Requesting a call back. Please advise, thank you.

## 2024-02-08 NOTE — Telephone Encounter (Signed)
 Pt made aware of MD recommendations. Scheduled OV for an opening on Monday 02/11/24 for ED f/u & to discuss further concerns.

## 2024-02-08 NOTE — Telephone Encounter (Signed)
 Agree with precautions given to pt  Agree with nurse assessment in plan.  Thank you for speaking with them.  I do see pt was seen in the ER.

## 2024-02-11 ENCOUNTER — Encounter: Payer: Self-pay | Admitting: Gastroenterology

## 2024-02-11 ENCOUNTER — Ambulatory Visit: Admitting: Gastroenterology

## 2024-02-11 VITALS — BP 108/70 | HR 80 | Ht 65.0 in | Wt 170.2 lb

## 2024-02-11 DIAGNOSIS — K625 Hemorrhage of anus and rectum: Secondary | ICD-10-CM

## 2024-02-11 DIAGNOSIS — K5732 Diverticulitis of large intestine without perforation or abscess without bleeding: Secondary | ICD-10-CM | POA: Diagnosis not present

## 2024-02-11 DIAGNOSIS — R109 Unspecified abdominal pain: Secondary | ICD-10-CM

## 2024-02-11 DIAGNOSIS — K51811 Other ulcerative colitis with rectal bleeding: Secondary | ICD-10-CM

## 2024-02-11 DIAGNOSIS — R194 Change in bowel habit: Secondary | ICD-10-CM

## 2024-02-11 DIAGNOSIS — R110 Nausea: Secondary | ICD-10-CM | POA: Diagnosis not present

## 2024-02-11 MED ORDER — POTASSIUM CHLORIDE CRYS ER 10 MEQ PO TBCR: 10.0000 meq | EXTENDED_RELEASE_TABLET | Freq: Two times a day (BID) | ORAL | 0 refills | Status: DC

## 2024-02-11 NOTE — Progress Notes (Signed)
 Chief Complaint:follow-up Primary GI Doctor:Dr. Leonides Schanz  HPI: 73 year old female history of hypothyroidism, BPPV, PFO, tubular adenomas,history of polyps and diverticulosis noted on colonoscopy last year, DVT/PE LLE currently on apixaban for less than a month (started 12/24/23 after ED diagnosis)  presents for evaluation of rectal bleeding.   Patient states in August she had a sinus infection and was given a 14-day course of Augmentin.  Midway through the course of Augmentin she developed diarrhea and associated with her diarrhea she noticed a "red jelly" substance that would come out in the stool and on the tissue paper.  She does have history of internal hemorrhoids and has had banding in the past.   She came in for lab work 07/19/2023 with normal CBC and CMP.  Since she was having LLQ pain CT abdomen pelvis with contrast 07/19/2023 was obtained and showed diverticulosis without acute diverticulitis.   She continues to have 3-4 stools per day.  Occasional fecal incontinence with urinating.  Stools vary from watery to "sand" to sometimes pebbles.  She has associated LLQ pain that is relieved with a bowel movement.  She typically will see this "red jelly substance" with each bowel movement. Reports some nausea, no vomiting.  Denies unintentional weight loss. 08/09/23 Stool study via diatherix is negative for infections including C diff    08/27/2023 patient last seen in GI office with Dr. Leonides Schanz for hemorrhoid banding. At that time patient was still having diarrhea with 3-4 bowel movements per day for the last couple of months after her colonoscopy.  She does have some rectal bleeding and rectal pain.  Anusol suppositories have not really helped.  Dr. Leonides Schanz recommended trial of Imodium 2 mg as needed to help with diarrhea. 01/11/2024 patient seen in ED for complaints of rectal bleeding. She has developed some abdominal pain, intermittent, waxing and waning, usually worse on the left side. Hemoccult  performed, but no stool in rectal vault. Patient has pictures of stool that show obvious bleeding, red blood. Hemoccult not sent due to poor sample. CT, no obvious infection. Stable renal cyst. I would recommend IVC filter eval. Can resume eliquis (at least) low dose from tomorrow.  Labs show: Potassium 3.2, BUN 10, creatinine 0.89, normal LFTs, WBC 6.2, hemoglobin 14, platelets 419 01/19/2024 patient seen in ED for left leg pain.  Patient did states she fell off a curb a couple days ago.Bilateral LE [place], which revealed DVT in L tibial and perioneal veins which is improved from prior US, no new clot burden visualized.  01/23/2024 patient seen by oncology to evaluate for elevated factor VIII level in the setting of acute blood clot and anticoagulation.  Recommend patient stay on Eliquis 5 mg twice daily per day for total of 6 months completing treatment at the end of August.  01/28/24 patient seen by myself in the office with main complaint of rectal bleeding and abdominal pain.  Upon examination it was noted she had liquid brown mixed with red stool with mucus.  Ordered flexible sigmoidoscopy. Flex sig on 4/1 showed - Diffuse inflammation was found in the rectum, in the sigmoid colon and in the descending colon. Biopsied.  - Diverticulosis in the sigmoid colon.  - Non- bleeding internal hemorrhoids. Path: showed that she has left-sided inflammation in her colon as a result of ulcerative colitis.  Patient started on Rowasa enemas. 02/07/24 ED visit for generalized feelings of unwellness, nausea, left lower quadrant pain. CT abdomen pelvis demonstrates acute uncomplicated diverticulitis. Augmentin for 7 days given in  ED and sent to pharmacy.   Interval History    Patient recently diagnosed with Ulcerative colitis on recent flexible sigmoidoscopy.  Patient started Rowasa enemas last Monday. She states last Tues-Thursday no rectal bleeding. She had large stool on Thursday afternoon which causes a little bit of  bleeding. No BM Friday. Saturday she had some BRB on toilet paper with wiping. She has had what she calls small baby blobs of stool.      She recently was diagnosed with uncomplicated diverticulitis and on day 4 of the antibiotics. She has intermittent abdominal cramping in LLQ and above umbilicus. The pain above umbilicus started few days ago. She has a lot of nausea but does not want to take medication for it.   Her potassium was low at Ed, so her PCP is supplementing her with oral potassium for the next 10 days and rechecking lab work next week.  Wt Readings from Last 3 Encounters:  02/11/24 170 lb 4 oz (77.2 kg)  01/29/24 170 lb (77.1 kg)  01/28/24 17 lb (7.711 kg)    Past Medical History:  Diagnosis Date   Abdominal pain, right lower quadrant    Acquired cyst of kidney 01/08/2012   pt denies history of    Acute sinusitis, unspecified    Adenomatous colon polyp    Allergy    Angiomyolipoma    kidney   Anxiety    OCCASIONAL   Cataract    Chest pain    Chest pain, unspecified    Depressive disorder, not elsewhere classified 01/08/2012   not currently    Diverticulosis    Dizziness and giddiness    DVT (deep venous thrombosis) (HCC)    Dysfunction of eustachian tube    H/O Spinal surgery 02/20/2017   Hemorrhoids    History of diverticulitis of colon    History of fusion of cervical spine 04/25/2018   IBS (irritable bowel syndrome)    Liver lesion 2011   Neoplasm of skin of face    malignant   OA (osteoarthritis)    knee, NECK   Obesity, unspecified    Ostium secundum type atrial septal defect    Other and unspecified hyperlipidemia    Other malaise and fatigue    Palpitations    Pulmonary embolism (HCC)    Shortness of breath    Thyroid disease    Unspecified essential hypertension    pt states she does not have HTN   Unspecified sinusitis (chronic)    Unspecified vitamin D deficiency     Past Surgical History:  Procedure Laterality Date   ABDOMINAL  HYSTERECTOMY     BREAST REDUCTION SURGERY  1990   CERVICAL DISC SURGERY     Dr.Nudleman   CHOLECYSTECTOMY     COLONOSCOPY     DILATION AND CURETTAGE OF UTERUS     EYE SURGERY Bilateral 07/30/2017   cataract sx   FLEXIBLE SIGMOIDOSCOPY  01/08/2012   Procedure: FLEXIBLE SIGMOIDOSCOPY;  Surgeon: Hart Carwin, MD;  Location: [facility];  Service: Endoscopy;  Laterality: N/A;   HEMORRHOID SURGERY     KNEE ARTHROTOMY     pt states no    LAMINECTOMY N/A 01/31/2017   Procedure: Thoracic Laminectomy and marsupialization of arachnoid cyst;  Surgeon: Shirlean Kelly, MD;  Location: [facility];  Service: Neurosurgery;  Laterality: N/A;   LEFT OOPHORECTOMY  1976   NECK SURGERY N/A 03/2018   PARTIAL HYSTERECTOMY     right ovary remains   REDUCTION MAMMAPLASTY  TONSILLECTOMY  1972   UPPER GASTROINTESTINAL ENDOSCOPY      Current Outpatient Medications  Medication Sig Dispense Refill   acetaminophen (TYLENOL) 325 MG tablet Take 650 mg by mouth every 6 (six) hours as needed.     albuterol (VENTOLIN HFA) 108 (90 Base) MCG/ACT inhaler Inhale 2 puffs into the lungs every 6 (six) hours as needed for wheezing or shortness of breath. 8 g 2   amoxicillin-clavulanate (AUGMENTIN) 875-125 MG tablet Take 1 tablet by mouth every 12 (twelve) hours for 7 days. 14 tablet 0   apixaban (ELIQUIS) 5 MG TABS tablet Take 1 tablet (5 mg total) by mouth 2 (two) times daily. 60 tablet 6   chlorpheniramine (ALLERGY RELIEF) 4 MG tablet Take 1 tablet (4 mg total) by mouth 2 (two) times daily as needed for allergies.     Cholecalciferol (VITAMIN D3) 2000 units TABS Take 2,000 Units by mouth daily.     Cyanocobalamin (VITAMIN B 12 PO) Take by mouth.     ezetimibe (ZETIA) 10 MG tablet Take 1 tablet (10 mg total) by mouth daily. 90 tablet 3   fluticasone (FLONASE) 50 MCG/ACT nasal spray Place 1 spray into both nostrils as needed for allergies.      hydrocortisone (ANUSOL-HC) 25 MG suppository Place 1 suppository (25 mg total)  rectally 2 (two) times daily. 12 suppository 0   levothyroxine (SYNTHROID) 75 MCG tablet Take 1 tablet (75 mcg total) by mouth daily. 90 tablet 3   meclizine (ANTIVERT) 12.5 MG tablet Take 1 tablet (12.5 mg total) by mouth 3 (three) times daily as needed for dizziness. 30 tablet 0   mesalamine (ROWASA) 4 g enema Place 60 mLs (4 g total) rectally at bedtime. Lay on left side, retain as long as able 1800 mL 0   OVER THE COUNTER MEDICATION Sinus and pain, take PRN.     OVER THE COUNTER MEDICATION Pro-biotic one capsule daily.     potassium chloride (KLOR-CON M) 10 MEQ tablet Take 1 tablet (10 mEq total) by mouth 2 (two) times daily for 10 days. 20 tablet 0   pantoprazole (PROTONIX) 20 MG tablet TAKE 1 TABLET BY MOUTH EVERY DAY (Patient not taking: Reported on 02/11/2024) 90 tablet 1   No current facility-administered medications for this visit.    Allergies as of 02/11/2024 - Review Complete 02/11/2024  Allergen Reaction Noted   Simvastatin Other (See Comments) 01/27/2011   Statins Other (See Comments) 05/14/2017   Bactrim [sulfamethoxazole-trimethoprim] Itching 11/12/2013   Sertraline Anxiety 04/10/2019    Family History  Problem Relation Age of Onset   Hypertension Mother    Diabetes Mother    Cirrhosis Father    Heart disease Father    Hypertension Sister    Diabetes Sister    Heart disease Brother        x 2   Breast cancer Cousin 71 - 64   Diabetes Cousin    Diabetes Other        aunt   Stroke Other        aunt   Colon cancer Neg Hx    Esophageal cancer Neg Hx    Stomach cancer Neg Hx    Rectal cancer Neg Hx    Colon polyps Neg Hx     Review of Systems:    Constitutional: No weight loss, fever, chills, weakness or fatigue HEENT: Eyes: No change in vision               Ears, Nose, Throat:  No change in hearing or congestion Skin: No rash or itching Cardiovascular: No chest pain, chest pressure or palpitations   Respiratory: No SOB or cough Gastrointestinal: See HPI  and otherwise negative Genitourinary: No dysuria or change in urinary frequency Neurological: No headache, dizziness or syncope Musculoskeletal: No new muscle or joint pain Hematologic: No bleeding or bruising Psychiatric: No history of depression or anxiety    Physical Exam:  Vital signs: BP 108/70   Pulse 80   Ht 5\' 5"  (1.651 m)   Wt 170 lb 4 oz (77.2 kg)   SpO2 96%   BMI 28.33 kg/m   Constitutional:   Pleasant  female appears to be in NAD, Well developed, Well nourished, alert and cooperative Throat: Oral cavity and pharynx without inflammation, swelling or lesion.  Respiratory: Respirations even and unlabored. Lungs clear to auscultation bilaterally.   No wheezes, crackles, or rhonchi.  Cardiovascular: Normal S1, S2. Regular rate and rhythm. No peripheral edema, cyanosis or pallor.  Gastrointestinal:  Soft, nondistended, generalized tenderness No rebound or guarding. Normal bowel sounds. No appreciable masses or hepatomegaly. Rectal:  Not performed.  Msk:  Symmetrical without gross deformities. Without edema, no deformity or joint abnormality.  Neurologic:  Alert and  oriented x4;  grossly normal neurologically.  Skin:   Dry and intact without significant lesions or rashes. Psychiatric: Oriented to person, place and time. Demonstrates good judgement and reason without abnormal affect or behaviors.  RELEVANT LABS AND IMAGING: CBC    Latest Ref Rng & Units 02/07/2024    7:02 PM 01/28/2024    2:48 PM 01/19/2024    5:52 PM  CBC  WBC 4.0 - 10.5 K/uL 7.1  7.2  5.9   Hemoglobin 12.0 - 15.0 g/dL 16.1  09.6  04.5   Hematocrit 36.0 - 46.0 % 39.3  41.9  41.3   Platelets 150 - 400 K/uL 408  415.0  352      CMP     Latest Ref Rng & Units 02/07/2024    7:02 PM 01/28/2024    2:48 PM 01/19/2024    5:52 PM  CMP  Glucose 70 - 99 mg/dL 409  91  97   BUN 8 - 23 mg/dL 11  9  8    Creatinine 0.44 - 1.00 mg/dL 8.11  9.14  7.82   Sodium 135 - 145 mmol/L 142  142  142   Potassium 3.5 - 5.1  mmol/L 3.0  3.5  3.2   Chloride 98 - 111 mmol/L 103  106  110   CO2 22 - 32 mmol/L 28  28  26    Calcium 8.9 - 10.3 mg/dL 9.0  9.6  9.2   Total Protein 6.5 - 8.1 g/dL 6.7     Total Bilirubin 0.0 - 1.2 mg/dL 0.4     Alkaline Phos 38 - 126 U/L 71     AST 15 - 41 U/L 15     ALT 0 - 44 U/L 10        Lab Results  Component Value Date   TSH 1.30 10/11/2023  Previous GI workup:  01/11/24 CT Abd/pelvis IMPRESSION: 1. No acute inflammatory process identified within the abdomen or pelvis. 2. Multiple other nonacute observations, as described above. 02/07/24 CT abdomen pelvis with contrast IMPRESSION: 1. Mild acute uncomplicated diverticulitis of the sigmoid colon. No perforation, fluid collection, or abscess. 2.  Aortic Atherosclerosis (ICD10-I70.0).  Colonoscopy 05/08/2023 for personal history of colon polyps and positive fit test - The examined portion of  the ileum was normal.  - Four 3 to 6 mm polyps (tubular adenomas) in the ascending colon and in the cecum, removed with a cold snare. Resected and retrieved.  - Diverticulosis in the sigmoid colon. - One 3 mm polyp (polyploid benign mucosa) in the sigmoid colon, removed with a cold snare. Resected and retrieved. - Non- bleeding external and internal hemorrhoids.  Path: Diagnosis 1. Surgical [P], colon, ascending and cecum, polyp (4) - TUBULAR ADENOMA(S). - NO HIGH GRADE DYSPLASIA OR MALIGNANCY. - POLYPOID FRAGMENT OF BENIGN COLONIC MUCOSA WITH LYMPHOID AGGREGATE 2. Surgical [P], colon, sigmoid, polyp (1) - POLYPOID FRAGMENT OF BENIGN COLONIC MUCOSA WITH LYMPHOID AGGREGATE   Colonoscopy 06/16/2021 with Dr. Orvan Falconer for personal history of colon polyps - Hemorrhoids found on perianal exam.  - Non- bleeding internal hemorrhoids.  - Diverticulosis in the sigmoid colon and in the descending colon.  - One less than 1 mm polyp in the descending colon, removed with a cold biopsy forceps. Resected and retrieved.  - Three 2 to 3 mm polyps in the  ascending colon, removed with a cold snare. Resected and retrieved.  - Two 1 to 2 mm polyps in the cecum, removed with a cold snare. Resected and retrieved.  - The examination was otherwise normal on direct and retroflexion views. - repeat 3 years Diagnosis 1. Surgical [P], colon, cecum, polyp (2) - TUBULAR ADENOMA WITHOUT HIGH-GRADE DYSPLASIA OR MALIGNANCY - OTHER FRAGMENT OF POLYPOID COLONIC MUCOSA WITH PROMINENT LYMPHOID AGGREGATES 2. Surgical [P], colon, ascending, polyp (3) - TUBULAR ADENOMA(S) WITHOUT HIGH-GRADE DYSPLASIA OR MALIGNANCY 3. Surgical [P], colon, descending, polyp (1) - BENIGN LEIOMYOMA     Assessment: Encounter Diagnoses  Name Primary?   Nausea without vomiting Yes   Abdominal cramping    Altered bowel habits    Rectal bleeding    Other ulcerative colitis with rectal bleeding (HCC)    Diverticulitis of colon      73 year old female patient recently diagnosed with left sided ulcerative colitis and started on Rowasa enemas about 1 week ago.  Patient has seen improvement with rectal bleeding and discharge. Shortly after the colonoscopy patient developed body aches, fatigue, and nausea.  Patient went to ED and was diagnosed with acute uncomplicated diverticulitis and placed on 7 days of Augmentin.  Patient has completed about half of the antibiotic therapy.  Patient states the abdominal discomfort has improved some but still has intermittent cramping and nausea.  I suspect the periumbilical abdominal pain Nicole Daniels be related to being on antibiotics as it started about the same time. We discussed continuing the antibiotic therapy until complete and Rowasa suppositories.  Patient will follow a bland diet and push oral fluids.  She will update me in 1 week on how she is doing and we can decide if we want to proceed with current treatment plan or change/add medications. PT agrees to plan.  Plan: -Continue Rowasa enemas 4g at bedtime -Continue bland diet as tolerated, push plenty of  fluids. On oral potassium for repletion. - Declines ondansetron for nausea. She can use Ginger chews for nausea -She will message me in 1 week on how she is doing.  Thank you for the courtesy of this consult. Please call me with any questions or concerns.   Dayvon Dax, FNP-C [facility] 02/11/2024, 11:41 AM  Cc: Mort Sawyers, FNP

## 2024-02-11 NOTE — Patient Instructions (Addendum)
 Continue Rowasa enemas 4g at bedtime Continue bland diet as tolerated, push plenty of fluids You can use Ginger chews for nausea.  _______________________________________________________  If your blood pressure at your visit was 140/90 or greater, please contact your primary care physician to follow up on this.  _______________________________________________________  If you are age 74 or older, your body mass index should be between 23-30. Your Body mass index is 28.33 kg/m. If this is out of the aforementioned range listed, please consider follow up with your Primary Care Provider.  If you are age 20 or younger, your body mass index should be between 19-25. Your Body mass index is 28.33 kg/m. If this is out of the aformentioned range listed, please consider follow up with your Primary Care Provider.   ________________________________________________________  The [facility] providers would like to encourage you to use MYCHART to communicate with providers for non-urgent requests or questions.  Due to long hold times on the telephone, sending your provider a message by [facility] may be a faster and more efficient way to get a response.  Please allow 48 business hours for a response.  Please remember that this is for non-urgent requests.  _______________________________________________________

## 2024-02-12 NOTE — Progress Notes (Signed)
 I agree with the assessment and plan as outlined by Ms. May.

## 2024-02-14 ENCOUNTER — Ambulatory Visit: Admitting: Gastroenterology

## 2024-02-27 ENCOUNTER — Other Ambulatory Visit (INDEPENDENT_AMBULATORY_CARE_PROVIDER_SITE_OTHER)

## 2024-02-27 DIAGNOSIS — E876 Hypokalemia: Secondary | ICD-10-CM | POA: Diagnosis not present

## 2024-02-28 DIAGNOSIS — H52223 Regular astigmatism, bilateral: Secondary | ICD-10-CM | POA: Diagnosis not present

## 2024-02-28 DIAGNOSIS — H0288A Meibomian gland dysfunction right eye, upper and lower eyelids: Secondary | ICD-10-CM | POA: Diagnosis not present

## 2024-02-28 DIAGNOSIS — Z9841 Cataract extraction status, right eye: Secondary | ICD-10-CM | POA: Diagnosis not present

## 2024-02-28 DIAGNOSIS — H04123 Dry eye syndrome of bilateral lacrimal glands: Secondary | ICD-10-CM | POA: Diagnosis not present

## 2024-02-28 DIAGNOSIS — H0288B Meibomian gland dysfunction left eye, upper and lower eyelids: Secondary | ICD-10-CM | POA: Diagnosis not present

## 2024-02-28 DIAGNOSIS — Z9842 Cataract extraction status, left eye: Secondary | ICD-10-CM | POA: Diagnosis not present

## 2024-02-28 LAB — POTASSIUM: Potassium: 3.1 meq/L — ABNORMAL LOW (ref 3.5–5.1)

## 2024-02-28 MED ORDER — MESALAMINE 1.2 G PO TBEC: 4.8000 g | DELAYED_RELEASE_TABLET | Freq: Every day | ORAL | 2 refills | Status: DC

## 2024-03-03 ENCOUNTER — Encounter: Payer: Self-pay | Admitting: Family

## 2024-03-03 ENCOUNTER — Other Ambulatory Visit: Payer: Self-pay | Admitting: Family

## 2024-03-03 DIAGNOSIS — E876 Hypokalemia: Secondary | ICD-10-CM

## 2024-03-03 MED ORDER — POTASSIUM CHLORIDE CRYS ER 20 MEQ PO TBCR: 20.0000 meq | EXTENDED_RELEASE_TABLET | Freq: Two times a day (BID) | ORAL | 0 refills | Status: DC

## 2024-03-04 ENCOUNTER — Ambulatory Visit: Admitting: Urology

## 2024-03-04 NOTE — Telephone Encounter (Signed)
Can we get her in tomorrow?

## 2024-03-05 ENCOUNTER — Telehealth: Payer: Self-pay | Admitting: Gastroenterology

## 2024-03-05 DIAGNOSIS — R110 Nausea: Secondary | ICD-10-CM

## 2024-03-05 MED ORDER — ONDANSETRON 4 MG PO TBDP: 4.0000 mg | ORAL_TABLET | Freq: Three times a day (TID) | ORAL | 0 refills | Status: DC | PRN

## 2024-03-05 NOTE — Telephone Encounter (Signed)
 Message had to have been cleared out of the [place] [facility] because did not receive this message. Spoke with pt and she has been scheduled to see Tabitha on 03/06/24 at 0940. I offered to schedule the pt with another provider to be seen today, she declined.

## 2024-03-05 NOTE — Telephone Encounter (Signed)
 Spoke with patient. She states she started oral mesalamine  about a week ago and started having worsening diarrhea, fever, headache, poor appetite and nausea. She states she is trying to intake fluids. Dr. Rosaline Coma is out of office will discuss case with Dr. Yvone Herd. Will have patient stop the mesalamine  for now.

## 2024-03-05 NOTE — Telephone Encounter (Signed)
 Pt was contacted in regard to previous note.  Pt stated that she recently spoke to [facility] May NP today and that Deanna was going to consult with another provider and respond back to her.

## 2024-03-06 ENCOUNTER — Other Ambulatory Visit (HOSPITAL_COMMUNITY): Payer: Self-pay

## 2024-03-06 ENCOUNTER — Telehealth: Payer: Self-pay

## 2024-03-06 ENCOUNTER — Ambulatory Visit: Admitting: Family

## 2024-03-06 ENCOUNTER — Emergency Department (HOSPITAL_COMMUNITY)
Admission: EM | Admit: 2024-03-06 | Discharge: 2024-03-06 | Disposition: A | Discharge status: Home / Self Care | Attending: Emergency Medicine | Admitting: Emergency Medicine

## 2024-03-06 ENCOUNTER — Telehealth: Payer: Self-pay | Admitting: Gastroenterology

## 2024-03-06 ENCOUNTER — Emergency Department (HOSPITAL_COMMUNITY)

## 2024-03-06 ENCOUNTER — Encounter (HOSPITAL_COMMUNITY): Payer: Self-pay

## 2024-03-06 ENCOUNTER — Other Ambulatory Visit: Payer: Self-pay

## 2024-03-06 DIAGNOSIS — N289 Disorder of kidney and ureter, unspecified: Secondary | ICD-10-CM | POA: Diagnosis not present

## 2024-03-06 DIAGNOSIS — Z7901 Long term (current) use of anticoagulants: Secondary | ICD-10-CM | POA: Diagnosis not present

## 2024-03-06 DIAGNOSIS — R109 Unspecified abdominal pain: Secondary | ICD-10-CM | POA: Diagnosis present

## 2024-03-06 DIAGNOSIS — K529 Noninfective gastroenteritis and colitis, unspecified: Secondary | ICD-10-CM | POA: Diagnosis not present

## 2024-03-06 DIAGNOSIS — K51811 Other ulcerative colitis with rectal bleeding: Secondary | ICD-10-CM

## 2024-03-06 DIAGNOSIS — I10 Essential (primary) hypertension: Secondary | ICD-10-CM | POA: Diagnosis not present

## 2024-03-06 DIAGNOSIS — R1032 Left lower quadrant pain: Secondary | ICD-10-CM | POA: Diagnosis not present

## 2024-03-06 DIAGNOSIS — K760 Fatty (change of) liver, not elsewhere classified: Secondary | ICD-10-CM | POA: Diagnosis not present

## 2024-03-06 DIAGNOSIS — R1084 Generalized abdominal pain: Secondary | ICD-10-CM

## 2024-03-06 LAB — CBC WITH DIFFERENTIAL/PLATELET
Abs Immature Granulocytes: 0.05 10*3/uL (ref 0.00–0.07)
Basophils Absolute: 0 10*3/uL (ref 0.0–0.1)
Basophils Relative: 0 %
Eosinophils Absolute: 0.3 10*3/uL (ref 0.0–0.5)
Eosinophils Relative: 3 %
HCT: 43.3 % (ref 36.0–46.0)
Hemoglobin: 13.7 g/dL (ref 12.0–15.0)
Immature Granulocytes: 1 %
Lymphocytes Relative: 20 %
Lymphs Abs: 2 10*3/uL (ref 0.7–4.0)
MCH: 29.4 pg (ref 26.0–34.0)
MCHC: 31.6 g/dL (ref 30.0–36.0)
MCV: 92.9 fL (ref 80.0–100.0)
Monocytes Absolute: 0.6 10*3/uL (ref 0.1–1.0)
Monocytes Relative: 6 %
Neutro Abs: 7.1 10*3/uL (ref 1.7–7.7)
Neutrophils Relative %: 70 %
Platelets: 411 10*3/uL — ABNORMAL HIGH (ref 150–400)
RBC: 4.66 MIL/uL (ref 3.87–5.11)
RDW: 13.2 % (ref 11.5–15.5)
WBC: 10 10*3/uL (ref 4.0–10.5)
nRBC: 0 % (ref 0.0–0.2)

## 2024-03-06 LAB — URINALYSIS, ROUTINE W REFLEX MICROSCOPIC
Bacteria, UA: NONE SEEN
Bilirubin Urine: NEGATIVE
Glucose, UA: NEGATIVE mg/dL
Ketones, ur: NEGATIVE mg/dL
Leukocytes,Ua: NEGATIVE
Nitrite: NEGATIVE
Protein, ur: NEGATIVE mg/dL
Specific Gravity, Urine: <1.005 — ABNORMAL LOW (ref 1.005–1.030)
pH: 5 (ref 5.0–8.0)

## 2024-03-06 LAB — COMPREHENSIVE METABOLIC PANEL WITH GFR
ALT: 14 U/L (ref 0–44)
AST: 17 U/L (ref 15–41)
Albumin: 3.4 g/dL — ABNORMAL LOW (ref 3.5–5.0)
Alkaline Phosphatase: 79 U/L (ref 38–126)
Anion gap: 11 (ref 5–15)
BUN: 10 mg/dL (ref 8–23)
CO2: 24 mmol/L (ref 22–32)
Calcium: 9.3 mg/dL (ref 8.9–10.3)
Chloride: 106 mmol/L (ref 98–111)
Creatinine, Ser: 1.08 mg/dL — ABNORMAL HIGH (ref 0.44–1.00)
GFR, Estimated: 54 mL/min — ABNORMAL LOW (ref >60)
Glucose, Bld: 109 mg/dL — ABNORMAL HIGH (ref 70–99)
Potassium: 3.1 mmol/L — ABNORMAL LOW (ref 3.5–5.1)
Sodium: 141 mmol/L (ref 135–145)
Total Bilirubin: 0.6 mg/dL (ref 0.0–1.2)
Total Protein: 7 g/dL (ref 6.5–8.1)

## 2024-03-06 LAB — MAGNESIUM: Magnesium: 2.3 mg/dL (ref 1.7–2.4)

## 2024-03-06 LAB — LIPASE, BLOOD: Lipase: 38 U/L (ref 11–51)

## 2024-03-06 MED ORDER — SODIUM CHLORIDE 0.9 % IV BOLUS
500.0000 mL | Freq: Once | INTRAVENOUS | Status: AC
  Administered 2024-03-06: 500 mL via INTRAVENOUS

## 2024-03-06 MED ORDER — POTASSIUM CHLORIDE CRYS ER 20 MEQ PO TBCR
40.0000 meq | EXTENDED_RELEASE_TABLET | Freq: Once | ORAL | Status: AC
  Administered 2024-03-06: 40 meq via ORAL
  Filled 2024-03-06: qty 2

## 2024-03-06 MED ORDER — DEXAMETHASONE SODIUM PHOSPHATE 10 MG/ML IJ SOLN
10.0000 mg | Freq: Once | INTRAMUSCULAR | Status: AC
  Administered 2024-03-06: 10 mg via INTRAVENOUS
  Filled 2024-03-06: qty 1

## 2024-03-06 MED ORDER — IOHEXOL 300 MG/ML  SOLN
100.0000 mL | Freq: Once | INTRAMUSCULAR | Status: AC | PRN
  Administered 2024-03-06: 100 mL via INTRAVENOUS

## 2024-03-06 MED ORDER — BUDESONIDE ER 9 MG PO TB24: 9.0000 mg | ORAL_TABLET | Freq: Every day | ORAL | 1 refills | Status: DC

## 2024-03-06 MED ORDER — MORPHINE SULFATE (PF) 4 MG/ML IV SOLN
4.0000 mg | Freq: Once | INTRAVENOUS | Status: AC
  Administered 2024-03-06: 4 mg via INTRAVENOUS
  Filled 2024-03-06: qty 1

## 2024-03-06 MED ORDER — ONDANSETRON HCL 4 MG/2ML IJ SOLN
4.0000 mg | Freq: Once | INTRAMUSCULAR | Status: AC
  Administered 2024-03-06: 4 mg via INTRAVENOUS
  Filled 2024-03-06: qty 2

## 2024-03-06 NOTE — ED Provider Triage Note (Signed)
 Emergency Medicine Provider Triage Evaluation Note  Nicole Daniels , a 73 y.o. female  was evaluated in triage.  Pt complains of LLQ pain 1 week, worsening. Just got over diverticulitis treatment 1 month ago. Has felt chills.   Review of Systems  Positive: Abd pain, fever/chills Negative: CP  Physical Exam  BP (!) 150/84 (BP Location: Left Arm)   Pulse 87   Temp 98.3 F (36.8 C) (Oral)   Resp 18   Ht 5\' 5"  (1.651 m)   Wt 74.8 kg   SpO2 100%   BMI 27.46 kg/m  Gen:   Awake, no distress   Resp:  Normal effort  MSK:   Moves extremities without difficulty  Other:    Medical Decision Making  Medically screening exam initiated at 4:27 PM.  Appropriate orders placed.  Nicole Daniels was informed that the remainder of the evaluation will be completed by another provider, this initial triage assessment does not replace that evaluation, and the importance of remaining in the ED until their evaluation is complete.     Eudora Heron, PA-C 03/06/24 1628

## 2024-03-06 NOTE — ED Provider Notes (Signed)
 [place] EMERGENCY DEPARTMENT AT [facility] Provider Note   CSN: [id number] Arrival date & time: 03/06/24  1544     History  Chief Complaint  Patient presents with   Abdominal Pain    Nicole Daniels is a 73 y.o. female.  73 year old female with history of ulcerative colitis, also on Eliquis  for history of PE presents with concern for abdominal pain and bright red blood per rectum.  Patient states that she has had bouts of abdominal pain and GI bleed for the past several months, has been going to GI where she had a colonoscopy diagnosing the ulcerative colitis.  She has been back-and-forth to the ER, her PCP, her GI office with ongoing problems.  Presents today due to worsening pain in her belly and 12 loose bloody stools today.  Reports she was previously on Augmentin  for diverticulitis which she completed 1 week ago. Reports fever to 101 last Friday, her thermometer battery died and she has not been tracking her temp but has felt feverish off and on since then. Nausea without vomiting. Denies changes in bladder habits.        Home Medications Prior to Admission medications   Medication Sig Start Date End Date Taking? Authorizing Provider  acetaminophen  (TYLENOL ) 325 MG tablet Take 650 mg by mouth every 6 (six) hours as needed.    [provider]  albuterol  (VENTOLIN  HFA) 108 (90 Base) MCG/ACT inhaler Inhale 2 puffs into the lungs every 6 (six) hours as needed for wheezing or shortness of breath. 12/12/23   Bedsole, Amy E, MD  apixaban  (ELIQUIS ) 5 MG TABS tablet Take 1 tablet (5 mg total) by mouth 2 (two) times daily. 01/23/24   Shellie Dials, MD  Budesonide  ER (UCERIS ) 9 MG TB24 Take 1 tablet (9 mg total) by mouth daily. 03/06/24   May, Deanna J, NP  chlorpheniramine  (ALLERGY RELIEF) 4 MG tablet Take 1 tablet (4 mg total) by mouth 2 (two) times daily as needed for allergies. 10/11/23   Dugal, Tabitha, FNP  Cholecalciferol  (VITAMIN D3) 2000 units TABS Take  2,000 Units by mouth daily.    [provider]  Cyanocobalamin  (VITAMIN B 12 PO) Take by mouth.    [provider]  ezetimibe  (ZETIA ) 10 MG tablet Take 1 tablet (10 mg total) by mouth daily. 11/07/23   Felicita Horns, FNP  fluticasone  (FLONASE ) 50 MCG/ACT nasal spray Place 1 spray into both nostrils as needed for allergies.     [provider]  levothyroxine  (SYNTHROID ) 75 MCG tablet Take 1 tablet (75 mcg total) by mouth daily. 11/07/23   Dugal, Tabitha, FNP  meclizine  (ANTIVERT ) 12.5 MG tablet Take 1 tablet (12.5 mg total) by mouth 3 (three) times daily as needed for dizziness. 03/21/23   Felicita Horns, FNP  mesalamine  (LIALDA ) 1.2 g EC tablet Take 4 tablets (4.8 g total) by mouth daily with breakfast. 02/28/24   May, Deanna J, NP  ondansetron  (ZOFRAN -ODT) 4 MG disintegrating tablet Take 1 tablet (4 mg total) by mouth every 8 (eight) hours as needed for nausea or vomiting. 03/05/24   May, Deanna J, NP  OVER THE COUNTER MEDICATION Sinus and pain, take PRN.    [provider]  OVER THE COUNTER MEDICATION Pro-biotic one capsule daily.    [provider]  pantoprazole  (PROTONIX ) 20 MG tablet TAKE 1 TABLET BY MOUTH EVERY DAY Patient not taking: Reported on 02/11/2024 02/08/24   Dugal, Tabitha, FNP  potassium chloride  SA (KLOR-CON  M) 20 MEQ tablet  Take 1 tablet (20 mEq total) by mouth 2 (two) times daily. 03/03/24 04/02/24  Dugal, Tabitha, FNP      Allergies    Simvastatin, Statins, Bactrim [sulfamethoxazole -trimethoprim], and Sertraline     Review of Systems   Review of Systems Negative except as per HPI Physical Exam Updated Vital Signs BP (!) 158/73 (BP Location: Left Arm)   Pulse 90   Temp 98 F (36.7 C) (Oral)   Resp 20   Ht 5\' 5"  (1.651 m)   Wt 74.8 kg   SpO2 99%   BMI 27.46 kg/m  Physical Exam Vitals and nursing note reviewed.  Constitutional:      General: She is not in acute distress.    Appearance: She is well-developed. She is not diaphoretic.   HENT:     Head: Normocephalic and atraumatic.  Cardiovascular:     Rate and Rhythm: Normal rate and regular rhythm.     Heart sounds: Normal heart sounds.  Pulmonary:     Effort: Pulmonary effort is normal.     Breath sounds: Normal breath sounds.  Abdominal:     Palpations: Abdomen is soft.     Tenderness: There is abdominal tenderness.  Skin:    General: Skin is warm and dry.     Findings: No erythema or rash.  Neurological:     Mental Status: She is alert and oriented to person, place, and time.  Psychiatric:        Behavior: Behavior normal.     ED Results / Procedures / Treatments   Labs (all labs ordered are listed, but only abnormal results are displayed) Labs Reviewed  COMPREHENSIVE METABOLIC PANEL WITH GFR - Abnormal; Notable for the following components:      Result Value   Potassium 3.1 (*)    Glucose, Bld 109 (*)    Creatinine, Ser 1.08 (*)    Albumin 3.4 (*)    GFR, Estimated 54 (*)    All other components within normal limits  CBC WITH DIFFERENTIAL/PLATELET - Abnormal; Notable for the following components:   Platelets 411 (*)    All other components within normal limits  C DIFFICILE QUICK SCREEN W PCR REFLEX    LIPASE, BLOOD  MAGNESIUM   URINALYSIS, ROUTINE W REFLEX MICROSCOPIC    EKG None  Radiology CT ABDOMEN PELVIS W CONTRAST Result Date: 03/06/2024 CLINICAL DATA:  Left lower quadrant abdominal pain. EXAM: CT ABDOMEN AND PELVIS WITH CONTRAST TECHNIQUE: Multidetector CT imaging of the abdomen and pelvis was performed using the standard protocol following bolus administration of intravenous contrast. RADIATION DOSE REDUCTION: This exam was performed according to the departmental dose-optimization program which includes automated exposure control, adjustment of the mA and/or kV according to patient size and/or use of iterative reconstruction technique. CONTRAST:  OMNIPAQUE  IOHEXOL  300 MG/ML  SOLN COMPARISON:  CT abdomen and pelvis 02/07/2024.  FINDINGS: Lower chest: No acute abnormality. Hepatobiliary: There is diffuse fatty infiltration of the liver. Gallbladder surgically absent. There is no biliary ductal dilatation. Pancreas: Unremarkable. No pancreatic ductal dilatation or surrounding inflammatory changes. Spleen: Normal in size without focal abnormality. Adrenals/Urinary Tract: The bladder is completely decompressed, but otherwise within normal limits. There is no hydronephrosis or perinephric fat stranding. Mildly hyperdense rounded area noted in the right kidney measuring 15 mm similar to the prior study. The adrenal glands are within normal limits. Stomach/Bowel: There is diffuse colonic wall thickening. The appendix is not seen. There are small lymph nodes adjacent to the descending colon and sigmoid colon. There  is no bowel obstruction, pneumatosis or free air. Small bowel and stomach are within normal limits. Vascular/Lymphatic: Aortic atherosclerosis. No enlarged abdominal or pelvic lymph nodes. Reproductive: Status post hysterectomy. No adnexal masses. Other: No abdominal wall hernia or abnormality. No abdominopelvic ascites. Musculoskeletal: No acute or significant osseous findings. IMPRESSION: 1. Diffuse colonic wall thickening compatible with colitis. 2. Hepatic steatosis. 3. Stable mildly hyperdense right renal lesion, indeterminate. Recommend further evaluation with renal ultrasound. 4. Aortic atherosclerosis. Aortic Atherosclerosis (ICD10-I70.0). Electronically Signed   By: Tyron Gallon M.D.   On: 03/06/2024 19:57    Procedures Procedures    Medications Ordered in ED Medications  iohexol  (OMNIPAQUE ) 300 MG/ML solution 100 mL (100 mLs Intravenous Contrast Given 03/06/24 1901)  sodium chloride  0.9 % bolus 500 mL (500 mLs Intravenous Bolus 03/06/24 2014)  ondansetron  (ZOFRAN ) injection 4 mg (4 mg Intravenous Given 03/06/24 2114)  morphine  (PF) 4 MG/ML injection 4 mg (4 mg Intravenous Given 03/06/24 2114)  potassium chloride  SA  (KLOR-CON  M) CR tablet 40 mEq (40 mEq Oral Given 03/06/24 2113)  dexamethasone  (DECADRON ) injection 10 mg (10 mg Intravenous Given 03/06/24 2113)    ED Course/ Medical Decision Making/ A&P                                 Medical Decision Making Risk Prescription drug management.   This patient presents to the ED for concern of abdominal pain and bright red blood per rectum, this involves an extensive number of treatment options, and is a complaint that carries with it a high risk of complications and morbidity.  The differential diagnosis includes diverticulitis, c diff, colitis, anemia   Co morbidities that complicate the patient evaluation  PT, AFO on Eliquis ; ulcerative colitis; HTN;    Additional history obtained:  Additional history obtained from friend at bedside who contributes to history as above External records from outside source obtained and reviewed including prior labs and imaging on file   Lab Tests:  I Ordered, and personally interpreted labs.  The pertinent results include:  CBC without significant findings, CMP with mild hypokalemia. Lipase WNL. Mag WNL.    Imaging Studies ordered:  I ordered imaging studies including CT a/p  I independently visualized and interpreted imaging which showed colitis, no free air I agree with the radiologist interpretation   Problem List / ED Course / Critical interventions / Medication management  73 year old female with history of ulcerative colitis here with BRBPR, abdominal pain and report of fever although afebrile here.  Workup today is largely reassuring.  She does have mild hypokalemia which is replaced orally.  Her CT does not show concerning process such as abscess, diverticulitis or free air.  Patient was offered admission for pain control, she has declined.  She does agree to take the morphine  prior to discharge to help her be comfortable through the night with plan to see her gastroenterology team tomorrow.  She is  provided with single dose of Decadron  here, she is pending prior authorization for steroids from GI versus change to oral steroids if unable to get prior Auth approved.  Provided return precautions.  Patient requesting discharge. I ordered medication including morphine , zofran  for pain, kdur for mild hypokalemia, decadron   for UC, pending starting steroids with GI tomorrow.  Reevaluation of the patient after these medicines showed that the patient resolved I have reviewed the patients home medicines and have made adjustments as needed  Consultations Obtained:  I requested consultation with the ER attending, Dr. Ranelle Buys,  and discussed lab and imaging findings as well as pertinent plan - they recommend: decadron  IV   Social Determinants of Health:  Has PCP and GI care teams. [facility]    Test / Admission - Considered:  Offered admission for pain control. Patient would like to be dc to follow up with her GI tomorrow as scheduled.          Final Clinical Impression(s) / ED Diagnoses Final diagnoses:  Generalized abdominal pain  Colitis    Rx / DC Orders ED Discharge Orders     None         Darlis Eisenmenger, PA-C 03/06/24 2159    Sallyanne Creamer, DO 03/10/24 [phone]

## 2024-03-06 NOTE — Telephone Encounter (Signed)
 Returned patient call & she stated she talked with Deanna earlier today, and since then her pain has worsened (8/10) in the LLQ, nausea w/o vomiting. She's had at least 9 episodes of bloody, loose stools (moderate amount) since 6:30 am this morning. Recently stopped medication. She's scheduled to see Deanna tomorrow & unsure if she can wait that long to be seen. Secure chat sent to [facility] as well.

## 2024-03-06 NOTE — Telephone Encounter (Signed)
 Pharmacy Patient Advocate Encounter   Received notification from CoverMyMeds that prior authorization for Budesonide ER 9MG  er tablets is required/requested.   Insurance verification completed.   The patient is insured through [employer] .   Per test claim:  Sulfasalazine, Balsalazide, Mesalamine  Enema, Mesalamine  0.375 ER Cap is preferred by the insurance.  If suggested medication is appropriate, Please send in a new RX and discontinue this one. If not, please advise as to why it's not appropriate so that we may request a Prior Authorization. Please note, some preferred medications may still require a PA.  If the suggested medications have not been trialed and there are no contraindications to their use, the PA will not be submitted, as it will not be approved.

## 2024-03-06 NOTE — Telephone Encounter (Signed)
 Spoke with provider via secure chat. Recommendations are for patient to go to ED for sooner evaluation. Pt made aware & plans to go to [facility]. Advised her I would make our providers aware who are covering the hospital.

## 2024-03-06 NOTE — Telephone Encounter (Signed)
 Inbound call from patient stating since speaking with Deanna she has been in the bathroom with severe abdominal pain and bloody stools. Patient is unsure that she can wait until to tomorrow to be seen. Requesting a urgent call back. Please advise, thank you.

## 2024-03-06 NOTE — Telephone Encounter (Signed)
 Deanna, FYI, patient has not yet arrived to [facility].

## 2024-03-06 NOTE — Telephone Encounter (Signed)
 Spoke with patient about my discussion with Dr. Yvone Herd. She skipped her dose of lialda  this morning has not had any symptoms as of this morning but she does tell me she had abdominal discomfort throughout the night and she didn't sleep well. Will go ahead and send RX for budesonide 9mg  po daily. If too expensive will have to switch to prednisone  taper. She will come into the office tomorrow for labs and stool tests.

## 2024-03-06 NOTE — ED Triage Notes (Signed)
 Pt to er, pt states that she has been dx with diverticulitis and ulcerative colitis.  States that she was on some medications, but they had to change them and now she is having pain and bleeding again.

## 2024-03-06 NOTE — Discharge Instructions (Signed)
 Provided with dose of Decadron  in the ER. This is a steroid medication.  Follow up with your GI team tomorrow as scheduled.   Return to ER for worsening or concerning symptoms.

## 2024-03-06 NOTE — Telephone Encounter (Signed)
 error

## 2024-03-06 NOTE — Telephone Encounter (Signed)
 Error

## 2024-03-07 ENCOUNTER — Ambulatory Visit: Admitting: Gastroenterology

## 2024-03-07 ENCOUNTER — Other Ambulatory Visit (INDEPENDENT_AMBULATORY_CARE_PROVIDER_SITE_OTHER)

## 2024-03-07 ENCOUNTER — Encounter: Payer: Self-pay | Admitting: Gastroenterology

## 2024-03-07 ENCOUNTER — Other Ambulatory Visit (HOSPITAL_COMMUNITY): Payer: Self-pay

## 2024-03-07 VITALS — BP 100/50 | HR 84 | Ht 62.5 in | Wt 171.4 lb

## 2024-03-07 DIAGNOSIS — R197 Diarrhea, unspecified: Secondary | ICD-10-CM

## 2024-03-07 DIAGNOSIS — K625 Hemorrhage of anus and rectum: Secondary | ICD-10-CM

## 2024-03-07 DIAGNOSIS — K51811 Other ulcerative colitis with rectal bleeding: Secondary | ICD-10-CM

## 2024-03-07 DIAGNOSIS — E876 Hypokalemia: Secondary | ICD-10-CM | POA: Diagnosis not present

## 2024-03-07 DIAGNOSIS — R1032 Left lower quadrant pain: Secondary | ICD-10-CM

## 2024-03-07 LAB — SEDIMENTATION RATE: Sed Rate: 23 mm/h (ref 0–30)

## 2024-03-07 LAB — POTASSIUM: Potassium: 3.8 meq/L (ref 3.5–5.1)

## 2024-03-07 LAB — MAGNESIUM: Magnesium: 2 mg/dL (ref 1.5–2.5)

## 2024-03-07 LAB — C-REACTIVE PROTEIN: CRP: 1.6 mg/dL (ref 0.5–20.0)

## 2024-03-07 NOTE — Telephone Encounter (Signed)
 Pharmacy Patient Advocate Encounter   Received notification from CoverMyMeds that prior authorization for Budesonide  ER 9MG  er tablets is required/requested.   Insurance verification completed.   The patient is insured through [employer] .   Prior Authorization for Budesonide  ER 9MG  er tablets has been APPROVED from 10-31-2023 to 10-29-2024   PA #/Case ID/Reference #: Nicole [id number]

## 2024-03-07 NOTE — Patient Instructions (Signed)
 Your provider has requested that you go to the basement level for lab work before leaving today. Press "B" on the elevator. The lab is located at the first door on the left as you exit the elevator.  _______________________________________________________  If your blood pressure at your visit was 140/90 or greater, please contact your primary care physician to follow up on this.  _______________________________________________________  If you are age 74 or older, your body mass index should be between 23-30. Your Body mass index is 30.85 kg/m. If this is out of the aforementioned range listed, please consider follow up with your Primary Care Provider.  If you are age 21 or younger, your body mass index should be between 19-25. Your Body mass index is 30.85 kg/m. If this is out of the aformentioned range listed, please consider follow up with your Primary Care Provider.   ________________________________________________________  The [facility] providers would like to encourage you to use MYCHART to communicate with providers for non-urgent requests or questions.  Due to long hold times on the telephone, sending your provider a message by [facility] may be a faster and more efficient way to get a response.  Please allow 48 business hours for a response.  Please remember that this is for non-urgent requests.  _______________________________________________________  Thank you for trusting me with your gastrointestinal care!   Dyanna Glasgow, RNP

## 2024-03-07 NOTE — Progress Notes (Signed)
 Chief Complaint:follow-up UC Primary GI Doctor: Dr. Rosaline Coma  HPI: 73 year old female history of hypothyroidism, BPPV, PFO, tubular adenomas,history of polyps and diverticulosis noted on colonoscopy last year, DVT/PE LLE currently on apixaban  for less than a month (started 12/24/23 after ED diagnosis)  presents for evaluation of rectal bleeding.   Patient states in August she had a sinus infection and was given a 14-day course of Augmentin .  Midway through the course of Augmentin  she developed diarrhea and associated with her diarrhea she noticed a "red jelly" substance that would come out in the stool and on the tissue paper.  She does have history of internal hemorrhoids and has had banding in the past.   She came in for lab work 07/19/2023 with normal CBC and CMP.  Since she was having LLQ pain CT abdomen pelvis with contrast 07/19/2023 was obtained and showed diverticulosis without acute diverticulitis.   She continues to have 3-4 stools per day.  Occasional fecal incontinence with urinating.  Stools vary from watery to "sand" to sometimes pebbles.  She has associated LLQ pain that is relieved with a bowel movement.  She typically will see this "red jelly substance" with each bowel movement. Reports some nausea, no vomiting.  Denies unintentional weight loss. 08/09/23 Stool study via diatherix is negative for infections including C diff    08/27/2023 patient last seen in GI office with Dr. Rosaline Coma for hemorrhoid banding. At that time patient was still having diarrhea with 3-4 bowel movements per day for the last couple of months after her colonoscopy.  She does have some rectal bleeding and rectal pain.  Anusol  suppositories have not really helped.  Dr. Rosaline Coma recommended trial of Imodium 2 mg as needed to help with diarrhea. 01/11/2024 patient seen in ED for complaints of rectal bleeding. She has developed some abdominal pain, intermittent, waxing and waning, usually worse on the left side.  Hemoccult performed, but no stool in rectal vault. Patient has pictures of stool that show obvious bleeding, red blood. Hemoccult not sent due to poor sample. CT, no obvious infection. Stable renal cyst. I would recommend IVC filter eval. Can resume eliquis  (at least) low dose from tomorrow.  Labs show: Potassium 3.2, BUN 10, creatinine 0.89, normal LFTs, WBC 6.2, hemoglobin 14, platelets 419 01/19/2024 patient seen in ED for left leg pain.  Patient did states she fell off a curb a couple days ago.Bilateral LE US , which revealed DVT in L tibial and perioneal veins which is improved from prior US , no new clot burden visualized.  01/23/2024 patient seen by oncology to evaluate for elevated factor VIII level in the setting of acute blood clot and anticoagulation.  Recommend patient stay on Eliquis  5 mg twice daily per day for total of 6 months completing treatment at the end of August.  01/28/24 patient seen by myself in the office with main complaint of rectal bleeding and abdominal pain.  Upon examination it was noted she had liquid brown mixed with red stool with mucus.  Ordered flexible sigmoidoscopy. Flex sig on 4/1 showed - Diffuse inflammation was found in the rectum, in the sigmoid colon and in the descending colon. Biopsied.  - Diverticulosis in the sigmoid colon.  - Non- bleeding internal hemorrhoids. Path: showed that she has left-sided inflammation in her colon as a result of ulcerative colitis.  Patient started on Rowasa  enemas. 02/07/24 ED visit for generalized feelings of unwellness, nausea, left lower quadrant pain. CT abdomen pelvis demonstrates acute uncomplicated diverticulitis. Augmentin  for 7 days  given in ED and sent to pharmacy.   02/11/24 patient followed up with me in the office and at that time was doing well on Rowasa  enemas.  She was also completing Augmentin  for recent diagnosis of acute uncomplicated diverticulitis.  Patient had expressed some left lower quadrant abdominal  discomfort with nausea.  She was also being supplemented for low potassium. 02/28/2024 patient was having issues with enemas staying in therefore we offered switching to oral mesalamine .  Patient started mesalamine  4.8 g and within the first week patient started to experience worsening diarrhea, fever, headache, and poor appetite.  Instructed patient to discontinue mesalamine  and order placed for budesonide .  Unfortunately patient started to feel worse after conversation with severe abdominal pain and went to ED. 03/06/2024 patient presents to ED with complaints of abdominal pain with loose bloody stools and fever of 101.  Nausea without vomiting.CT does not show concerning process such as abscess, diverticulitis or free air. Diffuse colonic wall thickening compatible with colitis.   Labs show: Normal lipase, normal hemoglobin 13.7, WBC 10, magnesium  2.3, potassium 3.1 which was corrected.  Patient did not wish to be admitted and discharged home.  Patient received dose of Decadron .  Interval History    Patient presents for follow-up after ED visit yesterday, accompanied by her sister. She states since she received decadron  for the abdominal pain yesterday and it has significantly decreased. She states she has only passed small pellets today. She has small amount of blood with wiping two episodes. She still has poor appetite. Also notes some dizziness.  Wt Readings from Last 3 Encounters:  03/07/24 171 lb 6 oz (77.7 kg)  03/06/24 165 lb (74.8 kg)  02/11/24 170 lb 4 oz (77.2 kg)    Past Medical History:  Diagnosis Date   Abdominal pain, right lower quadrant    Acquired cyst of kidney 01/08/2012   pt denies history of    Acute sinusitis, unspecified    Adenomatous colon polyp    Allergy    Angiomyolipoma    kidney   Anxiety    OCCASIONAL   Cataract    Chest pain    Chest pain, unspecified    Depressive disorder, not elsewhere classified 01/08/2012   not currently    Diverticulosis    Dizziness  and giddiness    DVT (deep venous thrombosis) (HCC)    Dysfunction of eustachian tube    H/O Spinal surgery 02/20/2017   Hemorrhoids    History of diverticulitis of colon    History of fusion of cervical spine 04/25/2018   IBS (irritable bowel syndrome)    Liver lesion 2011   Neoplasm of skin of face    malignant   OA (osteoarthritis)    knee, NECK   Obesity, unspecified    Ostium secundum type atrial septal defect    Other and unspecified hyperlipidemia    Other malaise and fatigue    Palpitations    Pulmonary embolism (HCC)    Shortness of breath    Thyroid  disease    Unspecified essential hypertension    pt states she does not have HTN   Unspecified sinusitis (chronic)    Unspecified vitamin D  deficiency     Past Surgical History:  Procedure Laterality Date   ABDOMINAL HYSTERECTOMY     BREAST REDUCTION SURGERY  1990   CERVICAL DISC SURGERY     Dr.Nudleman   CHOLECYSTECTOMY     COLONOSCOPY     DILATION AND CURETTAGE OF UTERUS  EYE SURGERY Bilateral 07/30/2017   cataract sx   FLEXIBLE SIGMOIDOSCOPY  01/08/2012   Procedure: FLEXIBLE SIGMOIDOSCOPY;  Surgeon: Pietro Bridegroom, MD;  Location: [facility];  Service: Endoscopy;  Laterality: N/A;   HEMORRHOID SURGERY     KNEE ARTHROTOMY     pt states no    LAMINECTOMY N/A 01/31/2017   Procedure: Thoracic Laminectomy and marsupialization of arachnoid cyst;  Surgeon: Yvonna Herder, MD;  Location[facility];  Service: Neurosurgery;  Laterality: N/A;   LEFT OOPHORECTOMY  1976   NECK SURGERY N/A 03/2018   PARTIAL HYSTERECTOMY     right ovary remains   REDUCTION MAMMAPLASTY     TONSILLECTOMY  1972   UPPER GASTROINTESTINAL ENDOSCOPY      Current Outpatient Medications  Medication Sig Dispense Refill   acetaminophen  (TYLENOL ) 325 MG tablet Take 650 mg by mouth every 6 (six) hours as needed.     albuterol  (VENTOLIN  HFA) 108 (90 Base) MCG/ACT inhaler Inhale 2 puffs into the lungs every 6 (six) hours as needed for wheezing or  shortness of breath. 8 g 2   apixaban  (ELIQUIS ) 5 MG TABS tablet Take 1 tablet (5 mg total) by mouth 2 (two) times daily. 60 tablet 6   chlorpheniramine  (ALLERGY RELIEF) 4 MG tablet Take 1 tablet (4 mg total) by mouth 2 (two) times daily as needed for allergies.     Cholecalciferol  (VITAMIN D3) 2000 units TABS Take 2,000 Units by mouth daily.     Cyanocobalamin  (VITAMIN B 12 PO) Take by mouth.     ezetimibe  (ZETIA ) 10 MG tablet Take 1 tablet (10 mg total) by mouth daily. 90 tablet 3   fluticasone  (FLONASE ) 50 MCG/ACT nasal spray Place 1 spray into both nostrils as needed for allergies.      levothyroxine  (SYNTHROID ) 75 MCG tablet Take 1 tablet (75 mcg total) by mouth daily. 90 tablet 3   meclizine  (ANTIVERT ) 12.5 MG tablet Take 1 tablet (12.5 mg total) by mouth 3 (three) times daily as needed for dizziness. 30 tablet 0   OVER THE COUNTER MEDICATION Sinus and pain, take PRN.     OVER THE COUNTER MEDICATION Pro-biotic one capsule daily.     potassium chloride  SA (KLOR-CON  M) 20 MEQ tablet Take 1 tablet (20 mEq total) by mouth 2 (two) times daily. 60 tablet 0   Budesonide  ER (UCERIS ) 9 MG TB24 Take 1 tablet (9 mg total) by mouth daily. (Patient not taking: Reported on 03/07/2024) 30 tablet 1   ondansetron  (ZOFRAN -ODT) 4 MG disintegrating tablet Take 1 tablet (4 mg total) by mouth every 8 (eight) hours as needed for nausea or vomiting. (Patient not taking: Reported on 03/07/2024) 20 tablet 0   pantoprazole  (PROTONIX ) 20 MG tablet TAKE 1 TABLET BY MOUTH EVERY DAY (Patient not taking: Reported on 03/07/2024) 90 tablet 1   No current facility-administered medications for this visit.    Allergies as of 03/07/2024 - Review Complete 03/07/2024  Allergen Reaction Noted   Simvastatin Other (See Comments) 01/27/2011   Statins Other (See Comments) 05/14/2017   Bactrim [sulfamethoxazole -trimethoprim] Itching 11/12/2013   Sertraline  Anxiety 04/10/2019    Family History  Problem Relation Age of Onset    Hypertension Mother    Diabetes Mother    Cirrhosis Father    Heart disease Father    Hypertension Sister    Diabetes Sister    Heart disease Brother        x 2   Breast cancer Cousin 37 - 59   Diabetes  Cousin    Diabetes Other        aunt   Stroke Other        aunt   Colon cancer Neg Hx    Esophageal cancer Neg Hx    Stomach cancer Neg Hx    Rectal cancer Neg Hx    Colon polyps Neg Hx     Review of Systems:    Constitutional: No weight loss, fever, chills, weakness or fatigue HEENT: Eyes: No change in vision               Ears, Nose, Throat:  No change in hearing or congestion Skin: No rash or itching Cardiovascular: No chest pain, chest pressure or palpitations   Respiratory: No SOB or cough Gastrointestinal: See HPI and otherwise negative Genitourinary: No dysuria or change in urinary frequency Neurological: No headache, dizziness or syncope Musculoskeletal: No new muscle or joint pain Hematologic: No bleeding or bruising Psychiatric: No history of depression or anxiety    Physical Exam:  Vital signs: BP (!) 100/50 (BP Location: Left Arm, Patient Position: Sitting, Cuff Size: Large)   Pulse 84   Ht 5' 2.5" (1.588 m) Comment: height measured without shoes  Wt 171 lb 6 oz (77.7 kg)   BMI 30.85 kg/m   Constitutional:   Pleasant  female appears to be in NAD, Well developed, Well nourished, alert and cooperative Throat: Oral cavity and pharynx without inflammation, swelling or lesion.  Respiratory: Respirations even and unlabored. Lungs clear to auscultation bilaterally.   No wheezes, crackles, or rhonchi.  Cardiovascular: Normal S1, S2. Regular rate and rhythm. No peripheral edema, cyanosis or pallor.  Gastrointestinal:  Soft, nondistended, nontender. No rebound or guarding. Normal bowel sounds. No appreciable masses or hepatomegaly. Rectal:  Not performed.  Msk:  Symmetrical without gross deformities. Without edema, no deformity or joint abnormality.  Neurologic:   Alert and  oriented x4;  grossly normal neurologically.  Skin:   Dry and intact without significant lesions or rashes. Psychiatric: Oriented to person, place and time. Demonstrates good judgement and reason without abnormal affect or behaviors.  RELEVANT LABS AND IMAGING: CBC    Latest Ref Rng & Units 03/06/2024    5:17 PM 02/07/2024    7:02 PM 01/28/2024    2:48 PM  CBC  WBC 4.0 - 10.5 K/uL 10.0  7.1  7.2   Hemoglobin 12.0 - 15.0 g/dL 16.1  09.6  04.5   Hematocrit 36.0 - 46.0 % 43.3  39.3  41.9   Platelets 150 - 400 K/uL 411  408  415.0      CMP     Latest Ref Rng & Units 03/06/2024    5:17 PM 02/27/2024    2:27 PM 02/07/2024    7:02 PM  CMP  Glucose 70 - 99 mg/dL 409   811   BUN 8 - 23 mg/dL 10   11   Creatinine 9.14 - 1.00 mg/dL 7.82   9.56   Sodium 213 - 145 mmol/L 141   142   Potassium 3.5 - 5.1 mmol/L 3.1  3.1  3.0   Chloride 98 - 111 mmol/L 106   103   CO2 22 - 32 mmol/L 24   28   Calcium  8.9 - 10.3 mg/dL 9.3   9.0   Total Protein 6.5 - 8.1 g/dL 7.0   6.7   Total Bilirubin 0.0 - 1.2 mg/dL 0.6   0.4   Alkaline Phos 38 - 126 U/L 79   71  AST 15 - 41 U/L 17   15   ALT 0 - 44 U/L 14   10      Lab Results  Component Value Date   TSH 1.30 10/11/2023    Previous GI workup: 01/11/24 CT Abd/pelvis IMPRESSION: 1. No acute inflammatory process identified within the abdomen or pelvis. 2. Multiple other nonacute observations, as described above. 02/07/24 CT abdomen pelvis with contrast IMPRESSION: 1. Mild acute uncomplicated diverticulitis of the sigmoid colon. No perforation, fluid collection, or abscess. 2.  Aortic Atherosclerosis (ICD10-I70.0).   Colonoscopy 05/08/2023 for personal history of colon polyps and positive fit test - The examined portion of the ileum was normal.  - Four 3 to 6 mm polyps (tubular adenomas) in the ascending colon and in the cecum, removed with a cold snare. Resected and retrieved.  - Diverticulosis in the sigmoid colon. - One 3 mm polyp  (polyploid benign mucosa) in the sigmoid colon, removed with a cold snare. Resected and retrieved. - Non- bleeding external and internal hemorrhoids.  Path: Diagnosis 1. Surgical [P], colon, ascending and cecum, polyp (4) - TUBULAR ADENOMA(S). - NO HIGH GRADE DYSPLASIA OR MALIGNANCY. - POLYPOID FRAGMENT OF BENIGN COLONIC MUCOSA WITH LYMPHOID AGGREGATE 2. Surgical [P], colon, sigmoid, polyp (1) - POLYPOID FRAGMENT OF BENIGN COLONIC MUCOSA WITH LYMPHOID AGGREGATE   Colonoscopy 06/16/2021 with Dr. Savannah Curlin for personal history of colon polyps - Hemorrhoids found on perianal exam.  - Non- bleeding internal hemorrhoids.  - Diverticulosis in the sigmoid colon and in the descending colon.  - One less than 1 mm polyp in the descending colon, removed with a cold biopsy forceps. Resected and retrieved.  - Three 2 to 3 mm polyps in the ascending colon, removed with a cold snare. Resected and retrieved.  - Two 1 to 2 mm polyps in the cecum, removed with a cold snare. Resected and retrieved.  - The examination was otherwise normal on direct and retroflexion views. - repeat 3 years Diagnosis 1. Surgical [P], colon, cecum, polyp (2) - TUBULAR ADENOMA WITHOUT HIGH-GRADE DYSPLASIA OR MALIGNANCY - OTHER FRAGMENT OF POLYPOID COLONIC MUCOSA WITH PROMINENT LYMPHOID AGGREGATES 2. Surgical [P], colon, ascending, polyp (3) - TUBULAR ADENOMA(S) WITHOUT HIGH-GRADE DYSPLASIA OR MALIGNANCY 3. Surgical [P], colon, descending, polyp (1) - BENIGN LEIOMYOMA  Assessment: Encounter Diagnoses  Name Primary?   Other ulcerative colitis with rectal bleeding (HCC) Yes   Diarrhea, unspecified type    LLQ abdominal pain    Rectal bleeding   73 year old female patient with hx of Ulcerative colitis recently diagnosed on flexible sig. Patient initially started on Rowasa  suppositories, but unfortunately patient had issues with using the Rowasa  suppositories despite education on maneuvering them. We switched her to oral  mesalamine  and she started to experience refractory diarrhea within the first week of starting medication, so I stopped the medication. Sent in Budesonide  9 mg po daily to pharmacy and received PA to start medication today. Patient went to ED yesterday for worsening abdominal pain and diarrhea with concerns for diverticulitis. CT scab abd/pelvis negative for diverticulitis. Diffuse colonic wall thickening compatible with colitis. She is feeling a little better today, however not in taking a lot of food. Will recheck inflammatory markers. Will check stool GI profile with cdiff to r/o enteric infection. Will discuss case with Dr. Rosaline Coma when she returns, Jarick Harkins need to consider other biological therapies.  Plan: -Start Budesonide  ER 9 mg po daily, PA approved.  -Stopped mesalamine  oral po daily - check CRP, ESR today -Check  GI profile with cdiff - Check Fecal calprotectin  Thank you for the courtesy of this consult. Please call me with any questions or concerns.   Chau Savell, FNP-C [facility] 03/07/2024, 2:12 PM  Cc: Felicita Horns, FNP

## 2024-03-07 NOTE — Telephone Encounter (Signed)
 Pt is active on mychart & has been made aware.

## 2024-03-10 ENCOUNTER — Encounter: Payer: Self-pay | Admitting: Family

## 2024-03-10 ENCOUNTER — Other Ambulatory Visit

## 2024-03-10 DIAGNOSIS — R1032 Left lower quadrant pain: Secondary | ICD-10-CM | POA: Diagnosis not present

## 2024-03-10 DIAGNOSIS — R197 Diarrhea, unspecified: Secondary | ICD-10-CM

## 2024-03-10 DIAGNOSIS — K625 Hemorrhage of anus and rectum: Secondary | ICD-10-CM | POA: Diagnosis not present

## 2024-03-10 DIAGNOSIS — K51811 Other ulcerative colitis with rectal bleeding: Secondary | ICD-10-CM

## 2024-03-11 ENCOUNTER — Ambulatory Visit: Payer: Self-pay | Admitting: Gastroenterology

## 2024-03-11 LAB — GI PROFILE, STOOL, PCR

## 2024-03-12 LAB — CALPROTECTIN, FECAL: Calprotectin, Fecal: 3330 ug/g — ABNORMAL HIGH (ref 0–120)

## 2024-03-17 NOTE — Progress Notes (Signed)
 I agree with the assessment and plan as outlined by Ms. May. I agree with budesonide  therapy for now. Hopefully this will help with her symptoms. I think it would be reasonable to initiate Entyvio as maintenance therapy if patient responds to budesonide . If patient does not respond to budesonide , we may need to consider Remicade therapy versus referral to colorectal surgery.

## 2024-03-25 ENCOUNTER — Other Ambulatory Visit: Payer: Self-pay | Admitting: Family

## 2024-03-25 DIAGNOSIS — E876 Hypokalemia: Secondary | ICD-10-CM

## 2024-03-31 ENCOUNTER — Ambulatory Visit: Admitting: Gastroenterology

## 2024-04-07 ENCOUNTER — Other Ambulatory Visit: Payer: Self-pay | Admitting: Family

## 2024-04-07 DIAGNOSIS — Z1231 Encounter for screening mammogram for malignant neoplasm of breast: Secondary | ICD-10-CM

## 2024-04-15 ENCOUNTER — Telehealth: Payer: Self-pay | Admitting: Gastroenterology

## 2024-04-15 NOTE — Telephone Encounter (Signed)
 Inbound call from [facility] requesting a f/u call on the providers line at [phone] in regards to patent having a claim resubmitted.   Claim from 02/11/24  Please advise.

## 2024-04-17 NOTE — Progress Notes (Unsigned)
 Chief Complaint:follow-up UC Primary GI Doctor: Dr. Rosaline Coma  HPI: 73 year old female history of hypothyroidism, BPPV, PFO, tubular adenomas,history of polyps and diverticulosis noted on colonoscopy last year, DVT/PE LLE currently on apixaban  for less than a month (started 12/24/23 after ED diagnosis)  presents for evaluation of rectal bleeding.   Patient states in August she had a sinus infection and was given a 14-day course of Augmentin .  Midway through the course of Augmentin  she developed diarrhea and associated with her diarrhea she noticed a red jelly substance that would come out in the stool and on the tissue paper.  She does have history of internal hemorrhoids and has had banding in the past.   She came in for lab work 07/19/2023 with normal CBC and CMP.  Since she was having LLQ pain CT abdomen pelvis with contrast 07/19/2023 was obtained and showed diverticulosis without acute diverticulitis.   She continues to have 3-4 stools per day.  Occasional fecal incontinence with urinating.  Stools vary from watery to sand to sometimes pebbles.  She has associated LLQ pain that is relieved with a bowel movement.  She typically will see this red jelly substance with each bowel movement. Reports some nausea, no vomiting.  Denies unintentional weight loss. 08/09/23 Stool study via diatherix is negative for infections including C diff    08/27/2023 patient last seen in GI office with Dr. Rosaline Coma for hemorrhoid banding. At that time patient was still having diarrhea with 3-4 bowel movements per day for the last couple of months after her colonoscopy.  She does have some rectal bleeding and rectal pain.  Anusol  suppositories have not really helped.  Dr. Rosaline Coma recommended trial of Imodium 2 mg as needed to help with diarrhea. 01/11/2024 patient seen in ED for complaints of rectal bleeding. She has developed some abdominal pain, intermittent, waxing and waning, usually worse on the left side.  Hemoccult performed, but no stool in rectal vault. Patient has pictures of stool that show obvious bleeding, red blood. Hemoccult not sent due to poor sample. CT, no obvious infection. Stable renal cyst. I would recommend IVC filter eval. Can resume eliquis  (at least) low dose from tomorrow.  Labs show: Potassium 3.2, BUN 10, creatinine 0.89, normal LFTs, WBC 6.2, hemoglobin 14, platelets 419 01/19/2024 patient seen in ED for left leg pain.  Patient did states she fell off a curb a couple days ago.Bilateral LE US , which revealed DVT in L tibial and perioneal veins which is improved from prior US , no new clot burden visualized.  01/23/2024 patient seen by oncology to evaluate for elevated factor VIII level in the setting of acute blood clot and anticoagulation.  Recommend patient stay on Eliquis  5 mg twice daily per day for total of 6 months completing treatment at the end of August.  01/28/24 patient seen by myself in the office with main complaint of rectal bleeding and abdominal pain.  Upon examination it was noted she had liquid brown mixed with red stool with mucus.  Ordered flexible sigmoidoscopy. Flex sig on 4/1 showed - Diffuse inflammation was found in the rectum, in the sigmoid colon and in the descending colon. Biopsied.  - Diverticulosis in the sigmoid colon.  - Non- bleeding internal hemorrhoids. Path: showed that she has left-sided inflammation in her colon as a result of ulcerative colitis.  Patient started on Rowasa  enemas. 02/07/24 ED visit for generalized feelings of unwellness, nausea, left lower quadrant pain. CT abdomen pelvis demonstrates acute uncomplicated diverticulitis. Augmentin  for 7 days  given in ED and sent to pharmacy.   02/11/24 patient followed up with me in the office and at that time was doing well on Rowasa  enemas.  She was also completing Augmentin  for recent diagnosis of acute uncomplicated diverticulitis.  Patient had expressed some left lower quadrant abdominal  discomfort with nausea.  She was also being supplemented for low potassium. 02/28/2024 patient was having issues with enemas staying in therefore we offered switching to oral mesalamine .  Patient started mesalamine  4.8 g and within the first week patient started to experience worsening diarrhea, fever, headache, and poor appetite.  Instructed patient to discontinue mesalamine  and order placed for budesonide .  Unfortunately patient started to feel worse after conversation with severe abdominal pain and went to ED. 03/06/2024 patient presents to ED with complaints of abdominal pain with loose bloody stools and fever of 101.  Nausea without vomiting.CT does not show concerning process such as abscess, diverticulitis or free air. Diffuse colonic wall thickening compatible with colitis.   Labs show: Normal lipase, normal hemoglobin 13.7, WBC 10, magnesium  2.3, potassium 3.1 which was corrected.  Patient did not wish to be admitted and discharged home.  Patient received dose of Decadron .  Interval History    Patient presents for follow-up after ED visit yesterday, accompanied by her sister. She states since she received decadron  for the abdominal pain yesterday and it has significantly decreased. She states she has only passed small pellets today. She has small amount of blood with wiping two episodes. She still has poor appetite. Also notes some dizziness.  Wt Readings from Last 3 Encounters:  03/07/24 171 lb 6 oz (77.7 kg)  03/06/24 165 lb (74.8 kg)  02/11/24 170 lb 4 oz (77.2 kg)    Past Medical History:  Diagnosis Date   Abdominal pain, right lower quadrant    Acquired cyst of kidney 01/08/2012   pt denies history of    Acute sinusitis, unspecified    Adenomatous colon polyp    Allergy    Angiomyolipoma    kidney   Anxiety    OCCASIONAL   Cataract    Chest pain    Chest pain, unspecified    Depressive disorder, not elsewhere classified 01/08/2012   not currently    Diverticulosis    Dizziness  and giddiness    DVT (deep venous thrombosis) (HCC)    Dysfunction of eustachian tube    H/O Spinal surgery 02/20/2017   Hemorrhoids    History of diverticulitis of colon    History of fusion of cervical spine 04/25/2018   IBS (irritable bowel syndrome)    Liver lesion 2011   Neoplasm of skin of face    malignant   OA (osteoarthritis)    knee, NECK   Obesity, unspecified    Ostium secundum type atrial septal defect    Other and unspecified hyperlipidemia    Other malaise and fatigue    Palpitations    Pulmonary embolism (HCC)    Shortness of breath    Thyroid  disease    Unspecified essential hypertension    pt states she does not have HTN   Unspecified sinusitis (chronic)    Unspecified vitamin D  deficiency     Past Surgical History:  Procedure Laterality Date   ABDOMINAL HYSTERECTOMY     BREAST REDUCTION SURGERY  1990   CERVICAL DISC SURGERY     Dr.Nudleman   CHOLECYSTECTOMY     COLONOSCOPY     DILATION AND CURETTAGE OF UTERUS  EYE SURGERY Bilateral 07/30/2017   cataract sx   FLEXIBLE SIGMOIDOSCOPY  01/08/2012   Procedure: FLEXIBLE SIGMOIDOSCOPY;  Surgeon: Pietro Bridegroom, MD;  Location: [facility];  Service: Endoscopy;  Laterality: N/A;   HEMORRHOID SURGERY     KNEE ARTHROTOMY     pt states no    LAMINECTOMY N/A 01/31/2017   Procedure: Thoracic Laminectomy and marsupialization of arachnoid cyst;  Surgeon: Yvonna Herder, MD;  Location: [facility] OR;  Service: Neurosurgery;  Laterality: N/A;   LEFT OOPHORECTOMY  1976   NECK SURGERY N/A 03/2018   PARTIAL HYSTERECTOMY     right ovary remains   REDUCTION MAMMAPLASTY     TONSILLECTOMY  1972   UPPER GASTROINTESTINAL ENDOSCOPY      Current Outpatient Medications  Medication Sig Dispense Refill   acetaminophen  (TYLENOL ) 325 MG tablet Take 650 mg by mouth every 6 (six) hours as needed.     albuterol  (VENTOLIN  HFA) 108 (90 Base) MCG/ACT inhaler Inhale 2 puffs into the lungs every 6 (six) hours as needed for wheezing or  shortness of breath. 8 g 2   apixaban  (ELIQUIS ) 5 MG TABS tablet Take 1 tablet (5 mg total) by mouth 2 (two) times daily. 60 tablet 6   Budesonide  ER (UCERIS ) 9 MG TB24 Take 1 tablet (9 mg total) by mouth daily. (Patient not taking: Reported on 03/07/2024) 30 tablet 1   chlorpheniramine  (ALLERGY RELIEF) 4 MG tablet Take 1 tablet (4 mg total) by mouth 2 (two) times daily as needed for allergies.     Cholecalciferol  (VITAMIN D3) 2000 units TABS Take 2,000 Units by mouth daily.     Cyanocobalamin  (VITAMIN B 12 PO) Take by mouth.     ezetimibe  (ZETIA ) 10 MG tablet Take 1 tablet (10 mg total) by mouth daily. 90 tablet 3   fluticasone  (FLONASE ) 50 MCG/ACT nasal spray Place 1 spray into both nostrils as needed for allergies.      levothyroxine  (SYNTHROID ) 75 MCG tablet Take 1 tablet (75 mcg total) by mouth daily. 90 tablet 3   meclizine  (ANTIVERT ) 12.5 MG tablet Take 1 tablet (12.5 mg total) by mouth 3 (three) times daily as needed for dizziness. 30 tablet 0   ondansetron  (ZOFRAN -ODT) 4 MG disintegrating tablet Take 1 tablet (4 mg total) by mouth every 8 (eight) hours as needed for nausea or vomiting. (Patient not taking: Reported on 03/07/2024) 20 tablet 0   OVER THE COUNTER MEDICATION Sinus and pain, take PRN.     OVER THE COUNTER MEDICATION Pro-biotic one capsule daily.     pantoprazole  (PROTONIX ) 20 MG tablet TAKE 1 TABLET BY MOUTH EVERY DAY (Patient not taking: Reported on 03/07/2024) 90 tablet 1   potassium chloride  SA (KLOR-CON  M) 20 MEQ tablet TAKE 1 TABLET BY MOUTH TWICE A DAY 180 tablet 1   No current facility-administered medications for this visit.    Allergies as of 04/18/2024 - Review Complete 03/07/2024  Allergen Reaction Noted   Simvastatin Other (See Comments) 01/27/2011   Statins Other (See Comments) 05/14/2017   Bactrim [sulfamethoxazole -trimethoprim] Itching 11/12/2013   Sertraline  Anxiety 04/10/2019    Family History  Problem Relation Age of Onset   Hypertension Mother     Diabetes Mother    Cirrhosis Father    Heart disease Father    Hypertension Sister    Diabetes Sister    Heart disease Brother        x 2   Breast cancer Cousin 76 - 49   Diabetes Cousin  Diabetes Other        aunt   Stroke Other        aunt   Colon cancer Neg Hx    Esophageal cancer Neg Hx    Stomach cancer Neg Hx    Rectal cancer Neg Hx    Colon polyps Neg Hx     Review of Systems:    Constitutional: No weight loss, fever, chills, weakness or fatigue HEENT: Eyes: No change in vision               Ears, Nose, Throat:  No change in hearing or congestion Skin: No rash or itching Cardiovascular: No chest pain, chest pressure or palpitations   Respiratory: No SOB or cough Gastrointestinal: See HPI and otherwise negative Genitourinary: No dysuria or change in urinary frequency Neurological: No headache, dizziness or syncope Musculoskeletal: No new muscle or joint pain Hematologic: No bleeding or bruising Psychiatric: No history of depression or anxiety    Physical Exam:  Vital signs: There were no vitals taken for this visit.  Constitutional:   Pleasant  female appears to be in NAD, Well developed, Well nourished, alert and cooperative Throat: Oral cavity and pharynx without inflammation, swelling or lesion.  Respiratory: Respirations even and unlabored. Lungs clear to auscultation bilaterally.   No wheezes, crackles, or rhonchi.  Cardiovascular: Normal S1, S2. Regular rate and rhythm. No peripheral edema, cyanosis or pallor.  Gastrointestinal:  Soft, nondistended, nontender. No rebound or guarding. Normal bowel sounds. No appreciable masses or hepatomegaly. Rectal:  Not performed.  Msk:  Symmetrical without gross deformities. Without edema, no deformity or joint abnormality.  Neurologic:  Alert and  oriented x4;  grossly normal neurologically.  Skin:   Dry and intact without significant lesions or rashes. Psychiatric: Oriented to person, place and time. Demonstrates  good judgement and reason without abnormal affect or behaviors.  RELEVANT LABS AND IMAGING: CBC    Latest Ref Rng & Units 03/06/2024    5:17 PM 02/07/2024    7:02 PM 01/28/2024    2:48 PM  CBC  WBC 4.0 - 10.5 K/uL 10.0  7.1  7.2   Hemoglobin 12.0 - 15.0 g/dL 19.1  47.8  29.5   Hematocrit 36.0 - 46.0 % 43.3  39.3  41.9   Platelets 150 - 400 K/uL 411  408  415.0      CMP     Latest Ref Rng & Units 03/07/2024    2:42 PM 03/06/2024    5:17 PM 02/27/2024    2:27 PM  CMP  Glucose 70 - 99 mg/dL  621    BUN 8 - 23 mg/dL  10    Creatinine 3.08 - 1.00 mg/dL  6.57    Sodium 846 - 962 mmol/L  141    Potassium 3.5 - 5.1 mEq/L 3.8  3.1  3.1   Chloride 98 - 111 mmol/L  106    CO2 22 - 32 mmol/L  24    Calcium  8.9 - 10.3 mg/dL  9.3    Total Protein 6.5 - 8.1 g/dL  7.0    Total Bilirubin 0.0 - 1.2 mg/dL  0.6    Alkaline Phos 38 - 126 U/L  79    AST 15 - 41 U/L  17    ALT 0 - 44 U/L  14       Lab Results  Component Value Date   TSH 1.30 10/11/2023  03/11/2019 5 GI profile negative 03/10/2024 fecal calprotectin 3330 Previous GI workup: 01/11/24  CT Abd/pelvis IMPRESSION: 1. No acute inflammatory process identified within the abdomen or pelvis. 2. Multiple other nonacute observations, as described above. 02/07/24 CT abdomen pelvis with contrast IMPRESSION: 1. Mild acute uncomplicated diverticulitis of the sigmoid colon. No perforation, fluid collection, or abscess. 2.  Aortic Atherosclerosis (ICD10-I70.0).   Colonoscopy 05/08/2023 for personal history of colon polyps and positive fit test - The examined portion of the ileum was normal.  - Four 3 to 6 mm polyps (tubular adenomas) in the ascending colon and in the cecum, removed with a cold snare. Resected and retrieved.  - Diverticulosis in the sigmoid colon. - One 3 mm polyp (polyploid benign mucosa) in the sigmoid colon, removed with a cold snare. Resected and retrieved. - Non- bleeding external and internal hemorrhoids.  Path: Diagnosis 1.  Surgical [P], colon, ascending and cecum, polyp (4) - TUBULAR ADENOMA(S). - NO HIGH GRADE DYSPLASIA OR MALIGNANCY. - POLYPOID FRAGMENT OF BENIGN COLONIC MUCOSA WITH LYMPHOID AGGREGATE 2. Surgical [P], colon, sigmoid, polyp (1) - POLYPOID FRAGMENT OF BENIGN COLONIC MUCOSA WITH LYMPHOID AGGREGATE   Colonoscopy 06/16/2021 with Dr. Savannah Curlin for personal history of colon polyps - Hemorrhoids found on perianal exam.  - Non- bleeding internal hemorrhoids.  - Diverticulosis in the sigmoid colon and in the descending colon.  - One less than 1 mm polyp in the descending colon, removed with a cold biopsy forceps. Resected and retrieved.  - Three 2 to 3 mm polyps in the ascending colon, removed with a cold snare. Resected and retrieved.  - Two 1 to 2 mm polyps in the cecum, removed with a cold snare. Resected and retrieved.  - The examination was otherwise normal on direct and retroflexion views. - repeat 3 years Diagnosis 1. Surgical [P], colon, cecum, polyp (2) - TUBULAR ADENOMA WITHOUT HIGH-GRADE DYSPLASIA OR MALIGNANCY - OTHER FRAGMENT OF POLYPOID COLONIC MUCOSA WITH PROMINENT LYMPHOID AGGREGATES 2. Surgical [P], colon, ascending, polyp (3) - TUBULAR ADENOMA(S) WITHOUT HIGH-GRADE DYSPLASIA OR MALIGNANCY 3. Surgical [P], colon, descending, polyp (1) - BENIGN LEIOMYOMA  Assessment: No diagnosis found. 73 year old female patient with hx of Ulcerative colitis recently diagnosed on flexible sig. Patient initially started on Rowasa  suppositories, but unfortunately patient had issues with using the Rowasa  suppositories despite education on maneuvering them. We switched her to oral mesalamine  and she started to experience refractory diarrhea within the first week of starting medication, so I stopped the medication. Sent in Budesonide  9 mg po daily to pharmacy and received PA to start medication today. Patient went to ED yesterday for worsening abdominal pain and diarrhea with concerns for diverticulitis. CT  scab abd/pelvis negative for diverticulitis. Diffuse colonic wall thickening compatible with colitis. She is feeling a little better today, however not in taking a lot of food. Will recheck inflammatory markers. Will check stool GI profile with cdiff to r/o enteric infection. Will discuss case with Dr. Rosaline Coma when she returns, Talesha Ellithorpe need to consider other biological therapies.  Plan: -Start Budesonide  ER 9 mg po daily, PA approved.  -Stopped mesalamine  oral po daily - check CRP, ESR today -Check GI profile with cdiff - Check Fecal calprotectin  Thank you for the courtesy of this consult. Please call me with any questions or concerns.   Tiaja Hagan, FNP-C [facility] 04/17/2024, 4:52 PM  Cc: Felicita Horns, FNP

## 2024-04-18 ENCOUNTER — Ambulatory Visit: Admitting: Gastroenterology

## 2024-05-01 DIAGNOSIS — R197 Diarrhea, unspecified: Secondary | ICD-10-CM

## 2024-05-01 DIAGNOSIS — K51811 Other ulcerative colitis with rectal bleeding: Secondary | ICD-10-CM

## 2024-05-01 DIAGNOSIS — R1032 Left lower quadrant pain: Secondary | ICD-10-CM

## 2024-05-01 NOTE — Telephone Encounter (Signed)
 Called patient & symptoms started to return over the last week, similar to what she was experiencing prior to starting budesonide . She has intermittent, LLQ abd pain (7/10), small pellets/jelly of blood with bm's, and frequent, foul smelling diarrhea (3-5/day). Currently on 9 mg of budesonide . No recent abx use. States she's had no issues over the last 30 days since starting medication. She has OV scheduled with Deanna for 7/14 & she wanted her to be aware of symptoms prior to then.

## 2024-05-06 NOTE — Telephone Encounter (Addendum)
 Called and spoke with patient. Patient states that she has been on Budesonide  for about 8 weeks, only has a few pills left. Patient states that she had been doing well but for the past 2 weeks has been experiencing a change in bowel habits, bloody gel when wiping, and white mucous in the stool. Patient states that the consistency of her stools vary from being formed to soft pellets. Patient denies any new medications, herbal teas, or supplements when the symptoms started. Patient did not take any abx for sinus symptoms, she has only used Flonase . Patient states that she has been very fatigued, lack of energy and mood. Patient also reports intermittent abdominal pain that is not associated with her bowel movements. Patient has a daily probiotic soft gel at home that she will start. Patient denies any SOB or chest pain at this time. Please advise, thanks.

## 2024-05-06 NOTE — Telephone Encounter (Signed)
 Patient requesting a call at [phone] to discuss message below. States she is currently unable to access mychart. Please advise, thank you

## 2024-05-07 NOTE — Telephone Encounter (Signed)
 Orders in epic. Diatherix stool kit and collection instructions placed at 2nd floor front desk for pick up. MyChart message sent to patient with recommendations.

## 2024-05-08 ENCOUNTER — Other Ambulatory Visit

## 2024-05-08 DIAGNOSIS — R1032 Left lower quadrant pain: Secondary | ICD-10-CM | POA: Diagnosis not present

## 2024-05-08 DIAGNOSIS — K51811 Other ulcerative colitis with rectal bleeding: Secondary | ICD-10-CM | POA: Diagnosis not present

## 2024-05-08 DIAGNOSIS — R197 Diarrhea, unspecified: Secondary | ICD-10-CM | POA: Diagnosis not present

## 2024-05-08 LAB — VITAMIN D 25 HYDROXY (VIT D DEFICIENCY, FRACTURES): VITD: 37.47 ng/mL (ref 30.00–100.00)

## 2024-05-08 LAB — IBC + FERRITIN
Ferritin: 26.2 ng/mL (ref 10.0–291.0)
Iron: 82 ug/dL (ref 42–145)
Saturation Ratios: 21 % (ref 20.0–50.0)
TIBC: 390.6 ug/dL (ref 250.0–450.0)
Transferrin: 279 mg/dL (ref 212.0–360.0)

## 2024-05-08 LAB — CBC WITH DIFFERENTIAL/PLATELET
Basophils Absolute: 0 K/uL (ref 0.0–0.1)
Basophils Relative: 0.5 % (ref 0.0–3.0)
Eosinophils Absolute: 0 K/uL (ref 0.0–0.7)
Eosinophils Relative: 0.6 % (ref 0.0–5.0)
HCT: 41.1 % (ref 36.0–46.0)
Hemoglobin: 13.6 g/dL (ref 12.0–15.0)
Lymphocytes Relative: 27.9 % (ref 12.0–46.0)
Lymphs Abs: 1.7 K/uL (ref 0.7–4.0)
MCHC: 33 g/dL (ref 30.0–36.0)
MCV: 90.4 fl (ref 78.0–100.0)
Monocytes Absolute: 0.3 K/uL (ref 0.1–1.0)
Monocytes Relative: 5 % (ref 3.0–12.0)
Neutro Abs: 4.1 K/uL (ref 1.4–7.7)
Neutrophils Relative %: 66 % (ref 43.0–77.0)
Platelets: 430 K/uL — ABNORMAL HIGH (ref 150.0–400.0)
RBC: 4.55 Mil/uL (ref 3.87–5.11)
RDW: 13.8 % (ref 11.5–15.5)
WBC: 6.2 K/uL (ref 4.0–10.5)

## 2024-05-08 LAB — COMPREHENSIVE METABOLIC PANEL WITH GFR
ALT: 13 U/L (ref 0–35)
AST: 13 U/L (ref 0–37)
Albumin: 3.7 g/dL (ref 3.5–5.2)
Alkaline Phosphatase: 77 U/L (ref 39–117)
BUN: 13 mg/dL (ref 6–23)
CO2: 31 meq/L (ref 19–32)
Calcium: 9.1 mg/dL (ref 8.4–10.5)
Chloride: 102 meq/L (ref 96–112)
Creatinine, Ser: 0.97 mg/dL (ref 0.40–1.20)
GFR: 57.69 mL/min — ABNORMAL LOW (ref >60.00)
Glucose, Bld: 143 mg/dL — ABNORMAL HIGH (ref 70–99)
Potassium: 3.3 meq/L — ABNORMAL LOW (ref 3.5–5.1)
Sodium: 140 meq/L (ref 135–145)
Total Bilirubin: 0.5 mg/dL (ref 0.2–1.2)
Total Protein: 6.6 g/dL (ref 6.0–8.3)

## 2024-05-08 LAB — FOLATE: Folate: 21.4 ng/mL (ref >5.9)

## 2024-05-08 LAB — VITAMIN B12: Vitamin B-12: 312 pg/mL (ref 211–911)

## 2024-05-08 LAB — C-REACTIVE PROTEIN: CRP: <1 mg/dL (ref 0.5–20.0)

## 2024-05-09 ENCOUNTER — Other Ambulatory Visit

## 2024-05-09 DIAGNOSIS — R197 Diarrhea, unspecified: Secondary | ICD-10-CM | POA: Diagnosis not present

## 2024-05-09 DIAGNOSIS — K51811 Other ulcerative colitis with rectal bleeding: Secondary | ICD-10-CM

## 2024-05-09 DIAGNOSIS — R1032 Left lower quadrant pain: Secondary | ICD-10-CM | POA: Diagnosis not present

## 2024-05-12 ENCOUNTER — Encounter: Payer: Self-pay | Admitting: Gastroenterology

## 2024-05-12 ENCOUNTER — Ambulatory Visit: Admitting: Gastroenterology

## 2024-05-12 ENCOUNTER — Other Ambulatory Visit (INDEPENDENT_AMBULATORY_CARE_PROVIDER_SITE_OTHER)

## 2024-05-12 ENCOUNTER — Telehealth: Payer: Self-pay

## 2024-05-12 VITALS — BP 124/70 | HR 81 | Ht 65.0 in | Wt 173.0 lb

## 2024-05-12 DIAGNOSIS — K51811 Other ulcerative colitis with rectal bleeding: Secondary | ICD-10-CM | POA: Diagnosis not present

## 2024-05-12 DIAGNOSIS — E538 Deficiency of other specified B group vitamins: Secondary | ICD-10-CM

## 2024-05-12 DIAGNOSIS — E559 Vitamin D deficiency, unspecified: Secondary | ICD-10-CM

## 2024-05-12 MED ORDER — PREDNISONE 10 MG PO TABS: ORAL_TABLET | ORAL | 0 refills | Status: AC

## 2024-05-12 NOTE — Telephone Encounter (Signed)
 Info was sent to [facility] with  [facility] to assist.

## 2024-05-12 NOTE — Telephone Encounter (Signed)
-----   Message from Cathryne PARAS May sent at 05/12/2024  1:01 PM EDT ----- Regarding: biologic therapy Pod B-  Need to find out what biological therapy covered for ulcerative colitis. Failed- mesalamine  oral, Rowasa  suppositories, and budesonide  9mg  po daily  History of DVT cannot have JAK inhibitor  Deanna. NP

## 2024-05-12 NOTE — Telephone Encounter (Signed)
 Please see email below sent from [facility] at [facility] [place].     Hi! Please see the message below from my PA nurse who ran the test claim.  Avelina Buff,  Tremfya would be the choice for her.     We would be more than happy to provide Rx for this pt.         Looks like Tremfya is preferred - it's the only one that paid. None from the list you provided from [facility] paid, so not on formulary for diagnosis; Entyvio, Rinvoq, Remicade and Omvoh.         Let me know what is decided. I can come pick up referral information tomorrow or whenever ready if that's easier for you.        With Kind Regards,     Delon Bohr  Clinical Sales Liaison  [facility]  Mobile: [phone]  Office: [phone]  Fax: [phone]  Email: [email or web address]  Website: [email or web address]

## 2024-05-12 NOTE — Progress Notes (Signed)
 Chief Complaint:follow-up UC Primary GI Doctor: Dr. Federico  HPI:   73 year old female history of hypothyroidism, BPPV, PFO, tubular adenomas,history of polyps and diverticulosis noted on colonoscopy last year, DVT/PE LLE currently on apixaban  for less than a month (started 12/24/23 after ED diagnosis) presents for follow-up.    Patient states in August she had a sinus infection and was given a 14-day course of Augmentin .  Midway through the course of Augmentin  she developed diarrhea and associated with her diarrhea she noticed a red jelly substance that would come out in the stool and on the tissue paper.  She does have history of internal hemorrhoids and has had banding in the past.   She came in for lab work 07/19/2023 with normal CBC and CMP.  Since she was having LLQ pain CT abdomen pelvis with contrast 07/19/2023 was obtained and showed diverticulosis without acute diverticulitis.   She continued to have 3-4 stools per day.  Occasional fecal incontinence with urinating.  Stools vary from watery to sand to sometimes pebbles.  She has associated LLQ pain that is relieved with a bowel movement.  She typically will see this red jelly substance with each bowel movement. Reports some nausea, no vomiting.  Denies unintentional weight loss. 08/09/23 Stool study via diatherix is negative for infections including C diff    08/27/2023 patient last seen in GI office with Dr. Federico for hemorrhoid banding. At that time patient was still having diarrhea with 3-4 bowel movements per day for the last couple of months after her colonoscopy.  She does have some rectal bleeding and rectal pain.  Anusol  suppositories have not really helped.  Dr. Federico recommended trial of Imodium 2 mg as needed to help with diarrhea. 01/11/2024 patient seen in ED for complaints of rectal bleeding. She has developed some abdominal pain, intermittent, waxing and waning, usually worse on the left side. Hemoccult performed, but  no stool in rectal vault. Patient has pictures of stool that show obvious bleeding, red blood. Hemoccult not sent due to poor sample. CT, no obvious infection. Stable renal cyst. I would recommend IVC filter eval. Can resume eliquis  (at least) low dose from tomorrow.  Labs show: Potassium 3.2, BUN 10, creatinine 0.89, normal LFTs, WBC 6.2, hemoglobin 14, platelets 419 01/19/2024 patient seen in ED for left leg pain.  Patient did states she fell off a curb a couple days ago.Bilateral LE US , which revealed DVT in L tibial and perioneal veins which is improved from prior US , no new clot burden visualized.  01/23/2024 patient seen by oncology to evaluate for elevated factor VIII level in the setting of acute blood clot and anticoagulation.  Recommend patient stay on Eliquis  5 mg twice daily per day for total of 6 months completing treatment at the end of August.  01/28/24 patient seen by myself in the office with main complaint of rectal bleeding and abdominal pain.  Upon examination it was noted she had liquid brown mixed with red stool with mucus.  Ordered flexible sigmoidoscopy. Flex sig on 4/1 showed - Diffuse inflammation was found in the rectum, in the sigmoid colon and in the descending colon. Biopsied.  - Diverticulosis in the sigmoid colon.  - Non- bleeding internal hemorrhoids. Path: showed that she has left-sided inflammation in her colon as a result of ulcerative colitis.  Patient started on Rowasa  enemas. 02/07/24 ED visit for generalized feelings of unwellness, nausea, left lower quadrant pain. CT abdomen pelvis demonstrates acute uncomplicated diverticulitis. Augmentin  for 7 days given  in ED and sent to pharmacy.   02/11/24 patient followed up with me in the office and at that time was doing well on Rowasa  enemas.  She was also completing Augmentin  for recent diagnosis of acute uncomplicated diverticulitis.  Patient had expressed some left lower quadrant abdominal discomfort with nausea.  She was  also being supplemented for low potassium. 02/28/2024 patient was having issues with enemas staying in therefore we offered switching to oral mesalamine .  Patient started mesalamine  4.8 g and within the first week patient started to experience worsening diarrhea, fever, headache, and poor appetite.  Instructed patient to discontinue mesalamine  and order placed for budesonide . Unfortunately patient started to feel worse after conversation with severe abdominal pain and went to ED. 03/06/2024 patient presents to ED with complaints of abdominal pain with loose bloody stools and fever of 101.  Nausea without vomiting.CT does not show concerning process such as abscess, diverticulitis or free air. Diffuse colonic wall thickening compatible with colitis.   Labs show: Normal lipase, normal hemoglobin 13.7, WBC 10, magnesium  2.3, potassium 3.1 which was corrected.  Patient did not wish to be admitted and discharged home.  Patient received dose of Decadron .  Interval History    Patient presents for follow-up on Ulcerative colitis. Patient started on Budesonide  beginning of Sylvanus Telford and completed 8 weeks.She reports the first 6 weeks she did well, but the last 2 weeks of finishing the medication she started had bloody mucous stools. She will have some formed stools and some loose stools. She will pass stool every time she urinates. She has on average 4-6 stools per day. She has intermittent lower abdominal discomfort. The appetite has improved. She reports the dizziness has improved some but she still has it occasionally.  Patient taking OTC Vitamin D  5,000 units every other day.   UTC on Pneumonia vaccine. She reports she does not get flu vaccine.  Wt Readings from Last 3 Encounters:  05/12/24 173 lb (78.5 kg)  03/07/24 171 lb 6 oz (77.7 kg)  03/06/24 165 lb (74.8 kg)    Past Medical History:  Diagnosis Date   Abdominal pain, right lower quadrant    Acquired cyst of kidney 01/08/2012   pt denies history of     Acute sinusitis, unspecified    Adenomatous colon polyp    Allergy    Angiomyolipoma    kidney   Anxiety    OCCASIONAL   Cataract    Chest pain    Chest pain, unspecified    Depressive disorder, not elsewhere classified 01/08/2012   not currently    Diverticulosis    Dizziness and giddiness    DVT (deep venous thrombosis) (HCC)    Dysfunction of eustachian tube    H/O Spinal surgery 02/20/2017   Hemorrhoids    History of diverticulitis of colon    History of fusion of cervical spine 04/25/2018   IBS (irritable bowel syndrome)    Liver lesion 2011   Neoplasm of skin of face    malignant   OA (osteoarthritis)    knee, NECK   Obesity, unspecified    Ostium secundum type atrial septal defect    Other and unspecified hyperlipidemia    Other malaise and fatigue    Palpitations    Pulmonary embolism (HCC)    Shortness of breath    Thyroid  disease    Ulcerative colitis (HCC)    Unspecified essential hypertension    pt states she does not have HTN   Unspecified sinusitis (chronic)  Unspecified vitamin D  deficiency     Past Surgical History:  Procedure Laterality Date   ABDOMINAL HYSTERECTOMY     BREAST REDUCTION SURGERY  1990   CERVICAL DISC SURGERY     Dr.Nudleman   CHOLECYSTECTOMY     COLONOSCOPY     DILATION AND CURETTAGE OF UTERUS     EYE SURGERY Bilateral 07/30/2017   cataract sx   FLEXIBLE SIGMOIDOSCOPY  01/08/2012   Procedure: FLEXIBLE SIGMOIDOSCOPY;  Surgeon: Princella CHRISTELLA Nida, MD;  Location: [facility];  Service: Endoscopy;  Laterality: N/A;   HEMORRHOID SURGERY     KNEE ARTHROTOMY     pt states no    LAMINECTOMY N/A 01/31/2017   Procedure: Thoracic Laminectomy and marsupialization of arachnoid cyst;  Surgeon: Lamar Peaches, MD;  Location: [facility] OR;  Service: Neurosurgery;  Laterality: N/A;   LEFT OOPHORECTOMY  1976   NECK SURGERY N/A 03/2018   PARTIAL HYSTERECTOMY     right ovary remains   REDUCTION MAMMAPLASTY     TONSILLECTOMY  1972   UPPER  GASTROINTESTINAL ENDOSCOPY      Current Outpatient Medications  Medication Sig Dispense Refill   acetaminophen  (TYLENOL ) 325 MG tablet Take 650 mg by mouth every 6 (six) hours as needed.     albuterol  (VENTOLIN  HFA) 108 (90 Base) MCG/ACT inhaler Inhale 2 puffs into the lungs every 6 (six) hours as needed for wheezing or shortness of breath. 8 g 2   apixaban  (ELIQUIS ) 5 MG TABS tablet Take 1 tablet (5 mg total) by mouth 2 (two) times daily. 60 tablet 6   chlorpheniramine  (ALLERGY RELIEF) 4 MG tablet Take 1 tablet (4 mg total) by mouth 2 (two) times daily as needed for allergies.     Cholecalciferol  (VITAMIN D3) 2000 units TABS Take 2,000 Units by mouth daily.     ezetimibe  (ZETIA ) 10 MG tablet Take 1 tablet (10 mg total) by mouth daily. 90 tablet 3   fluticasone  (FLONASE ) 50 MCG/ACT nasal spray Place 1 spray into both nostrils as needed for allergies.      levothyroxine  (SYNTHROID ) 75 MCG tablet Take 1 tablet (75 mcg total) by mouth daily. 90 tablet 3   meclizine  (ANTIVERT ) 12.5 MG tablet Take 1 tablet (12.5 mg total) by mouth 3 (three) times daily as needed for dizziness. 30 tablet 0   ondansetron  (ZOFRAN -ODT) 4 MG disintegrating tablet Take 1 tablet (4 mg total) by mouth every 8 (eight) hours as needed for nausea or vomiting. 20 tablet 0   OVER THE COUNTER MEDICATION Sinus and pain, take PRN.     Budesonide  ER (UCERIS ) 9 MG TB24 Take 1 tablet (9 mg total) by mouth daily. (Patient not taking: Reported on 05/12/2024) 30 tablet 1   OVER THE COUNTER MEDICATION Pro-biotic one capsule daily. (Patient not taking: Reported on 05/12/2024)     No current facility-administered medications for this visit.    Allergies as of 05/12/2024 - Review Complete 05/12/2024  Allergen Reaction Noted   Simvastatin Other (See Comments) 01/27/2011   Statins Other (See Comments) 05/14/2017   Bactrim [sulfamethoxazole -trimethoprim] Itching 11/12/2013   Sertraline  Anxiety 04/10/2019    Family History  Problem  Relation Age of Onset   Hypertension Mother    Diabetes Mother    Cirrhosis Father    Heart disease Father    Hypertension Sister    Diabetes Sister    Heart disease Brother        x 2   Breast cancer Cousin 33 - 78   Diabetes Cousin  Diabetes Other        aunt   Stroke Other        aunt   Other Son        brain tumor, age 69   Colon cancer Neg Hx    Esophageal cancer Neg Hx    Stomach cancer Neg Hx    Rectal cancer Neg Hx    Colon polyps Neg Hx     Review of Systems:    Constitutional: No weight loss, fever, chills, weakness or fatigue HEENT: Eyes: No change in vision               Ears, Nose, Throat:  No change in hearing or congestion Skin: No rash or itching Cardiovascular: No chest pain, chest pressure or palpitations   Respiratory: No SOB or cough Gastrointestinal: See HPI and otherwise negative Genitourinary: No dysuria or change in urinary frequency Neurological: No headache, dizziness or syncope Musculoskeletal: No new muscle or joint pain Hematologic: No bleeding or bruising Psychiatric: No history of depression or anxiety    Physical Exam:  Vital signs: BP 124/70   Pulse 81   Ht 5' 5 (1.651 m)   Wt 173 lb (78.5 kg)   BMI 28.79 kg/m   Constitutional:   Pleasant  female appears to be in NAD, Well developed, Well nourished, alert and cooperative Throat: Oral cavity and pharynx without inflammation, swelling or lesion.  Respiratory: Respirations even and unlabored. Lungs clear to auscultation bilaterally.   No wheezes, crackles, or rhonchi.  Cardiovascular: Normal S1, S2. Regular rate and rhythm. No peripheral edema, cyanosis or pallor.  Gastrointestinal:  Soft, nondistended, nontender. No rebound or guarding. Normal bowel sounds. No appreciable masses or hepatomegaly. Rectal:  Not performed.  Msk:  Symmetrical without gross deformities. Without edema, no deformity or joint abnormality.  Neurologic:  Alert and  oriented x4;  grossly normal  neurologically.  Skin:   Dry and intact without significant lesions or rashes. Psychiatric: Oriented to person, place and time. Demonstrates good judgement and reason without abnormal affect or behaviors.  RELEVANT LABS AND IMAGING: CBC    Latest Ref Rng & Units 05/08/2024    3:51 PM 03/06/2024    5:17 PM 02/07/2024    7:02 PM  CBC  WBC 4.0 - 10.5 K/uL 6.2  10.0  7.1   Hemoglobin 12.0 - 15.0 g/dL 86.3  86.2  86.9   Hematocrit 36.0 - 46.0 % 41.1  43.3  39.3   Platelets 150.0 - 400.0 K/uL 430.0  411  408      CMP     Latest Ref Rng & Units 05/08/2024    3:51 PM 03/07/2024    2:42 PM 03/06/2024    5:17 PM  CMP  Glucose 70 - 99 mg/dL 856   890   BUN 6 - 23 mg/dL 13   10   Creatinine 9.59 - 1.20 mg/dL 9.02   8.91   Sodium 864 - 145 mEq/L 140   141   Potassium 3.5 - 5.1 mEq/L 3.3  3.8  3.1   Chloride 96 - 112 mEq/L 102   106   CO2 19 - 32 mEq/L 31   24   Calcium  8.4 - 10.5 mg/dL 9.1   9.3   Total Protein 6.0 - 8.3 g/dL 6.6   7.0   Total Bilirubin 0.2 - 1.2 mg/dL 0.5   0.6   Alkaline Phos 39 - 117 U/L 77   79   AST 0 - 37 U/L  13   17   ALT 0 - 35 U/L 13   14      Lab Results  Component Value Date   TSH 1.30 10/11/2023  05/08/24 labs show:folate 21.4, potassium 3.3, CRP <1, B 12 312, vitamin D  37.47, iron 82, ferritin 26.2 03/10/24 GI profile negative 03/10/24 fecal calprotectin  3,330  Previous GI workup:  Imaging: 01/11/24 CT Abd/pelvis IMPRESSION: 1. No acute inflammatory process identified within the abdomen or pelvis. 2. Multiple other nonacute observations, as described above. 02/07/24 CT abdomen pelvis with contrast IMPRESSION: 1. Mild acute uncomplicated diverticulitis of the sigmoid colon. No perforation, fluid collection, or abscess. 2.  Aortic Atherosclerosis (ICD10-I70.0).  Procedures:  Colonoscopy 05/08/2023 for personal history of colon polyps and positive fit test - The examined portion of the ileum was normal.  - Four 3 to 6 mm polyps (tubular adenomas) in the  ascending colon and in the cecum, removed with a cold snare. Resected and retrieved.  - Diverticulosis in the sigmoid colon. - One 3 mm polyp (polyploid benign mucosa) in the sigmoid colon, removed with a cold snare. Resected and retrieved. - Non- bleeding external and internal hemorrhoids.  Path: Diagnosis 1. Surgical [P], colon, ascending and cecum, polyp (4) - TUBULAR ADENOMA(S). - NO HIGH GRADE DYSPLASIA OR MALIGNANCY. - POLYPOID FRAGMENT OF BENIGN COLONIC MUCOSA WITH LYMPHOID AGGREGATE 2. Surgical [P], colon, sigmoid, polyp (1) - POLYPOID FRAGMENT OF BENIGN COLONIC MUCOSA WITH LYMPHOID AGGREGATE   Colonoscopy 06/16/2021 with Dr. Eda for personal history of colon polyps - Hemorrhoids found on perianal exam.  - Non- bleeding internal hemorrhoids.  - Diverticulosis in the sigmoid colon and in the descending colon.  - One less than 1 mm polyp in the descending colon, removed with a cold biopsy forceps. Resected and retrieved.  - Three 2 to 3 mm polyps in the ascending colon, removed with a cold snare. Resected and retrieved.  - Two 1 to 2 mm polyps in the cecum, removed with a cold snare. Resected and retrieved.  - The examination was otherwise normal on direct and retroflexion views. - repeat 3 years Diagnosis 1. Surgical [P], colon, cecum, polyp (2) - TUBULAR ADENOMA WITHOUT HIGH-GRADE DYSPLASIA OR MALIGNANCY - OTHER FRAGMENT OF POLYPOID COLONIC MUCOSA WITH PROMINENT LYMPHOID AGGREGATES 2. Surgical [P], colon, ascending, polyp (3) - TUBULAR ADENOMA(S) WITHOUT HIGH-GRADE DYSPLASIA OR MALIGNANCY 3. Surgical [P], colon, descending, polyp (1) - BENIGN LEIOMYOMA  Assessment: Encounter Diagnoses  Name Primary?   Other ulcerative colitis with rectal bleeding (HCC) Yes   Vitamin D  deficiency    B12 deficiency       73 year old female patient with newly diagnosed Ulcerative colitis recently diagnosed on flexible sig April 2025.  Patient initially started on Rowasa  suppositories, but  unfortunately patient had issues with using the Rowasa  suppositories despite education on maneuvering them. We switched her to oral mesalamine  and she started to experience refractory diarrhea within the first week of starting medication, so I stopped the medication. Patient placed on 2 months of Budesonide  which helped initially first 6 weeks, then symptoms started reoccurring last 2 weeks of finishing medication. Pt pending fecal calprotectin. We spent few minutes today discussing biological therapy. Will need to get labwork prior to initiating, TB Gold, Hepatitis B, and TMPT. Discussed case with Dr. Federico will start bridge therapy with steroid taper. History of DVT not candidate for Jak Inhibitor.  Plan: -Check TB Gold Quant, Hepatitis B, TPMT to start initiation of biological therapy -Start steroid taper for  bridge therapy -Continue vitamin D  5,000 units OTC -Continue vitamin b 12  - Pending Fecal calprotectin  Thank you for the courtesy of this consult. Please call me with any questions or concerns.   Austin Herd, FNP-C [facility] 05/12/2024, 11:21 AM  Cc: Corwin Antu, FNP

## 2024-05-12 NOTE — Patient Instructions (Signed)
 Your provider has requested that you go to the basement level for lab work before leaving today. Press B on the elevator. The lab is located at the first door on the left as you exit the elevator.  _______________________________________________________  If your blood pressure at your visit was 140/90 or greater, please contact your primary care physician to follow up on this.  _______________________________________________________  If you are age 74 or older, your body mass index should be between 23-30. Your Body mass index is 28.79 kg/m. If this is out of the aforementioned range listed, please consider follow up with your Primary Care Provider.  If you are age 69 or younger, your body mass index should be between 19-25. Your Body mass index is 28.79 kg/m. If this is out of the aformentioned range listed, please consider follow up with your Primary Care Provider.   ________________________________________________________  The  [facility] providers would like to encourage you to use MYCHART to communicate with providers for non-urgent requests or questions.  Due to long hold times on the telephone, sending your provider a message by [facility] may be a faster and more efficient way to get a response.  Please allow 48 business hours for a response.  Please remember that this is for non-urgent requests.  _______________________________________________________  Thank you for trusting me with your gastrointestinal care. Deanna May, RNP

## 2024-05-13 ENCOUNTER — Ambulatory Visit: Payer: Self-pay | Admitting: Family

## 2024-05-13 LAB — CALPROTECTIN, FECAL: Calprotectin, Fecal: 4080 ug/g — ABNORMAL HIGH (ref 0–120)

## 2024-05-13 NOTE — Progress Notes (Signed)
 I agree with the assessment and plan as outlined by Nicole Daniels. This is a 73 year old patient with recently diagnosed left sided UC based upon colonoscopy that did not respond well to mesalamine  enemas. She had a paradoxical response to oral mesalamine  with worsening symptoms. She had a response to higher doses of budesonide  but then subsequently lost this response as she started to taper. Will start her on a slow prednisone  taper to try to get her symptoms under better control. Per patient's insurance, Nicole Daniels is the only biologic that would be covered so will attempt to initiate this. I do think that Entyvio Daniels be better overall therapy for her, but it appears that we are being limited by her insurance coverage.

## 2024-05-14 ENCOUNTER — Ambulatory Visit: Payer: Self-pay | Admitting: Gastroenterology

## 2024-05-14 ENCOUNTER — Telehealth: Payer: Self-pay | Admitting: Gastroenterology

## 2024-05-14 LAB — QUANTIFERON-TB GOLD PLUS
Mitogen-NIL: >10 [IU]/mL
NIL: 0.02 [IU]/mL
QuantiFERON-TB Gold Plus: NEGATIVE
TB1-NIL: 0 [IU]/mL
TB2-NIL: 0.01 [IU]/mL

## 2024-05-14 LAB — HEPATITIS B SURFACE ANTIGEN: Hepatitis B Surface Ag: NONREACTIVE

## 2024-05-14 LAB — HEPATITIS B SURFACE ANTIBODY,QUALITATIVE: Hep B S Ab: NONREACTIVE

## 2024-05-14 NOTE — Telephone Encounter (Signed)
 Pt made aware of providers recommendations. Pt was given the option of [facility] vs home infusion with  [facility]. Pt did request for the infusions to be at home. Order was signed by provider and records were printed.Delon from [facility] to pick up Orders and records tomorrow morning.

## 2024-05-14 NOTE — Telephone Encounter (Signed)
 Spoke with patient about her stool tests results, GI profile negative. Fecal calprotectin is 3330> 4080. We  discussed the recommendations for Tremfya by insurance. She had several questions and concerns and I spent several minutes discussing this with her. She has started prednisone . She is ok to proceed in tremfya.

## 2024-05-15 NOTE — Telephone Encounter (Signed)
 Referral has been given to [facility] with [facility].

## 2024-05-16 ENCOUNTER — Encounter: Payer: Self-pay | Admitting: Advanced Practice Midwife

## 2024-05-19 DIAGNOSIS — Z23 Encounter for immunization: Secondary | ICD-10-CM

## 2024-05-19 NOTE — Telephone Encounter (Signed)
 Patient scheduled 7-24 and 8-27 for the hep b vaccine series over the phone

## 2024-05-22 ENCOUNTER — Ambulatory Visit (INDEPENDENT_AMBULATORY_CARE_PROVIDER_SITE_OTHER)

## 2024-05-22 DIAGNOSIS — Z23 Encounter for immunization: Secondary | ICD-10-CM

## 2024-05-24 ENCOUNTER — Other Ambulatory Visit: Payer: Self-pay | Admitting: Gastroenterology

## 2024-05-26 ENCOUNTER — Telehealth: Payer: Self-pay

## 2024-05-26 NOTE — Telephone Encounter (Signed)
 Pt made aware of Dr. Lorenso Courier recommendations.  Pt verbalized understanding with all questions answered.

## 2024-05-26 NOTE — Telephone Encounter (Signed)
 Pt stated that she is to be scheduled for her first Tremfya Infusion on 06/05/2024 in home through [facility]. Pt stated that she is currently on the prednisone  taper and unsure of how to take the medications once staring the infusion. Pt currently on 40 mg currently and to taper down to 30 mg on Wednesday 05/28/2024. Please review and advise

## 2024-06-03 ENCOUNTER — Other Ambulatory Visit: Payer: Medicare PPO

## 2024-06-05 DIAGNOSIS — K5190 Ulcerative colitis, unspecified, without complications: Secondary | ICD-10-CM | POA: Diagnosis not present

## 2024-06-07 ENCOUNTER — Encounter (HOSPITAL_BASED_OUTPATIENT_CLINIC_OR_DEPARTMENT_OTHER): Payer: Self-pay

## 2024-06-07 ENCOUNTER — Emergency Department (HOSPITAL_BASED_OUTPATIENT_CLINIC_OR_DEPARTMENT_OTHER)
Admission: EM | Admit: 2024-06-07 | Discharge: 2024-06-07 | Disposition: A | Discharge status: Home / Self Care | Source: Ambulatory Visit | Attending: Emergency Medicine | Admitting: Emergency Medicine

## 2024-06-07 ENCOUNTER — Telehealth: Payer: Self-pay | Admitting: Internal Medicine

## 2024-06-07 ENCOUNTER — Other Ambulatory Visit: Payer: Self-pay

## 2024-06-07 DIAGNOSIS — T451X5A Adverse effect of antineoplastic and immunosuppressive drugs, initial encounter: Secondary | ICD-10-CM | POA: Diagnosis not present

## 2024-06-07 DIAGNOSIS — T7840XA Allergy, unspecified, initial encounter: Secondary | ICD-10-CM | POA: Diagnosis present

## 2024-06-07 DIAGNOSIS — L299 Pruritus, unspecified: Secondary | ICD-10-CM | POA: Diagnosis not present

## 2024-06-07 DIAGNOSIS — I10 Essential (primary) hypertension: Secondary | ICD-10-CM | POA: Diagnosis not present

## 2024-06-07 DIAGNOSIS — T50905A Adverse effect of unspecified drugs, medicaments and biological substances, initial encounter: Secondary | ICD-10-CM | POA: Insufficient documentation

## 2024-06-07 DIAGNOSIS — E876 Hypokalemia: Secondary | ICD-10-CM | POA: Insufficient documentation

## 2024-06-07 DIAGNOSIS — Z7901 Long term (current) use of anticoagulants: Secondary | ICD-10-CM | POA: Diagnosis not present

## 2024-06-07 LAB — COMPREHENSIVE METABOLIC PANEL WITH GFR
ALT: 15 U/L (ref 0–44)
AST: 16 U/L (ref 15–41)
Albumin: 3.7 g/dL (ref 3.5–5.0)
Alkaline Phosphatase: 71 U/L (ref 38–126)
Anion gap: 10 (ref 5–15)
BUN: 13 mg/dL (ref 8–23)
CO2: 27 mmol/L (ref 22–32)
Calcium: 9.3 mg/dL (ref 8.9–10.3)
Chloride: 105 mmol/L (ref 98–111)
Creatinine, Ser: 0.93 mg/dL (ref 0.44–1.00)
GFR, Estimated: >60 mL/min (ref >60)
Glucose, Bld: 109 mg/dL — ABNORMAL HIGH (ref 70–99)
Potassium: 3.1 mmol/L — ABNORMAL LOW (ref 3.5–5.1)
Sodium: 142 mmol/L (ref 135–145)
Total Bilirubin: 0.6 mg/dL (ref 0.0–1.2)
Total Protein: 5.9 g/dL — ABNORMAL LOW (ref 6.5–8.1)

## 2024-06-07 LAB — CBC WITH DIFFERENTIAL/PLATELET
Abs Immature Granulocytes: 0.04 K/uL (ref 0.00–0.07)
Basophils Absolute: 0 K/uL (ref 0.0–0.1)
Basophils Relative: 0 %
Eosinophils Absolute: 0.1 K/uL (ref 0.0–0.5)
Eosinophils Relative: 1 %
HCT: 39.8 % (ref 36.0–46.0)
Hemoglobin: 13.1 g/dL (ref 12.0–15.0)
Immature Granulocytes: 1 %
Lymphocytes Relative: 41 %
Lymphs Abs: 3.3 K/uL (ref 0.7–4.0)
MCH: 30.1 pg (ref 26.0–34.0)
MCHC: 32.9 g/dL (ref 30.0–36.0)
MCV: 91.5 fL (ref 80.0–100.0)
Monocytes Absolute: 0.6 K/uL (ref 0.1–1.0)
Monocytes Relative: 7 %
Neutro Abs: 4 K/uL (ref 1.7–7.7)
Neutrophils Relative %: 50 %
Platelets: 340 K/uL (ref 150–400)
RBC: 4.35 MIL/uL (ref 3.87–5.11)
RDW: 14.1 % (ref 11.5–15.5)
WBC: 8 K/uL (ref 4.0–10.5)
nRBC: 0 % (ref 0.0–0.2)

## 2024-06-07 MED ORDER — DIPHENHYDRAMINE HCL 50 MG/ML IJ SOLN
12.5000 mg | Freq: Once | INTRAMUSCULAR | Status: AC
  Administered 2024-06-07: 12.5 mg via INTRAVENOUS
  Filled 2024-06-07: qty 1

## 2024-06-07 MED ORDER — PREDNISONE 50 MG PO TABS
60.0000 mg | ORAL_TABLET | Freq: Once | ORAL | Status: AC
  Administered 2024-06-07: 60 mg via ORAL
  Filled 2024-06-07: qty 1

## 2024-06-07 MED ORDER — FAMOTIDINE 20 MG PO TABS
20.0000 mg | ORAL_TABLET | Freq: Once | ORAL | Status: AC
  Administered 2024-06-07: 20 mg via ORAL
  Filled 2024-06-07: qty 1

## 2024-06-07 NOTE — ED Notes (Signed)
 Patient given discharge instructions. Questions were answered. Patient verbalized understanding of discharge instructions and care at home.

## 2024-06-07 NOTE — ED Provider Notes (Signed)
 [place] EMERGENCY DEPARTMENT AT [facility] Provider Note   CSN: [id number] Arrival date & time: 06/07/24  9092     Patient presents with: Allergic Reaction   Nicole Daniels is a 73 y.o. female.   73 year old female sent to the ER by Dr. Lovenia for possible allergic reaction to Tremfya, first dose, taken for colitis. Patient had infusion at home 36 hours ago, took Benadryl  and Pepcid  at that time. Yesterday felt a little nauseous but was able to eat without difficulty or change in appetite, also noted slightly loose stool yesterday. Woke up today feeling like her face was swollen, tongue was tingling, noted some change to her breathing and feeling jittery. Patient called her GI provider and was directed to the ER. Patient is currently on 20mg  Prednisone  daily, tapering off of prednisone , has not taken her dose today. Has not taken any other medications prior to arrival. She denies any rash, has baseline itching to her hands and feet that she manages with lotion. No difficulty breathing or swallowing.        Prior to Admission medications   Medication Sig Start Date End Date Taking? Authorizing Provider  acetaminophen  (TYLENOL ) 325 MG tablet Take 650 mg by mouth every 6 (six) hours as needed.    [provider]  albuterol  (VENTOLIN  HFA) 108 (90 Base) MCG/ACT inhaler Inhale 2 puffs into the lungs every 6 (six) hours as needed for wheezing or shortness of breath. 12/12/23   Bedsole, Amy E, MD  apixaban  (ELIQUIS ) 5 MG TABS tablet Take 1 tablet (5 mg total) by mouth 2 (two) times daily. 01/23/24   Jacobo Evalene PARAS, MD  chlorpheniramine  (ALLERGY RELIEF) 4 MG tablet Take 1 tablet (4 mg total) by mouth 2 (two) times daily as needed for allergies. 10/11/23   Corwin Antu, FNP  Cholecalciferol  (VITAMIN D3) 2000 units TABS Take 2,000 Units by mouth daily.    [provider]  ezetimibe  (ZETIA ) 10 MG tablet Take 1 tablet (10 mg total) by mouth daily. 11/07/23   Corwin Antu, FNP  fluticasone  (FLONASE ) 50 MCG/ACT nasal spray Place 1 spray into both nostrils as needed for allergies.     [provider]  levothyroxine  (SYNTHROID ) 75 MCG tablet Take 1 tablet (75 mcg total) by mouth daily. 11/07/23   Dugal, Tabitha, FNP  meclizine  (ANTIVERT ) 12.5 MG tablet Take 1 tablet (12.5 mg total) by mouth 3 (three) times daily as needed for dizziness. 03/21/23   Dugal, Tabitha, FNP  ondansetron  (ZOFRAN -ODT) 4 MG disintegrating tablet Take 1 tablet (4 mg total) by mouth every 8 (eight) hours as needed for nausea or vomiting. 03/05/24   May, Deanna J, NP  OVER THE COUNTER MEDICATION Sinus and pain, take PRN.    [provider]  OVER THE COUNTER MEDICATION Pro-biotic one capsule daily. Patient not taking: Reported on 05/12/2024    [provider]  predniSONE  (DELTASONE ) 10 MG tablet Take 4 tablets (40 mg total) by mouth daily with breakfast for 14 days, THEN 3 tablets (30 mg total) daily with breakfast for 14 days, THEN 2 tablets (20 mg total) daily with breakfast for 14 days. 05/12/24 06/23/24  May, Deanna J, NP    Allergies: Simvastatin, Statins, Bactrim [sulfamethoxazole -trimethoprim], and Sertraline     Review of Systems Negative except as per HPI Updated Vital Signs BP (!) 158/79   Pulse 65   Temp 98.9 F (37.2 C) (Oral)   Resp (!) 23   Ht 5' 5 (1.651 m)  Wt 78.5 kg   SpO2 95%   BMI 28.80 kg/m   Physical Exam Vitals and nursing note reviewed.  Constitutional:      General: She is not in acute distress.    Appearance: She is well-developed. She is not diaphoretic.  HENT:     Head: Normocephalic and atraumatic.     Mouth/Throat:     Mouth: Mucous membranes are moist.     Pharynx: Oropharynx is clear. No oropharyngeal exudate or posterior oropharyngeal erythema.  Eyes:     Conjunctiva/sclera: Conjunctivae normal.  Cardiovascular:     Rate and Rhythm: Normal rate and regular rhythm.     Heart sounds: Normal heart sounds.  Pulmonary:      Effort: Pulmonary effort is normal.     Breath sounds: Normal breath sounds. No wheezing.  Musculoskeletal:     Cervical back: Neck supple.     Right lower leg: No edema.     Left lower leg: No edema.  Skin:    General: Skin is warm and dry.     Findings: No erythema or rash.  Neurological:     Mental Status: She is alert and oriented to person, place, and time.  Psychiatric:        Behavior: Behavior normal.     (all labs ordered are listed, but only abnormal results are displayed) Labs Reviewed  COMPREHENSIVE METABOLIC PANEL WITH GFR - Abnormal; Notable for the following components:      Result Value   Potassium 3.1 (*)    Glucose, Bld 109 (*)    Total Protein 5.9 (*)    All other components within normal limits  CBC WITH DIFFERENTIAL/PLATELET    EKG: EKG Interpretation Date/Time:  Saturday June 07 2024 09:17:51 EDT Ventricular Rate:  73 PR Interval:  72 QRS Duration:  84 QT Interval:  398 QTC Calculation: 439 R Axis:   49  Text Interpretation: Sinus rhythm Short PR interval Abnormal R-wave progression, early transition since last tracing no significant change Confirmed by Lenor Hollering [phone]) on 06/07/2024 11:25:31 AM  Radiology: No results found.   Procedures   Medications Ordered in the ED  predniSONE  (DELTASONE ) tablet 60 mg (60 mg Oral Given 06/07/24 0939)  diphenhydrAMINE  (BENADRYL ) injection 12.5 mg (12.5 mg Intravenous Given 06/07/24 0939)  famotidine  (PEPCID ) tablet 20 mg (20 mg Oral Given 06/07/24 0939)    Clinical Course as of 06/07/24 1503  Sat Jun 07, 2024  0926 BP 181/72 [LM]    Clinical Course User Index [LM] Beverley Leita LABOR, PA-C                                 Medical Decision Making Amount and/or Complexity of Data Reviewed Labs: ordered.  Risk Prescription drug management.   This patient presents to the ED for concern of possible medication reaction, this involves an extensive number of treatment options, and is a complaint  that carries with it a high risk of complications and morbidity.  The differential diagnosis includes but not limited to medication reaction/side effect, viral illness.    Co morbidities / Chronic conditions that complicate the patient evaluation  Vitamin D  deficiency, hyperlipidemia, osteopenia, hypothyroid, colitis, DVT   Additional history obtained:  Additional history obtained from EMR External records from outside source obtained and reviewed including prior labs on file.  Note from GI this morning regarding concern for possible arctic reaction Sumayya managed by her low-dose prednisone .  Lab Tests:  I Ordered, and personally interpreted labs.  The pertinent results include: CBC within normals.  CMP with mild hypokalemia.   Cardiac Monitoring: / EKG:  The patient was maintained on a cardiac monitor.  I personally viewed and interpreted the cardiac monitored which showed an underlying rhythm of: Ennis rhythm, rate 73   Problem List / ED Course / Critical interventions / Medication management  73 year old female presents with concern for reaction to her Tremfya as above.  On exam, is alert and appropriate, no distress, tolerating secretions, respirations even and unlabored with clear lung sounds bilaterally.  Patient is provided with prednisone , Benadryl , Pepcid .  Symptoms resolved.  Labs without significant findings.  Patient is currently on a taper off of her prednisone , states that she is currently taking 20 mg daily, has not taken her dose today.  She is provided with 60 mg of prednisone  p.o. in the ER.  Recommend take 50 mg tomorrow and call her GI team for further management Monday.  Continue with Benadryl  and Pepcid  as needed which may dictate the need to continue higher dose of prednisone .  Return to ER for any worsening or concerning symptoms. I ordered medication including prednisone , Benadryl , Pepcid  Reevaluation of the patient after these medicines showed that the patient  symptoms resolved I have reviewed the patients home medicines and have made adjustments as needed   Consultations Obtained:  I requested consultation with the ER attending, Dr. Lenor,  and discussed lab and imaging findings as well as pertinent plan - they recommend: Agree with plan of care   Social Determinants of Health:  Has PCP and specialty care team   Test / Admission - Considered:  Stable for discharge      Final diagnoses:  Adverse effect of drug, initial encounter    ED Discharge Orders     None          Beverley Leita LABOR, PA-C 06/07/24 1503    Lenor Hollering, MD 06/07/24 1511

## 2024-06-07 NOTE — ED Triage Notes (Addendum)
 Arrives ambulatory to the ED with complaints of having suspected allergic reaction to an IV infusion she had 2 days ago. Patient had an infusion for the first time for her diverticulitis.  Now reports developing tongue tingling, tremors.  Currently on steroids

## 2024-06-07 NOTE — Telephone Encounter (Signed)
 Contacted by patient this morning She had her first home Tremfya infusion on Thursday Over the day yesterday and this morning she has noticed chest tightness, swelling in her face, minor swelling in her tongue and she feels very jittery No dyspnea or trouble swallowing at this point No known fever  She is still on prednisone  20 mg tapering down from 40 for her colitis  I recommended that she go to the emergency department for evaluation for an allergic reaction to the infusion Tremfya from 36 hours ago It is possible that her prednisone  has protected her from an even more serious or anaphylactic type reaction Either way I recommended she be seen immediately She does not have diphenhydramine  or famotidine  at home  We will need to follow this up, likely let the drug company know the infusion reaction given this is a relatively new medication for IBD and I will notify Dr. Federico.  Patient will go to [facility]  Time provided for questions and answers and she thanked me for the call

## 2024-06-07 NOTE — Discharge Instructions (Signed)
 And take Benadryl  and Pepcid  if your symptoms return.  If symptoms improve at that point, you can manage with Benadryl  and Pepcid  as directed at home.  If symptoms progress at all, please return to the emergency room for further evaluation and call EMS if necessary.  Recommend taking 50 mg of your prednisone  tomorrow (Sunday).  Call your GI doctor on Monday to discuss how you are doing.

## 2024-06-09 NOTE — Telephone Encounter (Signed)
 Patient requesting an update regarding previous notes. Advised patient we are waiting for Dr. Federico to review and give her recommendations. Patient is concerned regarding her prednisone  medication. Please advise, thank you.

## 2024-06-09 NOTE — Telephone Encounter (Addendum)
 Pt was contacted in regard to previous message.   Pt stated that she had her infusion on Thursday, did not sleep well on Friday night and woke up Saturday morning with swelling to her face and tongue.   Pt stated that she went to the ED on Saturday and was given 60mg  of Prednisone . Pt stated that she feels that her symptoms have resolved.  Pt stated that she took 60 MG of prednisone   in the ED on Saturday, 50 Mg on Sunday at home  and was notified to contact GI office in regard to future tapering down. Pt was previous tapering down from prednisone  taper that she was prescribed prior  and was currently on 20 mg until this recent adverse event.  Pt review and advise on Prednisone  Taper.  Pt was scheduled for a Follow Up appointment to see Deanna May NP on 06/16/2024 at 1:30 PM. Pt made aware.  [facility] made aware of recent averse effect.  I spoke with Delon with [facility]  directly and notified her and requested that her pharmacy team follow up and notify Tremfya directly.

## 2024-06-09 NOTE — Telephone Encounter (Signed)
 Spoke with Pt. Documented in phone call note. Routed to Dr. Federico.

## 2024-06-10 ENCOUNTER — Other Ambulatory Visit: Payer: Self-pay

## 2024-06-10 DIAGNOSIS — K51811 Other ulcerative colitis with rectal bleeding: Secondary | ICD-10-CM

## 2024-06-10 MED ORDER — PREDNISONE 10 MG PO TABS: 20.0000 mg | ORAL_TABLET | Freq: Every day | ORAL | 0 refills | Status: DC

## 2024-06-10 NOTE — Telephone Encounter (Signed)
 This has been addressed, see 8/9 TE for details

## 2024-06-10 NOTE — Telephone Encounter (Signed)
Prescription sent to pharmacy Pt made aware Pt verbalized understanding with all questions answered.   

## 2024-06-10 NOTE — Telephone Encounter (Signed)
 Pt was made aware of Dr. Federico recommendations: Pt stated that she only had 10 tablets left of the prednisone . Please advise on new prescription and quantity.

## 2024-06-12 ENCOUNTER — Telehealth: Payer: Self-pay

## 2024-06-12 NOTE — Telephone Encounter (Signed)
 Spoke to patient regarding her symptoms.  She started feeling lightheaded today.  She just dropped down from prednisone  30 mg on Tuesday to 20 mg yesterday.  She did take her 20 mg of prednisone  today.  She is drinking plenty of water at least 80 ounces per day as instructed by her PCP.  She has also been noting some right sided abdominal pain, which is slightly new for her.  Prior to this her pain was mostly in the left side.  She still has been seeing some bloody output in her stools, though not a substantial amount.  She is having some sensation of feeling sweaty but has not felt any signs of recurrent allergic reaction such as swelling of the throat, shortness of breath.  I instructed the patient to go ahead and increase her prednisone  back up to 30 mg daily to see if this will help with some of her lightheadedness.  This may also help with some inflammation of the colon if it is still active.  I did notice that her potassium level was low during her recent ED visit.  She was not instructed to take any extra potassium after her ED visit.  I recommended that she start taking her potassium chloride  pill 20 MEq daily for the next 4 days.  She has an appointment with her GI clinic next Monday and we can check her potassium levels as well as a LFTs, lipase, CRP, and CBC to see if there are any alternative sources of pain at that time.  I also recommend that we go ahead and get a stat CT scan to evaluate her ab pain.  Her right sided abdominal pain and sweats may be related to uncontrolled inflammation in the body such as due to her known colitis.  If patient's symptoms were to worsen, then I recommended she go to the ED.  Pod B triage lets go ahead and get a stat CT A/P w/contrast ordered for her due to worsened ab pain

## 2024-06-12 NOTE — Telephone Encounter (Signed)
 Updated order form with Entyvio orders given to [facility] with [facility].

## 2024-06-13 ENCOUNTER — Other Ambulatory Visit: Payer: Self-pay

## 2024-06-13 ENCOUNTER — Emergency Department (HOSPITAL_BASED_OUTPATIENT_CLINIC_OR_DEPARTMENT_OTHER)

## 2024-06-13 ENCOUNTER — Encounter (HOSPITAL_BASED_OUTPATIENT_CLINIC_OR_DEPARTMENT_OTHER): Payer: Self-pay

## 2024-06-13 ENCOUNTER — Telehealth: Payer: Self-pay | Admitting: Gastroenterology

## 2024-06-13 ENCOUNTER — Emergency Department (HOSPITAL_BASED_OUTPATIENT_CLINIC_OR_DEPARTMENT_OTHER)
Admission: EM | Admit: 2024-06-13 | Discharge: 2024-06-13 | Disposition: A | Discharge status: Home / Self Care | Attending: Emergency Medicine | Admitting: Emergency Medicine

## 2024-06-13 ENCOUNTER — Ambulatory Visit (HOSPITAL_BASED_OUTPATIENT_CLINIC_OR_DEPARTMENT_OTHER)
Admission: RE | Admit: 2024-06-13 | Discharge: 2024-06-13 | Disposition: A | Discharge status: Home / Self Care | Source: Ambulatory Visit | Attending: Internal Medicine | Admitting: Internal Medicine

## 2024-06-13 DIAGNOSIS — K5730 Diverticulosis of large intestine without perforation or abscess without bleeding: Secondary | ICD-10-CM | POA: Diagnosis not present

## 2024-06-13 DIAGNOSIS — K5190 Ulcerative colitis, unspecified, without complications: Secondary | ICD-10-CM | POA: Diagnosis not present

## 2024-06-13 DIAGNOSIS — Z90710 Acquired absence of both cervix and uterus: Secondary | ICD-10-CM | POA: Diagnosis not present

## 2024-06-13 DIAGNOSIS — R1031 Right lower quadrant pain: Secondary | ICD-10-CM

## 2024-06-13 DIAGNOSIS — Z79899 Other long term (current) drug therapy: Secondary | ICD-10-CM | POA: Diagnosis not present

## 2024-06-13 DIAGNOSIS — R61 Generalized hyperhidrosis: Secondary | ICD-10-CM

## 2024-06-13 DIAGNOSIS — K51811 Other ulcerative colitis with rectal bleeding: Secondary | ICD-10-CM

## 2024-06-13 DIAGNOSIS — E876 Hypokalemia: Secondary | ICD-10-CM

## 2024-06-13 DIAGNOSIS — R109 Unspecified abdominal pain: Secondary | ICD-10-CM | POA: Diagnosis not present

## 2024-06-13 DIAGNOSIS — R002 Palpitations: Secondary | ICD-10-CM | POA: Diagnosis not present

## 2024-06-13 DIAGNOSIS — Z7901 Long term (current) use of anticoagulants: Secondary | ICD-10-CM | POA: Insufficient documentation

## 2024-06-13 DIAGNOSIS — R252 Cramp and spasm: Secondary | ICD-10-CM | POA: Diagnosis not present

## 2024-06-13 DIAGNOSIS — R5383 Other fatigue: Secondary | ICD-10-CM

## 2024-06-13 DIAGNOSIS — K51919 Ulcerative colitis, unspecified with unspecified complications: Secondary | ICD-10-CM | POA: Insufficient documentation

## 2024-06-13 LAB — CBC
HCT: 42.8 % (ref 36.0–46.0)
Hemoglobin: 14 g/dL (ref 12.0–15.0)
MCH: 30.2 pg (ref 26.0–34.0)
MCHC: 32.7 g/dL (ref 30.0–36.0)
MCV: 92.4 fL (ref 80.0–100.0)
Platelets: 375 K/uL (ref 150–400)
RBC: 4.63 MIL/uL (ref 3.87–5.11)
RDW: 14.2 % (ref 11.5–15.5)
WBC: 7.3 K/uL (ref 4.0–10.5)
nRBC: 0 % (ref 0.0–0.2)

## 2024-06-13 LAB — BASIC METABOLIC PANEL WITH GFR
Anion gap: 11 (ref 5–15)
BUN: 15 mg/dL (ref 8–23)
CO2: 25 mmol/L (ref 22–32)
Calcium: 9.5 mg/dL (ref 8.9–10.3)
Chloride: 103 mmol/L (ref 98–111)
Creatinine, Ser: 0.98 mg/dL (ref 0.44–1.00)
GFR, Estimated: >60 mL/min (ref >60)
Glucose, Bld: 108 mg/dL — ABNORMAL HIGH (ref 70–99)
Potassium: 4.2 mmol/L (ref 3.5–5.1)
Sodium: 139 mmol/L (ref 135–145)

## 2024-06-13 LAB — TROPONIN T, HIGH SENSITIVITY: Troponin T High Sensitivity: <15 ng/L (ref 0–19)

## 2024-06-13 LAB — MAGNESIUM: Magnesium: 2.3 mg/dL (ref 1.7–2.4)

## 2024-06-13 MED ORDER — IOHEXOL 300 MG/ML  SOLN
80.0000 mL | Freq: Once | INTRAMUSCULAR | Status: AC | PRN
  Administered 2024-06-13: 80 mL via INTRAVENOUS

## 2024-06-13 NOTE — ED Provider Notes (Signed)
 [place] EMERGENCY DEPARTMENT AT [facility] Provider Note   CSN: [id number] Arrival date & time: 06/13/24  1811     Patient presents with: Muscle Pain and Palpitations   Nicole Daniels is a 73 y.o. female.   Patient with a complaint of muscle cramps palpitations low energy.  She is being followed by [facility] gastroenterology has a known ulcerative colitis and recently had an infusion immuno type infusion which may have been the cause of her symptoms that led to her visit on August 9.  Her GI doctor is aware that she has started on steroids.  And they are thinking about modifying her her immune therapy for the ulcerative colitis.  Patient was concerned about her potassium being low because it was low on the ninth.  Here today it is normal.  Potassium was 4.2.  Magnesium  is also normal at 2.3.  CBC normal with a white count of 7.3 hemoglobin 14.0 platelets 375 initial troponin was less than 15.  EKG did have a few premature contractions.  But no significant arrhythmia.  And chest x-ray was negative.       Prior to Admission medications   Medication Sig Start Date End Date Taking? Authorizing Provider  acetaminophen  (TYLENOL ) 325 MG tablet Take 650 mg by mouth every 6 (six) hours as needed.    [provider]  albuterol  (VENTOLIN  HFA) 108 (90 Base) MCG/ACT inhaler Inhale 2 puffs into the lungs every 6 (six) hours as needed for wheezing or shortness of breath. 12/12/23   Bedsole, Amy E, MD  apixaban  (ELIQUIS ) 5 MG TABS tablet Take 1 tablet (5 mg total) by mouth 2 (two) times daily. 01/23/24   Jacobo Evalene PARAS, MD  chlorpheniramine  (ALLERGY RELIEF) 4 MG tablet Take 1 tablet (4 mg total) by mouth 2 (two) times daily as needed for allergies. 10/11/23   Dugal, Tabitha, FNP  Cholecalciferol  (VITAMIN D3) 2000 units TABS Take 2,000 Units by mouth daily.    [provider]  ezetimibe  (ZETIA ) 10 MG tablet Take 1 tablet (10 mg total) by mouth daily. 11/07/23   Corwin Antu, FNP  fluticasone  (FLONASE ) 50 MCG/ACT nasal spray Place 1 spray into both nostrils as needed for allergies.     [provider]  levothyroxine  (SYNTHROID ) 75 MCG tablet Take 1 tablet (75 mcg total) by mouth daily. 11/07/23   Dugal, Tabitha, FNP  meclizine  (ANTIVERT ) 12.5 MG tablet Take 1 tablet (12.5 mg total) by mouth 3 (three) times daily as needed for dizziness. 03/21/23   Corwin Antu, FNP  ondansetron  (ZOFRAN -ODT) 4 MG disintegrating tablet Take 1 tablet (4 mg total) by mouth every 8 (eight) hours as needed for nausea or vomiting. 03/05/24   May, Deanna J, NP  OVER THE COUNTER MEDICATION Sinus and pain, take PRN.    [provider]  OVER THE COUNTER MEDICATION Pro-biotic one capsule daily. Patient not taking: Reported on 05/12/2024    [provider]  predniSONE  (DELTASONE ) 10 MG tablet Take 4 tablets (40 mg total) by mouth daily with breakfast for 14 days, THEN 3 tablets (30 mg total) daily with breakfast for 14 days, THEN 2 tablets (20 mg total) daily with breakfast for 14 days. 05/12/24 06/23/24  May, Deanna J, NP  predniSONE  (DELTASONE ) 10 MG tablet Take 2 tablets (20 mg total) by mouth daily with breakfast. 06/10/24   Federico Rosario BROCKS, MD    Allergies: Simvastatin, Statins, Bactrim [sulfamethoxazole -trimethoprim], and Sertraline     Review of Systems  Constitutional:  Positive  for fever. Negative for chills.  HENT:  Negative for ear pain and sore throat.   Eyes:  Negative for pain and visual disturbance.  Respiratory:  Negative for cough and shortness of breath.   Cardiovascular:  Positive for palpitations. Negative for chest pain.  Gastrointestinal:  Negative for abdominal pain and vomiting.  Genitourinary:  Negative for dysuria and hematuria.  Musculoskeletal:  Negative for arthralgias and back pain.  Skin:  Negative for color change and rash.  Neurological:  Positive for weakness. Negative for seizures and syncope.  All other systems reviewed and are  negative.   Updated Vital Signs BP (!) 164/84   Pulse 69   Temp 98 F (36.7 C) (Oral)   Resp 18   Ht 1.651 m (5' 5)   Wt 78.9 kg   SpO2 97%   BMI 28.96 kg/m   Physical Exam Vitals and nursing note reviewed.  Constitutional:      General: She is not in acute distress.    Appearance: Normal appearance. She is well-developed.  HENT:     Head: Normocephalic and atraumatic.  Eyes:     Extraocular Movements: Extraocular movements intact.     Conjunctiva/sclera: Conjunctivae normal.     Pupils: Pupils are equal, round, and reactive to light.  Cardiovascular:     Rate and Rhythm: Normal rate and regular rhythm.     Heart sounds: No murmur heard. Pulmonary:     Effort: Pulmonary effort is normal. No respiratory distress.     Breath sounds: Normal breath sounds.  Abdominal:     Palpations: Abdomen is soft.     Tenderness: There is no abdominal tenderness.  Musculoskeletal:        General: No swelling.     Cervical back: Normal range of motion and neck supple.  Skin:    General: Skin is warm and dry.     Capillary Refill: Capillary refill takes less than 2 seconds.  Neurological:     General: No focal deficit present.     Mental Status: She is alert and oriented to person, place, and time.  Psychiatric:        Mood and Affect: Mood normal.     (all labs ordered are listed, but only abnormal results are displayed) Labs Reviewed  BASIC METABOLIC PANEL WITH GFR - Abnormal; Notable for the following components:      Result Value   Glucose, Bld 108 (*)    All other components within normal limits  CBC  MAGNESIUM   TROPONIN T, HIGH SENSITIVITY    EKG: EKG Interpretation Date/Time:  Friday June 13 2024 18:21:51 EDT Ventricular Rate:  68 PR Interval:  158 QRS Duration:  70 QT Interval:  372 QTC Calculation: 395 R Axis:   22  Text Interpretation: Sinus rhythm with Premature supraventricular complexes Otherwise normal ECG When compared with ECG of 07-Jun-2024 09:17,  PREVIOUS ECG IS PRESENT Confirmed by Charleston Hankin [phone]) on 06/13/2024 6:28:35 PM  Radiology: [facility] Chest Port 1 View Result Date: 06/13/2024 CLINICAL DATA:  Palpitation and muscle cramps. EXAM: PORTABLE CHEST 1 VIEW COMPARISON:  December 29, 2023 FINDINGS: The heart size and mediastinal contours are within normal limits. Both lungs are clear. Radiopaque surgical clips are seen overlying the right upper quadrant. Postoperative changes are seen within the lower cervical spine. The visualized skeletal structures are unremarkable. IMPRESSION: No active disease. Electronically Signed   By: Suzen Dials M.D.   On: 06/13/2024 19:31   CT ABDOMEN PELVIS W CONTRAST Result Date:  06/13/2024 CLINICAL DATA:  Worsening abdominal pain.  History of ulcer colitis EXAM: CT ABDOMEN AND PELVIS WITH CONTRAST TECHNIQUE: Multidetector CT imaging of the abdomen and pelvis was performed using the standard protocol following bolus administration of intravenous contrast. RADIATION DOSE REDUCTION: This exam was performed according to the departmental dose-optimization program which includes automated exposure control, adjustment of the mA and/or kV according to patient size and/or use of iterative reconstruction technique. CONTRAST:  80mL OMNIPAQUE  IOHEXOL  300 MG/ML  SOLN COMPARISON:  None Available. FINDINGS: Lower chest: Lung bases are clear. Hepatobiliary: No focal hepatic lesion. Normal gallbladder. No biliary duct dilatation. Common bile duct is normal. Pancreas: Pancreas is normal. No ductal dilatation. No pancreatic inflammation. Spleen: Normal spleen Adrenals/urinary tract: Adrenal glands normal. Exophytic intermediate density lesion from the RIGHT kidney measures 15 mm on image 28. This lesion was characterized as benign Bosniak 2 renal cyst on CT 12/27/2022. No follow-up recommended for benign renal lesion. Ureters and bladder normal. Stomach/Bowel: Stomach, duodenum and small-bowel normal. Appendix not identified.  Ascending, transverse and descending colon are normal. There is no evidence of inflammation of the colon. Several diverticula sigmoid colon. No fistula or abscess. Rectum normal. Vascular/Lymphatic: Abdominal aorta is normal caliber with atherosclerotic calcification. There is no retroperitoneal or periportal lymphadenopathy. No pelvic lymphadenopathy. Reproductive: Post hysterectomy.  Adnexa unremarkable Other: No free fluid. Musculoskeletal: No aggressive osseous lesion. IMPRESSION: 1. No evidence of active ulcerative colitis. No acute inflammation of the small bowel or colon. Mild sigmoid diverticulosis without evidence acute diverticulitis. 2. No acute findings abdomen pelvis. Electronically Signed   By: Jackquline Boxer M.D.   On: 06/13/2024 18:59     Procedures   Medications Ordered in the ED - No data to display                                  Medical Decision Making Amount and/or Complexity of Data Reviewed Labs: ordered. Radiology: ordered.   Potassium magnesium  normal here.  Would recommend close follow-up with your gastroenterologist.  In regard to the ulcerative colitis treatment.  I suspect that immunotherapy for the ulcerative colitis is may be playing a role.   Final diagnoses:  Ulcerative colitis with complication, unspecified location [facility])  Palpitation  Muscle cramp  Other fatigue    ED Discharge Orders     None          Geraldene Hamilton, MD 06/13/24 1939

## 2024-06-13 NOTE — ED Triage Notes (Signed)
 Pt reports decreased potassium last week. Pt reports having increased muscle cramps and palpitations.

## 2024-06-13 NOTE — Telephone Encounter (Signed)
 Approved Authorization #[id number]  Tracking [id number]

## 2024-06-13 NOTE — Discharge Instructions (Signed)
 Follow-up with your primary care doctor as needed.  Would definitely follow-up closely with your gastroenterologist.  I suspect that symptoms could be related to the treatment for the ulcerative colitis.  No acute findings here today potassium was normal and magnesium  was normal.  No significant abnormalities on cardiac monitor.

## 2024-06-13 NOTE — Telephone Encounter (Signed)
 Inbound call from patient stating she has been having issues with low potassium. Patient states today she is very weak and is experiencing muscle cramps and is requesting a call to discuss with nurse. Please advise.

## 2024-06-13 NOTE — Telephone Encounter (Signed)
 Would recommend that she go to the ED for further evaluation at this point.

## 2024-06-13 NOTE — Telephone Encounter (Signed)
 Patient has been scheduled for STAT CT today 06/13/24 at 7:00 pm at [facility], arrive by 5:00 pm for oral contrast. Labs ordered. Patient is aware & has been advised on when/where to go. Pre-cert team notified as well of STAT order.

## 2024-06-13 NOTE — Telephone Encounter (Signed)
 Pt made aware to go to the ED for sooner evaluation. Pt verbalized all understanding.

## 2024-06-13 NOTE — Telephone Encounter (Addendum)
 Returned call to patient & she is experencing muscle cramps in her hands and feet, ongoing dizziness, weakness, and feels at times her heart is racing. She is still able to walk in her home/mailbox, but has to rest shortly after. She took 20 mEq of potassium yesterday & today. Scheduled for STAT CT later this evening & labs on Monday. Will check with Dr. Federico to see if she feels labs may need to be done sooner or recommend ED visit for sooner eval. Secure chat sent to MD as well.

## 2024-06-16 ENCOUNTER — Encounter: Payer: Self-pay | Admitting: Gastroenterology

## 2024-06-16 ENCOUNTER — Ambulatory Visit: Admitting: Gastroenterology

## 2024-06-16 ENCOUNTER — Other Ambulatory Visit (INDEPENDENT_AMBULATORY_CARE_PROVIDER_SITE_OTHER)

## 2024-06-16 ENCOUNTER — Ambulatory Visit: Payer: Self-pay | Admitting: Internal Medicine

## 2024-06-16 ENCOUNTER — Ambulatory Visit: Payer: Self-pay | Admitting: Gastroenterology

## 2024-06-16 VITALS — BP 142/60 | HR 72 | Ht 62.6 in | Wt 178.4 lb

## 2024-06-16 DIAGNOSIS — E876 Hypokalemia: Secondary | ICD-10-CM | POA: Diagnosis not present

## 2024-06-16 DIAGNOSIS — K51811 Other ulcerative colitis with rectal bleeding: Secondary | ICD-10-CM | POA: Diagnosis not present

## 2024-06-16 DIAGNOSIS — R1031 Right lower quadrant pain: Secondary | ICD-10-CM

## 2024-06-16 DIAGNOSIS — R61 Generalized hyperhidrosis: Secondary | ICD-10-CM

## 2024-06-16 LAB — CBC
HCT: 45.5 % (ref 36.0–46.0)
Hemoglobin: 14.9 g/dL (ref 12.0–15.0)
MCHC: 32.7 g/dL (ref 30.0–36.0)
MCV: 90.8 fl (ref 78.0–100.0)
Platelets: 414 K/uL — ABNORMAL HIGH (ref 150.0–400.0)
RBC: 5 Mil/uL (ref 3.87–5.11)
RDW: 14.9 % (ref 11.5–15.5)
WBC: 11 K/uL — ABNORMAL HIGH (ref 4.0–10.5)

## 2024-06-16 LAB — COMPREHENSIVE METABOLIC PANEL WITH GFR
ALT: 18 U/L (ref 0–35)
AST: 13 U/L (ref 0–37)
Albumin: 3.9 g/dL (ref 3.5–5.2)
Alkaline Phosphatase: 73 U/L (ref 39–117)
BUN: 16 mg/dL (ref 6–23)
CO2: 27 meq/L (ref 19–32)
Calcium: 9.7 mg/dL (ref 8.4–10.5)
Chloride: 102 meq/L (ref 96–112)
Creatinine, Ser: 0.94 mg/dL (ref 0.40–1.20)
GFR: 59.87 mL/min — ABNORMAL LOW (ref >60.00)
Glucose, Bld: 125 mg/dL — ABNORMAL HIGH (ref 70–99)
Potassium: 4.1 meq/L (ref 3.5–5.1)
Sodium: 139 meq/L (ref 135–145)
Total Bilirubin: 0.6 mg/dL (ref 0.2–1.2)
Total Protein: 6.5 g/dL (ref 6.0–8.3)

## 2024-06-16 LAB — C-REACTIVE PROTEIN: CRP: <1 mg/dL (ref 0.5–20.0)

## 2024-06-16 LAB — HEPATIC FUNCTION PANEL
ALT: 18 U/L (ref 0–35)
AST: 13 U/L (ref 0–37)
Albumin: 3.9 g/dL (ref 3.5–5.2)
Alkaline Phosphatase: 73 U/L (ref 39–117)
Bilirubin, Direct: 0.1 mg/dL (ref 0.0–0.3)
Total Bilirubin: 0.6 mg/dL (ref 0.2–1.2)
Total Protein: 6.5 g/dL (ref 6.0–8.3)

## 2024-06-16 LAB — LIPASE: Lipase: 34 U/L (ref 11.0–59.0)

## 2024-06-16 NOTE — Progress Notes (Signed)
 Chief Complaint:follow-up UC Primary GI Doctor: Dr. Federico  HPI:   73 year old female history of hypothyroidism, BPPV, PFO, tubular adenomas,history of polyps and diverticulosis noted on colonoscopy last year, DVT/PE LLE currently on apixaban  for less than a month (started 12/24/23 after ED diagnosis) presents for follow-up.    Patient states in August she had a sinus infection and was given a 14-day course of Augmentin .  Midway through the course of Augmentin  she developed diarrhea and associated with her diarrhea she noticed a red jelly substance that would come out in the stool and on the tissue paper.  She does have history of internal hemorrhoids and has had banding in the past.   She came in for lab work 07/19/2023 with normal CBC and CMP.  Since she was having LLQ pain CT abdomen pelvis with contrast 07/19/2023 was obtained and showed diverticulosis without acute diverticulitis.   She continued to have 3-4 stools per day.  Occasional fecal incontinence with urinating.  Stools vary from watery to sand to sometimes pebbles.  She has associated LLQ pain that is relieved with a bowel movement.  She typically will see this red jelly substance with each bowel movement. Reports some nausea, no vomiting.  Denies unintentional weight loss. 08/09/23 Stool study via diatherix is negative for infections including C diff    08/27/2023 patient last seen in GI office with Dr. Federico for hemorrhoid banding. At that time patient was still having diarrhea with 3-4 bowel movements per day for the last couple of months after her colonoscopy.  She does have some rectal bleeding and rectal pain.  Anusol  suppositories have not really helped.  Dr. Federico recommended trial of Imodium 2 mg as needed to help with diarrhea. 01/11/2024 patient seen in ED for complaints of rectal bleeding. She has developed some abdominal pain, intermittent, waxing and waning, usually worse on the left side. Hemoccult performed, but  no stool in rectal vault. Patient has pictures of stool that show obvious bleeding, red blood. Hemoccult not sent due to poor sample. CT, no obvious infection. Stable renal cyst. I would recommend IVC filter eval. Can resume eliquis  (at least) low dose from tomorrow.  Labs show: Potassium 3.2, BUN 10, creatinine 0.89, normal LFTs, WBC 6.2, hemoglobin 14, platelets 419 01/19/2024 patient seen in ED for left leg pain.  Patient did states she fell off a curb a couple days ago.Bilateral LE US , which revealed DVT in L tibial and perioneal veins which is improved from prior US , no new clot burden visualized.  01/23/2024 patient seen by oncology to evaluate for elevated factor VIII level in the setting of acute blood clot and anticoagulation.  Recommend patient stay on Eliquis  5 mg twice daily per day for total of 6 months completing treatment at the end of August.  01/28/24 patient seen by myself in the office with main complaint of rectal bleeding and abdominal pain.  Upon examination it was noted she had liquid brown mixed with red stool with mucus.  Ordered flexible sigmoidoscopy. Flex sig on 4/1 showed - Diffuse inflammation was found in the rectum, in the sigmoid colon and in the descending colon. Biopsied.  - Diverticulosis in the sigmoid colon.  - Non- bleeding internal hemorrhoids. Path: showed that she has left-sided inflammation in her colon as a result of ulcerative colitis.  Patient started on Rowasa  enemas. 02/07/24 ED visit for generalized feelings of unwellness, nausea, left lower quadrant pain. CT abdomen pelvis demonstrates acute uncomplicated diverticulitis. Augmentin  for 7 days given  in ED and sent to pharmacy.   02/11/24 patient followed up with me in the office and at that time was doing well on Rowasa  enemas.  She was also completing Augmentin  for recent diagnosis of acute uncomplicated diverticulitis.  Patient had expressed some left lower quadrant abdominal discomfort with nausea.  She was  also being supplemented for low potassium. 02/28/2024 patient was having issues with enemas staying in therefore we offered switching to oral mesalamine .  Patient started mesalamine  4.8 g and within the first week patient started to experience worsening diarrhea, fever, headache, and poor appetite.  Instructed patient to discontinue mesalamine  and order placed for budesonide . Unfortunately patient started to feel worse after conversation with severe abdominal pain and went to ED. 03/06/2024 patient presents to ED with complaints of abdominal pain with loose bloody stools and fever of 101.  Nausea without vomiting.CT does not show concerning process such as abscess, diverticulitis or free air. Diffuse colonic wall thickening compatible with colitis.   Labs show: Normal lipase, normal hemoglobin 13.7, WBC 10, magnesium  2.3, potassium 3.1 which was corrected.  Patient did not wish to be admitted and discharged home.  Patient received dose of Decadron . 06/13/24 ED visits    Interval History     Patient presents for follow-up on Ulcerative colitis. Patient was started on Tremfya on 8/7. After patient had infusion she reports she did not sleep well on Friday night and woke up on Saturday with swelling in face and tongue. Pt stated that she went to the ED on Saturday and was given 60mg  of Prednisone . Patient has slowly titrated off down on the prednisone  and taking 30 mg p.o. daily since last Tuesday.  Patient states she has a lot of jitteriness and irritability being on the steroids. Patient has two episodes over the last 1-2 weeks where she had blood with wiping and in stool. She is currently having 2-3 stools per day. She had right lower abdominal pain a few days when she went to ED, but since then has resolved.   Patient has had issues intermittently with hypokalemia. She took Potassium Thursday through Sunday per Dr. Federico. Her levels in ED were normal.  Patient also taking OTC Vitamin D  5,000 units every  other day.    Wt Readings from Last 3 Encounters:  06/13/24 174 lb (78.9 kg)  06/07/24 173 lb 1 oz (78.5 kg)  05/12/24 173 lb (78.5 kg)    Past Medical History:  Diagnosis Date   Abdominal pain, right lower quadrant    Acquired cyst of kidney 01/08/2012   pt denies history of    Acute sinusitis, unspecified    Adenomatous colon polyp    Allergy    Angiomyolipoma    kidney   Anxiety    OCCASIONAL   Cataract    Chest pain    Chest pain, unspecified    Depressive disorder, not elsewhere classified 01/08/2012   not currently    Diverticulosis    Dizziness and giddiness    DVT (deep venous thrombosis) (HCC)    Dysfunction of eustachian tube    H/O Spinal surgery 02/20/2017   Hemorrhoids    History of diverticulitis of colon    History of fusion of cervical spine 04/25/2018   IBS (irritable bowel syndrome)    Liver lesion 2011   Neoplasm of skin of face    malignant   OA (osteoarthritis)    knee, NECK   Obesity, unspecified    Ostium secundum type atrial septal defect  Other and unspecified hyperlipidemia    Other malaise and fatigue    Palpitations    Pulmonary embolism (HCC)    Shortness of breath    Thyroid  disease    Ulcerative colitis (HCC)    Unspecified essential hypertension    pt states she does not have HTN   Unspecified sinusitis (chronic)    Unspecified vitamin D  deficiency     Past Surgical History:  Procedure Laterality Date   ABDOMINAL HYSTERECTOMY     BREAST REDUCTION SURGERY  1990   CERVICAL DISC SURGERY     Dr.Nudleman   CHOLECYSTECTOMY     COLONOSCOPY     DILATION AND CURETTAGE OF UTERUS     EYE SURGERY Bilateral 07/30/2017   cataract sx   FLEXIBLE SIGMOIDOSCOPY  01/08/2012   Procedure: FLEXIBLE SIGMOIDOSCOPY;  Surgeon: Princella CHRISTELLA Nida, MD;  Location: [facility];  Service: Endoscopy;  Laterality: N/A;   HEMORRHOID SURGERY     KNEE ARTHROTOMY     pt states no    LAMINECTOMY N/A 01/31/2017   Procedure: Thoracic Laminectomy and  marsupialization of arachnoid cyst;  Surgeon: Lamar Peaches, MD;  Location: [facility] OR;  Service: Neurosurgery;  Laterality: N/A;   LEFT OOPHORECTOMY  1976   NECK SURGERY N/A 03/2018   PARTIAL HYSTERECTOMY     right ovary remains   REDUCTION MAMMAPLASTY     TONSILLECTOMY  1972   UPPER GASTROINTESTINAL ENDOSCOPY      Current Outpatient Medications  Medication Sig Dispense Refill   acetaminophen  (TYLENOL ) 325 MG tablet Take 650 mg by mouth every 6 (six) hours as needed.     albuterol  (VENTOLIN  HFA) 108 (90 Base) MCG/ACT inhaler Inhale 2 puffs into the lungs every 6 (six) hours as needed for wheezing or shortness of breath. 8 g 2   apixaban  (ELIQUIS ) 5 MG TABS tablet Take 1 tablet (5 mg total) by mouth 2 (two) times daily. 60 tablet 6   chlorpheniramine  (ALLERGY RELIEF) 4 MG tablet Take 1 tablet (4 mg total) by mouth 2 (two) times daily as needed for allergies.     Cholecalciferol  (VITAMIN D3) 2000 units TABS Take 2,000 Units by mouth daily.     ezetimibe  (ZETIA ) 10 MG tablet Take 1 tablet (10 mg total) by mouth daily. 90 tablet 3   fluticasone  (FLONASE ) 50 MCG/ACT nasal spray Place 1 spray into both nostrils as needed for allergies.      levothyroxine  (SYNTHROID ) 75 MCG tablet Take 1 tablet (75 mcg total) by mouth daily. 90 tablet 3   meclizine  (ANTIVERT ) 12.5 MG tablet Take 1 tablet (12.5 mg total) by mouth 3 (three) times daily as needed for dizziness. 30 tablet 0   ondansetron  (ZOFRAN -ODT) 4 MG disintegrating tablet Take 1 tablet (4 mg total) by mouth every 8 (eight) hours as needed for nausea or vomiting. 20 tablet 0   OVER THE COUNTER MEDICATION Sinus and pain, take PRN.     OVER THE COUNTER MEDICATION Pro-biotic one capsule daily. (Patient not taking: Reported on 05/12/2024)     predniSONE  (DELTASONE ) 10 MG tablet Take 4 tablets (40 mg total) by mouth daily with breakfast for 14 days, THEN 3 tablets (30 mg total) daily with breakfast for 14 days, THEN 2 tablets (20 mg total) daily with  breakfast for 14 days. 100 tablet 0   predniSONE  (DELTASONE ) 10 MG tablet Take 2 tablets (20 mg total) by mouth daily with breakfast. 60 tablet 0   No current facility-administered medications for this visit.  Allergies as of 06/16/2024 - Review Complete 06/13/2024  Allergen Reaction Noted   Simvastatin Other (See Comments) 01/27/2011   Statins Other (See Comments) 05/14/2017   Bactrim [sulfamethoxazole -trimethoprim] Itching 11/12/2013   Sertraline  Anxiety 04/10/2019    Family History  Problem Relation Age of Onset   Hypertension Mother    Diabetes Mother    Cirrhosis Father    Heart disease Father    Hypertension Sister    Diabetes Sister    Heart disease Brother        x 2   Breast cancer Cousin 73 - 78   Diabetes Cousin    Diabetes Other        aunt   Stroke Other        aunt   Other Son        brain tumor, age 46   Colon cancer Neg Hx    Esophageal cancer Neg Hx    Stomach cancer Neg Hx    Rectal cancer Neg Hx    Colon polyps Neg Hx     Review of Systems:    Constitutional: No weight loss, fever, chills, weakness or fatigue HEENT: Eyes: No change in vision               Ears, Nose, Throat:  No change in hearing or congestion Skin: No rash or itching Cardiovascular: No chest pain, chest pressure or palpitations   Respiratory: No SOB or cough Gastrointestinal: See HPI and otherwise negative Genitourinary: No dysuria or change in urinary frequency Neurological: No headache, dizziness or syncope Musculoskeletal: No new muscle or joint pain Hematologic: No bleeding or bruising Psychiatric: No history of depression or anxiety    Physical Exam:  Vital signs: There were no vitals taken for this visit.  Constitutional:   Pleasant  female appears to be in NAD, Well developed, Well nourished, alert and cooperative Throat: Oral cavity and pharynx without inflammation, swelling or lesion.  Respiratory: Respirations even and unlabored. Lungs clear to auscultation  bilaterally.   No wheezes, crackles, or rhonchi.  Cardiovascular: Normal S1, S2. Regular rate and rhythm. No peripheral edema, cyanosis or pallor.  Gastrointestinal:  Soft, nondistended, nontender. No rebound or guarding. Normal bowel sounds. No appreciable masses or hepatomegaly. Rectal:  Not performed.  Msk:  Symmetrical without gross deformities. Without edema, no deformity or joint abnormality.  Neurologic:  Alert and  oriented x4;  grossly normal neurologically.  Skin:   Dry and intact without significant lesions or rashes. Psychiatric: Oriented to person, place and time. Demonstrates good judgement and reason without abnormal affect or behaviors.  RELEVANT LABS AND IMAGING: CBC    Latest Ref Rng & Units 06/13/2024    6:23 PM 06/07/2024    9:30 AM 05/08/2024    3:51 PM  CBC  WBC 4.0 - 10.5 K/uL 7.3  8.0  6.2   Hemoglobin 12.0 - 15.0 g/dL 85.9  86.8  86.3   Hematocrit 36.0 - 46.0 % 42.8  39.8  41.1   Platelets 150 - 400 K/uL 375  340  430.0      CMP     Latest Ref Rng & Units 06/13/2024    6:23 PM 06/07/2024    9:30 AM 05/08/2024    3:51 PM  CMP  Glucose 70 - 99 mg/dL 891  890  856   BUN 8 - 23 mg/dL 15  13  13    Creatinine 0.44 - 1.00 mg/dL 9.01  9.06  9.02   Sodium 135 - 145  mmol/L 139  142  140   Potassium 3.5 - 5.1 mmol/L 4.2  3.1  3.3   Chloride 98 - 111 mmol/L 103  105  102   CO2 22 - 32 mmol/L 25  27  31    Calcium  8.9 - 10.3 mg/dL 9.5  9.3  9.1   Total Protein 6.5 - 8.1 g/dL  5.9  6.6   Total Bilirubin 0.0 - 1.2 mg/dL  0.6  0.5   Alkaline Phos 38 - 126 U/L  71  77   AST 15 - 41 U/L  16  13   ALT 0 - 44 U/L  15  13      Lab Results  Component Value Date   TSH 1.30 10/11/2023  05/08/24 labs show:folate 21.4, potassium 3.3, CRP <1, B 12 312, vitamin D  37.47, iron 82, ferritin 26.2 03/10/24 GI profile negative 03/10/24 fecal calprotectin  3,330  Previous GI workup:  Imaging: 01/11/24 CT Abd/pelvis IMPRESSION: 1. No acute inflammatory process identified within the  abdomen or pelvis. 2. Multiple other nonacute observations, as described above. 02/07/24 CT abdomen pelvis with contrast IMPRESSION: 1. Mild acute uncomplicated diverticulitis of the sigmoid colon. No perforation, fluid collection, or abscess. 2.  Aortic Atherosclerosis (ICD10-I70.0).  06/13/24 CTAP IMPRESSION: 1. No evidence of active ulcerative colitis. No acute inflammation of the small bowel or colon. Mild sigmoid diverticulosis without evidence acute diverticulitis. 2. No acute findings abdomen pelvis.  Procedures:  Colonoscopy 05/08/2023 for personal history of colon polyps and positive fit test - The examined portion of the ileum was normal.  - Four 3 to 6 mm polyps (tubular adenomas) in the ascending colon and in the cecum, removed with a cold snare. Resected and retrieved.  - Diverticulosis in the sigmoid colon. - One 3 mm polyp (polyploid benign mucosa) in the sigmoid colon, removed with a cold snare. Resected and retrieved. - Non- bleeding external and internal hemorrhoids.  Path: Diagnosis 1. Surgical [P], colon, ascending and cecum, polyp (4) - TUBULAR ADENOMA(S). - NO HIGH GRADE DYSPLASIA OR MALIGNANCY. - POLYPOID FRAGMENT OF BENIGN COLONIC MUCOSA WITH LYMPHOID AGGREGATE 2. Surgical [P], colon, sigmoid, polyp (1) - POLYPOID FRAGMENT OF BENIGN COLONIC MUCOSA WITH LYMPHOID AGGREGATE   Colonoscopy 06/16/2021 with Dr. Eda for personal history of colon polyps - Hemorrhoids found on perianal exam.  - Non- bleeding internal hemorrhoids.  - Diverticulosis in the sigmoid colon and in the descending colon.  - One less than 1 mm polyp in the descending colon, removed with a cold biopsy forceps. Resected and retrieved.  - Three 2 to 3 mm polyps in the ascending colon, removed with a cold snare. Resected and retrieved.  - Two 1 to 2 mm polyps in the cecum, removed with a cold snare. Resected and retrieved.  - The examination was otherwise normal on direct and retroflexion  views. - repeat 3 years Diagnosis 1. Surgical [P], colon, cecum, polyp (2) - TUBULAR ADENOMA WITHOUT HIGH-GRADE DYSPLASIA OR MALIGNANCY - OTHER FRAGMENT OF POLYPOID COLONIC MUCOSA WITH PROMINENT LYMPHOID AGGREGATES 2. Surgical [P], colon, ascending, polyp (3) - TUBULAR ADENOMA(S) WITHOUT HIGH-GRADE DYSPLASIA OR MALIGNANCY 3. Surgical [P], colon, descending, polyp (1) - BENIGN LEIOMYOMA  Assessment:     73 year old female patient with newly diagnosed Ulcerative colitis recently diagnosed on flexible sig April 2025.  Patient initially started on Rowasa  suppositories, but unfortunately patient had issues with using the Rowasa  suppositories despite education on maneuvering them. We switched her to oral mesalamine  and she started to  experience refractory diarrhea within the first week of starting medication, so stopped the medication. Patient placed on 2 months of Budesonide  which helped initially first 6 weeks, then symptoms started reoccurring last 2 weeks of finishing medication.  Fecal calprotectin 3,330 (Hagan Maltz 2025).  Patient started on Tremfya IL 23 and within 24 to 48 hours had allergic reaction with lip and throat swelling.  Patient ended up in ED and placed on prednisone  taper. CT scan (-).  Patient currently on 30 mg of prednisone  p.o. daily, will titrate by 10 mg every week and discontinue.   Patient will be started on Entyvio for the Ulcerative colitis due to anaphylactic reaction to Tremfya .  History of DVT not candidate for Jak Inhibitor. Will recheck labs today. Potassium normal at 4.2.  Plan: - Check hepatic function, lipase, CRP -Continue Prednisone , start 20mg  po daily tomorrow x 1 week, then go down to 10 mg po daily x 1 week, then discontinue -Order sent in for Entyvio, pending insurance -Continue vitamin D  5,000 units OTC -Continue vitamin b 12   Thank you for the courtesy of this consult. Please call me with any questions or concerns.   Llana Deshazo, FNP-[facility] 06/16/2024, 7:07 AM  Cc: Corwin Antu, FNP

## 2024-06-16 NOTE — Patient Instructions (Addendum)
 Continue Prednisone , start 20mg  po daily tomorrow x 1 week, then go down to 10 mg po daily x 1 week then discontinue  Your provider has requested that you go to the basement level for lab work before leaving today. Press B on the elevator. The lab is located at the first door on the left as you exit the elevator.  Due to recent changes in healthcare laws, you may see the results of your imaging and laboratory studies on MyChart before your provider has had a chance to review them.  We understand that in some cases there may be results that are confusing or concerning to you. Not all laboratory results come back in the same time frame and the provider may be waiting for multiple results in order to interpret others.  Please give us  48 hours in order for your provider to thoroughly review all the results before contacting the office for clarification of your results.   _______________________________________________________  If your blood pressure at your visit was 140/90 or greater, please contact your primary care physician to follow up on this.  _______________________________________________________  If you are age 75 or older, your body mass index should be between 23-30. Your Body mass index is 32 kg/m. If this is out of the aforementioned range listed, please consider follow up with your Primary Care Provider.  If you are age 74 or younger, your body mass index should be between 19-25. Your Body mass index is 32 kg/m. If this is out of the aformentioned range listed, please consider follow up with your Primary Care Provider.   ________________________________________________________  The [facility] providers would like to encourage you to use MYCHART to communicate with providers for non-urgent requests or questions.  Due to long hold times on the telephone, sending your provider a message by [facility] may be a faster and more efficient way to get a response.  Please allow 48 business hours  for a response.  Please remember that this is for non-urgent requests.  _______________________________________________________  [facility] is using a team-based approach to care.  Your team is made up of your doctor and two to three APPS. Our APPS (Nurse Practitioners and Physician Assistants) work with your physician to ensure care continuity for you. They are fully qualified to address your health concerns and develop a treatment plan. They communicate directly with your gastroenterologist to care for you. Seeing the Advanced Practice Practitioners on your physician's team can help you by facilitating care more promptly, often allowing for earlier appointments, access to diagnostic testing, procedures, and other specialty referrals.   Thank you for choosing me and [facility].  Deanna May, NP

## 2024-06-16 NOTE — Progress Notes (Signed)
 I agree with the assessment and plan as outlined by Ms. May.

## 2024-06-19 NOTE — Telephone Encounter (Signed)
 Received SMN form from [employer]. Placed in Dr. Lafonda office for review & signature when she returns to office.

## 2024-06-19 NOTE — Telephone Encounter (Signed)
 Per Delon with [facility] they received a denial for the Entyvio & that her PA team will be drafting a SMN for Dr. Federico to review. She will stop by office once it is ready.

## 2024-06-23 ENCOUNTER — Telehealth: Payer: Self-pay | Admitting: *Deleted

## 2024-06-23 NOTE — Telephone Encounter (Signed)
 The patient knows that she was told to take the Eliquis  for 6 months .  She she took the last 1 today and she is just wanting to make sure that this is what we are supposed to do.  She has had no problems of any issues from DVT or anything she is perfectly fine she says.  It is wants to make sure that it is okay she has done the last Eliquis  and just make sure that she not have to be on it anymore.

## 2024-06-24 ENCOUNTER — Observation Stay (HOSPITAL_BASED_OUTPATIENT_CLINIC_OR_DEPARTMENT_OTHER)

## 2024-06-24 ENCOUNTER — Observation Stay (HOSPITAL_COMMUNITY)
Admission: EM | Admit: 2024-06-24 | Discharge: 2024-06-25 | Disposition: A | Discharge status: Still In Hospital | Attending: Emergency Medicine | Admitting: Emergency Medicine

## 2024-06-24 ENCOUNTER — Other Ambulatory Visit: Payer: Self-pay

## 2024-06-24 ENCOUNTER — Emergency Department (HOSPITAL_COMMUNITY)

## 2024-06-24 ENCOUNTER — Encounter (HOSPITAL_COMMUNITY): Payer: Self-pay

## 2024-06-24 DIAGNOSIS — R918 Other nonspecific abnormal finding of lung field: Secondary | ICD-10-CM | POA: Diagnosis not present

## 2024-06-24 DIAGNOSIS — E669 Obesity, unspecified: Secondary | ICD-10-CM | POA: Diagnosis not present

## 2024-06-24 DIAGNOSIS — Z79899 Other long term (current) drug therapy: Secondary | ICD-10-CM | POA: Insufficient documentation

## 2024-06-24 DIAGNOSIS — R7989 Other specified abnormal findings of blood chemistry: Secondary | ICD-10-CM | POA: Diagnosis not present

## 2024-06-24 DIAGNOSIS — R079 Chest pain, unspecified: Secondary | ICD-10-CM | POA: Diagnosis not present

## 2024-06-24 DIAGNOSIS — Z86718 Personal history of other venous thrombosis and embolism: Secondary | ICD-10-CM | POA: Diagnosis not present

## 2024-06-24 DIAGNOSIS — R040 Epistaxis: Secondary | ICD-10-CM | POA: Diagnosis not present

## 2024-06-24 DIAGNOSIS — R519 Headache, unspecified: Secondary | ICD-10-CM | POA: Diagnosis not present

## 2024-06-24 DIAGNOSIS — I6782 Cerebral ischemia: Secondary | ICD-10-CM | POA: Insufficient documentation

## 2024-06-24 DIAGNOSIS — E781 Pure hyperglyceridemia: Secondary | ICD-10-CM | POA: Diagnosis not present

## 2024-06-24 DIAGNOSIS — E039 Hypothyroidism, unspecified: Secondary | ICD-10-CM | POA: Diagnosis not present

## 2024-06-24 DIAGNOSIS — D320 Benign neoplasm of cerebral meninges: Secondary | ICD-10-CM | POA: Diagnosis not present

## 2024-06-24 DIAGNOSIS — I160 Hypertensive urgency: Secondary | ICD-10-CM

## 2024-06-24 DIAGNOSIS — E66811 Obesity, class 1: Secondary | ICD-10-CM | POA: Diagnosis not present

## 2024-06-24 DIAGNOSIS — Z86711 Personal history of pulmonary embolism: Secondary | ICD-10-CM | POA: Diagnosis not present

## 2024-06-24 DIAGNOSIS — R791 Abnormal coagulation profile: Secondary | ICD-10-CM | POA: Diagnosis not present

## 2024-06-24 DIAGNOSIS — I10 Essential (primary) hypertension: Secondary | ICD-10-CM | POA: Diagnosis not present

## 2024-06-24 DIAGNOSIS — Z6830 Body mass index (BMI) 30.0-30.9, adult: Secondary | ICD-10-CM | POA: Insufficient documentation

## 2024-06-24 DIAGNOSIS — I700 Atherosclerosis of aorta: Secondary | ICD-10-CM | POA: Diagnosis not present

## 2024-06-24 LAB — BASIC METABOLIC PANEL WITH GFR
Anion gap: 12 (ref 5–15)
BUN: 14 mg/dL (ref 8–23)
CO2: 25 mmol/L (ref 22–32)
Calcium: 9.2 mg/dL (ref 8.9–10.3)
Chloride: 103 mmol/L (ref 98–111)
Creatinine, Ser: 0.91 mg/dL (ref 0.44–1.00)
GFR, Estimated: >60 mL/min (ref >60)
Glucose, Bld: 105 mg/dL — ABNORMAL HIGH (ref 70–99)
Potassium: 3.7 mmol/L (ref 3.5–5.1)
Sodium: 140 mmol/L (ref 135–145)

## 2024-06-24 LAB — CBC
HCT: 44.9 % (ref 36.0–46.0)
Hemoglobin: 14.6 g/dL (ref 12.0–15.0)
MCH: 30.4 pg (ref 26.0–34.0)
MCHC: 32.5 g/dL (ref 30.0–36.0)
MCV: 93.5 fL (ref 80.0–100.0)
Platelets: 377 K/uL (ref 150–400)
RBC: 4.8 MIL/uL (ref 3.87–5.11)
RDW: 13.9 % (ref 11.5–15.5)
WBC: 8.1 K/uL (ref 4.0–10.5)
nRBC: 0 % (ref 0.0–0.2)

## 2024-06-24 LAB — ECHOCARDIOGRAM COMPLETE
Area-P 1/2: 2.83 cm2
Calc EF: 59.9 %
Height: 63 in
S' Lateral: 2.4 cm
Single Plane A2C EF: 51.9 %
Single Plane A4C EF: 66.5 %
Weight: 2784 [oz_av]

## 2024-06-24 LAB — HEMOGLOBIN AND HEMATOCRIT, BLOOD
HCT: 40.7 % (ref 36.0–46.0)
Hemoglobin: 12.9 g/dL (ref 12.0–15.0)

## 2024-06-24 LAB — TROPONIN I (HIGH SENSITIVITY)
Troponin I (High Sensitivity): 30 ng/L — ABNORMAL HIGH (ref <18)
Troponin I (High Sensitivity): 78 ng/L — ABNORMAL HIGH (ref <18)
Troponin I (High Sensitivity): 45 ng/L — ABNORMAL HIGH (ref <18)
Troponin I (High Sensitivity): 36 ng/L — ABNORMAL HIGH (ref <18)

## 2024-06-24 LAB — PROTIME-INR
INR: 1.2 (ref 0.8–1.2)
Prothrombin Time: 15.6 s — ABNORMAL HIGH (ref 11.4–15.2)

## 2024-06-24 MED ORDER — PREDNISONE 10 MG PO TABS: 10.0000 mg | ORAL_TABLET | Freq: Every day | ORAL | Status: DC

## 2024-06-24 MED ORDER — ONDANSETRON HCL 4 MG/2ML IJ SOLN: 4.0000 mg | Freq: Four times a day (QID) | INTRAMUSCULAR | Status: DC | PRN

## 2024-06-24 MED ORDER — LIDOCAINE-EPINEPHRINE-TETRACAINE (LET) TOPICAL GEL
3.0000 mL | Freq: Once | TOPICAL | Status: AC
  Administered 2024-06-24: 3 mL via TOPICAL
  Filled 2024-06-24: qty 3

## 2024-06-24 MED ORDER — ACETAMINOPHEN 650 MG RE SUPP: 650.0000 mg | Freq: Four times a day (QID) | RECTAL | Status: DC | PRN

## 2024-06-24 MED ORDER — ONDANSETRON HCL 4 MG PO TABS: 4.0000 mg | ORAL_TABLET | Freq: Four times a day (QID) | ORAL | Status: DC | PRN

## 2024-06-24 MED ORDER — AMLODIPINE BESYLATE 5 MG PO TABS
5.0000 mg | ORAL_TABLET | Freq: Once | ORAL | Status: AC
  Administered 2024-06-24: 5 mg via ORAL
  Filled 2024-06-24: qty 1

## 2024-06-24 MED ORDER — DIPHENHYDRAMINE HCL 25 MG PO TABS: 25.0000 mg | ORAL_TABLET | Freq: Four times a day (QID) | ORAL | Status: DC | PRN

## 2024-06-24 MED ORDER — SODIUM CHLORIDE 0.9% FLUSH
3.0000 mL | Freq: Two times a day (BID) | INTRAVENOUS | Status: DC
  Administered 2024-06-24 – 2024-06-25 (×2): 3 mL via INTRAVENOUS

## 2024-06-24 MED ORDER — LEVOTHYROXINE SODIUM 75 MCG PO TABS
75.0000 ug | ORAL_TABLET | Freq: Every day | ORAL | Status: DC
  Administered 2024-06-24 – 2024-06-25 (×2): 75 ug via ORAL
  Filled 2024-06-24 (×2): qty 1

## 2024-06-24 MED ORDER — ACETAMINOPHEN 500 MG PO TABS
1000.0000 mg | ORAL_TABLET | Freq: Once | ORAL | Status: AC
  Administered 2024-06-24: 1000 mg via ORAL
  Filled 2024-06-24: qty 2

## 2024-06-24 MED ORDER — PREDNISONE 10 MG PO TABS
10.0000 mg | ORAL_TABLET | Freq: Every day | ORAL | Status: DC
  Administered 2024-06-24: 10 mg via ORAL
  Filled 2024-06-24: qty 1

## 2024-06-24 MED ORDER — FLUTICASONE PROPIONATE 50 MCG/ACT NA SUSP: 1.0000 | NASAL | Status: DC | PRN

## 2024-06-24 MED ORDER — HYDRALAZINE HCL 20 MG/ML IJ SOLN: 10.0000 mg | INTRAMUSCULAR | Status: DC | PRN

## 2024-06-24 MED ORDER — OXYMETAZOLINE HCL 0.05 % NA SOLN
1.0000 | Freq: Once | NASAL | Status: AC
  Administered 2024-06-24: 1 via NASAL
  Filled 2024-06-24: qty 30

## 2024-06-24 MED ORDER — IOHEXOL 350 MG/ML SOLN
75.0000 mL | Freq: Once | INTRAVENOUS | Status: AC | PRN
  Administered 2024-06-24: 75 mL via INTRAVENOUS

## 2024-06-24 MED ORDER — SILVER NITRATE-POT NITRATE 75-25 % EX MISC
3.0000 | Freq: Once | CUTANEOUS | Status: AC
  Administered 2024-06-24: 3 via TOPICAL
  Filled 2024-06-24: qty 3

## 2024-06-24 MED ORDER — ALBUTEROL SULFATE (2.5 MG/3ML) 0.083% IN NEBU: 2.5000 mg | INHALATION_SOLUTION | Freq: Four times a day (QID) | RESPIRATORY_TRACT | Status: DC | PRN

## 2024-06-24 MED ORDER — ACETAMINOPHEN 325 MG PO TABS
650.0000 mg | ORAL_TABLET | Freq: Four times a day (QID) | ORAL | Status: DC | PRN
Start: 2024-06-24 — End: 2024-06-25
  Administered 2024-06-24: 650 mg via ORAL
  Filled 2024-06-24: qty 2

## 2024-06-24 MED ORDER — EZETIMIBE 10 MG PO TABS
10.0000 mg | ORAL_TABLET | Freq: Every day | ORAL | Status: DC
  Administered 2024-06-24 – 2024-06-25 (×2): 10 mg via ORAL
  Filled 2024-06-24 (×2): qty 1

## 2024-06-24 NOTE — ED Notes (Signed)
 Pt amb to RR, nose mildly bleeding through, gauze provided

## 2024-06-24 NOTE — ED Notes (Signed)
 Hourly rounding complete. Pt alert or resting, no distress noted, offered toileting and diet as appropriate. Side rails up, call light within reach. Pt denies pain or further needs at this time.

## 2024-06-24 NOTE — ED Provider Triage Note (Signed)
 Emergency Medicine Provider Triage Evaluation Note  Nicole Daniels , a 73 y.o. female  was evaluated in triage.  Pt complains of epistaxis from left nare, headache and hypertension.  Patient reports epistaxis began this evening around 11 PM.  Denies history of nose bleeds.  States she takes Eliquis  for blood clot which was in left leg which transition to her long.  States that she took her last dose of Eliquis  today.  She also complains of a headache which is progressively worsened throughout the course of the day.  Denies this being the worst headache of her life.  Denies sudden onset.  Here hypertensive, denies taking blood pressure medication.  States that she has been advised in the past she has high blood pressure but has not taken medication.  Denies chest pain, shortness of breath, blurred vision.  Review of Systems  Positive:  Negative:   Physical Exam  BP (!) 199/100 (BP Location: Right Arm)   Pulse 75   Temp 97.8 F (36.6 C) (Oral)   Resp 16   Ht 5' 3 (1.6 m)   Wt 78.9 kg   SpO2 94%   BMI 30.82 kg/m  Gen:   Awake, no distress   Resp:  Normal effort  MSK:   Moves extremities without difficulty  Other:  Oozing of blood from left nare.  No neurodeficits.  Given Afrin, workup initiated.  Medical Decision Making  Medically screening exam initiated at 1:15 AM.  Appropriate orders placed.  Nicole Daniels was informed that the remainder of the evaluation will be completed by another provider, this initial triage assessment does not replace that evaluation, and the importance of remaining in the ED until their evaluation is complete.   Ruthell Lonni FALCON, PA-C 06/24/24 0116

## 2024-06-24 NOTE — ED Provider Notes (Signed)
 [place] EMERGENCY DEPARTMENT AT [facility] Provider Note   CSN: [id number] Arrival date & time: 06/24/24  0004     Patient presents with: Epistaxis   Nicole Daniels is a 73 y.o. female with history of DVT progressing to PE on Eliquis , thyroid  disease, ulcerative colitis, hemorrhoids.  Patient presents to ED for evaluation of nosebleed.  States that around 11 PM tonight she developed a nosebleed from her left nare.  Denies history of nosebleeds.  States that she is taking blood thinners for PE and finished her last dose of Eliquis  today.  Also here hypertensive at 199/100.  Denies blurred vision, shortness of breath.  Does endorse a little bit of chest pain which she states is been ongoing since being diagnosed with a blood clot.  Reports she also has a headache that stated this morning but states it is not worse headache of her life, denies sudden onset.  Denies one-sided weakness or numbness, blurred vision, nausea, vomiting.  Denies history of high blood pressure.   Epistaxis Associated symptoms: headaches        Prior to Admission medications   Medication Sig Start Date End Date Taking? Authorizing Provider  acetaminophen  (TYLENOL ) 325 MG tablet Take 650 mg by mouth every 6 (six) hours as needed.    [provider]  albuterol  (VENTOLIN  HFA) 108 (90 Base) MCG/ACT inhaler Inhale 2 puffs into the lungs every 6 (six) hours as needed for wheezing or shortness of breath. 12/12/23   Bedsole, Amy E, MD  apixaban  (ELIQUIS ) 5 MG TABS tablet Take 1 tablet (5 mg total) by mouth 2 (two) times daily. 01/23/24   Jacobo Evalene PARAS, MD  chlorpheniramine  (ALLERGY RELIEF) 4 MG tablet Take 1 tablet (4 mg total) by mouth 2 (two) times daily as needed for allergies. 10/11/23   Corwin Antu, FNP  Cholecalciferol  (VITAMIN D3) 2000 units TABS Take 2,000 Units by mouth daily.    [provider]  diphenhydrAMINE  (BENADRYL ) 50 MG/ML injection as needed. 06/02/24   [provider]  ezetimibe  (ZETIA ) 10 MG tablet Take 1 tablet (10 mg total) by mouth daily. 11/07/23   Dugal, Tabitha, FNP  fluticasone  (FLONASE ) 50 MCG/ACT nasal spray Place 1 spray into both nostrils as needed for allergies.     [provider]  levothyroxine  (SYNTHROID ) 75 MCG tablet Take 1 tablet (75 mcg total) by mouth daily. 11/07/23   Dugal, Tabitha, FNP  meclizine  (ANTIVERT ) 12.5 MG tablet Take 1 tablet (12.5 mg total) by mouth 3 (three) times daily as needed for dizziness. 03/21/23   Corwin Antu, FNP  ondansetron  (ZOFRAN -ODT) 4 MG disintegrating tablet Take 1 tablet (4 mg total) by mouth every 8 (eight) hours as needed for nausea or vomiting. 03/05/24   May, Deanna J, NP    Allergies: Simvastatin, Statins, Bactrim [sulfamethoxazole -trimethoprim], and Sertraline     Review of Systems  HENT:  Positive for nosebleeds.   Cardiovascular:  Positive for chest pain.  Neurological:  Positive for headaches.  All other systems reviewed and are negative.   Updated Vital Signs BP (!) 179/90   Pulse 64   Temp 97.8 F (36.6 C) (Oral)   Resp 16   Ht 5' 3 (1.6 m)   Wt 78.9 kg   SpO2 97%   BMI 30.82 kg/m   Physical Exam Vitals and nursing note reviewed.  Constitutional:      General: She is not in acute distress.    Appearance: She is well-developed.  HENT:  Head: Normocephalic and atraumatic.  Eyes:     Conjunctiva/sclera: Conjunctivae normal.  Cardiovascular:     Rate and Rhythm: Normal rate and regular rhythm.     Heart sounds: No murmur heard. Pulmonary:     Effort: Pulmonary effort is normal. No respiratory distress.     Breath sounds: Normal breath sounds.  Abdominal:     Palpations: Abdomen is soft.     Tenderness: There is no abdominal tenderness.  Musculoskeletal:        General: No swelling.     Cervical back: Neck supple.  Skin:    General: Skin is warm and dry.     Capillary Refill: Capillary refill takes less than 2 seconds.  Neurological:     General:  No focal deficit present.     Mental Status: She is alert and oriented to person, place, and time. Mental status is at baseline.     GCS: GCS eye subscore is 4. GCS verbal subscore is 5. GCS motor subscore is 6.     Cranial Nerves: Cranial nerves 2-12 are intact. No cranial nerve deficit.     Sensory: Sensation is intact. No sensory deficit.     Motor: Motor function is intact. No weakness.     Comments: CN III through XII intact.  Intact finger-nose, heel-to-shin.  No pronator drift.  No slurred speech.  Equal strength throughout.  Equal sensation throughout.  Tracks cross midline.  PERRL.  Alert and oriented x 4.  Psychiatric:        Mood and Affect: Mood normal.     (all labs ordered are listed, but only abnormal results are displayed) Labs Reviewed  BASIC METABOLIC PANEL WITH GFR - Abnormal; Notable for the following components:      Result Value   Glucose, Bld 105 (*)    All other components within normal limits  PROTIME-INR - Abnormal; Notable for the following components:   Prothrombin Time 15.6 (*)    All other components within normal limits  TROPONIN I (HIGH SENSITIVITY) - Abnormal; Notable for the following components:   Troponin I (High Sensitivity) 30 (*)    All other components within normal limits  CBC  TROPONIN I (HIGH SENSITIVITY)    EKG: None  Radiology: CT Head Wo Contrast Result Date: 06/24/2024 CLINICAL DATA:  Headaches EXAM: CT HEAD WITHOUT CONTRAST TECHNIQUE: Contiguous axial images were obtained from the base of the skull through the vertex without intravenous contrast. RADIATION DOSE REDUCTION: This exam was performed according to the departmental dose-optimization program which includes automated exposure control, adjustment of the mA and/or kV according to patient size and/or use of iterative reconstruction technique. COMPARISON:  07/05/2021 FINDINGS: Brain: No evidence of acute infarction, hemorrhage, hydrocephalus, extra-axial collection or mass  lesion/mass effect. Chronic white matter ischemic changes noted. Small calcified lesion is noted along the falx superiorly likely representing a small meningioma. Vascular: No hyperdense vessel or unexpected calcification. Skull: Normal. Negative for fracture or focal lesion. Sinuses/Orbits: No acute finding. Other: None. IMPRESSION: No acute intracranial abnormality noted. Chronic white matter ischemic changes. Small meningioma. Electronically Signed   By: Oneil Devonshire M.D.   On: 06/24/2024 01:44    Procedures   Medications Ordered in the ED  lidocaine -EPINEPHrine -tetracaine  (LET) topical gel (has no administration in time range)  oxymetazoline  (AFRIN) 0.05 % nasal spray 1 spray (1 spray Each Nare Given 06/24/24 0238)  oxymetazoline  (AFRIN) 0.05 % nasal spray 1 spray (1 spray Each Nare Given 06/24/24 0237)  acetaminophen  (TYLENOL ) tablet  1,000 mg (1,000 mg Oral Given 06/24/24 0236)  amLODipine  (NORVASC ) tablet 5 mg (5 mg Oral Given 06/24/24 0236)  iohexol  (OMNIPAQUE ) 350 MG/ML injection 75 mL (75 mLs Intravenous Contrast Given 06/24/24 0610)    Clinical Course as of 06/24/24 0629  Tue Jun 24, 2024  0428 Nose continues to bleed from L nare. Packed with LET gel, going to apply rhino rocket [CG]    Clinical Course User Index [CG] Ruthell Lonni FALCON, PA-C   Medical Decision Making Amount and/or Complexity of Data Reviewed Labs: ordered. Radiology: ordered. ECG/medicine tests: ordered.  Risk OTC drugs. Prescription drug management.   This is a 73 year old female who just finished Eliquis  presenting to the ED out of concern of nosebleed, high blood pressure and chest pain.  On arrival, she is afebrile, nontachycardic.  Lung sounds are clear bilaterally, no hypoxia.  Abdomen soft and compressible.  Neurological examination at baseline.  Blood pressure 199/100.  Patient labwork initiated in triage include CBC, BMP, troponin x 2, PT/INR, CT head.  Patient CBC without leukocytosis or anemia.   Metabolic panel grossly unremarkable.  Troponin 30, delta pending.  PT/INR with elevated prothrombin time.  CT head unremarkable.  Patient had Afrin applied to gauze and this was then inserted into her left nare.  Patient left nare continued to bleed despite Afrin.  Let gel was applied to a gauze ball and her nose was stuffed and then [employer] was applied by attending Dr. Griselda.  For patient blood pressure, she was given 5 mg amlodipine .  Blood pressure currently 179/90.  Patient chart reviewed and patient does appear to have high blood pressure on office visits.  Patient is pending delta troponin, CTA at this time.  Recently finished Eliquis  for PE. Signed out to [facility] pending further workup. Plan of management discussed.     Final diagnoses:  Left-sided epistaxis  Hypertension, unspecified type    ED Discharge Orders     None          Ruthell Lonni FALCON DEVONNA 06/24/24 0630    Griselda Norris, MD 06/24/24 [phone]

## 2024-06-24 NOTE — Consult Note (Signed)
 ENT CONSULT:  Reason for Consult: Epistaxis  Referring Physician:  Dr. Griselda  HPI: Nicole Daniels is an 73 y.o. female who presented to ED with complaints of persistent epistaxis..  Patient has history of DVT/PE, chronically anticoagulated on Eliquis .  While in the ED, patient was noted to have significant hypertension, and ED provider packed patient with Rhino Rocket, but was noted to have persistent epistaxis despite nasal packing.  ENT was consulted for further evaluation.  Patient denies history of recurrent epistaxis, no history of preceding nasal trauma or injury.   Past Medical History:  Diagnosis Date   Abdominal pain, right lower quadrant    Acquired cyst of kidney 01/08/2012   pt denies history of    Acute sinusitis, unspecified    Adenomatous colon polyp    Allergy    Angiomyolipoma    kidney   Anxiety    OCCASIONAL   Cataract    Chest pain    Chest pain, unspecified    Depressive disorder, not elsewhere classified 01/08/2012   not currently    Diverticulosis    Dizziness and giddiness    DVT (deep venous thrombosis) (HCC)    Dysfunction of eustachian tube    H/O Spinal surgery 02/20/2017   Hemorrhoids    History of diverticulitis of colon    History of fusion of cervical spine 04/25/2018   IBS (irritable bowel syndrome)    Liver lesion 2011   Neoplasm of skin of face    malignant   OA (osteoarthritis)    knee, NECK   Obesity, unspecified    Ostium secundum type atrial septal defect    Other and unspecified hyperlipidemia    Other malaise and fatigue    Palpitations    Pulmonary embolism (HCC)    Shortness of breath    Thyroid  disease    Ulcerative colitis (HCC)    Unspecified essential hypertension    pt states she does not have HTN   Unspecified sinusitis (chronic)    Unspecified vitamin D  deficiency     Past Surgical History:  Procedure Laterality Date   ABDOMINAL HYSTERECTOMY     BREAST REDUCTION SURGERY  1990   CERVICAL DISC SURGERY      Dr.Nudleman   CHOLECYSTECTOMY     COLONOSCOPY     DILATION AND CURETTAGE OF UTERUS     EYE SURGERY Bilateral 07/30/2017   cataract sx   FLEXIBLE SIGMOIDOSCOPY  01/08/2012   Procedure: FLEXIBLE SIGMOIDOSCOPY;  Surgeon: Princella CHRISTELLA Nida, MD;  Location: [facility];  Service: Endoscopy;  Laterality: N/A;   HEMORRHOID SURGERY     KNEE ARTHROTOMY     pt states no    LAMINECTOMY N/A 01/31/2017   Procedure: Thoracic Laminectomy and marsupialization of arachnoid cyst;  Surgeon: Lamar Peaches, MD;  Location: [facility] OR;  Service: Neurosurgery;  Laterality: N/A;   LEFT OOPHORECTOMY  1976   NECK SURGERY N/A 03/2018   PARTIAL HYSTERECTOMY     right ovary remains   REDUCTION MAMMAPLASTY     TONSILLECTOMY  1972   UPPER GASTROINTESTINAL ENDOSCOPY      Family History  Problem Relation Age of Onset   Hypertension Mother    Diabetes Mother    Cirrhosis Father    Heart disease Father    Hypertension Sister    Diabetes Sister    Heart disease Brother        x 2   Breast cancer Cousin 10 - 54   Diabetes Cousin    Diabetes  Other        aunt   Stroke Other        aunt   Other Son        brain tumor, age 41   Colon cancer Neg Hx    Esophageal cancer Neg Hx    Stomach cancer Neg Hx    Rectal cancer Neg Hx    Colon polyps Neg Hx     Social History:  reports that she has never smoked. She has never used smokeless tobacco. She reports that she does not drink alcohol and does not use drugs.  Allergies:  Allergies  Allergen Reactions   Simvastatin Other (See Comments)    MYALGIAS   Statins Other (See Comments)    Has severe leg muscle aches and cramps   Bactrim [Sulfamethoxazole -Trimethoprim] Itching   Sertraline  Anxiety    Medications: I have reviewed the patient's current medications.  Results for orders placed or performed during the hospital encounter of 06/24/24 (from the past 48 hours)  CBC     Status: None   Collection Time: 06/24/24  1:26 AM  Result Value Ref Range   WBC 8.1  4.0 - 10.5 K/uL   RBC 4.80 3.87 - 5.11 MIL/uL   Hemoglobin 14.6 12.0 - 15.0 g/dL   HCT 55.0 63.9 - 53.9 %   MCV 93.5 80.0 - 100.0 fL   MCH 30.4 26.0 - 34.0 pg   MCHC 32.5 30.0 - 36.0 g/dL   RDW 86.0 88.4 - 84.4 %   Platelets 377 150 - 400 K/uL   nRBC 0.0 0.0 - 0.2 %    Comment: Performed at [facility], [address], [place], [place] [address]  Basic metabolic panel     Status: Abnormal   Collection Time: 06/24/24  1:26 AM  Result Value Ref Range   Sodium 140 135 - 145 mmol/L   Potassium 3.7 3.5 - 5.1 mmol/L   Chloride 103 98 - 111 mmol/L   CO2 25 22 - 32 mmol/L   Glucose, Bld 105 (H) 70 - 99 mg/dL    Comment: Glucose reference range applies only to samples taken after fasting for at least 8 hours.   BUN 14 8 - 23 mg/dL   Creatinine, Ser 9.08 0.44 - 1.00 mg/dL   Calcium  9.2 8.9 - 10.3 mg/dL   GFR, Estimated >39 >39 mL/min    Comment: (NOTE) Calculated using the CKD-EPI Creatinine Equation (2021)    Anion gap 12 5 - 15    Comment: Performed at [facility], [address], [place], [place] [address]  Protime-INR     Status: Abnormal   Collection Time: 06/24/24  1:26 AM  Result Value Ref Range   Prothrombin Time 15.6 (H) 11.4 - 15.2 seconds   INR 1.2 0.8 - 1.2    Comment: (NOTE) INR goal varies based on device and disease states. Performed at [facility], [address] [place], [place] [address]   Troponin I (High Sensitivity)     Status: Abnormal   Collection Time: 06/24/24  1:26 AM  Result Value Ref Range   Troponin I (High Sensitivity) 30 (H) <18 ng/L    Comment: (NOTE) Elevated high sensitivity troponin I (hsTnI) values and significant  changes across serial measurements may suggest ACS but many other  chronic and acute conditions are known to elevate hsTnI results.  Refer to the Links section for chest pain algorithms and additional  guidance. Performed at [facility], [address], [place], [place] [address]   Troponin I (High  Sensitivity)     Status: Abnormal   Collection Time: 06/24/24  5:34 AM  Result Value Ref Range   Troponin I (High Sensitivity) 78 (H) <18 ng/L    Comment: FIRST CALL ATTEMPT AT 0630 RESULT CALLED TO, READ BACK BY AND VERIFIED WITH R. TAUL, RN AT [phone] 08.26.25 JLASIGAN (NOTE) Elevated high sensitivity troponin I (hsTnI) values and significant  changes across serial measurements may suggest ACS but many other  chronic and acute conditions are known to elevate hsTnI results.  Refer to the Links section for chest pain algorithms and additional  guidance. Performed at [facility], [address], [place], [place] [address]     CT Angio Chest PE W and/or Wo Contrast Result Date: 06/24/2024 CLINICAL DATA:  PE suspected Eliquis  for known left DVT, chest pain and epistaxis EXAM: CT ANGIOGRAPHY CHEST WITH CONTRAST TECHNIQUE: Multidetector CT imaging of the chest was performed using the standard protocol during bolus administration of intravenous contrast. Multiplanar CT image reconstructions and MIPs were obtained to evaluate the vascular anatomy. RADIATION DOSE REDUCTION: This exam was performed according to the departmental dose-optimization program which includes automated exposure control, adjustment of the mA and/or kV according to patient size and/or use of iterative reconstruction technique. CONTRAST:  75mL OMNIPAQUE  IOHEXOL  350 MG/ML SOLN COMPARISON:  12/24/2023 FINDINGS: Card Cardiovascular: Satisfactory opacification of the pulmonary arteries to the segmental level. No evidence of pulmonary embolism. Normal heart size. No pericardial effusion. Aortic atherosclerosis. Mediastinum/Nodes: No enlarged mediastinal, hilar, or axillary lymph nodes. Thyroid  gland, trachea, and esophagus demonstrate no significant findings. Lungs/Pleura: Diffuse bilateral bronchial wall thickening. Mosaic attenuation of the airspaces. No pleural effusion or pneumothorax. Upper Abdomen: No acute abnormality.  Cholecystectomy. Hyperdense bilateral renal lesions, previously characterized as benign hemorrhagic or proteinaceous cysts by dedicated abdominal imaging. Musculoskeletal: No chest wall abnormality. No acute osseous findings. Review of the MIP images confirms the above findings. IMPRESSION: 1. Negative examination for pulmonary embolism. 2. Diffuse bilateral bronchial wall thickening and mosaic attenuation of the airspaces, consistent with nonspecific infectious or inflammatory bronchitis and small airways disease. Aortic Atherosclerosis (ICD10-I70.0). Electronically Signed   By: Marolyn JONETTA Jaksch M.D.   On: 06/24/2024 07:17   CT Head Wo Contrast Result Date: 06/24/2024 CLINICAL DATA:  Headaches EXAM: CT HEAD WITHOUT CONTRAST TECHNIQUE: Contiguous axial images were obtained from the base of the skull through the vertex without intravenous contrast. RADIATION DOSE REDUCTION: This exam was performed according to the departmental dose-optimization program which includes automated exposure control, adjustment of the mA and/or kV according to patient size and/or use of iterative reconstruction technique. COMPARISON:  07/05/2021 FINDINGS: Brain: No evidence of acute infarction, hemorrhage, hydrocephalus, extra-axial collection or mass lesion/mass effect. Chronic white matter ischemic changes noted. Small calcified lesion is noted along the falx superiorly likely representing a small meningioma. Vascular: No hyperdense vessel or unexpected calcification. Skull: Normal. Negative for fracture or focal lesion. Sinuses/Orbits: No acute finding. Other: None. IMPRESSION: No acute intracranial abnormality noted. Chronic white matter ischemic changes. Small meningioma. Electronically Signed   By: Oneil Devonshire M.D.   On: 06/24/2024 01:44    ROS:ROS  Blood pressure (!) 139/59, pulse 75, temperature 97.9 F (36.6 C), resp. rate (!) 24, height 5' 3 (1.6 m), weight 78.9 kg, SpO2 95%.  PHYSICAL EXAM: CONSTITUTIONAL: well  developed, nourished, no distress  PULMONARY/CHEST WALL: effort normal and no stridor, no stertor, no dysphonia HENT: Head :  normocephalic and atraumatic Ears: Right ear:   canal normal, external ear normal and hearing normal Left ear:   canal normal, external ear normal and hearing normal Nose: Following removal of nasal packing, brisk bleeding was noted from the left caudal septum.  No evidence of epistaxis from the right septum. Mouth/Throat:  Mouth: uvula midline and no oral lesions Throat: oropharynx clear and moist Mucous membranes: normal EYES: conjunctiva normal, EOM normal and PERRL NECK: supple, trachea normal and no thyromegaly or cervical LAD  Studies Reviewed:None  PROCEDURE: CONTROL OF ANTERIOR EPISTAXIS WITH SILVER  NITRATE Patient's caudal septum on the left was topically anesthetized with 4% lidocaine  applied on the cottonball.  After achievement of adequate anesthesia, this was removed and the area was inspected.  2 areas of brisk bleeding were noted along the left caudal septum, which were effectively cauterized with silver  nitrate.  Following this, a small amount of persistent oozing was noted, and a 5.5 Rhino Rocket was then placed in the left nare.  Following this, no persistent bleeding was noted.  Oropharynx was inspected, with no evidence of active bleeding.    Assessment/Plan: Nicole Daniels is a 73 year old female with history of DVT, hypertension with hypertensive urgency, elevated troponin, chronically anticoagulated on Eliquis  with persistent epistaxis despite ED provider placing Rhino Rocket in left nare.  Following removal of nasal packing, patient was noted to have brisk bleeding from the left caudal septum, which was effectively cauterized with silver  nitrate.  Following this, slight persistent oozing was noted, and decision was made to place an anterior Rhino Rocket to help facilitate complete hemostasis.  Patient tolerated procedure well.  Patient is  planned to be admitted to hospital medicine for further monitoring.  Recommend follow-up with ENT in 5 days for nasal packing removal.  Counseled patient to avoid nose blowing, heavy lifting, prolonged bending over while nasal packing is in place.  Anticoagulation as per primary team.  Thank you for allowing me to participate in the care of this patient. Please do not hesitate to contact me with any questions or concerns.   Gerard DELENA Shope, DO Otolaryngology [facility] ENT Cell: [phone]   06/24/2024, 12:58 PM    ICD10: R04.0 Medical Decision Making: #/Complex Problems: 3                    Data Reviewed:2                   Management:3 (1-Straightforward, 2-Low, 3-Moderate, 4-High)

## 2024-06-24 NOTE — ED Notes (Signed)
 CCMD notified of room change

## 2024-06-24 NOTE — H&P (Signed)
 History and Physical    Patient: Nicole Daniels FMW:[id number] DOB: 1950-11-04 DOA: 06/24/2024 DOS: the patient was seen and examined on 06/24/2024 PCP: Corwin Antu, FNP  Patient coming from: Home  Chief Complaint:  Chief Complaint  Patient presents with   Epistaxis   HPI: Nicole Daniels is a 73 y.o. female with medical history significant of hypertension, hyperlipidemia, DVT/PE on Eliquis , IBS, hypothyroidism, anxiety, depression, history of skin cancer s/p removal, and obesity presents with persistent nose bleeding. She is accompanied by her sister, Nicole Daniels, and her best friend, Nicole Daniels.  She has been experiencing persistent bleeding that began last night at around 11:10 PM, described as severe. Previous nosebleeds episodes had typically lasting only a few minutes before resolving.  However, patient reports this episode continued bleeding from her left nare.  She reported associated symptoms of lightheadedness and dizziness.  She has a history of a blood clot in the leg from March and had on Eliquis  for six months, with the last dose taken last night at 8:30 PM. The cause of the blood clot was not determined, but she mentioned being inactive for about a week in February due to a possible COVID-19/ flu infection which was thought possibly related.  She did follow-up with hematology and it appeared that they had recommended that she could stop the medication.  Family notes that the patient became very upset last night when she was unable to get the bleeding to stop.  Patient reported a remote history of being on blood pressure medications, but had not been on them in quite some time.  A cardiac CT in 2021 showed a zero calcium  score.   In the emergency department patient was noted to have blood pressures elevated up to 199/100, and all other vital signs maintained.  Labs significant for high-sensitivity troponin 30-> 78 and stable hemoglobin of 14.6 CT scan of the head was obtained which  showed no acute intercranial abnormality and chronic white matter ischemic changes.  CT angiogram of the chest was obtained and negative for pulmonary embolism but noted diffuse bronchial tree thickening and mosaic attenuation consistent with nonspecific infectious or inflammatory bronchitis and small airway disease.  Nare was packed with a Rhino Rocket, but bleeding persisted.  ENT was consulted.  Review of Systems: As mentioned in the history of present illness. All other systems reviewed and are negative. Past Medical History:  Diagnosis Date   Abdominal pain, right lower quadrant    Acquired cyst of kidney 01/08/2012   pt denies history of    Acute sinusitis, unspecified    Adenomatous colon polyp    Allergy    Angiomyolipoma    kidney   Anxiety    OCCASIONAL   Cataract    Chest pain    Chest pain, unspecified    Depressive disorder, not elsewhere classified 01/08/2012   not currently    Diverticulosis    Dizziness and giddiness    DVT (deep venous thrombosis) (HCC)    Dysfunction of eustachian tube    H/O Spinal surgery 02/20/2017   Hemorrhoids    History of diverticulitis of colon    History of fusion of cervical spine 04/25/2018   IBS (irritable bowel syndrome)    Liver lesion 2011   Neoplasm of skin of face    malignant   OA (osteoarthritis)    knee, NECK   Obesity, unspecified    Ostium secundum type atrial septal defect    Other and unspecified hyperlipidemia    Other  malaise and fatigue    Palpitations    Pulmonary embolism (HCC)    Shortness of breath    Thyroid  disease    Ulcerative colitis (HCC)    Unspecified essential hypertension    pt states she does not have HTN   Unspecified sinusitis (chronic)    Unspecified vitamin D  deficiency    Past Surgical History:  Procedure Laterality Date   ABDOMINAL HYSTERECTOMY     BREAST REDUCTION SURGERY  1990   CERVICAL DISC SURGERY     Dr.Nudleman   CHOLECYSTECTOMY     COLONOSCOPY     DILATION AND CURETTAGE  OF UTERUS     EYE SURGERY Bilateral 07/30/2017   cataract sx   FLEXIBLE SIGMOIDOSCOPY  01/08/2012   Procedure: FLEXIBLE SIGMOIDOSCOPY;  Surgeon: Princella CHRISTELLA Nida, MD;  Location: [facility];  Service: Endoscopy;  Laterality: N/A;   HEMORRHOID SURGERY     KNEE ARTHROTOMY     pt states no    LAMINECTOMY N/A 01/31/2017   Procedure: Thoracic Laminectomy and marsupialization of arachnoid cyst;  Surgeon: Lamar Peaches, MD;  Location: [facility] OR;  Service: Neurosurgery;  Laterality: N/A;   LEFT OOPHORECTOMY  1976   NECK SURGERY N/A 03/2018   PARTIAL HYSTERECTOMY     right ovary remains   REDUCTION MAMMAPLASTY     TONSILLECTOMY  1972   UPPER GASTROINTESTINAL ENDOSCOPY     Social History:  reports that she has never smoked. She has never used smokeless tobacco. She reports that she does not drink alcohol and does not use drugs.  Allergies  Allergen Reactions   Simvastatin Other (See Comments)    MYALGIAS   Statins Other (See Comments)    Has severe leg muscle aches and cramps   Bactrim [Sulfamethoxazole -Trimethoprim] Itching   Sertraline  Anxiety    Family History  Problem Relation Age of Onset   Hypertension Mother    Diabetes Mother    Cirrhosis Father    Heart disease Father    Hypertension Sister    Diabetes Sister    Heart disease Brother        x 2   Breast cancer Cousin 40 - 41   Diabetes Cousin    Diabetes Other        aunt   Stroke Other        aunt   Other Son        brain tumor, age 44   Colon cancer Neg Hx    Esophageal cancer Neg Hx    Stomach cancer Neg Hx    Rectal cancer Neg Hx    Colon polyps Neg Hx     Prior to Admission medications   Medication Sig Start Date End Date Taking? Authorizing Provider  acetaminophen  (TYLENOL ) 325 MG tablet Take 650 mg by mouth every 6 (six) hours as needed.   Yes [provider]  apixaban  (ELIQUIS ) 5 MG TABS tablet Take 1 tablet (5 mg total) by mouth 2 (two) times daily. 01/23/24  Yes Jacobo Evalene PARAS, MD   chlorpheniramine  (ALLERGY RELIEF) 4 MG tablet Take 1 tablet (4 mg total) by mouth 2 (two) times daily as needed for allergies. 10/11/23  Yes Dugal, Ginger, FNP  Cholecalciferol  (VITAMIN D3) 50 MCG (2000 UT) capsule Take 2,000 Units by mouth every other day.   Yes [provider]  cyanocobalamin  (VITAMIN B12) 500 MCG tablet Take 500 mcg by mouth daily.   Yes [provider]  diphenhydrAMINE  (DIPHENHIST) 25 mg capsule Take 25 mg by mouth  every 6 (six) hours as needed (Prior to infusion).   Yes [provider]  ezetimibe  (ZETIA ) 10 MG tablet Take 1 tablet (10 mg total) by mouth daily. 11/07/23  Yes Dugal, Tabitha, FNP  fluticasone  (FLONASE ) 50 MCG/ACT nasal spray Place 1 spray into both nostrils as needed for allergies.    Yes [provider]  levothyroxine  (SYNTHROID ) 75 MCG tablet Take 1 tablet (75 mcg total) by mouth daily. 11/07/23  Yes Corwin Antu, FNP  predniSONE  (DELTASONE ) 10 MG tablet Take 10 mg by mouth daily with breakfast.   Yes [provider]    Physical Exam: Vitals:   06/24/24 0056 06/24/24 0236 06/24/24 0343 06/24/24 0659  BP:  (!) 178/92 (!) 179/90 (!) 144/109  Pulse:   64 97  Resp:   16 16  Temp:   97.8 F (36.6 C) 97.9 F (36.6 C)  TempSrc:   Oral   SpO2:   97% 97%  Weight: 78.9 kg     Height: 5' 3 (1.6 m)      Constitutional: Elderly female who appears to be in no acute distress Eyes: PERRL, lids and conjunctivae normal ENMT: Mucous membranes are moist.  Nasal packing of the left nare present with bloody drainage present Neck: normal, supple  Respiratory: clear to auscultation bilaterally, no wheezing, no crackles. Normal respiratory effort. No accessory muscle use.  Cardiovascular: Regular rate and rhythm, no murmurs / rubs / gallops. No extremity edema.   Abdomen: no tenderness, no masses palpated.  Musculoskeletal: no clubbing / cyanosis. No joint deformity upper and lower extremities. Good ROM, no contractures.  Normal muscle tone.  Skin: no rashes, lesions, ulcers. No induration Neurologic: CN 2-12 grossly intact.   Strength 5/5 in all 4.  Psychiatric: Normal judgment and insight. Alert and oriented x 3. Normal mood.   Data Reviewed:  EKG revealed sinus rhythm at 61 bpm with abnormal R wave progression and left ventricular hypertrophy.  Reviewed labs, imaging, and pertinent records as documented.  Assessment and Plan:  Epistaxis Patient presents with complaints of persistent nosebleed.  He has been on Eliquis  until last night for history of DVT.  Initial hemoglobin noted to be stable at 14.6. - Admit to a medical telemetry bed - Continue nasal packing - Recheck H&H - Appreciate ENT consultative services, will follow-up for any further recommendations  Elevated troponin High-sensitivity troponin 30-> 78.  Patient with prior coronary CTA which noted coronary calcium  score of 0 back in 2021.  Question of symptoms secondary to demand in the setting of patient being acutely distressed due to symptoms. - Continue to trend cardiac troponin - Check echocardiogram - Consider need of formal consult to cardiology if echocardiogram noted to be abnormal  Hypertensive urgency On admission blood pressures noted to be elevated up to 199/100.  Patient reports currently not being on any blood pressure medications for treatment. - Hydralazine  IV as needed for elevated blood pressures  History of DVT/PE Patient has suffered a left lower extremity DVT in pulmonary embolism back in 11/2022.  She had been on anticoagulation of Eliquis  since that time. Review of records note she followed up with hematology Dr. Jacobo on 3/26 and had hypercoagulable workup which was noted to have elevated factor VIII level.  However,  in the setting of acute blood clot and anticoagulation it was thought to be insignificant.  Blood clot was thought to possibly be provoked in the setting of viral illness for which she was recommended to  complete 6 months of  treatment at the end of August.  Last DVT study from 01/17/2024 still showed concerns for clot in the left leg. - Patient was felt not to require any further anticoagulation past 6 months  Hypertriglyceridemia Last lipid panel from 10/11/2023 noted total triglycerides elevated at 256 - Continue Zetia   Hypothyroidism - Continue Levothyroxine   Obesity BMI 30.82 kg/m  DVT prophylaxis: TED hose given concern for bleeding and history of DVT Advance Care Planning:   Code Status: Full Code   Consults: ENT  Family Communication: Family updated at bedside  Severity of Illness: The appropriate patient status for this patient is OBSERVATION. Observation status is judged to be reasonable and necessary in order to provide the required intensity of service to ensure the patient's safety. The patient's presenting symptoms, physical exam findings, and initial radiographic and laboratory data in the context of their medical condition is felt to place them at decreased risk for further clinical deterioration. Furthermore, it is anticipated that the patient will be medically stable for discharge from the hospital within 2 midnights of admission.   Author: Maximino DELENA Sharps, MD 06/24/2024 8:00 AM  For on call review [email or web address].

## 2024-06-24 NOTE — ED Provider Notes (Signed)
 Epistaxis Management  Date/Time: 06/24/2024 5:22 AM  Performed by: Griselda Norris, MD Authorized by: Griselda Norris, MD   Consent:    Consent obtained:  Verbal   Consent given by:  Patient   Risks discussed:  Bleeding, infection, nasal injury and pain   Alternatives discussed:  Alternative treatment Universal protocol:    Patient identity confirmed:  Verbally with patient Anesthesia:    Anesthesia method:  Topical application   Topical anesthetic:  LET Procedure details:    Treatment site:  L posterior   Repair method: Rapid rhino large.   Treatment episode: initial   Post-procedure details:    Procedure completion:  Tolerated well, no immediate complications     Griselda Norris, MD 06/24/24 [phone]

## 2024-06-24 NOTE — ED Notes (Signed)
 PT'S TROP 78, PROVIDER MADE AWARE

## 2024-06-24 NOTE — ED Notes (Signed)
 CCMD called.

## 2024-06-24 NOTE — ED Notes (Signed)
Patient ambulated to bathroom well.

## 2024-06-24 NOTE — ED Notes (Signed)
 Pt eating dinner independently, nose bleed controlled at this time

## 2024-06-24 NOTE — ED Notes (Signed)
 Pt called out for nose beginning to bleed again. This nurse observed small amount of bright red blood on gauze but no active leaking of nose. PA Groce advised and he reported to bedside.

## 2024-06-24 NOTE — ED Notes (Signed)
 Hourly rounding complete. Pt alert or resting, no distress noted, offered toileting and diet as appropriate. Side rails up, call light within reach. Pt denies pain or further needs at this time.   ENT placed Rhino Rocket bleeding better controlled. Pt sitting with family at bedside

## 2024-06-24 NOTE — ED Notes (Signed)
Pt amb to RR.

## 2024-06-24 NOTE — ED Provider Notes (Signed)
 6:42 AM Care assumed at shift change from Groce, PA-C. In short, 73 y/o female on Eliquis  for hx of VTE. Was supposed to be off anticoagulation as of this AM following 6 month course. Presenting with epistaxis of the L nare. Packed by overnight MD soaked in LET after unsuccessful temporization with Afrin soaked gauze; now with some blood in the posterior oropharynx and R nare. Concern for posterior bleed.  Delta troponin rising. Unclear significance, but EKG reassuring. States that chest pain is vague and has been present since her diagnosis of PE. Repeat CTA pending. Anticipate admission for troponin trending. Pending call back from ENT regarding epistaxis management.  7:34 AM Spoke with ENT who will see patient this AM for epistaxis management. Anticipated to be in the hospital around 10AM.   CTA without evidence of recurrent PE. Will consult hospitalist regarding admission for rising troponin.  8:45 AM Patient reassessed. She appears stable. No acute complaints. Epistaxis appears to have stopped; rapid rhino remains in place.   Keith Sor, PA-C 06/24/24 1007    Griselda Norris, MD 06/25/24 0005

## 2024-06-24 NOTE — Progress Notes (Signed)
  Echocardiogram 2D Echocardiogram has been performed.  Juliene JINNY Rucks 06/24/2024, 5:29 PM

## 2024-06-24 NOTE — ED Triage Notes (Signed)
 Pt arrived from home via POV c/o epistaxis that began approx 2310 and headache. Pt noted to be very hypertensive in triage. Pt also on eliquis 

## 2024-06-24 NOTE — ED Notes (Signed)
 Hourly rounding complete. Pt alert or resting, no distress noted, offered toileting and diet as appropriate. Side rails up, call light within reach. Pt denies pain or further needs at this time.   ENT cart outside room waiting on ENT provider

## 2024-06-25 ENCOUNTER — Ambulatory Visit

## 2024-06-25 DIAGNOSIS — R040 Epistaxis: Secondary | ICD-10-CM | POA: Diagnosis not present

## 2024-06-25 LAB — CBC
HCT: 39.4 % (ref 36.0–46.0)
Hemoglobin: 12.9 g/dL (ref 12.0–15.0)
MCH: 30.3 pg (ref 26.0–34.0)
MCHC: 32.7 g/dL (ref 30.0–36.0)
MCV: 92.5 fL (ref 80.0–100.0)
Platelets: 315 K/uL (ref 150–400)
RBC: 4.26 MIL/uL (ref 3.87–5.11)
RDW: 14.3 % (ref 11.5–15.5)
WBC: 7.7 K/uL (ref 4.0–10.5)
nRBC: 0 % (ref 0.0–0.2)

## 2024-06-25 MED ORDER — AMLODIPINE BESYLATE 10 MG PO TABS
10.0000 mg | ORAL_TABLET | Freq: Every day | ORAL | Status: DC
  Administered 2024-06-25: 10 mg via ORAL
  Filled 2024-06-25: qty 1

## 2024-06-25 MED ORDER — CLONIDINE HCL 0.1 MG PO TABS
0.1000 mg | ORAL_TABLET | Freq: Two times a day (BID) | ORAL | Status: DC
  Administered 2024-06-25: 0.1 mg via ORAL
  Filled 2024-06-25: qty 1

## 2024-06-25 NOTE — Telephone Encounter (Signed)
 Nicole Daniels picked up signed letter yesterday.

## 2024-06-25 NOTE — Care Management Obs Status (Signed)
 MEDICARE OBSERVATION STATUS NOTIFICATION   Patient Details  Name: Nicole Daniels MRN: [id number] Date of Birth: [birth year]   Medicare Observation Status Notification Given:  Yes    Jon Cruel 06/25/2024, 11:22 AM

## 2024-06-25 NOTE — Plan of Care (Signed)
  Problem: Education: Goal: Knowledge of General Education information will improve Description: Including pain rating scale, medication(s)/side effects and non-pharmacologic comfort measures Outcome: Progressing   Problem: Safety: Goal: Ability to remain free from injury will improve Outcome: Progressing   Problem: Pain Managment: Goal: General experience of comfort will improve and/or be controlled Outcome: Progressing

## 2024-06-25 NOTE — Progress Notes (Signed)
 Mobility Specialist Progress Note:    06/25/24 1010  Mobility  Activity Ambulated with assistance (In hallway)  Level of Assistance Standby assist, set-up cues, supervision of patient - no hands on  Assistive Device None  Distance Ambulated (ft) 350 ft  Activity Response Tolerated well  Mobility Referral Yes  Mobility visit 1 Mobility  Mobility Specialist Start Time (ACUTE ONLY) [id number]  Mobility Specialist Stop Time (ACUTE ONLY) 0949  Mobility Specialist Time Calculation (min) (ACUTE ONLY) 10 min   Received pt in bed and agreeable to mobility. No physical assistance needed. No c/o. Returned to room without fault. Left pt in bed with personal belongings and call light within reach. All needs met.  Lavanda Pollack Mobility Specialist  Please contact via [employer] or  Rehab Office [phone]

## 2024-06-25 NOTE — Progress Notes (Signed)
 Reviewed AVS, patient expressed understanding of medications, MD follow up reviewed.   Removed IV, Site clean, dry and intact.  See LDA for information on wounds at discharge. CCMD contacted and informed patients is being discharged.  Patient states all belongings brought to the hospital at time of admission are accounted for and packed to take home.   Patient states her ride is 10 min away; requested patient contact the front desk when ride is out front so transportation can be called to take her to the front entrance.

## 2024-06-25 NOTE — Discharge Summary (Signed)
 DISCHARGE SUMMARY  Nicole Daniels Daniels  MR#: [id number]  DOB:06-16-1951  Date of Admission: 06/24/2024 Date of Discharge: 06/25/2024  Attending Physician:Jujuan Dugo ONEIDA Moores, MD  Patient's ERE:Nicole Daniels, Ginger, FNP  Disposition: D/C home   Follow-up Appts:  Follow-up Information     Corwin Ginger, FNP Follow up in 1 week(s).   Specialty: Family Medicine Contact information: [address] [place] [address] [phone]                 Tests Needing Follow-up: -Assess blood pressure in outpatient setting and determine if initiation of blood pressure medications is indicated  Discharge Diagnoses: Epistaxis Mildly elevated troponin Hypertension History of DVT/PE Hypertriglyceridemia Obesity - Body mass index is 30.82 kg/m.  Initial presentation: 73 year old with a history of HTN, HLD, DVT/PE Feb 2025 on Eliquis , IBS, hypothyroidism, anxiety/depression, and obesity who presented to the ER 8/26 with complaints of a persistent nosebleed for approximately 12 hours associated with lightheadedness and dizziness.  Her left nare was packed in the emergency room but bleeding persisted.  As a result ENT was consulted. Repeat packing proved to be successful.   Hospital Course:  Epistaxis Care as per ENT - bleeding has stopped at time of discharge - Eliquis  discontinued -instructions on care of nasal packing and follow-up provided to the patient directly by ENT -patient instructed to follow-up as per their recommendations   Mildly elevated troponin Likely simple demand ischemia in setting of significant hypertension -coronary calcium  score 0 in 2021 -no chest pain -TTE reveals preserved EF with no focal wall motion abnormalities -no further workup indicated   Hypertension Blood pressure 199/100 at presentation, in setting of anxiety related to nosebleed -blood pressure now improved -patient states she does not normally have high blood pressure and feels that her  symptoms are related to stress and anxiety of acute hospitalization -I have agreed to discharge her without blood pressure medications but have instructed her that close follow-up of her blood pressure will be needed in the outpatient setting and that if her elevated blood pressure persists and follow-up she will then need to begin blood pressure medications   History of DVT/PE Left lower extremity DVT and PE February 2025 -had been on Eliquis  since that time -was seen by Hematology in the outpatient setting-it was felt that her clotting was provoked in the setting of a viral illness -she was to complete 6 months of therapy with plan to discontinue Eliquis  at the end of August 2025 -Eliquis  discontinued during this admission/is not to be resumed upon discharge   Hypertriglyceridemia Continue Zetia    Obesity - Body mass index is 30.82 kg/m.  Allergies as of 06/25/2024       Reactions   Simvastatin Other (See Comments)   MYALGIAS   Statins Other (See Comments)   Has severe leg muscle aches and cramps   Bactrim [sulfamethoxazole -trimethoprim] Itching   Sertraline  Anxiety        Medication List     TAKE these medications    acetaminophen  325 MG tablet Commonly known as: TYLENOL  Take 650 mg by mouth every 6 (six) hours as needed.   chlorpheniramine  4 MG tablet Commonly known as: Allergy Relief Take 1 tablet (4 mg total) by mouth 2 (two) times daily as needed for allergies.   cyanocobalamin  500 MCG tablet Commonly known as: VITAMIN B12 Take 500 mcg by mouth daily.   Diphenhist 25 mg capsule Generic drug: diphenhydrAMINE  Take 25 mg by mouth every 6 (six) hours as needed (  Prior to infusion).   ezetimibe  10 MG tablet Commonly known as: ZETIA  Take 1 tablet (10 mg total) by mouth daily.   fluticasone  50 MCG/ACT nasal spray Commonly known as: FLONASE  Place 1 spray into both nostrils as needed for allergies.   levothyroxine  75 MCG tablet Commonly known as: SYNTHROID  Take 1  tablet (75 mcg total) by mouth daily.   predniSONE  10 MG tablet Commonly known as: DELTASONE  Take 10 mg by mouth daily with breakfast.   Vitamin D3 50 MCG (2000 UT) capsule Take 2,000 Units by mouth every other day.        Day of Discharge BP (!) 174/87   Pulse 71   Temp 98 F (36.7 C) (Oral)   Resp 17   Ht 5' 3 (1.6 m)   Wt 78.9 kg   SpO2 97%   BMI 30.82 kg/m   Physical Exam: General: No acute respiratory distress Lungs: Clear to auscultation bilaterally without wheezes or crackles Cardiovascular: Regular rate and rhythm without murmur gallop or rub normal S1 and S2 Abdomen: Nontender, nondistended, soft, bowel sounds positive, no rebound, no ascites, no appreciable mass Extremities: No significant cyanosis, clubbing, or edema bilateral lower extremities  Basic Metabolic Panel: Recent Labs  Lab 06/24/24 0126  NA 140  K 3.7  CL 103  CO2 25  GLUCOSE 105*  BUN 14  CREATININE 0.91  CALCIUM  9.2    CBC: Recent Labs  Lab 06/24/24 0126 06/24/24 1408 06/25/24 0225  WBC 8.1  --  7.7  HGB 14.6 12.9 12.9  HCT 44.9 40.7 39.4  MCV 93.5  --  92.5  PLT 377  --  315    Time spent in discharge (includes decision making & examination of pt): 35 minutes  06/25/2024, 2:44 PM   Reyes IVAR Moores, MD [facility] Office  [phone]

## 2024-06-25 NOTE — TOC Initial Note (Signed)
 Transition of Care [facility]) - Initial/Assessment Note    Patient Details  Name: Nicole Daniels MRN: [id number] Date of Birth: [birth year]  Transition of Care [facility]) CM/SW Contact:    Jeoffrey LITTIE Moose, [place] Phone Number: 06/25/2024, 9:00 AM  Clinical Narrative:                 Pt admitted from home due to epistaxis and headache. No current TOC needs, please consult as needs arise.        Patient Goals and CMS Choice            Expected Discharge Plan and Services                                              Prior Living Arrangements/Services                       Activities of Daily Living   ADL Screening (condition at time of admission) Independently performs ADLs?: Yes (appropriate for developmental age) Is the patient deaf or have difficulty hearing?: No Does the patient have difficulty seeing, even when wearing glasses/contacts?: No Does the patient have difficulty concentrating, remembering, or making decisions?: No  Permission Sought/Granted                  Emotional Assessment              Admission diagnosis:  Epistaxis [R04.0] Elevated troponin I level [R79.89] Left-sided epistaxis [R04.0] Essential hypertension [I10] Patient Active Problem List   Diagnosis Date Noted   Epistaxis 06/24/2024   Hypertensive urgency 06/24/2024   History of DVT (deep vein thrombosis) 06/24/2024   History of pulmonary embolus (PE) 06/24/2024   Hypertriglyceridemia 06/24/2024   Acute right flank pain 01/11/2024   Pruritus 01/11/2024   Acute deep vein thrombosis (DVT) of calf muscle vein of left lower extremity (HCC) 12/31/2023   Hypokalemia 12/31/2023   Melena 12/31/2023   Anticoagulated 12/31/2023   Non-recurrent acute serous otitis media of right ear 12/31/2023   Femoral popliteal artery thrombus (HCC) 12/24/2023   Bilateral leg pain 12/21/2023   History of colon polyps 05/15/2023   Benign paroxysmal positional vertigo due to  bilateral vestibular disorder 03/21/2023   Recurrent sinusitis 02/14/2023   Class 1 obesity due to excess calories without serious comorbidity with body mass index (BMI) of 30.0 to 30.9 in adult 11/29/2022   Post-herpetic trigeminal neuralgia 11/29/2022   B12 deficiency 02/28/2022   Cyst of right kidney 12/20/2021   Angiomyolipoma of right kidney 12/20/2021   Benign essential microscopic hematuria 12/20/2021   Hypothyroidism 11/28/2021   Osteopenia 10/06/2021   Presbycusis of both ears 09/10/2019   Postural kyphosis 09/05/2019   Tinnitus aurium, bilateral 08/29/2019   Acquired scoliosis 10/17/2018   Intradural arachnoid cyst of spine 01/31/2017   Cervical radiculopathy 12/26/2016   Vitamin D  deficiency 04/08/2010   Obesity (BMI 30.0-34.9) 08/11/2008   PATENT FORAMEN OVALE 01/28/2005   Mixed hyperlipidemia 03/30/2001   PCP:  Corwin Antu, FNP Pharmacy:   [facility] [phone] [address], [place] - [address] [address] Phone: [phone] Fax: [phone]     Social Drivers of Health (SDOH) Social History: SDOH Screenings   Food Insecurity: No Food Insecurity (06/24/2024)  Housing: Unknown (06/24/2024)  Transportation Needs: Unknown (06/24/2024)  Utilities: Not At Risk (06/24/2024)  Alcohol Screen:  Low Risk  (08/27/2023)  Depression (PHQ2-9): Low Risk  (01/23/2024)  Financial Resource Strain: Low Risk  (12/24/2023)  Physical Activity: Insufficiently Active (12/24/2023)  Social Connections: Moderately Integrated (06/25/2024)  Stress: No Stress Concern Present (12/24/2023)  Tobacco Use: Low Risk  (06/24/2024)  Health Literacy: Adequate Health Literacy (08/27/2023)   SDOH Interventions:     Readmission Risk Interventions     No data to display

## 2024-06-26 ENCOUNTER — Telehealth: Payer: Self-pay | Admitting: Gastroenterology

## 2024-06-26 NOTE — Telephone Encounter (Signed)
 Inbound call from pt requesting to speak to the nurse in order to find out if she needs to have her second dose of Hep B injection before she can have another procedure done. Patient is requesting a call back.Please advise.

## 2024-06-26 NOTE — Telephone Encounter (Signed)
 Spoke with patient & she would prefer to hold off on rescheduling second hep b vaccine d/t several appointments coming up and would like to get her infusion scheduled first which is currently under review after recent denial. Advised her our office would be in touch once we hear an updated from [facility].

## 2024-06-28 ENCOUNTER — Ambulatory Visit: Payer: Self-pay | Admitting: Family

## 2024-06-28 NOTE — Progress Notes (Signed)
 noted

## 2024-07-01 DIAGNOSIS — R040 Epistaxis: Secondary | ICD-10-CM | POA: Diagnosis not present

## 2024-07-01 DIAGNOSIS — I2699 Other pulmonary embolism without acute cor pulmonale: Secondary | ICD-10-CM | POA: Diagnosis not present

## 2024-07-01 NOTE — Telephone Encounter (Signed)
 Inbound call from pt is requesting to know if she will have her infusion done this week or not because she has not heard from our office. Patient stated that she is approved to have her infusion. Patient is requesting a call back from the nurse to advise her if she can get her infusion still. Best number to reach patient is her mobile number if not to call her home number and leave a VM. Please advise.

## 2024-07-02 NOTE — Telephone Encounter (Signed)
 Spoke with Delon with [facility]. Delon stated that the plan is to have the pt infused this Saturday.  Pt was made aware, Pt was notified that [facility] would be calling the pt . Pt verbalized understanding with all questions answered.

## 2024-07-08 ENCOUNTER — Encounter: Payer: Self-pay | Admitting: Family

## 2024-07-08 ENCOUNTER — Ambulatory Visit: Admitting: Family

## 2024-07-08 ENCOUNTER — Ambulatory Visit: Payer: Self-pay | Admitting: Family

## 2024-07-08 VITALS — BP 126/76 | HR 78 | Temp 98.6°F | Ht 65.0 in | Wt 180.6 lb

## 2024-07-08 DIAGNOSIS — R2681 Unsteadiness on feet: Secondary | ICD-10-CM

## 2024-07-08 DIAGNOSIS — D329 Benign neoplasm of meninges, unspecified: Secondary | ICD-10-CM

## 2024-07-08 DIAGNOSIS — K5190 Ulcerative colitis, unspecified, without complications: Secondary | ICD-10-CM | POA: Diagnosis not present

## 2024-07-08 DIAGNOSIS — R5383 Other fatigue: Secondary | ICD-10-CM

## 2024-07-08 DIAGNOSIS — R635 Abnormal weight gain: Secondary | ICD-10-CM

## 2024-07-08 DIAGNOSIS — R252 Cramp and spasm: Secondary | ICD-10-CM

## 2024-07-08 DIAGNOSIS — Z7952 Long term (current) use of systemic steroids: Secondary | ICD-10-CM | POA: Diagnosis not present

## 2024-07-08 DIAGNOSIS — Z86718 Personal history of other venous thrombosis and embolism: Secondary | ICD-10-CM

## 2024-07-08 DIAGNOSIS — R9082 White matter disease, unspecified: Secondary | ICD-10-CM | POA: Diagnosis not present

## 2024-07-08 DIAGNOSIS — I998 Other disorder of circulatory system: Secondary | ICD-10-CM

## 2024-07-08 LAB — IBC + FERRITIN
Ferritin: 19.9 ng/mL (ref 10.0–291.0)
Iron: 93 ug/dL (ref 42–145)
Saturation Ratios: 21.6 % (ref 20.0–50.0)
TIBC: 431.2 ug/dL (ref 250.0–450.0)
Transferrin: 308 mg/dL (ref 212.0–360.0)

## 2024-07-08 LAB — CBC
HCT: 40.8 % (ref 36.0–46.0)
Hemoglobin: 13.4 g/dL (ref 12.0–15.0)
MCHC: 32.9 g/dL (ref 30.0–36.0)
MCV: 92 fl (ref 78.0–100.0)
Platelets: 425 K/uL — ABNORMAL HIGH (ref 150.0–400.0)
RBC: 4.44 Mil/uL (ref 3.87–5.11)
RDW: 14.1 % (ref 11.5–15.5)
WBC: 8.3 K/uL (ref 4.0–10.5)

## 2024-07-08 LAB — BASIC METABOLIC PANEL WITH GFR
BUN: 17 mg/dL (ref 6–23)
CO2: 30 meq/L (ref 19–32)
Calcium: 9.7 mg/dL (ref 8.4–10.5)
Chloride: 104 meq/L (ref 96–112)
Creatinine, Ser: 0.95 mg/dL (ref 0.40–1.20)
GFR: 59.08 mL/min — ABNORMAL LOW (ref >60.00)
Glucose, Bld: 87 mg/dL (ref 70–99)
Potassium: 3.7 meq/L (ref 3.5–5.1)
Sodium: 141 meq/L (ref 135–145)

## 2024-07-08 LAB — CK: Total CK: 33 U/L (ref 17–177)

## 2024-07-08 LAB — TSH: TSH: 0.68 u[IU]/mL (ref 0.35–5.50)

## 2024-07-08 LAB — MAGNESIUM: Magnesium: 2.1 mg/dL (ref 1.5–2.5)

## 2024-07-08 NOTE — Progress Notes (Signed)
 Established Patient Office Visit  Subjective:      CC:  Chief Complaint  Patient presents with  . Hospitalization Follow-up    HPI: Nicole Daniels is a 73 y.o. female presenting on 07/08/2024 for Hospitalization Follow-up .  Discussed the use of AI scribe software for clinical note transcription with the patient, who gave verbal consent to proceed.  History of Present Illness Nicole Daniels is a 74 year old female with a history of DVT and PE who presents with persistent fatigue and muscle cramps.  She was admitted to the hospital on August 26th and discharged on August 27th for persistent epistaxis lasting approximately 12 hours, associated with lightheadedness and dizziness. The bleeding was initially resistant to packing but was eventually controlled with repeat packing. During this time, her blood pressure was significantly elevated at 199/100, but it has since normalized to 126/76. She was discharged without blood pressure medications. Her Eliquis  was discontinued due to the bleeding.  She has a history of deep vein thrombosis in the left lower extremity and pulmonary embolism in February 2025, for which she had been on Eliquis . She recalls that hematology considered her clotting may have been provoked by a viral illness and recommended she complete six months of Eliquis , discontinuing it at the end of August 2025. She has not resumed Eliquis  since then.  She experiences persistent fatigue since her hospital visit, which she attributes to her current medication regimen. She is currently on prednisone , having started at 40 mg daily and tapering to 10 mg daily over eight weeks. She experienced a slight reaction to a Tremfya infusion, which led to the continuation of prednisone  to manage inflammation. She is scheduled for her second infusion of Entyvio soon.  She experiences muscle cramps, particularly in her hands and feet, which have been occurring since her  hospital visit. She has been taking potassium supplements, suspecting a link between her cramps and potassium levels, especially considering her prednisone  use. She also experiences swelling in her left foot and soreness on the top of her foot, which she associates with her cramps.  She has been diagnosed with left-sided ulcerative colitis, confirmed by a colonoscopy. She did not respond well to mesalamine  enemas and had a paradoxical response to oral mesalamine . She initially responded to higher doses of budesonide  but lost response upon tapering.  She experiences hair loss, particularly when washing and combing her hair, and is concerned about her thyroid  function. She also experiences off-balance episodes, which she describes as losing her balance without falling. These episodes occur without positional changes and have been more noticeable since starting prednisone . She also experiences sweats, which she attributes to prednisone  use.  She has a history of chronic microvascular ischemic changes and a small meningioma, which she has been aware. She experiences occasional dizziness and instability. She does not currently see a neurologist but has a history of seeing a neurosurgeon for spine surgeries.         Social history:  Relevant past medical, surgical, family and social history reviewed and updated as indicated. Interim medical history since our last visit reviewed.  Allergies and medications reviewed and updated.  DATA REVIEWED: CHART IN EPIC  ROS: Negative unless specifically indicated above in HPI.    Current Outpatient Medications:  .  acetaminophen  (TYLENOL ) 325 MG tablet, Take 650 mg by mouth every 6 (six) hours as needed., Disp: , Rfl:  .  chlorpheniramine  (ALLERGY RELIEF) 4 MG tablet, Take 1 tablet (4 mg total)  by mouth 2 (two) times daily as needed for allergies., Disp: , Rfl:  .  Cholecalciferol  (VITAMIN D3) 50 MCG (2000 UT) capsule, Take 2,000 Units by mouth every other  day., Disp: , Rfl:  .  cyanocobalamin  (VITAMIN B12) 500 MCG tablet, Take 500 mcg by mouth daily., Disp: , Rfl:  .  diphenhydrAMINE  (DIPHENHIST) 25 mg capsule, Take 25 mg by mouth every 6 (six) hours as needed (Prior to infusion)., Disp: , Rfl:  .  ezetimibe  (ZETIA ) 10 MG tablet, Take 1 tablet (10 mg total) by mouth daily., Disp: 90 tablet, Rfl: 3 .  fluticasone  (FLONASE ) 50 MCG/ACT nasal spray, Place 1 spray into both nostrils as needed for allergies. , Disp: , Rfl:  .  levothyroxine  (SYNTHROID ) 75 MCG tablet, Take 1 tablet (75 mcg total) by mouth daily., Disp: 90 tablet, Rfl: 3 .  predniSONE  (DELTASONE ) 10 MG tablet, Take 10 mg by mouth daily with breakfast., Disp: , Rfl:         Objective:        BP 126/76 (BP Location: Left Arm, Patient Position: Sitting, Cuff Size: Normal)   Pulse 78   Temp 98.6 F (37 C) (Temporal)   Ht 5' 5 (1.651 m)   Wt 180 lb 9.6 oz (81.9 kg)   SpO2 96%   BMI 30.05 kg/m   Physical Exam VITALS: BP- 126/76 MEASUREMENTS: Weight- 180. HEENT: Ears normal  Wt Readings from Last 3 Encounters:  07/08/24 180 lb 9.6 oz (81.9 kg)  06/24/24 174 lb (78.9 kg)  06/16/24 178 lb 6 oz (80.9 kg)    Physical Exam Vitals reviewed.  Constitutional:      General: She is not in acute distress.    Appearance: Normal appearance. She is normal weight. She is not ill-appearing, toxic-appearing or diaphoretic.  HENT:     Head: Normocephalic.  Cardiovascular:     Rate and Rhythm: Normal rate.  Pulmonary:     Effort: Pulmonary effort is normal.  Musculoskeletal:        General: Normal range of motion.  Neurological:     General: No focal deficit present.     Mental Status: She is alert and oriented to person, place, and time. Mental status is at baseline.     Cranial Nerves: Cranial nerves 2-12 are intact. No cranial nerve deficit or facial asymmetry.     Sensory: Sensation is intact.     Motor: Motor function is intact.     Coordination: Romberg sign negative.  Heel to [facility] Test abnormal.     Gait: Gait is intact.  Psychiatric:        Mood and Affect: Mood normal.        Behavior: Behavior normal.        Thought Content: Thought content normal.        Judgment: Judgment normal.          Results LABS Troponin: Mildly elevated (06/25/2024) Hypocoagulable workup: Negative (01/23/2024) Factor VIII: Elevated (01/23/2024)  RADIOLOGY CT scan: Chronic micro ischemic changes, small calcified lesions suspected of meningioma (06/16/2024)  DIAGNOSTIC TTE: Preserved ejection fraction, no focal wall motion abnormalities (06/25/2024) Colonoscopy: Left-sided ulcerative colitis (05/12/2024)  Assessment & Plan:   Assessment and Plan Assessment & Plan Left sided ulcerative colitis on steroid taper and transitioning to Entyvio Recently diagnosed with left sided ulcerative colitis. Did not respond well to mesothelamine enemas and had a paradoxical response to oral mesothelamine. Responded to higher doses of budesonide  but lost response upon tapering. Currently on  a slow prednisone  taper. Tremfya was not effective, and transitioning to Entyvio is planned. Insurance required failure of Tremfya before approving Entyvio. - Continue prednisone  taper - Administer Entyvio infusion as scheduled -continue f/u with GI as scheduled  Muscle cramps and spasms with evaluation for electrolyte abnormalities Experiencing muscle cramps and spasms, primarily in the hands and feet, since hospitalization. Suspected electrolyte imbalance, possibly related to prednisone  use and low potassium levels. She has restarted potassium pills. - Check magnesium  and potassium levels - Consider magnesium  supplementation with magnesium  glycinate 300 mg nightly - Encourage increased water intake, possibly with flavored water  Unsteadiness on feet and abnormal gait with referral to neurology Reports unsteadiness and balance issues, not associated with positional changes. Symptoms may be  related to chronic white matter disease or other neurological issues. - Refer to neurology for further evaluation  Abnormal weight gain and Cushingoid features secondary to systemic steroid use Weight gain and Cushingoid features likely due to prolonged prednisone  use. She has gained approximately 11 pounds since March. - Monitor weight and symptoms of Cushing's syndrome - Consider checking cortisol levels if symptoms persist  Benign meningioma and chronic white matter disease of brain History of benign meningioma and chronic white matter disease. Symptoms of unsteadiness may be related to these conditions. - Monitor for any changes in symptoms or new neurological symptoms  History of deep vein thrombosis and pulmonary embolism, completed anticoagulation Completed six months of anticoagulation with Eliquis  for DVT and PE, likely provoked by a viral illness. No further anticoagulation required as per hematology recommendation. - Monitor for signs of new blood clots - No need for aspirin  or further anticoagulation as per hematology  Recording duration: 20 minutes  Wt Readings from Last 3 Encounters:  07/08/24 180 lb 9.6 oz (81.9 kg)  06/24/24 174 lb (78.9 kg)  06/16/24 178 lb 6 oz (80.9 kg)        Return in about 6 months (around 01/05/2025) for f/u CPE.     Ginger Patrick, MSN, APRN, FNP-C [facility]

## 2024-07-10 DIAGNOSIS — Z86718 Personal history of other venous thrombosis and embolism: Secondary | ICD-10-CM | POA: Insufficient documentation

## 2024-07-11 ENCOUNTER — Telehealth: Payer: Self-pay | Admitting: Family

## 2024-07-11 DIAGNOSIS — K5190 Ulcerative colitis, unspecified, without complications: Secondary | ICD-10-CM | POA: Diagnosis not present

## 2024-07-15 NOTE — Telephone Encounter (Signed)
 I did not see a note/message attached to this thread?

## 2024-07-23 DIAGNOSIS — D329 Benign neoplasm of meninges, unspecified: Secondary | ICD-10-CM | POA: Diagnosis not present

## 2024-07-23 DIAGNOSIS — Z6829 Body mass index (BMI) 29.0-29.9, adult: Secondary | ICD-10-CM | POA: Diagnosis not present

## 2024-07-25 DIAGNOSIS — K5190 Ulcerative colitis, unspecified, without complications: Secondary | ICD-10-CM | POA: Diagnosis not present

## 2024-07-29 ENCOUNTER — Other Ambulatory Visit: Payer: Self-pay

## 2024-07-29 ENCOUNTER — Encounter: Payer: Self-pay | Admitting: Family

## 2024-07-29 ENCOUNTER — Telehealth: Payer: Self-pay

## 2024-07-29 NOTE — Telephone Encounter (Signed)
-----   Message from Cathryne PARAS May sent at 07/28/2024  5:33 PM EDT ----- Pod B- Can we see if Alan or I have  cancellation or open spot in the next week or two for this patient. I will see if I have any spots open tomorrow but if you guys can keep eye out.   Thanks- Deanna

## 2024-07-29 NOTE — Telephone Encounter (Signed)
 Pt made aware of Deanna May NP recommendations: Pt was scheduled to see Alan Coombs PA on 08/04/2024 at 8:40 AM. Pt made aware. Pt verbalized understanding with all questions answered.

## 2024-07-30 ENCOUNTER — Ambulatory Visit
Admission: RE | Admit: 2024-07-30 | Discharge: 2024-07-30 | Disposition: A | Discharge status: Home / Self Care | Source: Ambulatory Visit | Attending: Family Medicine | Admitting: Family Medicine

## 2024-07-30 ENCOUNTER — Encounter: Payer: Self-pay | Admitting: Family Medicine

## 2024-07-30 ENCOUNTER — Ambulatory Visit: Payer: Self-pay | Admitting: Family Medicine

## 2024-07-30 ENCOUNTER — Ambulatory Visit: Admitting: Family Medicine

## 2024-07-30 VITALS — BP 132/78 | HR 77 | Temp 98.7°F | Ht 65.0 in | Wt 184.2 lb

## 2024-07-30 DIAGNOSIS — M7989 Other specified soft tissue disorders: Secondary | ICD-10-CM

## 2024-07-30 DIAGNOSIS — Z86718 Personal history of other venous thrombosis and embolism: Secondary | ICD-10-CM

## 2024-07-30 DIAGNOSIS — M79605 Pain in left leg: Secondary | ICD-10-CM | POA: Diagnosis not present

## 2024-07-30 DIAGNOSIS — M79662 Pain in left lower leg: Secondary | ICD-10-CM

## 2024-07-30 NOTE — Assessment & Plan Note (Signed)
 Acute, no known injury. Given history of provoked DVT now off anticoagulation for the last month, now with new left calf pain and left leg swelling, concern for possible DVT. Will send for stat ultrasound Dopplers left lower leg to rule out DVT. Return and ER precautions provided.  If patient has chest pain and shortness of breath she will go immediately to the emergency room.

## 2024-07-30 NOTE — Progress Notes (Signed)
 Patient ID: Nicole Daniels, female    DOB: 23-Mar-1951, 73 y.o.   MRN: [id number]  This visit was conducted in person.  BP 132/78   Pulse 77   Temp 98.7 F (37.1 C) (Oral)   Ht 5' 5 (1.651 m)   Wt 184 lb 3.2 oz (83.6 kg)   SpO2 96%   BMI 30.65 kg/m    CC:  Chief Complaint  Patient presents with   Foot Swelling    Left foot on and off for about a month    Subjective:   HPI: Nicole Daniels is a 73 y.o. female patient of Nicole Daniels with history of obesity, hypothyroidism, and history of past DVT/PE presenting on 07/30/2024 for Foot Swelling (Left foot on and off for about a month)  She reports new onset swelling in left foot off-and-on for the last month.  In the last week she has noted increase in  swelling. If on left  leg for any length of time.. she has pain in foot and calf. Right leg and foot not teder or swollen. She has been trying to walk but it hurts to do this.    She has started Entivio  infusion for colitis. GI says it is not related.  Had blood clot in 12/2023 in left leg. Occurred after inactivity with COVID/flu.. completed 6 months of anticoagulant about 1 month ago.         Relevant past medical, surgical, family and social history reviewed and updated as indicated. Interim medical history since our last visit reviewed. Allergies and medications reviewed and updated. Outpatient Medications Prior to Visit  Medication Sig Dispense Refill   acetaminophen  (TYLENOL ) 325 MG tablet Take 650 mg by mouth every 6 (six) hours as needed.     chlorpheniramine  (ALLERGY RELIEF) 4 MG tablet Take 1 tablet (4 mg total) by mouth 2 (two) times daily as needed for allergies.     Cholecalciferol  (VITAMIN D3) 50 MCG (2000 UT) capsule Take 2,000 Units by mouth every other day.     cyanocobalamin  (VITAMIN B12) 500 MCG tablet Take 500 mcg by mouth daily.     diphenhydrAMINE  (DIPHENHIST) 25 mg capsule Take 25 mg by mouth every 6 (six) hours as needed (Prior to  infusion).     ENTYVIO 300 MG injection Inject 300 mg into the vein every 6 (six) weeks.     ezetimibe  (ZETIA ) 10 MG tablet Take 1 tablet (10 mg total) by mouth daily. 90 tablet 3   fluticasone  (FLONASE ) 50 MCG/ACT nasal spray Place 1 spray into both nostrils as needed for allergies.      levothyroxine  (SYNTHROID ) 75 MCG tablet Take 1 tablet (75 mcg total) by mouth daily. 90 tablet 3   predniSONE  (DELTASONE ) 10 MG tablet Take 10 mg by mouth daily with breakfast.     No facility-administered medications prior to visit.     Per HPI unless specifically indicated in ROS section below Review of Systems Objective:  BP 132/78   Pulse 77   Temp 98.7 F (37.1 C) (Oral)   Ht 5' 5 (1.651 m)   Wt 184 lb 3.2 oz (83.6 kg)   SpO2 96%   BMI 30.65 kg/m   Wt Readings from Last 3 Encounters:  07/30/24 184 lb 3.2 oz (83.6 kg)  07/08/24 180 lb 9.6 oz (81.9 kg)  06/24/24 174 lb (78.9 kg)      Physical Exam Constitutional:      General: She is not in acute distress.  Appearance: Normal appearance. She is well-developed. She is not ill-appearing or toxic-appearing.  HENT:     Head: Normocephalic.     Right Ear: Hearing, tympanic membrane, ear canal and external ear normal. Tympanic membrane is not erythematous, retracted or bulging.     Left Ear: Hearing, tympanic membrane, ear canal and external ear normal. Tympanic membrane is not erythematous, retracted or bulging.     Nose: No mucosal edema or rhinorrhea.     Right Sinus: No maxillary sinus tenderness or frontal sinus tenderness.     Left Sinus: No maxillary sinus tenderness or frontal sinus tenderness.     Mouth/Throat:     Pharynx: Uvula midline.  Eyes:     General: Lids are normal. Lids are everted, no foreign bodies appreciated.     Conjunctiva/sclera: Conjunctivae normal.     Pupils: Pupils are equal, round, and reactive to light.  Neck:     Thyroid : No thyroid  mass or thyromegaly.     Vascular: No carotid bruit.     Trachea:  Trachea normal.  Cardiovascular:     Rate and Rhythm: Normal rate and regular rhythm.     Pulses: Normal pulses.     Heart sounds: Normal heart sounds, S1 normal and S2 normal. No murmur heard.    No friction rub. No gallop.     Comments:  Varicose veins bilateral lower legs Pulmonary:     Effort: Pulmonary effort is normal. No tachypnea or respiratory distress.     Breath sounds: Normal breath sounds. No decreased breath sounds, wheezing, rhonchi or rales.  Abdominal:     General: Bowel sounds are normal.     Palpations: Abdomen is soft.     Tenderness: There is no abdominal tenderness.  Musculoskeletal:     Cervical back: Normal range of motion and neck supple.     Left lower leg: Edema present.     Comments:  Diffuse calf soreness, ttp in left lateral ankle and dorsal foot.  Foot and ankle swollen  Skin:    General: Skin is warm and dry.     Findings: No rash.  Neurological:     Mental Status: She is alert.  Psychiatric:        Mood and Affect: Mood is not anxious or depressed.        Speech: Speech normal.        Behavior: Behavior normal. Behavior is cooperative.        Thought Content: Thought content normal.        Judgment: Judgment normal.       Results for orders placed or performed in visit on 07/08/24  Basic metabolic panel with GFR   Collection Time: 07/08/24 10:32 AM  Result Value Ref Range   Sodium 141 135 - 145 mEq/L   Potassium 3.7 3.5 - 5.1 mEq/L   Chloride 104 96 - 112 mEq/L   CO2 30 19 - 32 mEq/L   Glucose, Bld 87 70 - 99 mg/dL   BUN 17 6 - 23 mg/dL   Creatinine, Ser 9.04 0.40 - 1.20 mg/dL   GFR 40.91 (L) >39.99 mL/min   Calcium  9.7 8.4 - 10.5 mg/dL  Magnesium    Collection Time: 07/08/24 10:32 AM  Result Value Ref Range   Magnesium  2.1 1.5 - 2.5 mg/dL  CK   Collection Time: 07/08/24 10:32 AM  Result Value Ref Range   Total CK 33 17 - 177 U/L  CBC   Collection Time: 07/08/24 10:53 AM  Result  Value Ref Range   WBC 8.3 4.0 - 10.5 K/uL   RBC  4.44 3.87 - 5.11 Mil/uL   Platelets 425.0 (H) 150.0 - 400.0 K/uL   Hemoglobin 13.4 12.0 - 15.0 g/dL   HCT 59.1 63.9 - 53.9 %   MCV 92.0 78.0 - 100.0 fl   MCHC 32.9 30.0 - 36.0 g/dL   RDW 85.8 88.4 - 84.4 %  TSH   Collection Time: 07/08/24 10:53 AM  Result Value Ref Range   TSH 0.68 0.35 - 5.50 uIU/mL  IBC + Ferritin   Collection Time: 07/08/24 10:53 AM  Result Value Ref Range   Iron 93 42 - 145 ug/dL   Transferrin 691.9 787.9 - 360.0 mg/dL   Saturation Ratios 78.3 20.0 - 50.0 %   Ferritin 19.9 10.0 - 291.0 ng/mL   TIBC 431.2 250.0 - 450.0 mcg/dL    Assessment and Plan  Pain and swelling of left lower leg Assessment & Plan: Acute, no known injury. Given history of provoked DVT now off anticoagulation for the last month, now with new left calf pain and left leg swelling, concern for possible DVT. Will send for stat ultrasound Dopplers left lower leg to rule out DVT. Return and ER precautions provided.  If patient has chest pain and shortness of breath she will go immediately to the emergency room.  Orders: -     US  Venous Img Lower Unilateral Left (DVT); Future  History of DVT of lower extremity -     US  Venous Img Lower Unilateral Left (DVT); Future    No follow-ups on file.   Greig Ring, MD

## 2024-07-30 NOTE — Telephone Encounter (Signed)
 I spoke with pt swelling in left foot for 1 month with pain on and off in lt foot for 2 wks. Now pain level is 5. Pain is usually worse on weight bearing.  No redness or warmth.swelling of foot goes down overnight. Pt said on and off 1 month SOB when goes up and down stairs and chest tightness on and off. No CP, tightness in chest or SOB now. Pt does not want to go to ED. Pt scheduled appt with Dr Avelina 07/30/24 at 9:20 with UC & ED precautions and pt voiced understanding. Sending note to [facility] and [place] pool and will teams AV CM A.

## 2024-08-01 NOTE — Progress Notes (Addendum)
 08/04/2024 Nicole Daniels [id number] [birth year]  Referring provider: Corwin Antu, FNP Primary GI doctor: Dr. Federico ( Dr. Suzann)  ASSESSMENT AND PLAN:   Ulcerative colitis diagnosed April 2025 after flexible sigmoidoscopy Predominant symptoms diarrhea and rectal bleeding which have resolved Failed budesonide , Rowasa  enemas, Lialda  4.8 g Allergic reaction to Tremfya (lip swelling, throat swelling) Entyvio started 07/25/2024, has had 2 and next injection is Nov 7th, she was off prednisone  since after first injection, (2 weeks), she is describing headache right sided with blurred vision, last saw eye doctor April/Nicole Daniels Itching without rash this week, no rash, no jaundice Low grade temp 99, a week after first second shot Having sore throat/sinus Patient had negative stool study for infection 03/11/2024 06/24/2024 CTA negative, 07/30/2024 lower extremity ultrasound negative Diarrhea and rectal bleeding have resolved, formed stools Possible these are symptoms from Entyvio, versus withdrawal from prednisone  versus infection/sinus, no significant reactions - check CBC, CMET, cortisol, check urine, follow up with PCP for possible sinusitis, possible pityriasis rosea from rash on her back, follow up eye doctor - check antibody and titer before next Injection Nov 6th - Consider Humira or Remicade if symptoms persist  Will discuss further with Dr. Suzann  History of hemorrhoids Status post banding  History of DVT/PE Started on Eliquis  12/20/2023 completed 6 months Negative hematological workup with hematology Not a candidate for Jak inhibitors 06/24/2024 CTA negative, 07/30/2024 lower extremity ultrasound negative  Personal history of tubular adenomatous polyps 05/08/2023 5 polyps, nonbleeding external/internal hemorrhoids  History of diverticulosis 02/11/2024 CT acute uncomplicated diverticulitis treated with Augmentin  No AB pain Will call if any symptoms. Add on fiber  supplement, avoid NSAIDS, information given  B12 deficiency 07/08/2024 B12 312 Continue B12  IDA 07/08/2024  HGB 13.4 MCV 92.0 Platelets 425.0 07/08/2024 Iron 93 Ferritin 19.9  Recent Labs    02/07/24 1902 03/06/24 1717 05/08/24 1551 06/07/24 0930 06/13/24 1823 06/16/24 1406 06/24/24 0126 06/24/24 1408 06/25/24 0225 07/08/24 1053  HGB 13.0 13.7 13.6 13.1 14.0 14.9 14.6 12.9 12.9 13.4  Start on iron supplement every other day, recheck next OV  Pityriasis rosea Rash consistent with pityriasis rosea, a viral infection. Typically self-resolving. - Provided information about pityriasis rosea.  Recurrent acute sinusitis Sinus tenderness and recurrent infections, possibly exacerbated by seasonal allergies. - Advise primary care follow-up for sinus evaluation.  I have reviewed the clinic note as outlined by Nicole Coombs, PA and agree with the assessment, plan and medical decision making.  Nicole Daniels presents to the office status post second dose of Entyvio endorsing symptoms of right sided headache, visual changes, low-grade fever and feeling flushed.  In between her 1st and 2nd dose of Entyvio had tapered off of prednisone .  PCP note 07/08/2024 documents patient having a history of benign meningioma and chronic white matter disease of brain.  At that visit endorsed symptoms of unsteadiness on feet and abnormal gait.  PCP placed referral to neurology.  Noteworthy that the symptoms were present before patient started Entyvio.  While headache, nasopharyngitis and nausea can be side effects of Entyvio, the patient's antecedent neurological symptoms raise the possibility that they Nicole Daniels not be linked to the drug.  Also possible that some symptoms could be related to steroid withdrawal with adrenal insufficiency or pseudotumor cerebri.  At this juncture, I would recommend expediting neurology evaluation.  Next dose of Entyvio is not due until November 7 and I would recommend monitoring the patient  conservatively from a GI perspective while other systemic issues are  being evaluated.  Nicole Hausen, MD   Patient Care Team: Nicole Antu, FNP as PCP - General (Family Medicine) Nicole Shove, MD as Consulting Physician (Neurosurgery) Nicole Knee, MD (Inactive) as Consulting Physician (Urology) Nicole Slain, MD as Consulting Physician (Cardiology) Nicole Evalene PARAS, MD as Consulting Physician (Hematology and Oncology)  HISTORY OF PRESENT ILLNESS: 73 y.o. female presents for evaluation of ulcerative colitis. Last seen in the office on 06/16/2024 by Nicole Daniels May, NP.   IBD history: Diagnosed April 2025 after flexible sigmoidoscopy with symptoms of diarrhea and rectal bleeding. Budesonide  x 2 months with initial help for 6 weeks reoccurring last 2 weeks of medication Rowasa  enemas April 2025 without help Nicole Daniels 2025 oral mesalamine  Lialda  4.8 g 03/06/2024 CT diffuse colonic wall thickening compatible with colitis Nicole Daniels 2025 fecal calprotectin 3330 Started on Tremfya with anaphylaxis Repeat prednisone  taper 05/09/2024 fecal calprotectin 4080 Started on Entyvio x 07/25/2024, has had 2 doses, due Nov 7th  Last colonoscopy: 05/08/2023 5 polyps, nonbleeding external/internal hemorrhoids 01/29/2024 flexible sigmoidoscopy - Diffuse inflammation was found in the rectum, in the sigmoid colon and in the descending colon. Biopsied.  - Diverticulosis in the sigmoid colon.  - Non- bleeding internal hemorrhoids. Path: showed that she has left-sided inflammation in her colon as a result of ulcerative colitis.  Last small bowel imaging: 06/13/2024 CTAP W no evidence of active UC, mild diverticulosis without evidence of acute diverticulitis unremarkable liver, gallbladder pancreas and spleen Extraintestinal manifestations: none, some abnormal blurry vision Surgical history: no surgery.  Other significant medical history: History of DVT not a candidate for Jak inhibitor Vitamin D  deficiency B12  deficiency History of hemorrhoids IDA  Current History Discussed the use of AI scribe software for clinical note transcription with the patient, who gave verbal consent to proceed.  History of Present Illness   Nicole Daniels is a 73 year old female with ulcerative colitis who presents with symptoms following Entyvio treatment.  Diagnosed with ulcerative colitis in April after a flexible sigmoidoscopy, she has tried multiple treatments including budesonide , enemas, Lialda , and Tremfya, with limited success or adverse reactions. She began Entyvio on September 26th and has received two doses, with the next scheduled for November 7th.  Since starting Entyvio, she has experienced right-sided and occipital headaches, sometimes with blurred vision, but without photophobia or phonophobia. Tylenol  provides some relief. She also reports widespread itching without a rash and left leg swelling on the same side as a previous DVT. A recent ultrasound was negative for new clots.  She has had low-grade fevers up to 99.50F for three days, approximately a week after her second Entyvio dose, and feels flushed at times. Her sleep has been inconsistent, initially improving but later disrupted. No new urinary symptoms, but occasional itching without dysuria. She is increasing her water intake.  Her mood has been notably affected, describing it as 'terrible' and lacking affection, attributing changes to stress and challenges since her diagnosis in February.  She has a history of sinus issues, typically exacerbated by weather changes, treated with Augmentin  in the past. She notes right-sided sinus tenderness and occasional cough when her throat is dry. No ear pain or significant vertigo, but occasional dizziness, especially when bending over.  Gastrointestinal symptoms, including rectal bleeding and loose stools, have improved since stopping prednisone , with formed stools and no recent bleeding. Occasional  diarrhea is related to dietary intake.  She has experienced nausea since starting Entyvio, a new symptom for her. She denies significant abdominal pain, though recalls brief episodes  of discomfort.        Recent labs:  06/16/2024 CRP <1.0  03/07/2024 SED RATE 23 Fecal cal 05/09/2024 4080 07/08/2024 WBC 8.3 HGB 13.4 MCV 92.0 Platelets 425.0 07/08/2024 Iron 93 Ferritin 19.9  B12 312 FOLATE 21.4 06/16/2024 AST 13; 13 ALT 18; 18 Alkphos 73; 73 TBili 0.6; 0.6 05/08/2024 VITAMIN D  37.47  TB GOLD 05/12/2024 NEGATIVE or TB skin if indeterminate.  HepBsAG 05/12/2024 NON-REACTIVE  TPMT Activity: No results   IBD Health Care Maintenance: Annual Flu Vaccine - suggest getting Pneumococcal Vaccine if receiving immunosuppression: -  UTD TB testing if on anti-TNF, yearly - UTD Vitamin D  screening -  COVID vaccine suggest getting Shingrix suggest getting  Immunization History  Administered Date(s) Administered   Hepb-cpg 05/22/2024   Influenza,inj,Quad PF,6+ Mos 09/01/2015   PFIZER(Purple Top)SARS-COV-2 Vaccination 06/25/2020, 07/16/2020   Pneumococcal Polysaccharide-23 05/29/2022   Td 10/30/1988, 08/25/2008    RELEVANT GI HISTORY, LABS, IMAGING:  CBC    Component Value Date/Time   WBC 8.3 07/08/2024 1053   RBC 4.44 07/08/2024 1053   HGB 13.4 07/08/2024 1053   HCT 40.8 07/08/2024 1053   PLT 425.0 (H) 07/08/2024 1053   MCV 92.0 07/08/2024 1053   MCH 30.3 06/25/2024 0225   MCHC 32.9 07/08/2024 1053   RDW 14.1 07/08/2024 1053   LYMPHSABS 3.3 06/07/2024 0930   MONOABS 0.6 06/07/2024 0930   EOSABS 0.1 06/07/2024 0930   BASOSABS 0.0 06/07/2024 0930   Recent Labs    02/07/24 1902 03/06/24 1717 05/08/24 1551 06/07/24 0930 06/13/24 1823 06/16/24 1406 06/24/24 0126 06/24/24 1408 06/25/24 0225 07/08/24 1053  HGB 13.0 13.7 13.6 13.1 14.0 14.9 14.6 12.9 12.9 13.4    CMP     Component Value Date/Time   NA 141 07/08/2024 1032   NA 142 07/01/2020 0949   K 3.7 07/08/2024 1032   CL  104 07/08/2024 1032   CO2 30 07/08/2024 1032   GLUCOSE 87 07/08/2024 1032   BUN 17 07/08/2024 1032   BUN 11 07/01/2020 0949   CREATININE 0.95 07/08/2024 1032   CALCIUM  9.7 07/08/2024 1032   PROT 6.5 06/16/2024 1406   PROT 6.5 06/16/2024 1406   ALBUMIN 3.9 06/16/2024 1406   ALBUMIN 3.9 06/16/2024 1406   AST 13 06/16/2024 1406   AST 13 06/16/2024 1406   ALT 18 06/16/2024 1406   ALT 18 06/16/2024 1406   ALKPHOS 73 06/16/2024 1406   ALKPHOS 73 06/16/2024 1406   BILITOT 0.6 06/16/2024 1406   BILITOT 0.6 06/16/2024 1406   GFRNONAA >60 06/24/2024 0126   GFRAA 89 07/01/2020 0949      Latest Ref Rng & Units 06/16/2024    2:06 PM 06/07/2024    9:30 AM 05/08/2024    3:51 PM  Hepatic Function  Total Protein 6.0 - 8.3 g/dL 6.0 - 8.3 g/dL 6.5    6.5  5.9  6.6   Albumin 3.5 - 5.2 g/dL 3.5 - 5.2 g/dL 3.9    3.9  3.7  3.7   AST 0 - 37 U/L 0 - 37 U/L 13    13  16  13    ALT 0 - 35 U/L 0 - 35 U/L 18    18  15  13    Alk Phosphatase 39 - 117 U/L 39 - 117 U/L 73    73  71  77   Total Bilirubin 0.2 - 1.2 mg/dL 0.2 - 1.2 mg/dL 0.6    0.6  0.6  0.5   Bilirubin, Direct  0.0 - 0.3 mg/dL 0.1         Current Medications:   Current Outpatient Medications (Endocrine & Metabolic):    levothyroxine  (SYNTHROID ) 75 MCG tablet, Take 1 tablet (75 mcg total) by mouth daily.  Current Outpatient Medications (Cardiovascular):    ezetimibe  (ZETIA ) 10 MG tablet, Take 1 tablet (10 mg total) by mouth daily.  Current Outpatient Medications (Respiratory):    chlorpheniramine  (ALLERGY RELIEF) 4 MG tablet, Take 1 tablet (4 mg total) by mouth 2 (two) times daily as needed for allergies.   diphenhydrAMINE  (DIPHENHIST) 25 mg capsule, Take 25 mg by mouth every 6 (six) hours as needed (Prior to infusion).   fluticasone  (FLONASE ) 50 MCG/ACT nasal spray, Place 1 spray into both nostrils as needed for allergies.   Current Outpatient Medications (Analgesics):    acetaminophen  (TYLENOL ) 325 MG tablet, Take 650 mg by  mouth every 6 (six) hours as needed.  Current Outpatient Medications (Hematological):    cyanocobalamin  (VITAMIN B12) 500 MCG tablet, Take 500 mcg by mouth daily.  Current Outpatient Medications (Other):    Cholecalciferol  (VITAMIN D3) 50 MCG (2000 UT) capsule, Take 2,000 Units by mouth every other day.   ENTYVIO 300 MG injection, Inject 300 mg into the vein every 6 (six) weeks.  Medical History:  Past Medical History:  Diagnosis Date   Abdominal pain, right lower quadrant    Acquired cyst of kidney 01/08/2012   pt denies history of    Acute sinusitis, unspecified    Adenomatous colon polyp    Allergy    Angiomyolipoma    kidney   Anxiety    OCCASIONAL   Cataract    Chest pain    Chest pain, unspecified    Depressive disorder, not elsewhere classified 01/08/2012   not currently    Diverticulosis    Dizziness and giddiness    DVT (deep venous thrombosis) (HCC)    Dysfunction of eustachian tube    H/O Spinal surgery 02/20/2017   Hemorrhoids    History of diverticulitis of colon    History of fusion of cervical spine 04/25/2018   IBS (irritable bowel syndrome)    Liver lesion 2011   Neoplasm of skin of face    malignant   OA (osteoarthritis)    Daniels, NECK   Obesity, unspecified    Ostium secundum type atrial septal defect    Other and unspecified hyperlipidemia    Other malaise and fatigue    Palpitations    Pulmonary embolism (HCC)    Shortness of breath    Thyroid  disease    Ulcerative colitis (HCC)    Unspecified essential hypertension    pt states she does not have HTN   Unspecified sinusitis (chronic)    Unspecified vitamin D  deficiency    Allergies:  Allergies  Allergen Reactions   Simvastatin Other (See Comments)    MYALGIAS   Statins Other (See Comments)    Has severe leg muscle aches and cramps   Bactrim [Sulfamethoxazole -Trimethoprim] Itching   Sertraline  Anxiety     Surgical History:  She  has a past surgical history that includes Partial  hysterectomy; Dilation and curettage of uterus; Cholecystectomy; Breast reduction surgery (1990); Tonsillectomy (1972); Left oophorectomy (1976); Abdominal hysterectomy; Flexible sigmoidoscopy (01/08/2012); Daniels arthrotomy; Colonoscopy; Hemorrhoid surgery; Laminectomy (N/A, 01/31/2017); Eye surgery (Bilateral, 07/30/2017); Cervical disc surgery; Reduction mammaplasty; Neck surgery (N/A, 03/2018); and Upper gastrointestinal endoscopy. Family History:  Her family history includes Breast cancer (age of onset: 44 - 39) in her cousin; Cirrhosis  in her father; Diabetes in her cousin, mother, sister, and another family member; Heart disease in her brother and father; Hypertension in her mother and sister; Other in her son; Stroke in an other family member.  REVIEW OF SYSTEMS  : All other systems reviewed and negative except where noted in the History of Present Illness.  PHYSICAL EXAM: BP 130/70 (BP Location: Left Arm, Patient Position: Sitting, Cuff Size: Large)   Pulse 80 Comment: irregular  Ht 5' 2.5 (1.588 m) Comment: height measured without shoes  Wt 184 lb 4 oz (83.6 kg)   BMI 33.16 kg/m  Physical Exam   GENERAL APPEARANCE: Well nourished, in no apparent distress. HEENT: No cervical lymphadenopathy, unremarkable thyroid , sclerae anicteric, conjunctiva pink. Tenderness over right frontal sinus. RESPIRATORY: Respiratory effort normal, breath sounds equal bilaterally without rales, rhonchi, or wheezing. Lungs clear to auscultation bilaterally. CARDIO: Regular rate and rhythm with no murmurs, rubs, or gallops, peripheral pulses intact. ABDOMEN: Soft, non-distended, active bowel sounds in all four quadrants, non-tender to palpation, no rebound, no mass appreciated. RECTAL: Declines. MUSCULOSKELETAL: Full range of motion, normal gait, without edema. SKIN: Dry, intact without rashes or lesions. Mild scaly rash on back. No jaundice. NEURO: Alert, oriented, no focal deficits. PSYCH: Cooperative, normal  mood and affect. NECK: Sensitivity in cervical lymph nodes.        Nicole JONELLE Coombs, PA-C 9:27 AM

## 2024-08-04 ENCOUNTER — Encounter: Payer: Self-pay | Admitting: Physician Assistant

## 2024-08-04 ENCOUNTER — Ambulatory Visit: Admitting: Physician Assistant

## 2024-08-04 ENCOUNTER — Other Ambulatory Visit

## 2024-08-04 VITALS — BP 130/70 | HR 80 | Ht 62.5 in | Wt 184.2 lb

## 2024-08-04 DIAGNOSIS — Z860101 Personal history of adenomatous and serrated colon polyps: Secondary | ICD-10-CM

## 2024-08-04 DIAGNOSIS — A09 Infectious gastroenteritis and colitis, unspecified: Secondary | ICD-10-CM | POA: Diagnosis not present

## 2024-08-04 DIAGNOSIS — Z86718 Personal history of other venous thrombosis and embolism: Secondary | ICD-10-CM

## 2024-08-04 DIAGNOSIS — K5190 Ulcerative colitis, unspecified, without complications: Secondary | ICD-10-CM

## 2024-08-04 DIAGNOSIS — K51811 Other ulcerative colitis with rectal bleeding: Secondary | ICD-10-CM

## 2024-08-04 DIAGNOSIS — D509 Iron deficiency anemia, unspecified: Secondary | ICD-10-CM

## 2024-08-04 DIAGNOSIS — J329 Chronic sinusitis, unspecified: Secondary | ICD-10-CM | POA: Diagnosis not present

## 2024-08-04 DIAGNOSIS — E559 Vitamin D deficiency, unspecified: Secondary | ICD-10-CM

## 2024-08-04 DIAGNOSIS — E876 Hypokalemia: Secondary | ICD-10-CM

## 2024-08-04 DIAGNOSIS — R110 Nausea: Secondary | ICD-10-CM

## 2024-08-04 DIAGNOSIS — E538 Deficiency of other specified B group vitamins: Secondary | ICD-10-CM

## 2024-08-04 DIAGNOSIS — L299 Pruritus, unspecified: Secondary | ICD-10-CM

## 2024-08-04 DIAGNOSIS — R300 Dysuria: Secondary | ICD-10-CM | POA: Diagnosis not present

## 2024-08-04 LAB — URINALYSIS, ROUTINE W REFLEX MICROSCOPIC
Bilirubin Urine: NEGATIVE
Ketones, ur: NEGATIVE
Leukocytes,Ua: NEGATIVE
Nitrite: NEGATIVE
Specific Gravity, Urine: 1.01 (ref 1.000–1.030)
Total Protein, Urine: NEGATIVE
Urine Glucose: NEGATIVE
Urobilinogen, UA: 0.2 (ref 0.0–1.0)
pH: 7 (ref 5.0–8.0)

## 2024-08-04 LAB — CORTISOL: Cortisol, Plasma: 7.6 ug/dL

## 2024-08-04 LAB — CBC WITH DIFFERENTIAL/PLATELET
Basophils Absolute: 0 K/uL (ref 0.0–0.1)
Basophils Relative: 0.7 % (ref 0.0–3.0)
Eosinophils Absolute: 0.2 K/uL (ref 0.0–0.7)
Eosinophils Relative: 3.4 % (ref 0.0–5.0)
HCT: 39.5 % (ref 36.0–46.0)
Hemoglobin: 13.1 g/dL (ref 12.0–15.0)
Lymphocytes Relative: 35.2 % (ref 12.0–46.0)
Lymphs Abs: 2.3 K/uL (ref 0.7–4.0)
MCHC: 33.2 g/dL (ref 30.0–36.0)
MCV: 90.5 fl (ref 78.0–100.0)
Monocytes Absolute: 0.4 K/uL (ref 0.1–1.0)
Monocytes Relative: 6.4 % (ref 3.0–12.0)
Neutro Abs: 3.6 K/uL (ref 1.4–7.7)
Neutrophils Relative %: 54.3 % (ref 43.0–77.0)
Platelets: 428 K/uL — ABNORMAL HIGH (ref 150.0–400.0)
RBC: 4.36 Mil/uL (ref 3.87–5.11)
RDW: 12.7 % (ref 11.5–15.5)
WBC: 6.6 K/uL (ref 4.0–10.5)

## 2024-08-04 LAB — SEDIMENTATION RATE: Sed Rate: 13 mm/h (ref 0–30)

## 2024-08-04 NOTE — Patient Instructions (Addendum)
 Your provider has requested that you go to the basement level for lab work before leaving today. Press B on the elevator. The lab is located at the first door on the left as you exit the elevator.  You will need to come back a few days before your next infusion to have some more labs drawn.   VISIT SUMMARY:  Today, we discussed your ongoing symptoms and treatment plan for ulcerative colitis, as well as other health concerns including iron deficiency anemia, a viral rash, recurrent sinusitis, and chronic leg swelling. We reviewed your recent experiences with Entyvio and addressed your headaches, nausea, and other symptoms.  YOUR PLAN:  -ULCERATIVE COLITIS: Ulcerative colitis is a chronic condition causing inflammation in the colon. You started Entyvio due to its lower side effect profile, but have experienced headaches, nausea, blurred vision, and fever. We will conduct urine and blood tests, including liver function, white blood cell count, and cortisol levels. You should follow up with an ophthalmologist for your vision issues and your primary care doctor for sinus evaluation. If symptoms persist, we may consider switching to Humira or Remicade.  -IRON DEFICIENCY ANEMIA: Iron deficiency anemia occurs when your body lacks enough iron to produce healthy red blood cells, leading to fatigue and leg discomfort. Your ferritin level is low, indicating this condition. We recommend considering iron supplementation every other day.  -PITYRIASIS ROSEA: Pityriasis rosea is a viral infection that causes a rash and typically resolves on its own. We have provided you with information about this condition.  -RECURRENT ACUTE SINUSITIS: Recurrent acute sinusitis is the repeated inflammation of the sinuses, often due to infections or allergies. You should follow up with your primary care doctor for further evaluation and management.  -CHRONIC LEFT LOWER EXTREMITY SWELLING SECONDARY TO PRIOR DVT: Chronic leg  swelling can occur after a deep vein thrombosis (DVT) due to venous insufficiency. Your recent ultrasound was negative for new clots, indicating that the swelling is likely due to your previous DVT.  INSTRUCTIONS:  Please follow up with an ophthalmologist for your vision issues and your primary care doctor for sinus evaluation. We will conduct urine and blood tests, including liver function, white blood cell count, and cortisol levels. Your next dose of Entyvio is scheduled for November 7th, please come in for blood work before. If your symptoms persist, we may consider switching to Humira or Remicade.

## 2024-08-05 ENCOUNTER — Ambulatory Visit: Admitting: Family

## 2024-08-05 ENCOUNTER — Ambulatory Visit: Payer: Self-pay | Admitting: Physician Assistant

## 2024-08-05 ENCOUNTER — Ambulatory Visit: Payer: Self-pay | Admitting: Family

## 2024-08-05 ENCOUNTER — Ambulatory Visit (INDEPENDENT_AMBULATORY_CARE_PROVIDER_SITE_OTHER)
Admission: RE | Admit: 2024-08-05 | Discharge: 2024-08-05 | Disposition: A | Discharge status: Home / Self Care | Source: Ambulatory Visit | Attending: Family | Admitting: Family

## 2024-08-05 VITALS — BP 122/84 | HR 81 | Temp 98.3°F | Wt 181.4 lb

## 2024-08-05 DIAGNOSIS — H0288A Meibomian gland dysfunction right eye, upper and lower eyelids: Secondary | ICD-10-CM | POA: Diagnosis not present

## 2024-08-05 DIAGNOSIS — H04123 Dry eye syndrome of bilateral lacrimal glands: Secondary | ICD-10-CM | POA: Diagnosis not present

## 2024-08-05 DIAGNOSIS — R600 Localized edema: Secondary | ICD-10-CM | POA: Diagnosis not present

## 2024-08-05 DIAGNOSIS — H0288B Meibomian gland dysfunction left eye, upper and lower eyelids: Secondary | ICD-10-CM | POA: Diagnosis not present

## 2024-08-05 DIAGNOSIS — J0101 Acute recurrent maxillary sinusitis: Secondary | ICD-10-CM

## 2024-08-05 DIAGNOSIS — R0609 Other forms of dyspnea: Secondary | ICD-10-CM | POA: Diagnosis not present

## 2024-08-05 DIAGNOSIS — R7989 Other specified abnormal findings of blood chemistry: Secondary | ICD-10-CM

## 2024-08-05 DIAGNOSIS — M25572 Pain in left ankle and joints of left foot: Secondary | ICD-10-CM | POA: Diagnosis not present

## 2024-08-05 DIAGNOSIS — H16223 Keratoconjunctivitis sicca, not specified as Sjogren's, bilateral: Secondary | ICD-10-CM | POA: Diagnosis not present

## 2024-08-05 DIAGNOSIS — M7989 Other specified soft tissue disorders: Secondary | ICD-10-CM | POA: Diagnosis not present

## 2024-08-05 DIAGNOSIS — E876 Hypokalemia: Secondary | ICD-10-CM

## 2024-08-05 DIAGNOSIS — G4719 Other hypersomnia: Secondary | ICD-10-CM

## 2024-08-05 DIAGNOSIS — M79604 Pain in right leg: Secondary | ICD-10-CM

## 2024-08-05 DIAGNOSIS — H16141 Punctate keratitis, right eye: Secondary | ICD-10-CM | POA: Diagnosis not present

## 2024-08-05 LAB — BASIC METABOLIC PANEL WITH GFR
BUN: 10 mg/dL (ref 6–23)
CO2: 28 meq/L (ref 19–32)
Calcium: 9.6 mg/dL (ref 8.4–10.5)
Chloride: 105 meq/L (ref 96–112)
Creatinine, Ser: 0.76 mg/dL (ref 0.40–1.20)
GFR: 77.19 mL/min (ref >60.00)
Glucose, Bld: 133 mg/dL — ABNORMAL HIGH (ref 70–99)
Potassium: 2.9 meq/L — ABNORMAL LOW (ref 3.5–5.1)
Sodium: 144 meq/L (ref 135–145)

## 2024-08-05 LAB — URINE CULTURE
MICRO NUMBER:: 17060881
Result:: NO GROWTH
SPECIMEN QUALITY:: ADEQUATE

## 2024-08-05 LAB — HEPATIC FUNCTION PANEL
ALT: 16 U/L (ref 0–35)
AST: 20 U/L (ref 0–37)
Albumin: 3.9 g/dL (ref 3.5–5.2)
Alkaline Phosphatase: 61 U/L (ref 39–117)
Bilirubin, Direct: 0 mg/dL (ref 0.0–0.3)
Total Bilirubin: 0.4 mg/dL (ref 0.2–1.2)
Total Protein: 6.4 g/dL (ref 6.0–8.3)

## 2024-08-05 LAB — BRAIN NATRIURETIC PEPTIDE: Pro B Natriuretic peptide (BNP): 114 pg/mL — ABNORMAL HIGH (ref 0.0–100.0)

## 2024-08-05 LAB — C-REACTIVE PROTEIN: CRP: <0.5 mg/dL (ref 0.5–20.0)

## 2024-08-05 LAB — EXTRA URINE SPECIMEN

## 2024-08-05 MED ORDER — AMOXICILLIN-POT CLAVULANATE 875-125 MG PO TABS: 1.0000 | ORAL_TABLET | Freq: Two times a day (BID) | ORAL | 0 refills | Status: DC

## 2024-08-05 MED ORDER — POTASSIUM CHLORIDE CRYS ER 20 MEQ PO TBCR
EXTENDED_RELEASE_TABLET | ORAL | 0 refills | Status: DC
Start: 2024-08-05 — End: 2024-09-18

## 2024-08-05 NOTE — Progress Notes (Signed)
 Established Patient Office Visit  Subjective:      CC:  Chief Complaint  Patient presents with   Follow-up    GI wanted pt's sinuses checked. Pt is doing Entyvio infusions and needs to make sure she doesn't have a sinus infection.    HPI: Nicole Daniels is a 73 y.o. female presenting on 08/05/2024 for Follow-up (GI wanted pt's sinuses checked. Pt is doing Entyvio infusions and needs to make sure she doesn't have a sinus infection.) .  Discussed the use of AI scribe software for clinical note transcription with the patient, who gave verbal consent to proceed.  History of Present Illness Nicole Daniels is a 73 year old female with Crohn's disease who presents with headaches, blurred vision, and swelling in the left foot.  She has been experiencing headaches and blurred vision, primarily in the right eye, which led to a visit to an eye doctor. Eye drops were prescribed to be used three to four times a day for two weeks. The headaches are located over the right eye, with tenderness in the area.  She experienced a low-grade fever on three different days last week. Significant itching kept her awake one night, although she did not notice a rash herself.  She describes persistent fatigue, which she finds 'phenomenal' and impacting her daily life. She also experiences nausea and occasional sore throat in the mornings, which resolves as she becomes more active. Her mood has been affected, feeling less inclined to engage socially, partly due to concerns about her immune system being compromised by her medication.  She has been experiencing swelling in her left foot for about a month, with worsening symptoms over the past week and a half. The swelling is significant enough to prevent her from wearing closed shoes comfortably, and she describes pain shooting up her leg. Elevating her foot when sitting provides some relief. She has a history of a blood clot, although a recent  ultrasound showed no deep vein thrombosis.  She has been off prednisone  for about two and a half weeks and is currently on Entyvio, with her next dose scheduled in four weeks. She is not currently on a blood thinner, having stopped it at the end of August. She takes Tylenol  for pain and has been using Crystal Light to reduce caffeine intake from sweet tea.      Wt Readings from Last 3 Encounters:  08/05/24 181 lb 6.4 oz (82.3 kg)  08/04/24 184 lb 4 oz (83.6 kg)  07/30/24 184 lb 3.2 oz (83.6 kg)      Social history:  Relevant past medical, surgical, family and social history reviewed and updated as indicated. Interim medical history since our last visit reviewed.  Allergies and medications reviewed and updated.  DATA REVIEWED: CHART IN EPIC     ROS: Negative unless specifically indicated above in HPI.    Current Outpatient Medications:    acetaminophen  (TYLENOL ) 325 MG tablet, Take 650 mg by mouth every 6 (six) hours as needed., Disp: , Rfl:    amoxicillin -clavulanate (AUGMENTIN ) 875-125 MG tablet, Take 1 tablet by mouth 2 (two) times daily., Disp: 20 tablet, Rfl: 0   chlorpheniramine  (ALLERGY RELIEF) 4 MG tablet, Take 1 tablet (4 mg total) by mouth 2 (two) times daily as needed for allergies., Disp: , Rfl:    Cholecalciferol  (VITAMIN D3) 50 MCG (2000 UT) capsule, Take 2,000 Units by mouth every other day., Disp: , Rfl:    cyanocobalamin  (VITAMIN B12) 500 MCG tablet,  Take 500 mcg by mouth daily., Disp: , Rfl:    diphenhydrAMINE  (DIPHENHIST) 25 mg capsule, Take 25 mg by mouth every 6 (six) hours as needed (Prior to infusion)., Disp: , Rfl:    ENTYVIO 300 MG injection, Inject 300 mg into the vein every 6 (six) weeks., Disp: , Rfl:    ezetimibe  (ZETIA ) 10 MG tablet, Take 1 tablet (10 mg total) by mouth daily., Disp: 90 tablet, Rfl: 3   fluticasone  (FLONASE ) 50 MCG/ACT nasal spray, Place 1 spray into both nostrils as needed for allergies. , Disp: , Rfl:    levothyroxine  (SYNTHROID )  75 MCG tablet, Take 1 tablet (75 mcg total) by mouth daily., Disp: 90 tablet, Rfl: 3   potassium chloride  SA (KLOR-CON  M) 20 MEQ tablet, Take 1 tablet (20 mEq total) by mouth 2 (two) times daily for 3 days, THEN 1 tablet (20 mEq total) daily for 4 days., Disp: 10 tablet, Rfl: 0        Objective:      BP 122/84 (BP Location: Left Arm, Patient Position: Sitting, Cuff Size: Large)   Pulse 81   Temp 98.3 F (36.8 C) (Temporal)   Wt 181 lb 6.4 oz (82.3 kg)   SpO2 98%   BMI 32.65 kg/m   Physical Exam HEENT: Fluid in ears. Tenderness in right sinus duct. Tenderness in facial sinuses. EXTREMITIES: Swelling in left foot.  Wt Readings from Last 3 Encounters:  08/05/24 181 lb 6.4 oz (82.3 kg)  08/04/24 184 lb 4 oz (83.6 kg)  07/30/24 184 lb 3.2 oz (83.6 kg)    Physical Exam Constitutional:      General: She is not in acute distress.    Appearance: Normal appearance. She is normal weight. She is not ill-appearing, toxic-appearing or diaphoretic.  HENT:     Head: Normocephalic.     Right Ear: A middle ear effusion is present.     Left Ear: A middle ear effusion is present.     Nose:     Right Sinus: Maxillary sinus tenderness present.     Mouth/Throat:     Mouth: Mucous membranes are dry.     Pharynx: No oropharyngeal exudate or posterior oropharyngeal erythema.  Eyes:     Extraocular Movements: Extraocular movements intact.     Pupils: Pupils are equal, round, and reactive to light.  Cardiovascular:     Rate and Rhythm: Normal rate and regular rhythm.     Pulses: Normal pulses.     Heart sounds: Normal heart sounds.  Pulmonary:     Effort: Pulmonary effort is normal.     Breath sounds: Normal breath sounds.  Musculoskeletal:     Cervical back: Normal range of motion.     Right lower leg: 1+ Edema present.     Comments: Left foot with anterior foot swelling and left ankle swelling 2+ pitting edema   Neurological:     General: No focal deficit present.     Mental Status:  She is alert and oriented to person, place, and time. Mental status is at baseline.  Psychiatric:        Mood and Affect: Mood normal.        Behavior: Behavior normal.        Thought Content: Thought content normal.        Judgment: Judgment normal.          Results LABS Potassium: 2.9 mmol/L (08/04/2024) Platelets: within normal limits (08/04/2024)  RADIOLOGY Left lower extremity ultrasound: No deep vein  thrombosis (DVT) (07/30/2024)  Assessment & Plan:   Assessment and Plan Assessment & Plan Left-sided ulcerative colitis on immunosuppressive therapy (Entyvio) Currently on Entyvio for left-sided ulcerative colitis with side effects including fatigue, low-grade fever, and mood changes. Increased risk of infections due to immunosuppression, though Entyvio is less systemically immunosuppressive compared to other biologics. - Continue Entyvio as scheduled - Advise to monitor for symptoms of infection - Encourage wearing a mask in crowded places to reduce infection risk  Left foot and ankle swelling and pain Swelling and pain in the left foot and ankle for about a month, worsening in the last two weeks. No deep vein thrombosis on recent ultrasound. Possible arthritic changes considered. Pain and swelling interfere with wearing shoes. - Order x-ray of left foot and ankle - Order BNP test to rule out heart failure - Advise use of an ankle brace or ace bandage for stability - Consider referral to Dr. Ubaldo for further evaluation if pain persists  Fatigue, evaluation for sleep apnea and other causes Persistent fatigue possibly related to sleep apnea. Previous sleep study over five years ago. Fatigue may also be related to immune system issues or other underlying conditions. - Order home sleep study to evaluate for sleep apnea  Blurred vision, right eye Blurred vision in the right eye, worsening over the last month. Eye doctor prescribed drops for dry eye. - Use prescribed eye  drops three to four times daily for two weeks - Follow up with eye doctor if symptoms do not improve  Headache Headaches associated with blurred vision and tenderness in the sinus area. Possible sinusitis considered.  Possible acute sinusitis Tenderness in sinus area and fluid in ears suggest possible sinusitis. Augmentin  considered for treatment, but potential GI side effects noted. - Prescribe Augmentin  for sinusitis - Switch to doxycycline  if GI side effects occur  Possible urinary tract infection Pending urine culture. Augmentin  chosen to cover both sinusitis and potential UTI. - Prescribe Augmentin  for possible UTI - Switch to doxycycline  if GI side effects occur  Nausea  Itching and rash, upper back Itching and light rash noted on upper back.  Unsteadiness on feet and abnormal gait Unsteadiness and abnormal gait possibly related to fatigue or other underlying conditions.  General Health Maintenance Discussion on lifestyle modifications to reduce infection risk due to immunosuppressive therapy. - Encourage wearing a mask in crowded places - Advise on general hygiene practices to prevent infections  Follow-Up Follow-up plans discussed for various conditions and tests. - Follow up with results of home sleep study - Follow up with eye doctor if vision does not improve - Follow up with Dr. Ubaldo if foot pain persists - Monitor for symptoms of infection and report promptly  Recording duration: 23 minutes      Return in about 3 months (around 11/05/2024) for f/u CPE.     Ginger Patrick, MSN, APRN, FNP-C  [facility]

## 2024-08-06 ENCOUNTER — Other Ambulatory Visit

## 2024-08-06 DIAGNOSIS — R110 Nausea: Secondary | ICD-10-CM

## 2024-08-06 DIAGNOSIS — E876 Hypokalemia: Secondary | ICD-10-CM | POA: Diagnosis not present

## 2024-08-06 DIAGNOSIS — E538 Deficiency of other specified B group vitamins: Secondary | ICD-10-CM | POA: Diagnosis not present

## 2024-08-06 DIAGNOSIS — L299 Pruritus, unspecified: Secondary | ICD-10-CM

## 2024-08-06 DIAGNOSIS — K51811 Other ulcerative colitis with rectal bleeding: Secondary | ICD-10-CM | POA: Diagnosis not present

## 2024-08-06 DIAGNOSIS — J329 Chronic sinusitis, unspecified: Secondary | ICD-10-CM | POA: Diagnosis not present

## 2024-08-07 MED ORDER — FUROSEMIDE 20 MG PO TABS: 20.0000 mg | ORAL_TABLET | Freq: Every day | ORAL | 0 refills | Status: DC

## 2024-08-08 ENCOUNTER — Encounter: Payer: Self-pay | Admitting: *Deleted

## 2024-08-08 NOTE — Telephone Encounter (Signed)
 Pt notified as instructed and pt voiced understanding. Pt said on 07/30/24 had US  lt lower leg and negative for DVT; does tabitha want that repeated. Pt has not started furosemide yet but plans to pick up med today, this AM minimal swelling and pt said she can see her ankles today. Pt has been up on feet for at least 1 hr and still no swelling in ankles. Pt has no CP, SOB with just slight sharp pain on and off in both upper legs on and off; pain only last few seconds.pt cannot give amt of time in between pain in upper legs.pt said she does still have some pain in lt lower leg. Pt could not give me a pain level. Pt has appt with Tabitha on 08/11/24 at 11:20 with UC& ED precautions and  pt voiced understanding. I spoke with [place]; Ginger said if symptoms are improving pt does not need to repeat US  lt lower leg at this time.Ginger was being cautious due to pts hx of DVT when she wanted to order the US  of lt lower leg. Reinforced UC & ED precautions and pt voiced understanding. Pt will get furosemide and take as instructed and will discuss how long should take furosemide at appt on 08/11/24.When sitting pt will also elevate legs. Sending note to Ginger Patrick FNP and [place].

## 2024-08-08 NOTE — Telephone Encounter (Signed)
 Can we please call pt asap I want to order a stat u/s to rule out a blood clot I have placed this order with [facility]   Have her call to schedule  [facility] outpatient imaging center [address], [place] [address] Phone [phone]-  8-5 pm   Let her know elevated BNP with lower extremity swelling can be suggestion of congestive heart failure. Did she start taking the furosemide yet? Has their been any improvement in her swelling at all? She did have an echo 8/26 which was reassuring. Make sure she has a f/u early next week with me or another provider to monitor her swelling and repeat potassium.    If she has any sudden sob or chest pain needs to go to the ER.

## 2024-08-11 ENCOUNTER — Encounter: Payer: Self-pay | Admitting: Family

## 2024-08-11 ENCOUNTER — Telehealth: Payer: Self-pay

## 2024-08-11 ENCOUNTER — Ambulatory Visit: Admitting: Family

## 2024-08-11 VITALS — BP 134/74 | HR 94 | Temp 98.7°F | Ht 65.0 in | Wt 182.8 lb

## 2024-08-11 DIAGNOSIS — R7989 Other specified abnormal findings of blood chemistry: Secondary | ICD-10-CM | POA: Diagnosis not present

## 2024-08-11 DIAGNOSIS — R600 Localized edema: Secondary | ICD-10-CM

## 2024-08-11 DIAGNOSIS — E876 Hypokalemia: Secondary | ICD-10-CM

## 2024-08-11 LAB — BASIC METABOLIC PANEL WITH GFR
BUN: 13 mg/dL (ref 6–23)
CO2: 31 meq/L (ref 19–32)
Calcium: 9.7 mg/dL (ref 8.4–10.5)
Chloride: 103 meq/L (ref 96–112)
Creatinine, Ser: 1.01 mg/dL (ref 0.40–1.20)
GFR: 54.86 mL/min — ABNORMAL LOW (ref >60.00)
Glucose, Bld: 102 mg/dL — ABNORMAL HIGH (ref 70–99)
Potassium: 3.7 meq/L (ref 3.5–5.1)
Sodium: 141 meq/L (ref 135–145)

## 2024-08-11 LAB — MAGNESIUM: Magnesium: 1.9 mg/dL (ref 1.5–2.5)

## 2024-08-11 NOTE — Telephone Encounter (Signed)
-----   Message from Nurse [place] B sent at 08/05/2024 12:08 PM EDT ----- Regarding: Lab reminder Needs labs today

## 2024-08-11 NOTE — Telephone Encounter (Signed)
 Reminder was received in Epic.  Pt made aware for the request for labs.  Pt stated that her PCP was requesting labs and that she is going there today. Chart was reviewed and noted that her PCP is requesting a BMP. Pt was notified to request that a magnesium  be drawn as well. Pt verbalized understanding with all questions answered.

## 2024-08-11 NOTE — Telephone Encounter (Signed)
 Noted thanks so much rena.

## 2024-08-11 NOTE — Progress Notes (Unsigned)
 Established Patient Office Visit  Subjective:      CC:  Chief Complaint  Patient presents with   Follow-up    1 week follow up    HPI: Nicole Daniels is a 73 y.o. female presenting on 08/11/2024 for Follow-up (1 week follow up) .  Discussed the use of AI scribe software for clinical note transcription with the patient, who gave verbal consent to proceed.  History of Present Illness Nicole Daniels is a 73 year old female who presents with swelling in the left foot and ankle.  She has experienced significant improvement in the swelling of her left foot and ankle over the past three days, which she attributes to the initiation of furosemide. The swelling was previously more pronounced in the mornings. An ultrasound ruled out deep vein thrombosis, and an x-ray was performed. She continues to take furosemide to manage the swelling.  She has been experiencing headaches and blurred vision in the right eye, which have shown some improvement with the use of prescribed eye drops. The symptoms are better but not completely resolved, and she can tell when the effect of the drops is wearing off.  No fever currently, although she had a low-grade fever previously. She experiences occasional itching, described as feeling like a pinch that makes her want to scratch, but denies any stinging or burning sensation.  She describes her fatigue as chronic but notes that she had a good day on Friday with enough energy to perform activities. However, she felt more tired after doing activities on Saturday and rested on Sunday. She feels that her energy levels are improving, possibly due to the prednisone  leaving her system. She is currently taking Augmentin  for sinus issues and reports no stomach problems with this medication.  She experiences shortness of breath primarily when walking down stairs or after physical activities like putting up a tree, which requires her to take breaks. An  echocardiogram was performed on June 24, 2024. She is also on a potassium supplement, which she started before her gastrointestinal issues.  She mentions that she was supposed to have her potassium and magnesium  levels checked at another location but was informed that these could be done at her current visit.         Social history:  Relevant past medical, surgical, family and social history reviewed and updated as indicated. Interim medical history since our last visit reviewed.  Allergies and medications reviewed and updated.  DATA REVIEWED: CHART IN EPIC     ROS: Negative unless specifically indicated above in HPI.    Current Outpatient Medications:    acetaminophen  (TYLENOL ) 325 MG tablet, Take 650 mg by mouth every 6 (six) hours as needed., Disp: , Rfl:    amoxicillin -clavulanate (AUGMENTIN ) 875-125 MG tablet, Take 1 tablet by mouth 2 (two) times daily., Disp: 20 tablet, Rfl: 0   chlorpheniramine  (ALLERGY RELIEF) 4 MG tablet, Take 1 tablet (4 mg total) by mouth 2 (two) times daily as needed for allergies., Disp: , Rfl:    Cholecalciferol  (VITAMIN D3) 50 MCG (2000 UT) capsule, Take 2,000 Units by mouth every other day., Disp: , Rfl:    cyanocobalamin  (VITAMIN B12) 500 MCG tablet, Take 500 mcg by mouth daily., Disp: , Rfl:    diphenhydrAMINE  (DIPHENHIST) 25 mg capsule, Take 25 mg by mouth every 6 (six) hours as needed (Prior to infusion)., Disp: , Rfl:    ENTYVIO 300 MG injection, Inject 300 mg into the vein every 6 (six) weeks.,  Disp: , Rfl:    ezetimibe  (ZETIA ) 10 MG tablet, Take 1 tablet (10 mg total) by mouth daily., Disp: 90 tablet, Rfl: 3   fluticasone  (FLONASE ) 50 MCG/ACT nasal spray, Place 1 spray into both nostrils as needed for allergies. , Disp: , Rfl:    furosemide (LASIX) 20 MG tablet, Take 1 tablet (20 mg total) by mouth daily. Take one tablet po daily x 3 days, then take daily prn swelling lower legs, Disp: 20 tablet, Rfl: 0   levothyroxine  (SYNTHROID ) 75 MCG  tablet, Take 1 tablet (75 mcg total) by mouth daily., Disp: 90 tablet, Rfl: 3   potassium chloride  SA (KLOR-CON  M) 20 MEQ tablet, Take 1 tablet (20 mEq total) by mouth 2 (two) times daily for 3 days, THEN 1 tablet (20 mEq total) daily for 4 days., Disp: 10 tablet, Rfl: 0        Objective:        BP 134/74 (BP Location: Left Arm, Patient Position: Sitting, Cuff Size: Large)   Pulse 94   Temp 98.7 F (37.1 C) (Temporal)   Ht 5' 5 (1.651 m)   Wt 182 lb 12.8 oz (82.9 kg)   SpO2 98%   BMI 30.42 kg/m   Physical Exam EXTREMITIES: No pitting edema.  Wt Readings from Last 3 Encounters:  08/11/24 182 lb 12.8 oz (82.9 kg)  08/05/24 181 lb 6.4 oz (82.3 kg)  08/04/24 184 lb 4 oz (83.6 kg)    Physical Exam Vitals reviewed.  Constitutional:      General: She is not in acute distress.    Appearance: Normal appearance. She is normal weight. She is not ill-appearing, toxic-appearing or diaphoretic.  HENT:     Head: Normocephalic.  Cardiovascular:     Rate and Rhythm: Normal rate and regular rhythm.  Pulmonary:     Effort: Pulmonary effort is normal.     Breath sounds: Normal breath sounds.  Musculoskeletal:        General: Normal range of motion.     Right lower leg: No edema.     Left lower leg: 1+ Edema present.  Neurological:     General: No focal deficit present.     Mental Status: She is alert and oriented to person, place, and time. Mental status is at baseline.  Psychiatric:        Mood and Affect: Mood normal.        Behavior: Behavior normal.        Thought Content: Thought content normal.        Judgment: Judgment normal.          Results DIAGNOSTIC Echocardiogram: Ejection fraction 55-60%, normal left ventricular function, mild left ventricular hypertrophy, grade 1 diastolic dysfunction, trivial mitral regurgitation, aortic valve calcification without stenosis (06/24/2024)  Assessment & Plan:   Assessment and Plan Assessment & Plan Left foot and ankle  swelling and pain Swelling has improved significantly, likely due to furosemide. No evidence of DVT on ultrasound. Echocardiogram from June 24, 2024, showed good heart function with an ejection fraction of 55-60%. Possible fluid retention exacerbated by prednisone . No significant shortness of breath reported, except when walking down steps. - Continue furosemide as needed for swelling. - Monitor for weight gain of more than 2 pounds in one day or more than 3 pounds in one week, and report if this occurs. - Monitor for increasing shortness of breath and report if it occurs. - Repeat potassium and magnesium  levels.  Fatigue Chronic fatigue showed improvement on  Friday. Possible contribution from fluid retention and sinusitis. Awaiting results from a sleep study to assess for other contributing factors. - Proceed with sleep study to evaluate for potential causes of fatigue.  Blurred vision, right eye Blurred vision is improving with prescribed eye drops. Follow-up with ophthalmologist planned if symptoms do not continue to improve. - Continue using prescribed eye drops. - Follow up with ophthalmologist in two weeks if symptoms do not improve.  Possible acute sinusitis Symptoms improving with Augmentin . No fever reported. - Continue Augmentin  as prescribed.  Left-sided ulcerative colitis  Hypertension with left ventricular hypertrophy and grade 1 diastolic dysfunction Echocardiogram showed good heart function with mild left ventricular hypertrophy and grade 1 diastolic dysfunction, common in a 73 year old. Blood pressure is well controlled. - Continue current management of hypertension.  Trivial mitral valve regurgitation and aortic valve calcification without stenosis Echocardiogram showed trivial mitral valve regurgitation and aortic valve calcification without stenosis. Focus on cholesterol management and healthy lifestyle to prevent progression. - Continue cholesterol management and  healthy lifestyle.  Hypokalemia on potassium supplementation and possible hypomagnesemia Currently on potassium supplementation. Possible hypomagnesemia to be evaluated. - Repeat potassium and magnesium  levels.  Recording duration: 11 minutes      Return in about 6 months (around 02/09/2025) for f/u CPE.     Ginger Patrick, MSN, APRN, FNP-C [facility]

## 2024-08-12 ENCOUNTER — Ambulatory Visit: Payer: Self-pay | Admitting: Family

## 2024-08-12 DIAGNOSIS — R600 Localized edema: Secondary | ICD-10-CM

## 2024-08-12 DIAGNOSIS — I8311 Varicose veins of right lower extremity with inflammation: Secondary | ICD-10-CM

## 2024-08-12 NOTE — Telephone Encounter (Signed)
 Routed as FYI. Lab results are in Epic.

## 2024-08-14 LAB — CALPROTECTIN: Calprotectin: 126 ug/g — ABNORMAL HIGH

## 2024-08-27 ENCOUNTER — Ambulatory Visit: Payer: Medicare PPO

## 2024-08-27 ENCOUNTER — Encounter: Payer: Self-pay | Admitting: Family

## 2024-08-27 VITALS — BP 126/78 | Ht 65.0 in | Wt 185.2 lb

## 2024-08-27 DIAGNOSIS — Z0000 Encounter for general adult medical examination without abnormal findings: Secondary | ICD-10-CM

## 2024-08-27 NOTE — Patient Instructions (Addendum)
 Nicole Daniels,  Thank you for taking the time for your Medicare Wellness Visit. I appreciate your continued commitment to your health goals. Please review the care plan we discussed, and feel free to reach out if I can assist you further.  Medicare recommends these wellness visits once per year to help you and your care team stay ahead of potential health issues. These visits are designed to focus on prevention, allowing your provider to concentrate on managing your acute and chronic conditions during your regular appointments.  Please note that Annual Wellness Visits do not include a physical exam. Some assessments may be limited, especially if the visit was conducted virtually. If needed, we may recommend a separate in-person follow-up with your provider.  Ongoing Care Seeing your primary care provider every 3 to 6 months helps us  monitor your health and provide consistent, personalized care.   Referrals If a referral was made during today's visit and you haven't received any updates within two weeks, please contact the referred provider directly to check on the status.  Please call to schedule your Dexa Scan at your convenience. The referral is in place:  You have an order for:  []   2D Mammogram  []   3D Mammogram  [x]   Bone Density     Please call for appointment:  [facility]  [address] [place] [address] [phone]  [facility] [address] [place], [place] [address] [phone]  [facility] [address] [place], [place] [address] [phone]   [facility] [address] [place], [place] [address] [phone]  [facility] [address] [place], [place] [address] [phone]  [facility] [address] [place], [place]  [address] [phone]  [facility] [place] Bone Density [address] [place], [place] [address] [phone]  [facility] [address] [place], [place] [address] [phone]       Recommended Screenings:  Health Maintenance  Topic Date Due   Zoster (Shingles) Vaccine (1 of 2) Never done   Pneumococcal Vaccine for age over 3 (2 of 2 - PCV) 05/30/2023   DEXA scan (bone density measurement)  10/07/2023   COVID-19 Vaccine (3 - 2025-26 season) 06/30/2024   Flu Shot  01/27/2025*   Medicare Annual Wellness Visit  08/27/2025   Breast Cancer Screening  10/21/2025   Colon Cancer Screening  05/07/2026   Hepatitis C Screening  Completed   Meningitis B Vaccine  Aged Out   DTaP/Tdap/Td vaccine  Discontinued   Hepatitis B Vaccine  Discontinued  *Topic was postponed. The date shown is not the original due date.       06/24/2024   12:57 AM  Advanced Directives  Does Patient Have a Medical Advance Directive? No  Would patient like information on creating a medical advance directive? No - Patient declined   Advance Care Planning is important because it: Ensures you receive medical care that aligns with your values, goals, and preferences. Provides guidance to your family and loved ones, reducing the emotional burden of decision-making during critical moments.  Vision: Annual vision screenings are recommended for early detection of glaucoma, cataracts, and diabetic retinopathy. These exams can also reveal signs of chronic conditions such as diabetes and high blood pressure.  Dental: Annual dental screenings help detect early signs of oral  cancer, gum disease, and other conditions linked to overall health, including heart disease and diabetes.

## 2024-08-27 NOTE — Progress Notes (Signed)
 Please attest and cosign this visit due to patients primary care provider not being in the office at the time the visit was completed.    Subjective:   Nicole Daniels is a 73 y.o. who presents for a Medicare Wellness preventive visit.  As a reminder, Annual Wellness Visits don't include a physical exam, and some assessments may be limited, especially if this visit is performed virtually. We may recommend an in-person follow-up visit with your provider if needed.  Visit Complete: In person  Persons Participating in Visit: Patient.  AWV Questionnaire: No: Patient Medicare AWV questionnaire was not completed prior to this visit.  Cardiac Risk Factors include: advanced age (>47men, >70 women);dyslipidemia;obesity (BMI >30kg/m2)     Objective:    Today's Vitals   08/27/24 0935 08/27/24 0936  BP:  126/78  Weight: 185 lb 3.2 oz (84 kg)   Height: 5' 5 (1.651 m)   PainSc:  8    Body mass index is 30.82 kg/m.     08/27/2024    9:59 AM 06/24/2024   12:57 AM 06/13/2024    6:20 PM 03/06/2024    4:18 PM 02/07/2024    5:52 PM 01/23/2024    1:09 PM 01/19/2024    5:03 PM  Advanced Directives  Does Patient Have a Medical Advance Directive? Yes No No Yes No Yes No  Type of Estate Agent of [place];Living will   Healthcare Power of [place];Living will  Healthcare Power of [place];Living will   Copy of Healthcare Power of Attorney in Chart? Yes - validated most recent copy scanned in chart (See row information)        Would patient like information on creating a medical advance directive?  No - Patient declined   No - Patient declined      Current Medications (verified) Outpatient Encounter Medications as of 08/27/2024  Medication Sig   acetaminophen  (TYLENOL ) 325 MG tablet Take 650 mg by mouth every 6 (six) hours as needed.   chlorpheniramine  (ALLERGY RELIEF) 4 MG tablet Take 1 tablet (4 mg total) by mouth 2 (two) times daily as needed for allergies.    Cholecalciferol  (VITAMIN D3) 50 MCG (2000 UT) capsule Take 2,000 Units by mouth every other day.   cyanocobalamin  (VITAMIN B12) 500 MCG tablet Take 500 mcg by mouth daily.   diphenhydrAMINE  (DIPHENHIST) 25 mg capsule Take 25 mg by mouth every 6 (six) hours as needed (Prior to infusion).   ENTYVIO 300 MG injection Inject 300 mg into the vein every 6 (six) weeks.   ezetimibe  (ZETIA ) 10 MG tablet Take 1 tablet (10 mg total) by mouth daily.   fluticasone  (FLONASE ) 50 MCG/ACT nasal spray Place 1 spray into both nostrils as needed for allergies.    furosemide (LASIX) 20 MG tablet Take 1 tablet (20 mg total) by mouth daily. Take one tablet po daily x 3 days, then take daily prn swelling lower legs   levothyroxine  (SYNTHROID ) 75 MCG tablet Take 1 tablet (75 mcg total) by mouth daily.   potassium chloride  SA (KLOR-CON  M) 20 MEQ tablet Take 1 tablet (20 mEq total) by mouth 2 (two) times daily for 3 days, THEN 1 tablet (20 mEq total) daily for 4 days.   amoxicillin -clavulanate (AUGMENTIN ) 875-125 MG tablet Take 1 tablet by mouth 2 (two) times daily. (Patient not taking: Reported on 08/27/2024)   No facility-administered encounter medications on file as of 08/27/2024.    Allergies (verified) Simvastatin, Statins, Bactrim [sulfamethoxazole -trimethoprim], and Sertraline    History: Past  Medical History:  Diagnosis Date   Abdominal pain, right lower quadrant    Acquired cyst of kidney 01/08/2012   pt denies history of    Acute sinusitis, unspecified    Adenomatous colon polyp    Allergy    Angiomyolipoma    kidney   Anxiety    OCCASIONAL   Cataract    Chest pain    Chest pain, unspecified    Depressive disorder, not elsewhere classified 01/08/2012   not currently    Diverticulosis    Dizziness and giddiness    DVT (deep venous thrombosis) (HCC)    Dysfunction of eustachian tube    H/O Spinal surgery 02/20/2017   Hemorrhoids    History of diverticulitis of colon    History of fusion of  cervical spine 04/25/2018   IBS (irritable bowel syndrome)    Liver lesion 2011   Neoplasm of skin of face    malignant   OA (osteoarthritis)    knee, NECK   Obesity, unspecified    Ostium secundum type atrial septal defect    Other and unspecified hyperlipidemia    Other malaise and fatigue    Palpitations    Pulmonary embolism (HCC)    Shortness of breath    Thyroid  disease    Ulcerative colitis (HCC)    Unspecified essential hypertension    pt states she does not have HTN   Unspecified sinusitis (chronic)    Unspecified vitamin D  deficiency    Past Surgical History:  Procedure Laterality Date   ABDOMINAL HYSTERECTOMY     BREAST REDUCTION SURGERY  1990   CERVICAL DISC SURGERY     Dr.Nudleman   CHOLECYSTECTOMY     COLONOSCOPY     DILATION AND CURETTAGE OF UTERUS     EYE SURGERY Bilateral 07/30/2017   cataract sx   FLEXIBLE SIGMOIDOSCOPY  01/08/2012   Procedure: FLEXIBLE SIGMOIDOSCOPY;  Surgeon: Princella CHRISTELLA Nida, MD;  Location: [facility];  Service: Endoscopy;  Laterality: N/A;   HEMORRHOID SURGERY     KNEE ARTHROTOMY     pt states no    LAMINECTOMY N/A 01/31/2017   Procedure: Thoracic Laminectomy and marsupialization of arachnoid cyst;  Surgeon: Lamar Peaches, MD;  Location: [facility] OR;  Service: Neurosurgery;  Laterality: N/A;   LEFT OOPHORECTOMY  1976   NECK SURGERY N/A 03/2018   PARTIAL HYSTERECTOMY     right ovary remains   REDUCTION MAMMAPLASTY     TONSILLECTOMY  1972   UPPER GASTROINTESTINAL ENDOSCOPY     Family History  Problem Relation Age of Onset   Hypertension Mother    Diabetes Mother    Cirrhosis Father    Heart disease Father    Hypertension Sister    Diabetes Sister    Heart disease Brother        x 2   Breast cancer Cousin 90 - 39   Diabetes Cousin    Diabetes Other        aunt   Stroke Other        aunt   Other Son        brain tumor, age 60   Colon cancer Neg Hx    Esophageal cancer Neg Hx    Stomach cancer Neg Hx    Rectal cancer Neg  Hx    Colon polyps Neg Hx    Social History   Socioeconomic History   Marital status: Widowed    Spouse name: Not on file   Number of children: 2  Years of education: Not on file   Highest education level: 12th grade  Occupational History   Occupation: retired  Tobacco Use   Smoking status: Never   Smokeless tobacco: Never  Vaping Use   Vaping status: Never Used  Substance and Sexual Activity   Alcohol use: No   Drug use: No   Sexual activity: Yes    Partners: Male  Other Topics Concern   Not on file  Social History Narrative   Daily caffeine    Social Drivers of Health   Financial Resource Strain: Low Risk  (08/27/2024)   Overall Financial Resource Strain (CARDIA)    Difficulty of Paying Living Expenses: Not hard at all  Food Insecurity: No Food Insecurity (08/27/2024)   Hunger Vital Sign    Worried About Running Out of Food in the Last Year: Never true    Ran Out of Food in the Last Year: Never true  Transportation Needs: No Transportation Needs (08/27/2024)   PRAPARE - Administrator, Civil Service (Medical): No    Lack of Transportation (Non-Medical): No  Physical Activity: Insufficiently Active (08/27/2024)   Exercise Vital Sign    Days of Exercise per Week: 4 days    Minutes of Exercise per Session: 20 min  Stress: No Stress Concern Present (08/27/2024)   [employer] of Occupational Health - Occupational Stress Questionnaire    Feeling of Stress: Not at all  Recent Concern: Stress - Stress Concern Present (08/10/2024)   [employer] of Occupational Health - Occupational Stress Questionnaire    Feeling of Stress: To some extent  Social Connections: Moderately Integrated (08/27/2024)   Social Connection and Isolation Panel    Frequency of Communication with Friends and Family: More than three times a week    Frequency of Social Gatherings with Friends and Family: More than three times a week    Attends Religious Services: More than 4  times per year    Active Member of [employer] or Organizations: Yes    Attends Banker Meetings: More than 4 times per year    Marital Status: Widowed    Tobacco Counseling Counseling given: Not Answered    Clinical Intake:  Pre-visit preparation completed: Yes  Pain : 0-10 Pain Score: 8  Pain Type: Chronic pain Pain Location: Foot Pain Orientation: Left Pain Radiating Towards: toward knee Pain Descriptors / Indicators: Sore, Discomfort Pain Onset: More than a month ago Pain Frequency: Intermittent Pain Relieving Factors: elevates leg dissaptes Effect of Pain on Daily Activities: has to elevate  Pain Relieving Factors: elevates leg dissaptes  BMI - recorded: 30.82 Nutritional Status: BMI > 30  Obese Nutritional Risks: Nausea/ vomitting/ diarrhea (lots of nausea:pt thinks from evtyvio infusions) Diabetes: No  Lab Results  Component Value Date   HGBA1C 5.6 03/21/2023   HGBA1C 5.7 11/29/2022   HGBA1C 5.8 05/29/2022     How often do you need to have someone help you when you read instructions, pamphlets, or other written materials from your doctor or pharmacy?: 1 - Never  Interpreter Needed?: No  Comments: lives alone Information entered by :: B.Robyn Nohr,LPN   Activities of Daily Living     08/27/2024   10:00 AM 06/24/2024    6:50 PM  In your present state of health, do you have any difficulty performing the following activities:  Hearing? 0 0  Vision? 0 0  Difficulty concentrating or making decisions? 0 0  Walking or climbing stairs? 0   Dressing or bathing? 0  Doing errands, shopping? 0 0  Preparing Food and eating ? N   Using the Toilet? N   In the past six months, have you accidently leaked urine? N   Do you have problems with loss of bowel control? N   Managing your Medications? N   Managing your Finances? N   Housekeeping or managing your Housekeeping? N     Patient Care Team: Corwin Antu, FNP as PCP - General (Family  Medicine) Louis Shove, MD as Consulting Physician (Neurosurgery) Penne Knee, MD (Inactive) as Consulting Physician (Urology) Lonni Slain, MD as Consulting Physician (Cardiology) Jacobo Evalene PARAS, MD as Consulting Physician (Hematology and Oncology) Portia Fireman, OD (Optometry)  I have updated your Care Teams any recent Medical Services you may have received from other providers in the past year.     Assessment:   This is a routine wellness examination for Nicole Daniels.  Hearing/Vision screen Hearing Screening - Comments:: Patient denies any hearing difficulties.   Vision Screening - Comments:: Pt says their vision is good without glasses Dr  Verdell w/visits   Goals Addressed             This Visit's Progress    Patient Stated       08/27/24-Eat healthier & increase activity     Patient Stated       08/27/24-Maintain health        Depression Screen     08/27/2024    9:56 AM 08/11/2024   11:39 AM 01/23/2024    1:34 PM 10/11/2023   10:57 AM 08/27/2023    9:02 AM 03/21/2023   10:16 AM 02/13/2023    9:04 AM  PHQ 2/9 Scores  PHQ - 2 Score 0 2 0 0 0 2   PHQ- 9 Score  8  0  5   Exception Documentation       Patient refusal    Fall Risk     08/27/2024    9:54 AM 08/11/2024   11:39 AM 10/11/2023   10:57 AM 08/27/2023    9:00 AM 06/07/2023    2:24 PM  Fall Risk   Falls in the past year? 0 0 0 0 0  Number falls in past yr: 0 0 0 0 0  Injury with Fall? 0 0 0 0 0  Risk for fall due to : No Fall Risks No Fall Risks No Fall Risks No Fall Risks No Fall Risks  Follow up Falls prevention discussed;Education provided Falls evaluation completed Falls evaluation completed Falls prevention discussed Falls evaluation completed    MEDICARE RISK AT HOME:  Medicare Risk at Home Any stairs in or around the home?: Yes If so, are there any without handrails?: Yes Home free of loose throw rugs in walkways, pet beds, electrical cords, etc?: Yes Adequate  lighting in your home to reduce risk of falls?: Yes Life alert?: No Use of a cane, walker or w/c?: No Grab bars in the bathroom?: No Shower chair or bench in shower?: No Elevated toilet seat or a handicapped toilet?: Yes  TIMED UP AND GO:  Was the test performed?  Yes  Length of time to ambulate 10 feet: 10 sec Gait steady and fast without use of assistive device  Cognitive Function: 6CIT completed    02/20/2018    9:43 AM 01/01/2017   11:47 AM  MMSE - Mini Mental State Exam  Orientation to time 5 5   Orientation to Place 5 5   Registration 3 3   Attention/ Calculation  5 0   Recall 3 3   Language- name 2 objects 2 0   Language- repeat 1 1  Language- follow 3 step command 3 3   Language- read & follow direction 1 0   Write a sentence 1 0   Copy design 1 0   Total score 30 20      Data saved with a previous flowsheet row definition        08/27/2024   10:05 AM 08/27/2023    9:04 AM 08/16/2022   11:12 AM  6CIT Screen  What Year? 0 points 0 points 0 points  What month? 0 points 0 points 0 points  What time? 0 points 0 points 0 points  Count back from 20 0 points 0 points 0 points  Months in reverse 0 points 0 points 0 points  Repeat phrase 0 points 0 points 0 points  Total Score 0 points 0 points 0 points    Immunizations Immunization History  Administered Date(s) Administered   Hepb-cpg 05/22/2024   Influenza,inj,Quad PF,6+ Mos 09/01/2015   PFIZER(Purple Top)SARS-COV-2 Vaccination 06/25/2020, 07/16/2020   Pneumococcal Polysaccharide-23 05/29/2022   Td 10/30/1988, 08/25/2008    Screening Tests Health Maintenance  Topic Date Due   Zoster Vaccines- Shingrix (1 of 2) Never done   Pneumococcal Vaccine: 50+ Years (2 of 2 - PCV) 05/30/2023   DEXA SCAN  10/07/2023   COVID-19 Vaccine (3 - 2025-26 season) 06/30/2024   Influenza Vaccine  01/27/2025 (Originally 05/30/2024)   Medicare Annual Wellness (AWV)  08/27/2025   Mammogram  10/21/2025   Colonoscopy  05/07/2026    Hepatitis C Screening  Completed   Meningococcal B Vaccine  Aged Out   DTaP/Tdap/Td  Discontinued   Hepatitis B Vaccines 19-59 Average Risk  Discontinued    Health Maintenance Items Addressed: Pt says she will get Influenza later after body is healed; Covid vaccines declines   Additional Screening:  Vision Screening: Recommended annual ophthalmology exams for early detection of glaucoma and other disorders of the eye. Is the patient up to date with their annual eye exam?  Yes  Who is the provider or what is the name of the office in which the patient attends annual eye exams? Dr Portia  Dental Screening: Recommended annual dental exams for proper oral hygiene  Community Resource Referral / Chronic Care Management: CRR required this visit?  No   CCM required this visit?  No   Plan:    I have personally reviewed and noted the following in the patient's chart:   Medical and social history Use of alcohol, tobacco or illicit drugs  Current medications and supplements including opioid prescriptions. Patient is not currently taking opioid prescriptions. Functional ability and status Nutritional status Physical activity Advanced directives List of other physicians Hospitalizations, surgeries, and ER visits in previous 12 months Vitals Screenings to include cognitive, depression, and falls Referrals and appointments  In addition, I have reviewed and discussed with patient certain preventive protocols, quality metrics, and best practice recommendations. A written personalized care plan for preventive services as well as general preventive health recommendations were provided to patient.   Erminio LITTIE Saris, LPN   [id number]   After Visit Summary: (MyChart) Due to this being a telephonic visit, the after visit summary with patients personalized plan was offered to patient via MyChart   Notes: Nothing significant to report at this time.

## 2024-08-28 ENCOUNTER — Ambulatory Visit (INDEPENDENT_AMBULATORY_CARE_PROVIDER_SITE_OTHER): Admitting: Family Medicine

## 2024-08-28 ENCOUNTER — Telehealth: Payer: Self-pay

## 2024-08-28 ENCOUNTER — Encounter: Payer: Self-pay | Admitting: Family Medicine

## 2024-08-28 VITALS — BP 134/78 | HR 71 | Temp 98.3°F | Ht 65.0 in | Wt 185.2 lb

## 2024-08-28 DIAGNOSIS — N898 Other specified noninflammatory disorders of vagina: Secondary | ICD-10-CM

## 2024-08-28 LAB — POC URINALSYSI DIPSTICK (AUTOMATED)
Bilirubin, UA: NEGATIVE
Glucose, UA: NEGATIVE
Ketones, UA: NEGATIVE
Leukocytes, UA: NEGATIVE
Nitrite, UA: NEGATIVE
Protein, UA: NEGATIVE
Spec Grav, UA: 1.01 (ref 1.010–1.025)
Urobilinogen, UA: 0.2 U/dL
pH, UA: 6 (ref 5.0–8.0)

## 2024-08-28 LAB — POCT UA - MICROSCOPIC ONLY

## 2024-08-28 MED ORDER — FLUCONAZOLE 150 MG PO TABS: 150.0000 mg | ORAL_TABLET | Freq: Once | ORAL | 0 refills | Status: AC

## 2024-08-28 NOTE — Progress Notes (Signed)
 Patient ID: Nicole Daniels, female    DOB: 12-27-1950, 73 y.o.   MRN: [id number]  This visit was conducted in person.  BP 134/78   Pulse 71   Temp 98.3 F (36.8 C) (Oral)   Ht 5' 5 (1.651 m)   Wt 185 lb 4 oz (84 kg)   SpO2 96%   BMI 30.83 kg/m    CC:  Chief Complaint  Patient presents with   Vaginal Itching    C/o vaginal itching due to recent Augmentin  tx- finished about 1 wk ago.    Subjective:   HPI: Nicole Daniels is a 73 y.o. female presenting on 08/28/2024 for Vaginal Itching (C/o vaginal itching due to recent Augmentin  tx- finished about 1 wk ago.)    Sinus infection.. treated with  Augmentin , finished 1 week ago.  Vaginal itching, no discharge. In last week, gradually worsening.  NO dysuria, no urgency, on fluid pill and frequency.  No abdominal pain.  No fever.  No CVA tenderness.   Good water  intake.   She has tried leftover oral cream x few days... did not help. She did get some relief with [employer] Healing lotion.     Relevant past medical, surgical, family and social history reviewed and updated as indicated. Interim medical history since our last visit reviewed. Allergies and medications reviewed and updated. Outpatient Medications Prior to Visit  Medication Sig Dispense Refill   acetaminophen  (TYLENOL ) 325 MG tablet Take 650 mg by mouth every 6 (six) hours as needed.     chlorpheniramine  (ALLERGY RELIEF) 4 MG tablet Take 1 tablet (4 mg total) by mouth 2 (two) times daily as needed for allergies.     Cholecalciferol  (VITAMIN D3) 50 MCG (2000 UT) capsule Take 2,000 Units by mouth every other day.     cyanocobalamin  (VITAMIN B12) 500 MCG tablet Take 500 mcg by mouth daily.     diphenhydrAMINE  (DIPHENHIST) 25 mg capsule Take 25 mg by mouth every 6 (six) hours as needed (Prior to infusion).     ENTYVIO 300 MG injection Inject 300 mg into the vein every 6 (six) weeks.     ezetimibe  (ZETIA ) 10 MG tablet Take 1 tablet (10 mg total) by mouth  daily. 90 tablet 3   fluticasone  (FLONASE ) 50 MCG/ACT nasal spray Place 1 spray into both nostrils as needed for allergies.      furosemide (LASIX) 20 MG tablet Take 1 tablet (20 mg total) by mouth daily. Take one tablet po daily x 3 days, then take daily prn swelling lower legs 20 tablet 0   levothyroxine  (SYNTHROID ) 75 MCG tablet Take 1 tablet (75 mcg total) by mouth daily. 90 tablet 3   potassium chloride  SA (KLOR-CON  M) 20 MEQ tablet Take 1 tablet (20 mEq total) by mouth 2 (two) times daily for 3 days, THEN 1 tablet (20 mEq total) daily for 4 days. 10 tablet 0   amoxicillin -clavulanate (AUGMENTIN ) 875-125 MG tablet Take 1 tablet by mouth 2 (two) times daily. (Patient not taking: Reported on 08/27/2024) 20 tablet 0   No facility-administered medications prior to visit.     Per HPI unless specifically indicated in ROS section below Review of Systems  Constitutional:  Negative for fatigue and fever.  HENT:  Negative for congestion and ear pain.   Eyes:  Negative for pain.  Respiratory:  Negative for cough, chest tightness and shortness of breath.   Cardiovascular:  Negative for chest pain, palpitations and leg swelling.  Gastrointestinal:  Negative for abdominal pain.  Genitourinary:  Negative for dysuria and vaginal bleeding.  Musculoskeletal:  Negative for back pain.  Neurological:  Negative for syncope, light-headedness and headaches.  Psychiatric/Behavioral:  Negative for dysphoric mood.    Objective:  BP 134/78   Pulse 71   Temp 98.3 F (36.8 C) (Oral)   Ht 5' 5 (1.651 m)   Wt 185 lb 4 oz (84 kg)   SpO2 96%   BMI 30.83 kg/m   Wt Readings from Last 3 Encounters:  08/28/24 185 lb 4 oz (84 kg)  08/27/24 185 lb 3.2 oz (84 kg)  08/11/24 182 lb 12.8 oz (82.9 kg)      Physical Exam Constitutional:      General: She is not in acute distress.    Appearance: Normal appearance. She is well-developed. She is not ill-appearing or toxic-appearing.  HENT:     Head: Normocephalic.      Right Ear: Hearing, tympanic membrane, ear canal and external ear normal. Tympanic membrane is not erythematous, retracted or bulging.     Left Ear: Hearing, tympanic membrane, ear canal and external ear normal. Tympanic membrane is not erythematous, retracted or bulging.     Nose: No mucosal edema or rhinorrhea.     Right Sinus: No maxillary sinus tenderness or frontal sinus tenderness.     Left Sinus: No maxillary sinus tenderness or frontal sinus tenderness.     Mouth/Throat:     Pharynx: Uvula midline.  Eyes:     General: Lids are normal. Lids are everted, no foreign bodies appreciated.     Conjunctiva/sclera: Conjunctivae normal.     Pupils: Pupils are equal, round, and reactive to light.  Neck:     Thyroid : No thyroid  mass or thyromegaly.     Vascular: No carotid bruit.     Trachea: Trachea normal.  Cardiovascular:     Rate and Rhythm: Normal rate and regular rhythm.     Pulses: Normal pulses.     Heart sounds: Normal heart sounds, S1 normal and S2 normal. No murmur heard.    No friction rub. No gallop.  Pulmonary:     Effort: Pulmonary effort is normal. No tachypnea or respiratory distress.     Breath sounds: Normal breath sounds. No decreased breath sounds, wheezing, rhonchi or rales.  Abdominal:     General: Bowel sounds are normal.     Palpations: Abdomen is soft.     Tenderness: There is no abdominal tenderness.  Musculoskeletal:     Cervical back: Normal range of motion and neck supple.  Skin:    General: Skin is warm and dry.     Findings: No rash.  Neurological:     Mental Status: She is alert.  Psychiatric:        Mood and Affect: Mood is not anxious or depressed.        Speech: Speech normal.        Behavior: Behavior normal. Behavior is cooperative.        Thought Content: Thought content normal.        Judgment: Judgment normal.       Results for orders placed or performed in visit on 08/28/24  POCT Urinalysis Dipstick (Automated)   Collection Time:  08/28/24  3:50 PM  Result Value Ref Range   Color, UA yellow    Clarity, UA clear    Glucose, UA Negative Negative   Bilirubin, UA negative    Ketones, UA negative    Spec Grav,  UA 1.010 1.010 - 1.025   Blood, UA +/-    pH, UA 6.0 5.0 - 8.0   Protein, UA Negative Negative   Urobilinogen, UA 0.2 0.2 or 1.0 E.U./dL   Nitrite, UA negative    Leukocytes, UA Negative Negative  POCT UA - Microscopic Only   Collection Time: 08/28/24  4:05 PM  Result Value Ref Range   WBC, Ur, HPF, POC 0-2 0 - 5   RBC, Urine, Miroscopic 0-2 0 - 2   Bacteria, U Microscopic     Mucus, UA     Epithelial cells, urine per micros few    Crystals, Ur, HPF, POC     Casts, Ur, LPF, POC none    Yeast, UA      Assessment and Plan  Vaginal itching Assessment & Plan:  Acute, occurred after recent Augmentin  course.  Most likely vaginal Candida.  Patient denies discharge so will not send off wet prep. Treat with fluconazole  150 mg p.o. x 1, may repeat dose 2 days after initial dose if not improving as expected.  Of note urinalysis performed showing trace blood, on microscopic exam no significant red blood cells seen. No sign of urinary tract infection.  Return and ER precautions provided.  Orders: -     POCT Urinalysis Dipstick (Automated) -     POCT UA - Microscopic Only  Other orders -     Fluconazole ; Take 1 tablet (150 mg total) by mouth once for 1 dose. Repeat in 2 days if not improving.  Dispense: 2 tablet; Refill: 0    No follow-ups on file.   Greig Ring, MD

## 2024-08-28 NOTE — Assessment & Plan Note (Addendum)
 Acute, occurred after recent Augmentin  course.  Most likely vaginal Candida.  Patient denies discharge so will not send off wet prep. Treat with fluconazole  150 mg p.o. x 1, may repeat dose 2 days after initial dose if not improving as expected.  Of note urinalysis performed showing trace blood, on microscopic exam no significant red blood cells seen. No sign of urinary tract infection.  Return and ER precautions provided.

## 2024-08-28 NOTE — Telephone Encounter (Signed)
 Secure message has been sent with update to [facility] with [facility].

## 2024-08-28 NOTE — Telephone Encounter (Signed)
 [facility] reviewed patient's last OV & is questioning if patient should still have next infusion on 11/7 as scheduled or if this needs to be delayed until OV with neurology. PCP placed neuro referral, however it doesn't look like patient has been seen. Will clarify with provider & be back in touch with [facility].

## 2024-08-29 ENCOUNTER — Other Ambulatory Visit: Payer: Self-pay | Admitting: Family

## 2024-08-29 DIAGNOSIS — R600 Localized edema: Secondary | ICD-10-CM

## 2024-08-29 DIAGNOSIS — R7989 Other specified abnormal findings of blood chemistry: Secondary | ICD-10-CM

## 2024-09-01 ENCOUNTER — Telehealth: Payer: Self-pay | Admitting: Physician Assistant

## 2024-09-01 MED ORDER — ONDANSETRON HCL 4 MG PO TABS: 4.0000 mg | ORAL_TABLET | Freq: Three times a day (TID) | ORAL | 1 refills | Status: AC | PRN

## 2024-09-01 NOTE — Telephone Encounter (Signed)
 Spoke with Delon with [facility].  Jennifer with [facility] stated that she will reach out to pt.

## 2024-09-01 NOTE — Telephone Encounter (Signed)
 Spoke with patient. Patient reports that she was sick earlier this month. Patient denies fever within the last 5 days. Patient is not sure if she is completely over issue or not. Patient does not feel bad, she reports known swelling in her leg which is being evaluated & treated by her PCP. Patient states that she is supposed to receive Entyvio infusion on 11/7, but no one has confirmed an appt and she has not received the medication like normal. I told patient that we will follow up with [facility] regarding her upcoming appt and medication. Patient verbalized understanding.  Elspeth, RN will contact Delon with [facility].

## 2024-09-01 NOTE — Addendum Note (Signed)
 Addended by: Fannie Gathright N on: 09/01/2024 04:11 PM   Modules accepted: Orders

## 2024-09-01 NOTE — Telephone Encounter (Signed)
 Inbound call from patient stating that she is requesting from some time now to know weather or not she can have her infusion along with her medication. Patient was not specific on what medication. Patient is also looking to have a call back from anyone in regards to letting her know about the infusion. Please advise.

## 2024-09-01 NOTE — Telephone Encounter (Signed)
 Patient can potentially delay the Entyvio infusion by a week due to sinus infection.  Remembered though sometimes the infection can be gone but the inflammation is still there like a tornado that comes through town and it still needs to be cleaned up but the tornado is gone. Suggest doing Nasonex as long drop glaucoma or have blurry vision. Can send in Zofran  4 mg every 8 hours for nausea #31 refill

## 2024-09-02 NOTE — Addendum Note (Signed)
 Addended by: CORWIN ANTU on: 09/02/2024 03:22 PM   Modules accepted: Orders

## 2024-09-05 ENCOUNTER — Ambulatory Visit: Payer: Self-pay

## 2024-09-05 DIAGNOSIS — K5190 Ulcerative colitis, unspecified, without complications: Secondary | ICD-10-CM | POA: Diagnosis not present

## 2024-09-05 NOTE — Telephone Encounter (Signed)
 FYI Only or Action Required?: FYI only for provider: appointment scheduled on 09/11/2024 at 8 AM.  Patient was last seen in primary care on 08/28/2024 by Avelina Greig BRAVO, MD.  Called Nurse Triage reporting Foot Swelling.  Symptoms began 2 months ago.  Interventions attempted: Rest, hydration, or home remedies.  Symptoms are: unchanged.  Triage Disposition: See PCP Within 2 Weeks  Patient/caregiver understands and will follow disposition?: Yes  Copied from CRM [id number]. Topic: Clinical - Red Word Triage >> Sep 05, 2024  2:09 PM Viola F wrote: Patient still having swelling left foot - says provider Ginger Patrick sent her a message to schedule appt Reason for Disposition  Foot pain is a chronic symptom (recurrent or ongoing AND present > 4 weeks)  Answer Assessment - Initial Assessment Questions Patient reports continued foot swelling and pain to left food. Patient states she was told to make another appointment to be seen in office. Scheduled to see PCP on 09/11/2024 at 8 AM  1. ONSET: When did the pain start?      2 months ago 2. LOCATION: Where is the pain located?      Left foot 3. PAIN: How bad is the pain?    (Scale 1-10; or mild, moderate, severe)     Mild-depends on how long patient is on her foot.  4. WORK OR EXERCISE: Has there been any recent work or exercise that involved this part of the body?      no 5. CAUSE: What do you think is causing the foot pain?     unsure 6. OTHER SYMPTOMS: Do you have any other symptoms? (e.g., leg pain, rash, fever, numbness)     Swelling to foot  Protocols used: Foot Pain-A-AH

## 2024-09-08 NOTE — Telephone Encounter (Signed)
 NOTED Will see her as scheduled

## 2024-09-11 ENCOUNTER — Ambulatory Visit: Admitting: Family

## 2024-09-11 ENCOUNTER — Ambulatory Visit: Payer: Self-pay | Admitting: Family

## 2024-09-11 ENCOUNTER — Encounter: Payer: Self-pay | Admitting: Family

## 2024-09-11 VITALS — BP 138/80 | HR 74 | Temp 98.5°F | Ht 65.0 in | Wt 184.2 lb

## 2024-09-11 DIAGNOSIS — I5031 Acute diastolic (congestive) heart failure: Secondary | ICD-10-CM

## 2024-09-11 DIAGNOSIS — E876 Hypokalemia: Secondary | ICD-10-CM

## 2024-09-11 DIAGNOSIS — E039 Hypothyroidism, unspecified: Secondary | ICD-10-CM | POA: Diagnosis not present

## 2024-09-11 DIAGNOSIS — R7989 Other specified abnormal findings of blood chemistry: Secondary | ICD-10-CM

## 2024-09-11 DIAGNOSIS — R600 Localized edema: Secondary | ICD-10-CM | POA: Diagnosis not present

## 2024-09-11 DIAGNOSIS — R0609 Other forms of dyspnea: Secondary | ICD-10-CM | POA: Diagnosis not present

## 2024-09-11 DIAGNOSIS — R3915 Urgency of urination: Secondary | ICD-10-CM

## 2024-09-11 DIAGNOSIS — N898 Other specified noninflammatory disorders of vagina: Secondary | ICD-10-CM

## 2024-09-11 DIAGNOSIS — E538 Deficiency of other specified B group vitamins: Secondary | ICD-10-CM

## 2024-09-11 DIAGNOSIS — R5382 Chronic fatigue, unspecified: Secondary | ICD-10-CM

## 2024-09-11 DIAGNOSIS — I493 Ventricular premature depolarization: Secondary | ICD-10-CM

## 2024-09-11 LAB — COMPREHENSIVE METABOLIC PANEL WITH GFR
ALT: 12 U/L (ref 0–35)
AST: 19 U/L (ref 0–37)
Albumin: 4 g/dL (ref 3.5–5.2)
Alkaline Phosphatase: 77 U/L (ref 39–117)
BUN: 10 mg/dL (ref 6–23)
CO2: 29 meq/L (ref 19–32)
Calcium: 9.8 mg/dL (ref 8.4–10.5)
Chloride: 105 meq/L (ref 96–112)
Creatinine, Ser: 0.94 mg/dL (ref 0.40–1.20)
GFR: 59.77 mL/min — ABNORMAL LOW (ref >60.00)
Glucose, Bld: 94 mg/dL (ref 70–99)
Potassium: 4 meq/L (ref 3.5–5.1)
Sodium: 144 meq/L (ref 135–145)
Total Bilirubin: 0.8 mg/dL (ref 0.2–1.2)
Total Protein: 6.5 g/dL (ref 6.0–8.3)

## 2024-09-11 LAB — CBC
HCT: 41 % (ref 36.0–46.0)
Hemoglobin: 13.6 g/dL (ref 12.0–15.0)
MCHC: 33.3 g/dL (ref 30.0–36.0)
MCV: 89 fl (ref 78.0–100.0)
Platelets: 395 K/uL (ref 150.0–400.0)
RBC: 4.6 Mil/uL (ref 3.87–5.11)
RDW: 12.6 % (ref 11.5–15.5)
WBC: 5.3 K/uL (ref 4.0–10.5)

## 2024-09-11 LAB — TSH: TSH: 2.01 u[IU]/mL (ref 0.35–5.50)

## 2024-09-11 LAB — BRAIN NATRIURETIC PEPTIDE: Pro B Natriuretic peptide (BNP): 140 pg/mL — ABNORMAL HIGH (ref 0.0–100.0)

## 2024-09-11 MED ORDER — DAPAGLIFLOZIN PROPANEDIOL 10 MG PO TABS: 10.0000 mg | ORAL_TABLET | Freq: Every day | ORAL | 3 refills | Status: DC

## 2024-09-11 MED ORDER — DAPAGLIFLOZIN PROPANEDIOL 5 MG PO TABS: 5.0000 mg | ORAL_TABLET | Freq: Every day | ORAL | 0 refills | Status: DC

## 2024-09-11 NOTE — Progress Notes (Signed)
 Established Patient Office Visit  Subjective:      CC:  Chief Complaint  Patient presents with   Acute Visit    Foot pain and swelling    HPI: Nicole Daniels is a 73 y.o. female presenting on 09/11/2024 for Acute Visit (Foot pain and swelling) .  Discussed the use of AI scribe software for clinical note transcription with the patient, who gave verbal consent to proceed.  History of Present Illness Nicole Daniels is a 73 year old female who presents with worsening joint pain and swelling after Entyvio infusions.  She experiences significant joint pain and swelling, particularly in her knees, hands, and ankles. The pain is described as similar to a 'toothache' and worsens with activity. Symptoms began after starting Entyvio infusions in September, with the patient reporting that the first infusion was when symptoms started. Pain radiates up the back of her leg, and swelling can double the size of her legs, especially after infusions.  She has a history of swelling in her left foot and ankle, which began in September and has since progressed to her right foot. The swelling is significant enough to prevent wearing closed shoes. Elevating her foot provides minimal relief, and she uses compression stockings, which help slightly but can be uncomfortable. She takes furosemide sporadically, which offers some relief, but notes no significant difference when not taking it.  She experiences shortness of breath, which comes and goes, particularly after exertion such as showering or climbing stairs. She had an echocardiogram in August 2025 showing an ejection fraction of 55-60%. Occasional palpitations occur, and she has a history of premature supraventricular complexes. She has not seen a cardiologist recently but has a history of a normal calcium  score and no evidence of coronary artery disease.  She mentions a past episode of a yeast infection, which was treated with  Diflucan , and reports occasional itching. No current burning with urination, but she notes a stronger urge to urinate at times. She has been working on increasing her water intake using [employer].  Her family history includes her mother having died of congestive heart failure. She is concerned about her symptoms potentially being related to heart issues, given her family history.         Social history:  Relevant past medical, surgical, family and social history reviewed and updated as indicated. Interim medical history since our last visit reviewed.  Allergies and medications reviewed and updated.  DATA REVIEWED: CHART IN EPIC     ROS: Negative unless specifically indicated above in HPI.    Current Outpatient Medications:    acetaminophen  (TYLENOL ) 325 MG tablet, Take 650 mg by mouth every 6 (six) hours as needed., Disp: , Rfl:    chlorpheniramine  (ALLERGY RELIEF) 4 MG tablet, Take 1 tablet (4 mg total) by mouth 2 (two) times daily as needed for allergies., Disp: , Rfl:    Cholecalciferol  (VITAMIN D3) 50 MCG (2000 UT) capsule, Take 2,000 Units by mouth every other day., Disp: , Rfl:    cyanocobalamin  (VITAMIN B12) 500 MCG tablet, Take 500 mcg by mouth daily., Disp: , Rfl:    diphenhydrAMINE  (DIPHENHIST) 25 mg capsule, Take 25 mg by mouth every 6 (six) hours as needed (Prior to infusion)., Disp: , Rfl:    ENTYVIO 300 MG injection, Inject 300 mg into the vein every 6 (six) weeks., Disp: , Rfl:    ezetimibe  (ZETIA ) 10 MG tablet, Take 1 tablet (10 mg total) by mouth  daily., Disp: 90 tablet, Rfl: 3   fluticasone  (FLONASE ) 50 MCG/ACT nasal spray, Place 1 spray into both nostrils as needed for allergies. , Disp: , Rfl:    furosemide (LASIX) 20 MG tablet, TAKE ONE TABLET DAILY X 3 DAYS, THEN TAKE DAILY AS NEEDED FOR SWELLING LOWER LEGS, Disp: 20 tablet, Rfl: 0   levothyroxine  (SYNTHROID ) 75 MCG tablet, Take 1 tablet (75 mcg total) by mouth daily., Disp: 90 tablet, Rfl: 3    ondansetron  (ZOFRAN ) 4 MG tablet, Take 1 tablet (4 mg total) by mouth every 8 (eight) hours as needed for nausea or vomiting., Disp: 30 tablet, Rfl: 1   potassium chloride  SA (KLOR-CON  M) 20 MEQ tablet, Take 1 tablet (20 mEq total) by mouth 2 (two) times daily for 3 days, THEN 1 tablet (20 mEq total) daily for 4 days., Disp: 10 tablet, Rfl: 0        Objective:        BP 138/80 (BP Location: Left Arm, Patient Position: Sitting, Cuff Size: Large)   Pulse 74   Temp 98.5 F (36.9 C) (Temporal)   Ht 5' 5 (1.651 m)   Wt 184 lb 3.2 oz (83.6 kg)   SpO2 96%   BMI 30.65 kg/m   Physical Exam   Wt Readings from Last 3 Encounters:  09/11/24 184 lb 3.2 oz (83.6 kg)  08/28/24 185 lb 4 oz (84 kg)  08/27/24 185 lb 3.2 oz (84 kg)    Physical Exam Vitals reviewed.  Constitutional:      General: She is not in acute distress.    Appearance: Normal appearance. She is normal weight. She is not ill-appearing, toxic-appearing or diaphoretic.  HENT:     Head: Normocephalic.  Cardiovascular:     Rate and Rhythm: Normal rate and regular rhythm.     Pulses:          Dorsalis pedis pulses are 1+ on the right side and 1+ on the left side.  Pulmonary:     Effort: Pulmonary effort is normal.     Breath sounds: Normal breath sounds.  Musculoskeletal:        General: Normal range of motion.     Right lower leg: Edema (mild) present.     Left lower leg: Edema (mild) present.  Neurological:     General: No focal deficit present.     Mental Status: She is alert and oriented to person, place, and time. Mental status is at baseline.  Psychiatric:        Mood and Affect: Mood normal.        Behavior: Behavior normal.        Thought Content: Thought content normal.        Judgment: Judgment normal.          Results DIAGNOSTIC Echocardiogram: Normal cardiac function with an ejection fraction of 55-60% (06/24/2024)  Assessment & Plan:   Assessment and Plan Assessment & Plan Bilateral  lower extremity swelling and pain Chronic bilateral lower extremity swelling and pain, worsening since September. Swelling affects both feet, with significant pain in knees, hands, and ankles. Pain is exacerbated by activity and worsens after Entyvio infusions. Differential includes lymphedema and vascular issues. Echocardiogram shows good heart function, ruling out heart failure as a primary cause. Furosemide provides minimal relief. No signs of DVT on ultrasound. BNP slightly elevated, possibly due to inflammation. - Continue furosemide as needed for swelling. - Referred to vascular specialist for further evaluation of swelling. - Repeated BNP to  monitor trends. -ER precautions advised - Referred to cardiology for comprehensive evaluation of exertional symptoms.  Shortness of breath and exertional fatigue Intermittent shortness of breath and exertional fatigue, particularly after physical activity. Symptoms may be related to previous prednisone  use or other underlying conditions. No current chest pain or significant cardiac symptoms. Echocardiogram shows good heart function. BNP slightly elevated, possibly due to inflammation. - Referred to cardiology for evaluation of exertional symptoms. - Repeated BNP to monitor trends.  Hypertension with left ventricular hypertrophy and grade 1 diastolic dysfunction Hypertension with left ventricular hypertrophy and grade 1 diastolic dysfunction. Blood pressure management is ongoing. No recent cardiology follow-up since 2021. - Referred to cardiology for evaluation of hypertension and cardiac function.  Ventricular premature depolarizations (PVCs) Occasional PVCs noted on previous EKG, not frequent. Symptoms may be exacerbated by exertion and possibly caffeine intake. No significant cardiac symptoms reported. - Referred to cardiology for evaluation of PVCs and cardiac function.  Hypokalemia Previous hypokalemia managed with potassium supplementation.  Current potassium levels need monitoring due to diuretic use. - Continue potassium supplementation as prescribed.  History of vaginal yeast infection Previous vaginal yeast infection treated with Diflucan . Occasional itching persists, but symptoms have improved. - Provided self-swab kit for yeast infection monitoring. -pt to come back         Return in about 1 month (around 10/11/2024) for f/u pedal edema.     Ginger Patrick, MSN, APRN, FNP-C [facility]

## 2024-09-11 NOTE — Patient Instructions (Signed)
  Trial without zetia  see if this helps stop the joint pains stop for one week If no improvement restart

## 2024-09-12 MED ORDER — DAPAGLIFLOZIN PROPANEDIOL 5 MG PO TABS
5.0000 mg | ORAL_TABLET | Freq: Every day | ORAL | 0 refills | Status: DC
Start: 2024-09-12 — End: 2024-09-22

## 2024-09-12 NOTE — Addendum Note (Signed)
 Addended by: ALBINO SHAVER C on: 09/12/2024 10:45 AM   Modules accepted: Orders

## 2024-09-15 NOTE — Progress Notes (Unsigned)
 09/16/2024 Nicole Daniels [id number] April 24, 1951  Referring provider: Corwin Antu, FNP Primary GI doctor: Dr. Federico ( Dr. Suzann)  ASSESSMENT AND PLAN:  Ulcerative colitis diagnosed April 2025 after flexible sigmoidoscopy with multitude of symptoms, uncertain if related to Entyvio or other, here for follow up Predominant symptoms diarrhea and rectal bleeding which have resolved Failed budesonide , Rowasa  enemas, Lialda  4.8 g Allergic reaction to Tremfya (lip swelling, throat swelling) Entyvio started 07/25/2024,last injection Nov 7th, next injection Jan, was having itching, swelling, vision symptoms but resolved with treatment of sinus infection Negative cortisol, urine Uncertain still if this is from entyvio versus injection/autoimmune/cardio, continuing pruritus, DOE, swelling, joint pain -? Worth getting antibody for entyvio before next injection or waiting until Jan? - check labs as listed below, rule out infection, autoimmune, etc - consider trial off entyvio to see if symptoms persist? - Consider Humira or Remicade? (Not a candidate for rinvoq)  -Will discuss further with Dr. Suzann  Polyarthalgias Bilateral hands, ankles, knees, not daily Some chills intermittently but no fever, some itching - check ESR, CRP, lactate - check complements, immunoglobins - possible autoimmune overlap but no visible swelling in joints, get labs for RA, CCP, antiDNA - consider referral to rhuemotology  Bilateral leg swelling with DOE worse carrying things/history of DVT Started on Eliquis  12/20/2023 completed 6 months Negative hematological workup with hematology 06/24/2024 CTA negative, 07/30/2024 lower extremity ultrasound negative 2021 coronary calcium  score 0 05/2024 Echo 55-60%, grade 1 diastolic, normal RSVP, normal valves 07/30/24 negative DVT Worse left leg where her blood clot was but now some on the right leg Started on farxiga, has had some itching- likely causing  vaginal yeast Not a candidate for Jak inhibitors - follow up vascular - consider sleep study test - follow up cardiology - wear compression stockings  History of hemorrhoids Status post banding  Personal history of tubular adenomatous polyps 05/08/2023 5 polyps, nonbleeding external/internal hemorrhoids  History of diverticulosis 02/11/2024 CT acute uncomplicated diverticulitis treated with Augmentin  No AB pain Will call if any symptoms. Add on fiber supplement, avoid NSAIDS, information given  B12 deficiency 07/08/2024 B12 312 Continue B12  IDA 07/08/2024  HGB 13.4 MCV 92.0 Platelets 425.0 07/08/2024 Iron 93 Ferritin 19.9  Recent Labs    05/08/24 1551 06/07/24 0930 06/13/24 1823 06/16/24 1406 06/24/24 0126 06/24/24 1408 06/25/24 0225 07/08/24 1053 08/04/24 1423 09/11/24 0853  HGB 13.6 13.1 14.0 14.9 14.6 12.9 12.9 13.4 13.1 13.6  Start on iron supplement every other day, recheck next OV - recheck this OV  Recurrent acute sinusitis Sinus tenderness and recurrent infections, possibly exacerbated by seasonal allergies. - Advise primary care follow-up for sinus evaluation.  I have reviewed the clinic note as outlined by Nicole Coombs, PA and agree with the assessment, plan and medical decision making.  Patient of Nicole Daniels with ulcerative colitis returns to the office after transitioning from Nicole Daniels (allergic reaction) to Entyvio.  Has now had ?  3 induction infusions for Entyvio beginning 07/25/2024.  Reporting multiple systemic complaints including asymmetric lower extremity swelling (DVT ruled out), shortness of breath, diffuse joint pain and pruritus.  No active GI symptoms.  I discussed case with APP and the systemic symptoms do not sound consistent with side effects of Entyvio as it is very gut specific.  Agree with multidisciplinary evaluation.  If patient wishes to hold on next dose of Entyvio to see if symptoms wean can do so.  If flare occurs can use prednisone .  If  these were  side effects of Entyvio I would anticipate that it would improve as the drug is metabolized but I think it is less likely.  Nicole Hausen, MD    Patient Care Team: Nicole Antu, FNP as PCP - General (Family Medicine) Louis Shove, MD as Consulting Physician (Neurosurgery) Penne Knee, MD (Inactive) as Consulting Physician (Urology) Lonni Slain, MD as Consulting Physician (Cardiology) Jacobo Evalene PARAS, MD as Consulting Physician (Hematology and Oncology) Portia Fireman, OD (Optometry)  HISTORY OF PRESENT ILLNESS: 73 y.o. female presents for evaluation of ulcerative colitis.  IBD history: Diagnosed April 2025 after flexible sigmoidoscopy with symptoms of diarrhea and rectal bleeding. Budesonide  x 2 months with initial help for 6 weeks reoccurring last 2 weeks of medication Rowasa  enemas April 2025 without help May 2025 oral mesalamine  Lialda  4.8 g 03/06/2024 CT diffuse colonic wall thickening compatible with colitis May 2025 fecal calprotectin 3330 Started on Tremfya with anaphylaxis Repeat prednisone  taper 05/09/2024 fecal calprotectin 4080 Started on Entyvio x 07/25/2024, has had 2 doses, due Nov 7th Treated for sinusitis per primary care Complaining of bilateral leg swelling, knee hand and ankle discomfort, shortness of breath referred to vascular doctor and cardiology.  Last colonoscopy: 05/08/2023 5 polyps, nonbleeding external/internal hemorrhoids 01/29/2024 flexible sigmoidoscopy - Diffuse inflammation was found in the rectum, in the sigmoid colon and in the descending colon. Biopsied.  - Diverticulosis in the sigmoid colon.  - Non- bleeding internal hemorrhoids. Path: showed that she has left-sided inflammation in her colon as a result of ulcerative colitis.  Last small bowel imaging: 06/13/2024 CTAP W no evidence of active UC, mild diverticulosis without evidence of acute diverticulitis unremarkable liver, gallbladder pancreas and  spleen Extraintestinal manifestations: ? artharlgias Surgical history: no surgery.  Other significant medical history: History of DVT not a candidate for Jak inhibitor Vitamin D  deficiency B12 deficiency History of hemorrhoids IDA  Current History Discussed the use of AI scribe software for clinical note transcription with the patient, who gave verbal consent to proceed.  History of Present Illness   Nicole Daniels is a 73 year old female with a history of blood clots and grade one diastolic dysfunction who presents with swelling and joint pain.  She began experiencing swelling in her left foot, ankle, and leg in September, which is the same leg where she previously had a blood clot. The swelling has since become a daily occurrence and now affects her right leg intermittently. The swelling improves with elevation at night but returns after being up for about an hour.  She experiences joint pain in her legs, hands, knees, and ankles, describing the pain as feeling like her ankles are being crushed. The joint pain is intermittent but can be severe enough to make walking difficult. She also reports leg pain shooting upwards, though this is not a daily occurrence.  She has shortness of breath, particularly when climbing stairs or performing activities like vacuuming. This symptom has developed gradually. No fevers or chills, though she occasionally experiences cold spells.  She reports itching all over her body, including her hands, feet, legs, back, and sides, which started after beginning Farxiga. The itching subsides after a while. No current rashes.  Her bowel movements are normal, with no diarrhea, blood, or abnormal stool consistency. She has a history of sinus infections and was recently treated with Augmentin , which led to a yeast infection. She took two doses of an antifungal medication to resolve it.  She experiences occasional dry coughs and has been on a diuretic  medication. She recalls being told there was something on her echocardiogram in August, but does not remember the specific words; she also reports that her primary care doctor mentioned her BNP number was high.       Inflammatory markers    Latest Ref Rng & Units 06/16/2024   14:06 08/04/2024   14:23 08/06/2024   10:52  Inflammatory Markers  Calprotectin mcg/g   126                                             Reference Range:                                       <50     Normal                                       50-120  Borderline                                       >120    Elevated . Calprotectin in Crohn's disease and ulcerative colitis can be five to several thousand times above the reference population (50 mcg/g or less). Levels are usually 50 mcg/g or less in healthy patients and with irritable bowel syndrome. Repeat testing in 4-6 weeks is suggested for borderline values.   Sed Rate 0 - 30 mm/hr  13    CRP 0.5 - 20.0 mg/dL <8.9  <9.4      Recent labs: 05/12/2024 TB GOLD NEGATIVE 05/12/2024  HepBsAG NON-REACTIVE  06/16/2024 CRP <1.0  03/07/2024 SED RATE 23 Fecal cal 05/09/2024 4080 07/08/2024 WBC 8.3 HGB 13.4 MCV 92.0 Platelets 425.0 07/08/2024 Iron 93 Ferritin 19.9  B12 312 FOLATE 21.4 06/16/2024 AST 13; 13 ALT 18; 18 Alkphos 73; 73 TBili 0.6; 0.6 05/08/2024 VITAMIN D  37.47  TB GOLD 05/12/2024 NEGATIVE or TB skin if indeterminate.  HepBsAG 05/12/2024 NON-REACTIVE  TPMT Activity: No results    IBD Health Care Maintenance: Annual Flu Vaccine - suggest getting Pneumococcal Vaccine if receiving immunosuppression: -  UTD TB testing if on anti-TNF, yearly - UTD Vitamin D  screening -  COVID vaccine suggest getting Shingrix suggest getting  Immunization History  Administered Date(s) Administered   Hepb-cpg 05/22/2024   Influenza,inj,Quad PF,6+ Mos 09/01/2015   PFIZER(Purple Top)SARS-COV-2 Vaccination 06/25/2020, 07/16/2020   Pneumococcal Polysaccharide-23 05/29/2022    Td 10/30/1988, 08/25/2008   Micronutrient evaluation:    Latest Ref Rng & Units 12/31/2023   10:44 05/08/2024   15:51 07/08/2024   10:53  Vitamins and Micronutrients  Ferritin 10.0 - 291.0 ng/mL 133.3  26.2  19.9   Iron 42 - 145 ug/dL 55  82  93   TIBC 749.9 - 450.0 mcg/dL 669.5  609.3  568.7   Folate >5.9 ng/mL  21.4      RELEVANT GI HISTORY, LABS, IMAGING:  CBC    Component Value Date/Time   WBC 5.3 09/11/2024 0853   RBC 4.60 09/11/2024 0853   HGB 13.6 09/11/2024 0853   HCT 41.0 09/11/2024 0853   PLT 395.0 09/11/2024 0853   MCV 89.0 09/11/2024 0853   MCH  30.3 06/25/2024 0225   MCHC 33.3 09/11/2024 0853   RDW 12.6 09/11/2024 0853   LYMPHSABS 2.3 08/04/2024 1423   MONOABS 0.4 08/04/2024 1423   EOSABS 0.2 08/04/2024 1423   BASOSABS 0.0 08/04/2024 1423   Recent Labs    05/08/24 1551 06/07/24 0930 06/13/24 1823 06/16/24 1406 06/24/24 0126 06/24/24 1408 06/25/24 0225 07/08/24 1053 08/04/24 1423 09/11/24 0853  HGB 13.6 13.1 14.0 14.9 14.6 12.9 12.9 13.4 13.1 13.6    CMP     Component Value Date/Time   NA 144 09/11/2024 0853   NA 142 07/01/2020 0949   K 4.0 09/11/2024 0853   CL 105 09/11/2024 0853   CO2 29 09/11/2024 0853   GLUCOSE 94 09/11/2024 0853   BUN 10 09/11/2024 0853   BUN 11 07/01/2020 0949   CREATININE 0.94 09/11/2024 0853   CALCIUM  9.8 09/11/2024 0853   PROT 6.5 09/11/2024 0853   ALBUMIN 4.0 09/11/2024 0853   AST 19 09/11/2024 0853   ALT 12 09/11/2024 0853   ALKPHOS 77 09/11/2024 0853   BILITOT 0.8 09/11/2024 0853   GFRNONAA >60 06/24/2024 0126   GFRAA 89 07/01/2020 0949      Latest Ref Rng & Units 09/11/2024    8:53 AM 08/04/2024    2:23 PM 06/16/2024    2:06 PM  Hepatic Function  Total Protein 6.0 - 8.3 g/dL 6.5  6.4  6.5    6.5   Albumin 3.5 - 5.2 g/dL 4.0  3.9  3.9    3.9   AST 0 - 37 U/L 19  20  13    13    ALT 0 - 35 U/L 12  16  18    18    Alk Phosphatase 39 - 117 U/L 77  61  73    73   Total Bilirubin 0.2 - 1.2 mg/dL 0.8  0.4   0.6    0.6   Bilirubin, Direct 0.0 - 0.3 mg/dL  0.0  0.1       Current Medications:   Current Outpatient Medications (Endocrine & Metabolic):    dapagliflozin propanediol (FARXIGA) 5 MG TABS tablet, Take 1 tablet (5 mg total) by mouth daily.   levothyroxine  (SYNTHROID ) 75 MCG tablet, Take 1 tablet (75 mcg total) by mouth daily.  Current Outpatient Medications (Cardiovascular):    ezetimibe  (ZETIA ) 10 MG tablet, Take 1 tablet (10 mg total) by mouth daily.   furosemide (LASIX) 20 MG tablet, TAKE ONE TABLET DAILY X 3 DAYS, THEN TAKE DAILY AS NEEDED FOR SWELLING LOWER LEGS  Current Outpatient Medications (Respiratory):    chlorpheniramine  (ALLERGY RELIEF) 4 MG tablet, Take 1 tablet (4 mg total) by mouth 2 (two) times daily as needed for allergies.   diphenhydrAMINE  (DIPHENHIST) 25 mg capsule, Take 25 mg by mouth every 6 (six) hours as needed (Prior to infusion).   fluticasone  (FLONASE ) 50 MCG/ACT nasal spray, Place 1 spray into both nostrils as needed for allergies.   Current Outpatient Medications (Analgesics):    acetaminophen  (TYLENOL ) 325 MG tablet, Take 650 mg by mouth every 6 (six) hours as needed.  Current Outpatient Medications (Hematological):    cyanocobalamin  (VITAMIN B12) 500 MCG tablet, Take 500 mcg by mouth daily.  Current Outpatient Medications (Other):    Cholecalciferol  (VITAMIN D3) 50 MCG (2000 UT) capsule, Take 2,000 Units by mouth every other day.   ENTYVIO 300 MG injection, Inject 300 mg into the vein every 6 (six) weeks.   ondansetron  (ZOFRAN ) 4 MG tablet, Take 1 tablet (4  mg total) by mouth every 8 (eight) hours as needed for nausea or vomiting.   potassium chloride  SA (KLOR-CON  M) 20 MEQ tablet, Take 1 tablet (20 mEq total) by mouth 2 (two) times daily for 3 days, THEN 1 tablet (20 mEq total) daily for 4 days. (Patient taking differently: Take 1 tablet (20 mEq total) by mouth 2 (two) times daily for 3 days, THEN 1 tablet (20 mEq total) daily for 4 days.)  Medical  History:  Past Medical History:  Diagnosis Date   Abdominal pain, right lower quadrant    Acquired cyst of kidney 01/08/2012   pt denies history of    Acute sinusitis, unspecified    Adenomatous colon polyp    Allergy    Angiomyolipoma    kidney   Anxiety    OCCASIONAL   Cataract    Chest pain    Chest pain, unspecified    Depressive disorder, not elsewhere classified 01/08/2012   not currently    Diverticulosis    Dizziness and giddiness    DVT (deep venous thrombosis) (HCC)    Dysfunction of eustachian tube    H/O Spinal surgery 02/20/2017   Hemorrhoids    History of diverticulitis of colon    History of fusion of cervical spine 04/25/2018   IBS (irritable bowel syndrome)    Liver lesion 2011   Neoplasm of skin of face    malignant   OA (osteoarthritis)    knee, NECK   Obesity, unspecified    Ostium secundum type atrial septal defect    Other and unspecified hyperlipidemia    Other malaise and fatigue    Palpitations    Pulmonary embolism (HCC)    Shortness of breath    Thyroid  disease    Ulcerative colitis (HCC)    Unspecified essential hypertension    pt states she does not have HTN   Unspecified sinusitis (chronic)    Unspecified vitamin D  deficiency    Allergies:  Allergies  Allergen Reactions   Statins Other (See Comments)    Has severe leg muscle aches and cramps   Zocor [Simvastatin] Other (See Comments)    MYALGIAS   Bactrim [Sulfamethoxazole -Trimethoprim] Itching   Zoloft  [Sertraline ] Anxiety     Surgical History:  She  has a past surgical history that includes Partial hysterectomy; Dilation and curettage of uterus; Cholecystectomy; Breast reduction surgery (1990); Tonsillectomy (1972); Left oophorectomy (1976); Abdominal hysterectomy; Flexible sigmoidoscopy (01/08/2012); Knee arthrotomy; Colonoscopy; Hemorrhoid surgery; Laminectomy (N/A, 01/31/2017); Eye surgery (Bilateral, 07/30/2017); Cervical disc surgery; Reduction mammaplasty; Neck surgery  (N/A, 03/2018); and Upper gastrointestinal endoscopy. Family History:  Her family history includes Breast cancer (age of onset: 75 - 74) in her cousin; Cirrhosis in her father; Diabetes in her cousin, mother, sister, and another family member; Heart disease in her brother and father; Hypertension in her mother and sister; Other in her son; Stroke in an other family member.  REVIEW OF SYSTEMS  : All other systems reviewed and negative except where noted in the History of Present Illness.  PHYSICAL EXAM: BP (!) 140/60 (BP Location: Left Arm, Patient Position: Sitting, Cuff Size: Large)   Pulse 72   Ht 5' 2.5 (1.588 m)   Wt 185 lb (83.9 kg)   BMI 33.30 kg/m  Physical Exam   GENERAL APPEARANCE: Well nourished, in no apparent distress. HEENT: No cervical lymphadenopathy, unremarkable thyroid , sclerae anicteric, conjunctiva pink. RESPIRATORY: Respiratory effort normal, breath sounds equal bilaterally without rales, rhonchi, or wheezing. Lungs normal. CARDIO: Regular rate  and rhythm with no murmurs, rubs, or gallops, peripheral pulses intact. Heart sounds normal. ABDOMEN: Soft, non-distended, active bowel sounds in all four quadrants, no tenderness to palpation, no rebound, no mass appreciated. RECTAL: Declines. MUSCULOSKELETAL: Full range of motion, normal gait, without edema. SKIN: Dry, intact without rashes or lesions. No jaundice. No rash observed. NEURO: Alert, oriented, no focal deficits. PSYCH: Cooperative, normal mood and affect.      Nicole JONELLE Coombs, PA-C 9:02 AM

## 2024-09-16 ENCOUNTER — Other Ambulatory Visit (INDEPENDENT_AMBULATORY_CARE_PROVIDER_SITE_OTHER)

## 2024-09-16 ENCOUNTER — Ambulatory Visit: Admitting: Physician Assistant

## 2024-09-16 ENCOUNTER — Encounter: Payer: Self-pay | Admitting: Physician Assistant

## 2024-09-16 VITALS — BP 140/60 | HR 72 | Ht 62.5 in | Wt 185.0 lb

## 2024-09-16 DIAGNOSIS — E538 Deficiency of other specified B group vitamins: Secondary | ICD-10-CM

## 2024-09-16 DIAGNOSIS — E559 Vitamin D deficiency, unspecified: Secondary | ICD-10-CM | POA: Diagnosis not present

## 2024-09-16 DIAGNOSIS — Z86718 Personal history of other venous thrombosis and embolism: Secondary | ICD-10-CM | POA: Diagnosis not present

## 2024-09-16 DIAGNOSIS — I5031 Acute diastolic (congestive) heart failure: Secondary | ICD-10-CM

## 2024-09-16 DIAGNOSIS — K51811 Other ulcerative colitis with rectal bleeding: Secondary | ICD-10-CM

## 2024-09-16 DIAGNOSIS — M2550 Pain in unspecified joint: Secondary | ICD-10-CM

## 2024-09-16 DIAGNOSIS — J329 Chronic sinusitis, unspecified: Secondary | ICD-10-CM

## 2024-09-16 DIAGNOSIS — R0609 Other forms of dyspnea: Secondary | ICD-10-CM

## 2024-09-16 DIAGNOSIS — L299 Pruritus, unspecified: Secondary | ICD-10-CM

## 2024-09-16 LAB — HEPATIC FUNCTION PANEL
ALT: 12 U/L (ref 0–35)
AST: 17 U/L (ref 0–37)
Albumin: 3.9 g/dL (ref 3.5–5.2)
Alkaline Phosphatase: 75 U/L (ref 39–117)
Bilirubin, Direct: 0 mg/dL (ref 0.0–0.3)
Total Bilirubin: 0.6 mg/dL (ref 0.2–1.2)
Total Protein: 6.5 g/dL (ref 6.0–8.3)

## 2024-09-16 LAB — CBC WITH DIFFERENTIAL/PLATELET
Basophils Absolute: 0 K/uL (ref 0.0–0.1)
Basophils Relative: 0.6 % (ref 0.0–3.0)
Eosinophils Absolute: 0.2 K/uL (ref 0.0–0.7)
Eosinophils Relative: 3.1 % (ref 0.0–5.0)
HCT: 39.3 % (ref 36.0–46.0)
Hemoglobin: 13.1 g/dL (ref 12.0–15.0)
Lymphocytes Relative: 27.2 % (ref 12.0–46.0)
Lymphs Abs: 1.5 K/uL (ref 0.7–4.0)
MCHC: 33.4 g/dL (ref 30.0–36.0)
MCV: 88.7 fl (ref 78.0–100.0)
Monocytes Absolute: 0.5 K/uL (ref 0.1–1.0)
Monocytes Relative: 9.8 % (ref 3.0–12.0)
Neutro Abs: 3.3 K/uL (ref 1.4–7.7)
Neutrophils Relative %: 59.3 % (ref 43.0–77.0)
Platelets: 405 K/uL — ABNORMAL HIGH (ref 150.0–400.0)
RBC: 4.44 Mil/uL (ref 3.87–5.11)
RDW: 12.7 % (ref 11.5–15.5)
WBC: 5.5 K/uL (ref 4.0–10.5)

## 2024-09-16 LAB — BASIC METABOLIC PANEL WITH GFR
BUN: 12 mg/dL (ref 6–23)
CO2: 29 meq/L (ref 19–32)
Calcium: 9.3 mg/dL (ref 8.4–10.5)
Chloride: 105 meq/L (ref 96–112)
Creatinine, Ser: 0.92 mg/dL (ref 0.40–1.20)
GFR: 61.32 mL/min
Glucose, Bld: 102 mg/dL — ABNORMAL HIGH (ref 70–99)
Potassium: 3 meq/L — ABNORMAL LOW (ref 3.5–5.1)
Sodium: 141 meq/L (ref 135–145)

## 2024-09-16 LAB — SEDIMENTATION RATE: Sed Rate: 7 mm/h (ref 0–30)

## 2024-09-16 LAB — HIGH SENSITIVITY CRP: CRP, High Sensitivity: 3.15 mg/L (ref 0.000–5.000)

## 2024-09-16 NOTE — Patient Instructions (Addendum)
 Your provider has requested that you go to the basement level for lab work before leaving today. Press B on the elevator. The lab is located at the first door on the left as you exit the elevator.  VISIT SUMMARY:  Today, we discussed your swelling, joint pain, shortness of breath, itching, and other symptoms. We reviewed your current medications and considered potential side effects. We also planned further evaluations and follow-ups to address your concerns.  YOUR PLAN:  ULCERATIVE COLITIS: Your ulcerative colitis is well-controlled with Entyvio, but there are concerns about itching and joint pain as potential side effects. -Continue taking Entyvio. -Discuss with Doctor M about possibly switching medications if the side effects continue.  CHRONIC LOWER EXTREMITY EDEMA AND HISTORY OF LEFT LOWER EXTREMITY DEEP VEIN THROMBOSIS: You have chronic swelling in your legs, especially the left one, with a history of blood clots. This could be due to heart issues, vein problems, or medication side effects. -You are referred to cardiology for a heart function evaluation. -Wear compression stockings. -Elevate your legs when possible.  CHRONIC JOINT PAIN: You have ongoing joint pain in your legs, hands, knees, and ankles, which could be due to various reasons including medication side effects or autoimmune conditions. -We have ordered blood tests to check for inflammation, autoimmune markers, and other potential causes.  SHORTNESS OF BREATH ON EXERTION: You experience shortness of breath when doing activities. This could be related to heart issues, sleep apnea, or medication side effects. -You are referred to cardiology for further evaluation. -Follow up with a sleep study to check for sleep apnea.  GENERALIZED PRURITUS: You have itching all over your body, which might be related to your medication or an infection. -We have ordered blood tests to check for infections and deficiencies.  CHRONIC  SINUSITIS (POSSIBLE RECURRENCE): You might have a recurrence of chronic sinusitis, causing sinus pressure and phlegm. -Follow up with your primary care doctor for a sinus evaluation.  POSSIBLE SLEEP APNEA: You might have sleep apnea, which could be contributing to your symptoms. -Complete the pending sleep study.  VITAMIN D  AND B GROUP VITAMIN DEFICIENCIES: You have deficiencies in Vitamin D  and B group vitamins, which can affect your overall health. -Ensure you are taking any prescribed supplements for these deficiencies.

## 2024-09-17 ENCOUNTER — Other Ambulatory Visit

## 2024-09-17 DIAGNOSIS — J329 Chronic sinusitis, unspecified: Secondary | ICD-10-CM | POA: Diagnosis not present

## 2024-09-17 DIAGNOSIS — K51811 Other ulcerative colitis with rectal bleeding: Secondary | ICD-10-CM

## 2024-09-17 DIAGNOSIS — M2550 Pain in unspecified joint: Secondary | ICD-10-CM

## 2024-09-17 DIAGNOSIS — L299 Pruritus, unspecified: Secondary | ICD-10-CM

## 2024-09-18 ENCOUNTER — Encounter: Payer: Self-pay | Admitting: Family

## 2024-09-18 ENCOUNTER — Ambulatory Visit (INDEPENDENT_AMBULATORY_CARE_PROVIDER_SITE_OTHER): Admitting: Family

## 2024-09-18 ENCOUNTER — Ambulatory Visit: Payer: Self-pay | Admitting: Physician Assistant

## 2024-09-18 VITALS — BP 142/72 | HR 83 | Temp 98.3°F | Ht 65.0 in | Wt 184.4 lb

## 2024-09-18 DIAGNOSIS — E876 Hypokalemia: Secondary | ICD-10-CM

## 2024-09-18 DIAGNOSIS — R809 Proteinuria, unspecified: Secondary | ICD-10-CM | POA: Diagnosis not present

## 2024-09-18 DIAGNOSIS — N898 Other specified noninflammatory disorders of vagina: Secondary | ICD-10-CM | POA: Diagnosis not present

## 2024-09-18 DIAGNOSIS — R8290 Unspecified abnormal findings in urine: Secondary | ICD-10-CM | POA: Diagnosis not present

## 2024-09-18 DIAGNOSIS — M2550 Pain in unspecified joint: Secondary | ICD-10-CM

## 2024-09-18 LAB — URINALYSIS, ROUTINE W REFLEX MICROSCOPIC
Bilirubin Urine: NEGATIVE
Ketones, ur: NEGATIVE
Leukocytes,Ua: NEGATIVE
Nitrite: NEGATIVE
Specific Gravity, Urine: 1.015 (ref 1.000–1.030)
Total Protein, Urine: NEGATIVE
Urine Glucose: 500 — AB
Urobilinogen, UA: 0.2 (ref 0.0–1.0)
pH: 6.5 (ref 5.0–8.0)

## 2024-09-18 LAB — POCT URINALYSIS DIP (CLINITEK)
Bilirubin, UA: NEGATIVE
Glucose, UA: >=1000 mg/dL — AB
Ketones, POC UA: NEGATIVE mg/dL
Leukocytes, UA: NEGATIVE
Nitrite, UA: NEGATIVE
POC PROTEIN,UA: 30 — AB
Spec Grav, UA: 1.015 (ref 1.010–1.025)
Urobilinogen, UA: 0.2 U/dL
pH, UA: 6 (ref 5.0–8.0)

## 2024-09-18 MED ORDER — POTASSIUM CHLORIDE CRYS ER 20 MEQ PO TBCR: EXTENDED_RELEASE_TABLET | ORAL | 0 refills | Status: DC

## 2024-09-18 NOTE — Progress Notes (Signed)
 Established Patient Office Visit  Subjective:      CC:  Chief Complaint  Patient presents with  . Follow-up    1 week follow up  qw  HPI: Nicole Daniels is a 73 y.o. female presenting on 09/18/2024 for Follow-up (1 week follow up) .  Discussed the use of AI scribe software for clinical note transcription with the patient, who gave verbal consent to proceed.  History of Present Illness Nicole Daniels is a 73 year old female with ulcerative colitis who presents with joint pain and swelling.  She experiences joint pain and swelling in every joint, described as a 'throbbing toothache' that radiates to her knees and hands. This pain limits her daily activities, such as walking and shopping, and requires her to sit down frequently. The pain and swelling have persisted despite taking Lasix (furosemide) 20 mg once daily, which causes frequent urination but does not significantly reduce the swelling.  She has a history of ulcerative colitis and is currently on Entyvio infusions, which have helped manage her UC symptoms. Her UC symptoms, such as bleeding, have improved, and she has gained some energy, allowing her to perform activities like cleaning baseboards. However, she experiences occasional itching, particularly after her Entyvio infusions.  She reports episodes of feeling 'weird' and attributes this to low potassium levels, which were noted in recent lab results. She also experiences fatigue and exertional dyspnea, which have not improved over the past week.  She has a history of a blood clot and bleeding related to her UC. She is currently taking Farxiga once daily. She has a mild reduced ejection fraction and experiences shortness of breath on exertion and swelling   No current burning with urination but notes a strong urge to urinate and a persistent odor in her urine. She has a past episode of a yeast infection treated with Diflucan .  Wt Readings from Last 3  Encounters:  09/18/24 184 lb 6.4 oz (83.6 kg)  09/16/24 185 lb (83.9 kg)  09/11/24 184 lb 3.2 oz (83.6 kg)           Social history:  Relevant past medical, surgical, family and social history reviewed and updated as indicated. Interim medical history since our last visit reviewed.  Allergies and medications reviewed and updated.  DATA REVIEWED: CHART IN EPIC     ROS: Negative unless specifically indicated above in HPI.    Current Outpatient Medications:  .  acetaminophen  (TYLENOL ) 325 MG tablet, Take 650 mg by mouth every 6 (six) hours as needed., Disp: , Rfl:  .  chlorpheniramine  (ALLERGY RELIEF) 4 MG tablet, Take 1 tablet (4 mg total) by mouth 2 (two) times daily as needed for allergies., Disp: , Rfl:  .  Cholecalciferol  (VITAMIN D3) 50 MCG (2000 UT) capsule, Take 2,000 Units by mouth every other day., Disp: , Rfl:  .  cyanocobalamin  (VITAMIN B12) 500 MCG tablet, Take 500 mcg by mouth daily., Disp: , Rfl:  .  dapagliflozin propanediol (FARXIGA) 5 MG TABS tablet, Take 1 tablet (5 mg total) by mouth daily., Disp: 90 tablet, Rfl: 0 .  diphenhydrAMINE  (DIPHENHIST) 25 mg capsule, Take 25 mg by mouth every 6 (six) hours as needed (Prior to infusion)., Disp: , Rfl:  .  ENTYVIO 300 MG injection, Inject 300 mg into the vein every 6 (six) weeks., Disp: , Rfl:  .  ezetimibe  (ZETIA ) 10 MG tablet, Take 1 tablet (10 mg total) by mouth daily., Disp: 90 tablet, Rfl: 3 .  fluticasone  (FLONASE ) 50 MCG/ACT nasal spray, Place 1 spray into both nostrils as needed for allergies. , Disp: , Rfl:  .  furosemide (LASIX) 20 MG tablet, TAKE ONE TABLET DAILY X 3 DAYS, THEN TAKE DAILY AS NEEDED FOR SWELLING LOWER LEGS, Disp: 20 tablet, Rfl: 0 .  levothyroxine  (SYNTHROID ) 75 MCG tablet, Take 1 tablet (75 mcg total) by mouth daily., Disp: 90 tablet, Rfl: 3 .  ondansetron  (ZOFRAN ) 4 MG tablet, Take 1 tablet (4 mg total) by mouth every 8 (eight) hours as needed for nausea or vomiting., Disp: 30 tablet, Rfl:  1 .  potassium chloride  SA (KLOR-CON  M) 20 MEQ tablet, Take 1 tablet (20 mEq total) by mouth 2 (two) times daily for 3 days, THEN 1 tablet (20 mEq total) daily for 4 days., Disp: 10 tablet, Rfl: 0        Objective:        BP (!) 142/72 (BP Location: Left Arm, Patient Position: Sitting, Cuff Size: Large)   Pulse 83   Temp 98.3 F (36.8 C) (Temporal)   Ht 5' 5 (1.651 m)   Wt 184 lb 6.4 oz (83.6 kg)   SpO2 97%   BMI 30.69 kg/m   Physical Exam   Wt Readings from Last 3 Encounters:  09/18/24 184 lb 6.4 oz (83.6 kg)  09/16/24 185 lb (83.9 kg)  09/11/24 184 lb 3.2 oz (83.6 kg)    Physical Exam Vitals reviewed.  Constitutional:      General: She is not in acute distress.    Appearance: Normal appearance. She is normal weight. She is not ill-appearing, toxic-appearing or diaphoretic.  HENT:     Head: Normocephalic.  Cardiovascular:     Rate and Rhythm: Normal rate and regular rhythm.  Pulmonary:     Effort: Pulmonary effort is normal.     Breath sounds: Normal breath sounds.  Musculoskeletal:        General: Normal range of motion.     Right lower leg: No edema.     Left lower leg: 1+ Edema present.  Neurological:     General: No focal deficit present.     Mental Status: She is alert and oriented to person, place, and time. Mental status is at baseline.  Psychiatric:        Mood and Affect: Mood normal.        Behavior: Behavior normal.        Thought Content: Thought content normal.        Judgment: Judgment normal.          Results LABS Potassium: low (09/18/2024) CBC: slightly increased platelets (09/18/2024) Sedimentation rate: negative (09/18/2024) C-reactive protein: negative (09/18/2024) Anti-DNA antibody: negative (09/18/2024) Cyclic citrullinated peptide: negative (09/18/2024) B-type natriuretic peptide: slightly elevated (09/18/2024)  DIAGNOSTIC Echocardiogram: grade one diastolic dysfunction, mild reduced ejection fraction  Assessment &  Plan:   Assessment and Plan Assessment & Plan Bilateral lower extremity swelling and pain with shortness of breath and exertional fatigue Persistent bilateral lower extremity swelling and pain with associated shortness of breath and exertional fatigue. Symptoms may be related to congestive heart failure progression, given grade 1 diastolic dysfunction and elevated BNP. Furosemide provides mild improvement in swelling. Doreen initiated to support heart function. - Continue furosemide 20 mg daily until cardiology follow-up. - Continue Farxiga once daily. - Follow up with cardiology on November 24th. - Follow up with vascular specialist on December 4th.  Hypertension with left ventricular hypertrophy and grade 1 diastolic dysfunction Grade 1 diastolic dysfunction  with mild reduced ejection fraction. Symptoms of shortness of breath and elevated BNP suggest possible congestive heart failure progression. Doreen initiated to support heart function. - Continue Farxiga once daily. - Follow up with cardiology on November 24th.  Ulcerative colitis on Entyvio therapy Ulcerative colitis managed with Entyvio, which has been effective. Concerns about potential progression to other autoimmune diseases or side effects from Entyvio causing polyarthralgia. Discussion about the possibility of stopping Entyvio to assess if joint pain persists, but risks of UC flare-up if Entyvio is discontinued. - Continue Entyvio therapy. - Will consider rheumatology referral if joint pain persists after stopping Entyvio.  Polyarthralgia Generalized joint pain affecting daily activities. Possible side effect of Entyvio or other autoimmune process. Discussion about stopping Entyvio to assess if joint pain persists, but risks of UC flare-up if Entyvio is discontinued. - Will consider stopping Entyvio to assess if joint pain persists. - Will consider rheumatology referral if joint pain persists after stopping  Entyvio.  Hypokalemia Low potassium levels, possibly secondary to furosemide use. Potassium supplementation advised. - Continue potassium chloride  supplementation as advised. - Will repeat potassium and magnesium  levels in 10 days.  Generalized pruritus Intermittent generalized itching, possibly related to Entyvio infusions. No changes in detergents or other potential allergens. - Consider stopping ezetimibe  for 4-5 days to assess if joint pain improves.  Unspecified abnormal findings in urine Occasional itching and past yeast infection treated with Diflucan . Strong urge to urinate still at times. Urine smell noted.          Return in about 3 months (around 12/19/2024).     Ginger Patrick, MSN, APRN, FNP-C [facility]

## 2024-09-18 NOTE — Patient Instructions (Addendum)
  Try stopping the zetia  for four days and see if the joint pains improve   Continue with furosemide  (water pill) for the next one week until you see the cardiologist and then follow their guidance. Schedule a ten day lab only follow up to repeat the potassium and check magnesium . If you stop the furosemide  you should no longer need the potassium.   Follow up with vascular and consider rheumatology per GI.   Continue f/u with gastroenterology

## 2024-09-18 NOTE — Addendum Note (Signed)
 Addended by: ALBINO SHAVER C on: 09/18/2024 12:42 PM   Modules accepted: Orders

## 2024-09-19 ENCOUNTER — Ambulatory Visit: Payer: Self-pay | Admitting: Family

## 2024-09-19 DIAGNOSIS — N390 Urinary tract infection, site not specified: Secondary | ICD-10-CM

## 2024-09-19 LAB — CYCLIC CITRUL PEPTIDE ANTIBODY, IGG: Cyclic Citrullin Peptide Ab: <16 U

## 2024-09-19 LAB — WET PREP BY MOLECULAR PROBE
Candida species: NOT DETECTED
Gardnerella vaginalis: NOT DETECTED
MICRO NUMBER:: 17262613
SPECIMEN QUALITY:: ADEQUATE
Trichomonas vaginosis: NOT DETECTED

## 2024-09-19 LAB — IMMUNOGLOBULINS A/E/G/M, SERUM
IgE (Immunoglobulin E), Serum: 9 [IU]/mL (ref 6–495)
IgG (Immunoglobin G), Serum: 1028 mg/dL (ref 586–1602)
IgM (Immunoglobulin M), Srm: 35 mg/dL (ref 26–217)
Immunoglobulin A, (IgA) QN, Serum: 120 mg/dL (ref 64–422)

## 2024-09-19 LAB — COMPLEMENT, TOTAL: Compl, Total (CH50): >60 U/mL — ABNORMAL HIGH (ref 31–60)

## 2024-09-19 LAB — LACTIC ACID, PLASMA: LACTIC ACID: 1.7 mmol/L (ref 0.4–1.8)

## 2024-09-19 LAB — ANTI-DNA ANTIBODY, DOUBLE-STRANDED: ds DNA Ab: <1 [IU]/mL

## 2024-09-19 LAB — RHEUMATOID FACTOR: Rheumatoid fact SerPl-aCnc: <10 [IU]/mL (ref <14)

## 2024-09-20 LAB — URINE CULTURE
MICRO NUMBER:: 17262602
SPECIMEN QUALITY:: ADEQUATE

## 2024-09-22 ENCOUNTER — Other Ambulatory Visit: Payer: Self-pay | Admitting: Family

## 2024-09-22 ENCOUNTER — Ambulatory Visit: Attending: Cardiology | Admitting: Cardiology

## 2024-09-22 ENCOUNTER — Encounter: Payer: Self-pay | Admitting: Cardiology

## 2024-09-22 VITALS — BP 140/62 | HR 57 | Ht 64.0 in | Wt 184.4 lb

## 2024-09-22 DIAGNOSIS — R600 Localized edema: Secondary | ICD-10-CM | POA: Diagnosis not present

## 2024-09-22 DIAGNOSIS — R7989 Other specified abnormal findings of blood chemistry: Secondary | ICD-10-CM

## 2024-09-22 DIAGNOSIS — E782 Mixed hyperlipidemia: Secondary | ICD-10-CM

## 2024-09-22 MED ORDER — FUROSEMIDE 40 MG PO TABS: 40.0000 mg | ORAL_TABLET | Freq: Every day | ORAL | 3 refills | Status: DC

## 2024-09-22 MED ORDER — POTASSIUM CHLORIDE CRYS ER 20 MEQ PO TBCR: 20.0000 meq | EXTENDED_RELEASE_TABLET | Freq: Every day | ORAL | 3 refills | Status: DC

## 2024-09-22 MED ORDER — NITROFURANTOIN MONOHYD MACRO 100 MG PO CAPS: 100.0000 mg | ORAL_CAPSULE | Freq: Two times a day (BID) | ORAL | 0 refills | Status: AC

## 2024-09-22 NOTE — Progress Notes (Signed)
 Cardiology Office Note:    Date:  09/22/2024   ID:  Nicole Daniels, DOB 11-Sep-1951, MRN [id number]  PCP:  Corwin Antu, FNP   [facility] Providers Cardiologist:  None     Referring MD: Corwin Antu, FNP   Chief Complaint  Patient presents with   New Patient (Initial Visit)    New pt has been doing well with no complaints of chest pain, chest pressure or SOB, some heart racing feeling this morning, has had some leg swelling  on both legs , medication reviewed verbally with patient    History of Present Illness:    Nicole Daniels is a 73 y.o. female with a hx of hyperlipidemia, ulcerative colitis, presenting with leg edema.  She has a history of ulcerative colitis, started on Entyvio about 2 months ago and has noticed leg swelling since then.  Denies any prior edema.  She denies chest pain or shortness of breath.  On Zetia  for cholesterol control, history of statin intolerance.  Prescribed Lasix  20 mg daily, has noticed minimal effect.  BNP obtained about 10 days ago was slightly elevated.  Farxiga  started by PCP.  Echocardiogram 05/2024 EF 55 to 60%, impaired relaxation.  Past Medical History:  Diagnosis Date   Abdominal pain, right lower quadrant    Acquired cyst of kidney 01/08/2012   pt denies history of    Acute sinusitis, unspecified    Adenomatous colon polyp    Allergy    Angiomyolipoma    kidney   Anxiety    OCCASIONAL   Cataract    Chest pain    Chest pain, unspecified    Depressive disorder, not elsewhere classified 01/08/2012   not currently    Diverticulosis    Dizziness and giddiness    DVT (deep venous thrombosis) (HCC)    Dysfunction of eustachian tube    H/O Spinal surgery 02/20/2017   Hemorrhoids    History of diverticulitis of colon    History of fusion of cervical spine 04/25/2018   IBS (irritable bowel syndrome)    Liver lesion 2011   Neoplasm of skin of face    malignant   OA (osteoarthritis)    knee, NECK    Obesity, unspecified    Ostium secundum type atrial septal defect    Other and unspecified hyperlipidemia    Other malaise and fatigue    Palpitations    Pulmonary embolism (HCC)    Shortness of breath    Thyroid  disease    Ulcerative colitis (HCC)    Unspecified essential hypertension    pt states she does not have HTN   Unspecified sinusitis (chronic)    Unspecified vitamin D  deficiency     Past Surgical History:  Procedure Laterality Date   ABDOMINAL HYSTERECTOMY     BREAST REDUCTION SURGERY  1990   CERVICAL DISC SURGERY     Dr.Nudleman   CHOLECYSTECTOMY     COLONOSCOPY     DILATION AND CURETTAGE OF UTERUS     EYE SURGERY Bilateral 07/30/2017   cataract sx   FLEXIBLE SIGMOIDOSCOPY  01/08/2012   Procedure: FLEXIBLE SIGMOIDOSCOPY;  Surgeon: Princella CHRISTELLA Nida, MD;  Location: [facility];  Service: Endoscopy;  Laterality: N/A;   HEMORRHOID SURGERY     KNEE ARTHROTOMY     pt states no    LAMINECTOMY N/A 01/31/2017   Procedure: Thoracic Laminectomy and marsupialization of arachnoid cyst;  Surgeon: Lamar Peaches, MD;  Location: [facility] OR;  Service: Neurosurgery;  Laterality: N/A;  LEFT OOPHORECTOMY  1976   NECK SURGERY N/A 03/2018   PARTIAL HYSTERECTOMY     right ovary remains   REDUCTION MAMMAPLASTY     TONSILLECTOMY  1972   UPPER GASTROINTESTINAL ENDOSCOPY      Current Medications: Current Meds  Medication Sig   acetaminophen  (TYLENOL ) 325 MG tablet Take 650 mg by mouth every 6 (six) hours as needed.   chlorpheniramine  (ALLERGY RELIEF) 4 MG tablet Take 1 tablet (4 mg total) by mouth 2 (two) times daily as needed for allergies.   Cholecalciferol  (VITAMIN D3) 50 MCG (2000 UT) capsule Take 2,000 Units by mouth every other day.   cyanocobalamin  (VITAMIN B12) 500 MCG tablet Take 500 mcg by mouth daily.   diphenhydrAMINE  (DIPHENHIST) 25 mg capsule Take 25 mg by mouth every 6 (six) hours as needed (Prior to infusion).   ENTYVIO 300 MG injection Inject 300 mg into the vein every 6  (six) weeks.   ezetimibe  (ZETIA ) 10 MG tablet Take 1 tablet (10 mg total) by mouth daily.   fluticasone  (FLONASE ) 50 MCG/ACT nasal spray Place 1 spray into both nostrils as needed for allergies.    levothyroxine  (SYNTHROID ) 75 MCG tablet Take 1 tablet (75 mcg total) by mouth daily.   nitrofurantoin , macrocrystal-monohydrate, (MACROBID ) 100 MG capsule Take 1 capsule (100 mg total) by mouth 2 (two) times daily for 5 days.   ondansetron  (ZOFRAN ) 4 MG tablet Take 1 tablet (4 mg total) by mouth every 8 (eight) hours as needed for nausea or vomiting.   [DISCONTINUED] dapagliflozin  propanediol (FARXIGA ) 5 MG TABS tablet Take 1 tablet (5 mg total) by mouth daily.   [DISCONTINUED] furosemide  (LASIX ) 20 MG tablet TAKE ONE TABLET DAILY X 3 DAYS, THEN TAKE DAILY AS NEEDED FOR SWELLING LOWER LEGS   [DISCONTINUED] potassium chloride  SA (KLOR-CON  M) 20 MEQ tablet Take 1 tablet (20 mEq total) by mouth 2 (two) times daily for 3 days, THEN 1 tablet (20 mEq total) daily for 4 days.     Allergies:   Statins, Zocor [simvastatin], Bactrim [sulfamethoxazole -trimethoprim], and Zoloft  [sertraline ]   Social History   Socioeconomic History   Marital status: Widowed    Spouse name: Not on file   Number of children: 2   Years of education: Not on file   Highest education level: 12th grade  Occupational History   Occupation: retired  Tobacco Use   Smoking status: Never   Smokeless tobacco: Never  Vaping Use   Vaping status: Never Used  Substance and Sexual Activity   Alcohol use: No   Drug use: No   Sexual activity: Yes    Partners: Male  Other Topics Concern   Not on file  Social History Narrative   Daily caffeine    Social Drivers of Health   Financial Resource Strain: Low Risk  (08/27/2024)   Overall Financial Resource Strain (CARDIA)    Difficulty of Paying Living Expenses: Not hard at all  Food Insecurity: No Food Insecurity (08/27/2024)   Hunger Vital Sign    Worried About Running Out of Food in  the Last Year: Never true    Ran Out of Food in the Last Year: Never true  Transportation Needs: No Transportation Needs (08/27/2024)   PRAPARE - Administrator, Civil Service (Medical): No    Lack of Transportation (Non-Medical): No  Physical Activity: Insufficiently Active (08/27/2024)   Exercise Vital Sign    Days of Exercise per Week: 4 days    Minutes of Exercise per Session:  20 min  Stress: No Stress Concern Present (08/27/2024)   [employer] of Occupational Health - Occupational Stress Questionnaire    Feeling of Stress: Not at all  Recent Concern: Stress - Stress Concern Present (08/10/2024)   [employer] of Occupational Health - Occupational Stress Questionnaire    Feeling of Stress: To some extent  Social Connections: Moderately Integrated (08/27/2024)   Social Connection and Isolation Panel    Frequency of Communication with Friends and Family: More than three times a week    Frequency of Social Gatherings with Friends and Family: More than three times a week    Attends Religious Services: More than 4 times per year    Active Member of [employer] or Organizations: Yes    Attends Banker Meetings: More than 4 times per year    Marital Status: Widowed     Family History: The patient's family history includes Breast cancer (age of onset: 71 - 48) in her cousin; Cirrhosis in her father; Diabetes in her cousin, mother, sister, and another family member; Heart disease in her brother, father, and sister; Heart failure in her mother; Hypertension in her brother, mother, and sister; Other in her son; Stroke in an other family member. There is no history of Colon cancer, Esophageal cancer, Stomach cancer, Rectal cancer, or Colon polyps.  ROS:   Please see the history of present illness.     All other systems reviewed and are negative.  EKGs/Labs/Other Studies Reviewed:    The following studies were reviewed today:       Recent Labs: 12/24/2023: B  Natriuretic Peptide 66.8 08/11/2024: Magnesium  1.9 09/11/2024: Pro B Natriuretic peptide (BNP) 140.0; TSH 2.01 09/16/2024: ALT 12; BUN 12; Creatinine, Ser 0.92; Hemoglobin 13.1; Platelets 405.0; Potassium 3.0; Sodium 141  Recent Lipid Panel    Component Value Date/Time   CHOL 196 10/11/2023 1134   TRIG 256.0 (H) 10/11/2023 1134   HDL 46.50 10/11/2023 1134   CHOLHDL 4 10/11/2023 1134   VLDL 51.2 (H) 10/11/2023 1134   LDLCALC 98 10/11/2023 1134   LDLDIRECT 174.0 04/07/2021 0839     Risk Assessment/Calculations:            Physical Exam:    VS:  BP (!) 140/62 (BP Location: Left Arm, Patient Position: Sitting)   Pulse (!) 57   Ht 5' 4 (1.626 m)   Wt 184 lb 6.4 oz (83.6 kg)   SpO2 97%   BMI 31.65 kg/m     Wt Readings from Last 3 Encounters:  09/22/24 184 lb 6.4 oz (83.6 kg)  09/18/24 184 lb 6.4 oz (83.6 kg)  09/16/24 185 lb (83.9 kg)     GEN:  Well nourished, well developed in no acute distress HEENT: Normal NECK: No JVD; No carotid bruits CARDIAC: RRR, no murmurs, rubs, gallops RESPIRATORY:  Clear to auscultation without rales, wheezing or rhonchi  ABDOMEN: Soft, non-tender, abdominal dressing/gtube noted MUSCULOSKELETAL:  2+ edema; No deformity  SKIN: Warm and dry NEUROLOGIC:  Alert and oriented x 3 PSYCHIATRIC:  Normal affect   ASSESSMENT:    1. Bilateral leg edema   2. Mixed hyperlipidemia   3. Elevated brain natriuretic peptide (BNP) level    PLAN:    In order of problems listed above:  Leg edema, denies chest pain or shortness of breath.  Compression stockings advised, increase Lasix  to 40 mg daily.  Continue KCl 20 mEq daily.  Check BMP in 2 weeks.  Entyvio is not a common cause for leg  edema although patient mentions having edema after starting Entyvio infusions.  Echo with normal EF, impaired relaxation. Hyperlipidemia, intolerant to statins.  Continue Zetia  10 mg daily. Elevated BNP, nonspecific, does not appear to be in CHF.  Stop Farxiga .  Lasix  as  above.  Follow-up in 6 weeks     Medication Adjustments/Labs and Tests Ordered: Current medicines are reviewed at length with the patient today.  Concerns regarding medicines are outlined above.  Orders Placed This Encounter  Procedures   Basic Metabolic Panel (BMET)   EKG 12-Lead   Meds ordered this encounter  Medications   potassium chloride  SA (KLOR-CON  M) 20 MEQ tablet    Sig: Take 1 tablet (20 mEq total) by mouth daily.    Dispense:  30 tablet    Refill:  3   furosemide  (LASIX ) 40 MG tablet    Sig: Take 1 tablet (40 mg total) by mouth daily.    Dispense:  30 tablet    Refill:  3    DX Code Needed  .    Patient Instructions  Medication Instructions:  - INCREASE lasix  to 40 mg daily - STOP farxiga   *If you need a refill on your cardiac medications before your next appointment, please call your pharmacy*  Lab Work: Your provider would like for you to return in 2 weeks to have the following labs drawn: BMP.   Please go to [facility] [address] ([facility] [address], [place] [address] You do not need an appointment.  They are open from 8 am- 4:30 pm.  Lunch from 1:00 pm- 2:00 pm You do not need to be fasting.  If you have labs (blood work) drawn today and your tests are completely normal, you will receive your results only by: MyChart Message (if you have MyChart) OR A paper copy in the mail If you have any lab test that is abnormal or we need to change your treatment, we will call you to review the results.  Testing/Procedures: No test ordered today   Follow-Up: At [facility], you and your health needs are our priority.  As part of our continuing mission to provide you with exceptional heart care, our providers are all part of one team.  This team includes your primary Cardiologist (physician) and Advanced Practice Providers or APPs (Physician Assistants and Nurse Practitioners) who all work together to provide you with the care  you need, when you need it.  Your next appointment:   6 week(s)  Provider:   You may see Dr Darliss or one of the following Advanced Practice Providers on your designated Care Team:   Lonni Meager, NP Lesley Maffucci, PA-C Bernardino Bring, PA-C Cadence Dexter, PA-C Tylene Lunch, NP Barnie Hila, NP    We recommend signing up for the patient portal called MyChart.  Sign up information is provided on this After Visit Summary.  MyChart is used to connect with patients for Virtual Visits (Telemedicine).  Patients are able to view lab/test results, encounter notes, upcoming appointments, etc.  Non-urgent messages can be sent to your provider as well.   To learn more about what you can do with MyChart, go to [email or web address].             Signed, Redell Darliss, MD  09/22/2024 12:54 PM     [facility]

## 2024-09-22 NOTE — Patient Instructions (Signed)
 Medication Instructions:  - INCREASE lasix  to 40 mg daily - STOP farxiga   *If you need a refill on your cardiac medications before your next appointment, please call your pharmacy*  Lab Work: Your provider would like for you to return in 2 weeks to have the following labs drawn: BMP.   Please go to [facility] [address] ([facility] [address], [place] [address] You do not need an appointment.  They are open from 8 am- 4:30 pm.  Lunch from 1:00 pm- 2:00 pm You do not need to be fasting.  If you have labs (blood work) drawn today and your tests are completely normal, you will receive your results only by: MyChart Message (if you have MyChart) OR A paper copy in the mail If you have any lab test that is abnormal or we need to change your treatment, we will call you to review the results.  Testing/Procedures: No test ordered today   Follow-Up: At [facility], you and your health needs are our priority.  As part of our continuing mission to provide you with exceptional heart care, our providers are all part of one team.  This team includes your primary Cardiologist (physician) and Advanced Practice Providers or APPs (Physician Assistants and Nurse Practitioners) who all work together to provide you with the care you need, when you need it.  Your next appointment:   6 week(s)  Provider:   You may see Dr Darliss or one of the following Advanced Practice Providers on your designated Care Team:   Lonni Meager, NP Lesley Maffucci, PA-C Bernardino Bring, PA-C Cadence [place], PA-C Tylene Lunch, NP Barnie Hila, NP    We recommend signing up for the patient portal called MyChart.  Sign up information is provided on this After Visit Summary.  MyChart is used to connect with patients for Virtual Visits (Telemedicine).  Patients are able to view lab/test results, encounter notes, upcoming appointments, etc.  Non-urgent messages can be sent to your  provider as well.   To learn more about what you can do with MyChart, go to [email or web address].

## 2024-09-24 ENCOUNTER — Other Ambulatory Visit (INDEPENDENT_AMBULATORY_CARE_PROVIDER_SITE_OTHER): Payer: Self-pay | Admitting: Nurse Practitioner

## 2024-09-24 DIAGNOSIS — I8312 Varicose veins of left lower extremity with inflammation: Secondary | ICD-10-CM

## 2024-09-24 LAB — CALPROTECTIN: Calprotectin: 121 ug/g — ABNORMAL HIGH

## 2024-09-29 ENCOUNTER — Other Ambulatory Visit

## 2024-09-29 ENCOUNTER — Telehealth: Payer: Self-pay | Admitting: Physician Assistant

## 2024-09-29 DIAGNOSIS — E876 Hypokalemia: Secondary | ICD-10-CM | POA: Diagnosis not present

## 2024-09-29 LAB — BASIC METABOLIC PANEL WITH GFR
BUN: 15 mg/dL (ref 6–23)
CO2: 28 meq/L (ref 19–32)
Calcium: 9.5 mg/dL (ref 8.4–10.5)
Chloride: 103 meq/L (ref 96–112)
Creatinine, Ser: 1.02 mg/dL (ref 0.40–1.20)
GFR: 54.17 mL/min — ABNORMAL LOW (ref >60.00)
Glucose, Bld: 162 mg/dL — ABNORMAL HIGH (ref 70–99)
Potassium: 3.1 meq/L — ABNORMAL LOW (ref 3.5–5.1)
Sodium: 142 meq/L (ref 135–145)

## 2024-09-29 LAB — MAGNESIUM: Magnesium: 1.9 mg/dL (ref 1.5–2.5)

## 2024-09-29 NOTE — Telephone Encounter (Signed)
 Inbound call from patient requesting to have the nurse call her back due to her needing to have a head shot and they will not schedule her for one until we can clear her for that due to her being on Entyvio. Please advise.

## 2024-09-29 NOTE — Telephone Encounter (Signed)
 Called and spoke with patient. Patient states that her neurologist has recommended a nerve block injection in the back of her head/neck. Patient's neurologist has requested clearance prior to proceeding due to patient taking Entyvio. Patient states that her last Entyvio infusion was around 09/05/24 and should be due for first maintenance infusion on 10/31/24. Please advise, thanks.

## 2024-09-29 NOTE — Telephone Encounter (Unsigned)
 Spoke to patient & informed of Amanda's note below. Will fax to Lauraine Gore, PA-C at [facility] at [phone].    Craig Alan SAUNDERS, PA-C to Mer[facility] Cristino [facility]IL[facility], [place] [facility]    09/29/24  1:17 PM Patient's on Entyvio for UC which has been well-controlled.  Neurologist is requesting clearance for nerve block/injection.  Will Entyvio has some immunosuppression this is rather mild.  Patient should be able to proceed with the scheduled injection/nerve block. Alan

## 2024-10-02 ENCOUNTER — Encounter (INDEPENDENT_AMBULATORY_CARE_PROVIDER_SITE_OTHER): Admitting: Nurse Practitioner

## 2024-10-02 ENCOUNTER — Encounter (INDEPENDENT_AMBULATORY_CARE_PROVIDER_SITE_OTHER)

## 2024-10-02 ENCOUNTER — Telehealth: Payer: Self-pay

## 2024-10-02 DIAGNOSIS — M5481 Occipital neuralgia: Secondary | ICD-10-CM | POA: Diagnosis not present

## 2024-10-02 NOTE — Telephone Encounter (Signed)
 Rheumatology appointment scheduled for 11/20/2024 with Waddell Craze, PA-C

## 2024-10-09 ENCOUNTER — Other Ambulatory Visit: Payer: Self-pay | Admitting: Family

## 2024-10-09 DIAGNOSIS — E782 Mixed hyperlipidemia: Secondary | ICD-10-CM

## 2024-10-09 DIAGNOSIS — M791 Myalgia, unspecified site: Secondary | ICD-10-CM

## 2024-10-09 DIAGNOSIS — E039 Hypothyroidism, unspecified: Secondary | ICD-10-CM

## 2024-10-10 ENCOUNTER — Other Ambulatory Visit: Payer: Self-pay | Admitting: Emergency Medicine

## 2024-10-10 DIAGNOSIS — R600 Localized edema: Secondary | ICD-10-CM

## 2024-10-11 LAB — BASIC METABOLIC PANEL WITH GFR
BUN/Creatinine Ratio: 19 (ref 12–28)
BUN: 18 mg/dL (ref 8–27)
CO2: 25 mmol/L (ref 20–29)
Calcium: 10.1 mg/dL (ref 8.7–10.3)
Chloride: 102 mmol/L (ref 96–106)
Creatinine, Ser: 0.93 mg/dL (ref 0.57–1.00)
Glucose: 75 mg/dL (ref 70–99)
Potassium: 4.1 mmol/L (ref 3.5–5.2)
Sodium: 141 mmol/L (ref 134–144)
eGFR: 64 mL/min/1.73 (ref >59)

## 2024-10-16 NOTE — Addendum Note (Signed)
 Addended by: CORWIN ANTU on: 10/16/2024 03:07 PM   Modules accepted: Orders

## 2024-10-20 ENCOUNTER — Ambulatory Visit (INDEPENDENT_AMBULATORY_CARE_PROVIDER_SITE_OTHER): Admitting: Vascular Surgery

## 2024-10-20 ENCOUNTER — Other Ambulatory Visit (INDEPENDENT_AMBULATORY_CARE_PROVIDER_SITE_OTHER)

## 2024-10-20 ENCOUNTER — Ambulatory Visit: Payer: Self-pay | Admitting: Cardiology

## 2024-10-20 ENCOUNTER — Encounter (INDEPENDENT_AMBULATORY_CARE_PROVIDER_SITE_OTHER): Payer: Self-pay | Admitting: Vascular Surgery

## 2024-10-20 VITALS — BP 149/83 | HR 63 | Resp 17 | Ht 64.0 in | Wt 183.8 lb

## 2024-10-20 DIAGNOSIS — M79669 Pain in unspecified lower leg: Secondary | ICD-10-CM | POA: Diagnosis not present

## 2024-10-20 DIAGNOSIS — E782 Mixed hyperlipidemia: Secondary | ICD-10-CM | POA: Diagnosis not present

## 2024-10-20 DIAGNOSIS — M7989 Other specified soft tissue disorders: Secondary | ICD-10-CM

## 2024-10-20 DIAGNOSIS — I8312 Varicose veins of left lower extremity with inflammation: Secondary | ICD-10-CM

## 2024-10-20 DIAGNOSIS — I872 Venous insufficiency (chronic) (peripheral): Secondary | ICD-10-CM | POA: Diagnosis not present

## 2024-10-20 DIAGNOSIS — I8311 Varicose veins of right lower extremity with inflammation: Secondary | ICD-10-CM

## 2024-10-21 ENCOUNTER — Encounter (INDEPENDENT_AMBULATORY_CARE_PROVIDER_SITE_OTHER): Payer: Self-pay | Admitting: Vascular Surgery

## 2024-10-21 DIAGNOSIS — I872 Venous insufficiency (chronic) (peripheral): Secondary | ICD-10-CM | POA: Insufficient documentation

## 2024-10-21 DIAGNOSIS — M79669 Pain in unspecified lower leg: Secondary | ICD-10-CM | POA: Insufficient documentation

## 2024-10-21 NOTE — Progress Notes (Signed)
 "        MRN : [id number]  Nicole Daniels is a 73 y.o. (July 17, 1951) female who presents with chief complaint of legs hurt and swell.  History of Present Illness:   Patient is seen for evaluation of leg swelling. The patient first noticed the swelling remotely but is now concerned because of an increase in the overall edema.  She notes the left leg is worse than the right and this is the leg that she has a history of DVT in.  The swelling isn't associated with significant pain.  There has been an increasing amount of  discoloration noted by the patient. The patient notes that in the morning the legs are improved but they steadily worsened throughout the course of the day. Elevation seems to make the swelling of the legs better, dependency makes them much worse.   There is no history of ulcerations associated with the swelling.   The patient denies any recent changes in their medications.  The patient has not been wearing graduated compression.  The patient has no had any past angiography, interventions or vascular surgery.  The patient has a history of DVT and PE.  There is no history of primary lymphedema.  There is no history of radiation treatment to the groin or pelvis No history of malignancies. No history of trauma or groin or pelvic surgery. No history of foreign travel or parasitic infections area   Duplex ultrasound of the venous system obtained today demonstrates a normal deep venous system bilaterally.  No evidence of chronic changes or stricture or stenosis of the veins no deep venous insufficiency identified.  Superficial veins are patent.  No superficial reflux is identified.  Active Medications[1]  Past Medical History:  Diagnosis Date   Abdominal pain, right lower quadrant    Acquired cyst of kidney 01/08/2012   pt denies history of    Acute sinusitis, unspecified    Adenomatous colon polyp    Allergy    Angiomyolipoma    kidney   Anxiety    OCCASIONAL    Cataract    Chest pain    Chest pain, unspecified    Depressive disorder, not elsewhere classified 01/08/2012   not currently    Diverticulosis    Dizziness and giddiness    DVT (deep venous thrombosis) (HCC)    Dysfunction of eustachian tube    H/O Spinal surgery 02/20/2017   Hemorrhoids    History of diverticulitis of colon    History of fusion of cervical spine 04/25/2018   IBS (irritable bowel syndrome)    Liver lesion 2011   Neoplasm of skin of face    malignant   OA (osteoarthritis)    knee, NECK   Obesity, unspecified    Ostium secundum type atrial septal defect    Other and unspecified hyperlipidemia    Other malaise and fatigue    Palpitations    Pulmonary embolism (HCC)    Shortness of breath    Thyroid  disease    Ulcerative colitis (HCC)    Unspecified essential hypertension    pt states she does not have HTN   Unspecified sinusitis (chronic)    Unspecified vitamin D  deficiency     Past Surgical History:  Procedure Laterality Date   ABDOMINAL HYSTERECTOMY     BREAST REDUCTION SURGERY  1990   CERVICAL DISC SURGERY     Dr.Nudleman   CHOLECYSTECTOMY     COLONOSCOPY     DILATION AND CURETTAGE OF UTERUS  EYE SURGERY Bilateral 07/30/2017   cataract sx   FLEXIBLE SIGMOIDOSCOPY  01/08/2012   Procedure: FLEXIBLE SIGMOIDOSCOPY;  Surgeon: Princella CHRISTELLA Nida, MD;  Location: [facility];  Service: Endoscopy;  Laterality: N/A;   HEMORRHOID SURGERY     KNEE ARTHROTOMY     pt states no    LAMINECTOMY N/A 01/31/2017   Procedure: Thoracic Laminectomy and marsupialization of arachnoid cyst;  Surgeon: Lamar Peaches, MD;  Location: [facility] OR;  Service: Neurosurgery;  Laterality: N/A;   LEFT OOPHORECTOMY  1976   NECK SURGERY N/A 03/2018   PARTIAL HYSTERECTOMY     right ovary remains   REDUCTION MAMMAPLASTY     TONSILLECTOMY  1972   UPPER GASTROINTESTINAL ENDOSCOPY      Social History Social History[2]  Family History Family History  Problem Relation Age of Onset    Heart failure Mother    Hypertension Mother    Diabetes Mother    Cirrhosis Father    Heart disease Father    Heart disease Sister        a fib   Hypertension Sister    Diabetes Sister    Hypertension Brother    Heart disease Brother        x 2   Other Son        brain tumor, age 36   Breast cancer Cousin 28 - 63   Diabetes Cousin    Diabetes Other        aunt   Stroke Other        aunt   Colon cancer Neg Hx    Esophageal cancer Neg Hx    Stomach cancer Neg Hx    Rectal cancer Neg Hx    Colon polyps Neg Hx     Allergies[3]   REVIEW OF SYSTEMS (Negative unless checked)  Constitutional: [] Weight loss  [] Fever  [] Chills Cardiac: [] Chest pain   [] Chest pressure   [] Palpitations   [] Shortness of breath when laying flat   [] Shortness of breath with exertion. Vascular:  [] Pain in legs with walking   [x] Pain in legs at rest  [x] History of DVT   [] Phlebitis   [x] Swelling in legs   [] Varicose veins   [] Non-healing ulcers Pulmonary:   [] Uses home oxygen   [] Productive cough   [] Hemoptysis   [] Wheeze  [] COPD   [] Asthma Neurologic:  [] Dizziness   [] Seizures   [] History of stroke   [] History of TIA  [] Aphasia   [] Vissual changes   [] Weakness or numbness in arm   [] Weakness or numbness in leg Musculoskeletal:   [] Joint swelling   [] Joint pain   [] Low back pain Hematologic:  [] Easy bruising  [] Easy bleeding   [] Hypercoagulable state   [] Anemic Gastrointestinal:  [] Diarrhea   [] Vomiting  [] Gastroesophageal reflux/heartburn   [] Difficulty swallowing. Genitourinary:  [] Chronic kidney disease   [] Difficult urination  [] Frequent urination   [] Blood in urine Skin:  [] Rashes   [] Ulcers  Psychological:  [] History of anxiety   []  History of major depression.  Physical Examination  Vitals:   10/20/24 1455  BP: (!) 149/83  Pulse: 63  Resp: 17  Weight: 183 lb 12.8 oz (83.4 kg)  Height: 5' 4 (1.626 m)   Body mass index is 31.55 kg/m. Gen: WD/WN, NAD Head: Box/AT, No temporalis wasting.   Ear/Nose/Throat: Hearing grossly intact, nares w/o erythema or drainage, pinna without lesions Eyes: PER, EOMI, sclera nonicteric.  Neck: Supple, no gross masses.  No JVD.  Pulmonary:  Good air movement, no audible wheezing, no  use of accessory muscles.  Cardiac: RRR, precordium not hyperdynamic. Vascular:  scattered varicosities present bilaterally.  Moderate venous stasis changes to the legs bilaterally.  2-3+ soft pitting edema left leg more affected than the right leg. CEAP [id number]   Vessel Right Left  Radial Palpable Palpable  Gastrointestinal: soft, non-distended. No guarding/no peritoneal signs.  Musculoskeletal: M/S 5/5 throughout.  No deformity.  Neurologic: CN 2-12 intact. Pain and light touch intact in extremities.  Symmetrical.  Speech is fluent. Motor exam as listed above. Psychiatric: Judgment intact, Mood & affect appropriate for pt's clinical situation. Dermatologic: Venous rashes no ulcers noted.  No changes consistent with cellulitis. Lymph : No lichenification or skin changes of chronic lymphedema.  CBC Lab Results  Component Value Date   WBC 5.5 09/16/2024   HGB 13.1 09/16/2024   HCT 39.3 09/16/2024   MCV 88.7 09/16/2024   PLT 405.0 (H) 09/16/2024    BMET    Component Value Date/Time   NA 141 10/10/2024 1513   K 4.1 10/10/2024 1513   CL 102 10/10/2024 1513   CO2 25 10/10/2024 1513   GLUCOSE 75 10/10/2024 1513   GLUCOSE 162 (H) 09/29/2024 0943   BUN 18 10/10/2024 1513   CREATININE 0.93 10/10/2024 1513   CALCIUM  10.1 10/10/2024 1513   GFRNONAA >60 06/24/2024 0126   GFRAA 89 07/01/2020 0949   Estimated Creatinine Clearance: 55.5 mL/min (by C-G formula based on SCr of 0.93 mg/dL).  COAG Lab Results  Component Value Date   INR 1.2 06/24/2024   INR 0.9 07/22/2008    Radiology No results found.   Assessment/Plan 1. Pain and swelling of lower leg, unspecified laterality (Primary) Recommend:  No surgery or intervention at this point in  time.  I have reviewed my discussion with the patient regarding venous insufficiency and why it causes symptoms. I have discussed with the patient the chronic skin changes that accompany venous insufficiency and the long term sequela such as ulceration. Patient will contnue wearing graduated compression stockings on a daily basis, as this has provided excellent control of his edema. The patient will put the stockings on first thing in the morning and removing them in the evening. The patient is reminded not to sleep in the stockings.  In addition, behavioral modification including elevation during the day will be initiated. Exercise is strongly encouraged.  Previous duplex ultrasound of the lower extremities shows normal deep system, no significant superficial reflux was identified.  Given the patient's good control and lack of any problems regarding the venous insufficiency and lymphedema a lymph pump in not need at this time.    The patient will follow up with me PRN should anything change.  The patient voices agreement with this plan.  2. Chronic venous insufficiency Recommend:  No surgery or intervention at this point in time.  I have reviewed my discussion with the patient regarding venous insufficiency and why it causes symptoms. I have discussed with the patient the chronic skin changes that accompany venous insufficiency and the long term sequela such as ulceration. Patient will contnue wearing graduated compression stockings on a daily basis, as this has provided excellent control of his edema. The patient will put the stockings on first thing in the morning and removing them in the evening. The patient is reminded not to sleep in the stockings.  In addition, behavioral modification including elevation during the day will be initiated. Exercise is strongly encouraged.  Previous duplex ultrasound of the lower extremities shows normal deep system,  no significant superficial reflux was  identified.  Given the patient's good control and lack of any problems regarding the venous insufficiency and lymphedema a lymph pump in not need at this time.    The patient will follow up with me PRN should anything change.  The patient voices agreement with this plan.  3. Mixed hyperlipidemia Continue statin as ordered and reviewed, no changes at this time    Cordella Shawl, MD  10/21/2024 3:24 PM      [1]  Current Meds  Medication Sig   acetaminophen  (TYLENOL ) 325 MG tablet Take 650 mg by mouth every 6 (six) hours as needed.   chlorpheniramine  (ALLERGY RELIEF) 4 MG tablet Take 1 tablet (4 mg total) by mouth 2 (two) times daily as needed for allergies.   Cholecalciferol  (VITAMIN D3) 50 MCG (2000 UT) capsule Take 2,000 Units by mouth every other day.   cyanocobalamin  (VITAMIN B12) 500 MCG tablet Take 500 mcg by mouth daily.   diphenhydrAMINE  (DIPHENHIST) 25 mg capsule Take 25 mg by mouth every 6 (six) hours as needed (Prior to infusion).   ENTYVIO 300 MG injection Inject 300 mg into the vein every 6 (six) weeks.   fluticasone  (FLONASE ) 50 MCG/ACT nasal spray Place 1 spray into both nostrils as needed for allergies.    furosemide  (LASIX ) 40 MG tablet Take 1 tablet (40 mg total) by mouth daily.   levothyroxine  (SYNTHROID ) 75 MCG tablet TAKE 1 TABLET EVERY DAY   ondansetron  (ZOFRAN ) 4 MG tablet Take 1 tablet (4 mg total) by mouth every 8 (eight) hours as needed for nausea or vomiting.   potassium chloride  SA (KLOR-CON  M) 20 MEQ tablet Take 1 tablet (20 mEq total) by mouth daily.  [2]  Social History Tobacco Use   Smoking status: Never   Smokeless tobacco: Never  Vaping Use   Vaping status: Never Used  Substance Use Topics   Alcohol use: No   Drug use: No  [3]  Allergies Allergen Reactions   Statins Other (See Comments)    Has severe leg muscle aches and cramps   Zocor [Simvastatin] Other (See Comments)    MYALGIAS   Zetia  [Ezetimibe ] Other (See Comments)    myalgias    Bactrim [Sulfamethoxazole -Trimethoprim] Itching   Zoloft  [Sertraline ] Anxiety   "

## 2024-10-24 ENCOUNTER — Ambulatory Visit
Admission: RE | Admit: 2024-10-24 | Discharge: 2024-10-24 | Disposition: A | Discharge status: Home / Self Care | Source: Ambulatory Visit | Attending: Family | Admitting: Family

## 2024-10-24 DIAGNOSIS — Z1231 Encounter for screening mammogram for malignant neoplasm of breast: Secondary | ICD-10-CM

## 2024-10-31 ENCOUNTER — Ambulatory Visit: Payer: Self-pay | Admitting: Family

## 2024-11-03 ENCOUNTER — Ambulatory Visit: Admitting: Internal Medicine

## 2024-11-03 ENCOUNTER — Other Ambulatory Visit (INDEPENDENT_AMBULATORY_CARE_PROVIDER_SITE_OTHER)

## 2024-11-03 ENCOUNTER — Encounter: Payer: Self-pay | Admitting: Internal Medicine

## 2024-11-03 ENCOUNTER — Ambulatory Visit: Payer: Self-pay | Admitting: Internal Medicine

## 2024-11-03 VITALS — BP 122/78 | HR 69 | Ht 63.0 in | Wt 180.5 lb

## 2024-11-03 DIAGNOSIS — K649 Unspecified hemorrhoids: Secondary | ICD-10-CM

## 2024-11-03 DIAGNOSIS — K5180 Other ulcerative colitis without complications: Secondary | ICD-10-CM | POA: Diagnosis not present

## 2024-11-03 DIAGNOSIS — Z8719 Personal history of other diseases of the digestive system: Secondary | ICD-10-CM | POA: Diagnosis not present

## 2024-11-03 DIAGNOSIS — Z860101 Personal history of adenomatous and serrated colon polyps: Secondary | ICD-10-CM

## 2024-11-03 DIAGNOSIS — K51811 Other ulcerative colitis with rectal bleeding: Secondary | ICD-10-CM | POA: Diagnosis not present

## 2024-11-03 DIAGNOSIS — Z7960 Long term (current) use of unspecified immunomodulators and immunosuppressants: Secondary | ICD-10-CM | POA: Diagnosis not present

## 2024-11-03 DIAGNOSIS — M2550 Pain in unspecified joint: Secondary | ICD-10-CM | POA: Diagnosis not present

## 2024-11-03 LAB — CBC WITH DIFFERENTIAL/PLATELET
Basophils Absolute: 0 K/uL (ref 0.0–0.1)
Basophils Relative: 0.4 % (ref 0.0–3.0)
Eosinophils Absolute: 0.2 K/uL (ref 0.0–0.7)
Eosinophils Relative: 3.2 % (ref 0.0–5.0)
HCT: 43.1 % (ref 36.0–46.0)
Hemoglobin: 14.3 g/dL (ref 12.0–15.0)
Lymphocytes Relative: 33.6 % (ref 12.0–46.0)
Lymphs Abs: 2 K/uL (ref 0.7–4.0)
MCHC: 33.1 g/dL (ref 30.0–36.0)
MCV: 87.3 fl (ref 78.0–100.0)
Monocytes Absolute: 0.4 K/uL (ref 0.1–1.0)
Monocytes Relative: 6.8 % (ref 3.0–12.0)
Neutro Abs: 3.3 K/uL (ref 1.4–7.7)
Neutrophils Relative %: 56 % (ref 43.0–77.0)
Platelets: 388 K/uL (ref 150.0–400.0)
RBC: 4.93 Mil/uL (ref 3.87–5.11)
RDW: 14.3 % (ref 11.5–15.5)
WBC: 5.9 K/uL (ref 4.0–10.5)

## 2024-11-03 LAB — IBC + FERRITIN
Ferritin: 19.7 ng/mL (ref 10.0–291.0)
Iron: 104 ug/dL (ref 42–145)
Saturation Ratios: 22.4 % (ref 20.0–50.0)
TIBC: 464.8 ug/dL — ABNORMAL HIGH (ref 250.0–450.0)
Transferrin: 332 mg/dL (ref 212.0–360.0)

## 2024-11-03 LAB — BASIC METABOLIC PANEL WITH GFR
BUN: 11 mg/dL (ref 6–23)
CO2: 30 meq/L (ref 19–32)
Calcium: 9.3 mg/dL (ref 8.4–10.5)
Chloride: 104 meq/L (ref 96–112)
Creatinine, Ser: 0.77 mg/dL (ref 0.40–1.20)
GFR: 75.85 mL/min
Glucose, Bld: 93 mg/dL (ref 70–99)
Potassium: 3.9 meq/L (ref 3.5–5.1)
Sodium: 141 meq/L (ref 135–145)

## 2024-11-03 LAB — VITAMIN D 25 HYDROXY (VIT D DEFICIENCY, FRACTURES): VITD: 37.88 ng/mL (ref 30.00–100.00)

## 2024-11-03 LAB — C-REACTIVE PROTEIN: CRP: <0.5 mg/dL — ABNORMAL LOW (ref 1.0–20.0)

## 2024-11-03 LAB — B12 AND FOLATE PANEL
Folate: 18 ng/mL
Vitamin B-12: 371 pg/mL (ref 211–911)

## 2024-11-03 MED ORDER — HYDROCORTISONE (PERIANAL) 2.5 % EX CREA: 1.0000 | TOPICAL_CREAM | Freq: Two times a day (BID) | CUTANEOUS | 1 refills | Status: AC

## 2024-11-03 NOTE — Progress Notes (Signed)
 "    11/03/2024 Nicole Daniels [id number] 03-28-1951   ASSESSMENT AND PLAN:  Ulcerative colitis diagnosed April 2025 after flexible sigmoidoscopy Predominant symptoms diarrhea and rectal bleeding which have resolved Failed budesonide , Rowasa  enemas, Lialda  4.8 g Allergic reaction to Tremfya (lip swelling, throat swelling) Entyvio started 07/25/2024,last injection Nov 7th, next injection Jan Patient will let me know how she reacts to her next Entyvio injection. If she does not tolerate this well, then will need to make a decision about whether or switch therapies or add on sulfasalazine. Patient may have limited options since she has already had an allergic reaction to Tremfya. - Check CBC, BMP, CRP, ferritin/IBC, vit D, vit B12 - Cont Entyvio. Patient will let me know how she does after her next dose. Can consider adding on sulfasalazine to see if this helps with her joint pains - Consider fecal calprotectin during next visit since this was last elevated in 08/2024 - Next colonoscopy due in 6 months after being on Entyvio - RTC in 2 months  Polyarthalgias Bilateral hands, ankles, knees, not daily - consider referral to rheumatology  History of hemorrhoids Status post one banding session, then felt better  Personal history of tubular adenomatous polyps 05/08/2023 5 polyps, nonbleeding external/internal hemorrhoids  History of diverticulosis 02/11/2024 CT acute uncomplicated diverticulitis treated with Augmentin  No AB pain Will call if any symptoms. Add on fiber supplement, avoid NSAIDS, information given  Estefana Kidney, MD  Patient Care Team: Corwin Antu, FNP as PCP - General (Family Medicine) Louis Shove, MD as Consulting Physician (Neurosurgery) Penne Knee, MD (Inactive) as Consulting Physician (Urology) Lonni Slain, MD as Consulting Physician (Cardiology) Jacobo Evalene PARAS, MD as Consulting Physician (Hematology and Oncology) Portia Fireman, OD  (Optometry)  HISTORY OF PRESENT ILLNESS: 74 y.o. female presents for follow up of ulcerative colitis.  IBD history: Diagnosed April 2025 after flexible sigmoidoscopy with symptoms of diarrhea and rectal bleeding. Budesonide  x 2 months with initial help for 6 weeks reoccurring last 2 weeks of medication Rowasa  enemas April 2025 without help May 2025 oral mesalamine  Lialda  4.8 g 03/06/2024 CT diffuse colonic wall thickening compatible with colitis May 2025 fecal calprotectin 3330 Started on Tremfya with anaphylaxis Repeat prednisone  taper 05/09/2024 fecal calprotectin 4080 Started on Entyvio x 07/25/2024 Treated for sinusitis per primary care  Last colonoscopy: 05/08/2023 5 polyps, nonbleeding external/internal hemorrhoids 01/29/2024 flexible sigmoidoscopy - Diffuse inflammation was found in the rectum, in the sigmoid colon and in the descending colon. Biopsied.  - Diverticulosis in the sigmoid colon.  - Non- bleeding internal hemorrhoids. Path: showed that she has left-sided inflammation in her colon as a result of ulcerative colitis.  Last small bowel imaging: 06/13/2024 CTAP W no evidence of active UC, mild diverticulosis without evidence of acute diverticulitis unremarkable liver, gallbladder pancreas and spleen Extraintestinal manifestations: ? arthralgias Surgical history: no surgery.  Other significant medical history: History of DVT not a candidate for Jak inhibitor Vitamin D  deficiency B12 deficiency History of hemorrhoids IDA  Current History She is feeling better today. She is still on Entyvio, next dose in 1-2 weeks. Denies dizziness or balance issues. She is still getting nerve blocks in the back of her head and got one in the last month. She still has nasal congestion but this has not worsened. Denies fevers. She will have 3 BMs per day, and normally she has had really good solid BMs. Denies rectal bleeding. Joint pains are getting better, but she is worried about the joint  pains coming back after  her next dose of Entyvio. Her LE swelling has improved  Inflammatory markers    Latest Ref Rng & Units 08/06/2024   10:52 09/16/2024   09:27 09/17/2024   10:12  Inflammatory Markers  Calprotectin mcg/g 126                                             Reference Range:                                       <50     Normal                                       50-120  Borderline                                       >120    Elevated . Calprotectin in Crohn's disease and ulcerative colitis can be five to several thousand times above the reference population (50 mcg/g or less). Levels are usually 50 mcg/g or less in healthy patients and with irritable bowel syndrome. Repeat testing in 4-6 weeks is suggested for borderline values.   121                                             Reference Range:                                       <50     Normal                                       50-120  Borderline                                       >120    Elevated . Calprotectin in Crohn's disease and ulcerative colitis can be five to several thousand times above the reference population (50 mcg/g or less). Levels are usually 50 mcg/g or less in healthy patients and with irritable bowel syndrome. Repeat testing in 4-6 weeks is suggested for borderline values.   Sed Rate 0 - 30 mm/hr  7    C-Reactive Protein, Cardiac 0.000 - 5.000 mg/L  3.150       Note:  An elevated hs-CRP (>5 mg/L) should be repeated after 2 weeks to rule out recent infection or trauma.     Recent labs: 05/12/2024 TB GOLD NEGATIVE 05/12/2024  HepBsAG NON-REACTIVE  06/16/2024 CRP <1.0  03/07/2024 SED RATE 23 Fecal cal 05/09/2024 4080 07/08/2024 WBC 8.3 HGB 13.4 MCV 92.0 Platelets 425.0 07/08/2024 Iron 93 Ferritin 19.9  B12 312 FOLATE 21.4 06/16/2024 AST 13; 13 ALT 18; 18 Alkphos 73; 73 TBili 0.6; 0.6 05/08/2024 VITAMIN D  37.47  TB  GOLD 05/12/2024 NEGATIVE or TB skin if indeterminate.  HepBsAG  05/12/2024 NON-REACTIVE  TPMT Activity: No results    IBD Health Care Maintenance: Annual Flu Vaccine - suggest getting Pneumococcal Vaccine if receiving immunosuppression: -  UTD TB testing if on anti-TNF, yearly - UTD Vitamin D  screening - Will update today COVID vaccine suggest getting Shingrix suggest getting  Immunization History  Administered Date(s) Administered   Hepb-cpg 05/22/2024   Influenza,inj,Quad PF,6+ Mos 09/01/2015   PFIZER(Purple Top)SARS-COV-2 Vaccination 06/25/2020, 07/16/2020   Pneumococcal Polysaccharide-23 05/29/2022   Td 10/30/1988, 08/25/2008   Micronutrient evaluation:    Latest Ref Rng & Units 12/31/2023   10:44 05/08/2024   15:51 07/08/2024   10:53  Vitamins and Micronutrients  Ferritin 10.0 - 291.0 ng/mL 133.3  26.2  19.9   Iron 42 - 145 ug/dL 55  82  93   TIBC 749.9 - 450.0 mcg/dL 669.5  609.3  568.7   Folate >5.9 ng/mL  21.4      RELEVANT GI HISTORY, LABS, IMAGING:  CBC    Component Value Date/Time   WBC 5.5 09/16/2024 0927   RBC 4.44 09/16/2024 0927   HGB 13.1 09/16/2024 0927   HCT 39.3 09/16/2024 0927   PLT 405.0 (H) 09/16/2024 0927   MCV 88.7 09/16/2024 0927   MCH 30.3 06/25/2024 0225   MCHC 33.4 09/16/2024 0927   RDW 12.7 09/16/2024 0927   LYMPHSABS 1.5 09/16/2024 0927   MONOABS 0.5 09/16/2024 0927   EOSABS 0.2 09/16/2024 0927   BASOSABS 0.0 09/16/2024 0927   Recent Labs    06/07/24 0930 06/13/24 1823 06/16/24 1406 06/24/24 0126 06/24/24 1408 06/25/24 0225 07/08/24 1053 08/04/24 1423 09/11/24 0853 09/16/24 0927  HGB 13.1 14.0 14.9 14.6 12.9 12.9 13.4 13.1 13.6 13.1    CMP     Component Value Date/Time   NA 141 10/10/2024 1513   K 4.1 10/10/2024 1513   CL 102 10/10/2024 1513   CO2 25 10/10/2024 1513   GLUCOSE 75 10/10/2024 1513   GLUCOSE 162 (H) 09/29/2024 0943   BUN 18 10/10/2024 1513   CREATININE 0.93 10/10/2024 1513   CALCIUM  10.1 10/10/2024 1513   PROT 6.5 09/16/2024 0927   ALBUMIN 3.9 09/16/2024 0927    AST 17 09/16/2024 0927   ALT 12 09/16/2024 0927   ALKPHOS 75 09/16/2024 0927   BILITOT 0.6 09/16/2024 0927   GFRNONAA >60 06/24/2024 0126   GFRAA 89 07/01/2020 0949      Latest Ref Rng & Units 09/16/2024    9:27 AM 09/11/2024    8:53 AM 08/04/2024    2:23 PM  Hepatic Function  Total Protein 6.0 - 8.3 g/dL 6.5  6.5  6.4   Albumin 3.5 - 5.2 g/dL 3.9  4.0  3.9   AST 0 - 37 U/L 17  19  20    ALT 0 - 35 U/L 12  12  16    Alk Phosphatase 39 - 117 U/L 75  77  61   Total Bilirubin 0.2 - 1.2 mg/dL 0.6  0.8  0.4   Bilirubin, Direct 0.0 - 0.3 mg/dL 0.0   0.0       Current Medications:   Current Outpatient Medications (Endocrine & Metabolic):    levothyroxine  (SYNTHROID ) 75 MCG tablet, TAKE 1 TABLET EVERY DAY  Current Outpatient Medications (Cardiovascular):    furosemide  (LASIX ) 40 MG tablet, Take 1 tablet (40 mg total) by mouth daily.  Current Outpatient Medications (Respiratory):    chlorpheniramine  (ALLERGY RELIEF) 4 MG tablet, Take 1  tablet (4 mg total) by mouth 2 (two) times daily as needed for allergies.   diphenhydrAMINE  (DIPHENHIST) 25 mg capsule, Take 25 mg by mouth every 6 (six) hours as needed (Prior to infusion).   fluticasone  (FLONASE ) 50 MCG/ACT nasal spray, Place 1 spray into both nostrils as needed for allergies.   Current Outpatient Medications (Analgesics):    acetaminophen  (TYLENOL ) 325 MG tablet, Take 650 mg by mouth every 6 (six) hours as needed.  Current Outpatient Medications (Hematological):    cyanocobalamin  (VITAMIN B12) 500 MCG tablet, Take 500 mcg by mouth daily.  Current Outpatient Medications (Other):    Cholecalciferol  (VITAMIN D3) 50 MCG (2000 UT) capsule, Take 2,000 Units by mouth every other day.   ENTYVIO 300 MG injection, Inject 300 mg into the vein every 6 (six) weeks.   ondansetron  (ZOFRAN ) 4 MG tablet, Take 1 tablet (4 mg total) by mouth every 8 (eight) hours as needed for nausea or vomiting.   potassium chloride  SA (KLOR-CON  M) 20 MEQ tablet,  Take 1 tablet (20 mEq total) by mouth daily.  Medical History:  Past Medical History:  Diagnosis Date   Abdominal pain, right lower quadrant    Acquired cyst of kidney 01/08/2012   pt denies history of    Acute sinusitis, unspecified    Adenomatous colon polyp    Allergy    Angiomyolipoma    kidney   Anxiety    OCCASIONAL   Cataract    Chest pain    Chest pain, unspecified    Depressive disorder, not elsewhere classified 01/08/2012   not currently    Diverticulosis    Dizziness and giddiness    DVT (deep venous thrombosis) (HCC)    Dysfunction of eustachian tube    H/O Spinal surgery 02/20/2017   Hemorrhoids    History of diverticulitis of colon    History of fusion of cervical spine 04/25/2018   IBS (irritable bowel syndrome)    Liver lesion 2011   Neoplasm of skin of face    malignant   OA (osteoarthritis)    knee, NECK   Obesity, unspecified    Ostium secundum type atrial septal defect    Other and unspecified hyperlipidemia    Other malaise and fatigue    Palpitations    Pulmonary embolism (HCC)    Shortness of breath    Thyroid  disease    Ulcerative colitis (HCC)    Unspecified essential hypertension    pt states she does not have HTN   Unspecified sinusitis (chronic)    Unspecified vitamin D  deficiency    Allergies:  Allergies  Allergen Reactions   Statins Other (See Comments)    Has severe leg muscle aches and cramps   Zocor [Simvastatin] Other (See Comments)    MYALGIAS   Zetia  [Ezetimibe ] Other (See Comments)    myalgias   Bactrim [Sulfamethoxazole -Trimethoprim] Itching   Zoloft  [Sertraline ] Anxiety     Surgical History:  She  has a past surgical history that includes Partial hysterectomy; Dilation and curettage of uterus; Cholecystectomy; Breast reduction surgery (1990); Tonsillectomy (1972); Left oophorectomy (1976); Abdominal hysterectomy; Flexible sigmoidoscopy (01/08/2012); Knee arthrotomy; Colonoscopy; Hemorrhoid surgery; Laminectomy (N/A,  01/31/2017); Eye surgery (Bilateral, 07/30/2017); Cervical disc surgery; Reduction mammaplasty; Neck surgery (N/A, 03/2018); and Upper gastrointestinal endoscopy. Family History:  Her family history includes Breast cancer (age of onset: 9 - 59) in her cousin; Cirrhosis in her father; Diabetes in her cousin, mother, sister, and another family member; Heart disease in her brother, father, and sister;  Heart failure in her mother; Hypertension in her brother, mother, and sister; Other in her son; Stroke in an other family member.   PHYSICAL EXAM: BP 122/78   Pulse 69   Ht 5' 3 (1.6 m)   Wt 180 lb 8 oz (81.9 kg)   BMI 31.97 kg/m  Physical Exam   General: Well appearing Resp: CTAB CV: Regular rate Abd: Non-tender  Rosario JAYSON Kidney, MD 8:56 AM  I spent 35 minutes of time, including in depth chart review, independent review of results as outlined above, communicating results with the patient directly, face-to-face time with the patient, coordinating care, and ordering studies and medications as appropriate, and documentation.  "

## 2024-11-03 NOTE — Patient Instructions (Addendum)
 Your provider has requested that you go to the basement level for lab work before leaving today. Press B on the elevator. The lab is located at the first door on the left as you exit the elevator.  Due to recent changes in healthcare laws, you may see the results of your imaging and laboratory studies on MyChart before your provider has had a chance to review them.  We understand that in some cases there may be results that are confusing or concerning to you. Not all laboratory results come back in the same time frame and the provider may be waiting for multiple results in order to interpret others.  Please give us  48 hours in order for your provider to thoroughly review all the results before contacting the office for clarification of your results.    We have sent the following medications to your pharmacy for you to pick up at your convenience: Anusol  HC 2.5% rectal cream  Thank you for entrusting me with your care and for choosing [facility], Dr. Estefana Kidney  _______________________________________________________  If your blood pressure at your visit was 140/90 or greater, please contact your primary care physician to follow up on this.  _______________________________________________________  If you are age 75 or older, your body mass index should be between 23-30. Your Body mass index is 31.97 kg/m. If this is out of the aforementioned range listed, please consider follow up with your Primary Care Provider.  If you are age 75 or younger, your body mass index should be between 19-25. Your Body mass index is 31.97 kg/m. If this is out of the aformentioned range listed, please consider follow up with your Primary Care Provider.   ________________________________________________________  The [facility] providers would like to encourage you to use MYCHART to communicate with providers for non-urgent requests or questions.  Due to long hold times on the telephone, sending your  provider a message by [facility] may be a faster and more efficient way to get a response.  Please allow 48 business hours for a response.  Please remember that this is for non-urgent requests.  _______________________________________________________  [facility] is using a team-based approach to care.  Your team is made up of your doctor and two to three APPS. Our APPS (Nurse Practitioners and Physician Assistants) work with your physician to ensure care continuity for you. They are fully qualified to address your health concerns and develop a treatment plan. They communicate directly with your gastroenterologist to care for you. Seeing the Advanced Practice Practitioners on your physician's team can help you by facilitating care more promptly, often allowing for earlier appointments, access to diagnostic testing, procedures, and other specialty referrals.

## 2024-11-06 NOTE — Progress Notes (Unsigned)
 "  Office Visit Note  Patient: Nicole Daniels             Date of Birth: 1950-11-07           MRN: [id number]             PCP: Corwin Antu, FNP Referring: Craig Alan SAUNDERS, PA-C Visit Date: 11/20/2024 Occupation: Data Unavailable  Subjective:  No chief complaint on file.   History of Present Illness: Nicole Daniels is a 74 y.o. female ***     Activities of Daily Living:  Patient reports morning stiffness for *** {minute/hour:19697}.   Patient {ACTIONS;DENIES/REPORTS:21021675::Denies} nocturnal pain.  Difficulty dressing/grooming: {ACTIONS;DENIES/REPORTS:21021675::Denies} Difficulty climbing stairs: {ACTIONS;DENIES/REPORTS:21021675::Denies} Difficulty getting out of chair: {ACTIONS;DENIES/REPORTS:21021675::Denies} Difficulty using hands for taps, buttons, cutlery, and/or writing: {ACTIONS;DENIES/REPORTS:21021675::Denies}  No Rheumatology ROS completed.   PMFS History:  Patient Active Problem List   Diagnosis Date Noted   Pain and swelling of lower leg 10/21/2024   Chronic venous insufficiency 10/21/2024   Acute diastolic congestive heart failure (HCC) 09/11/2024   Pain and swelling of left lower leg 07/30/2024   History of DVT of lower extremity 07/10/2024   Epistaxis 06/24/2024   Hypertensive urgency 06/24/2024   History of DVT (deep vein thrombosis) 06/24/2024   History of pulmonary embolus (PE) 06/24/2024   Hypertriglyceridemia 06/24/2024   Pruritus 01/11/2024   Hypokalemia 12/31/2023   Melena 12/31/2023   History of colon polyps 05/15/2023   Vaginal itching 03/21/2023   Benign paroxysmal positional vertigo due to bilateral vestibular disorder 03/21/2023   Recurrent sinusitis 02/14/2023   Class 1 obesity due to excess calories without serious comorbidity with body mass index (BMI) of 30.0 to 30.9 in adult 11/29/2022   Post-herpetic trigeminal neuralgia 11/29/2022   B12 deficiency 02/28/2022   Cyst of right kidney 12/20/2021    Angiomyolipoma of right kidney 12/20/2021   Benign essential microscopic hematuria 12/20/2021   Hypothyroidism 11/28/2021   Osteopenia 10/06/2021   Presbycusis of both ears 09/10/2019   Postural kyphosis 09/05/2019   Tinnitus aurium, bilateral 08/29/2019   Acquired scoliosis 10/17/2018   Intradural arachnoid cyst of spine 01/31/2017   Cervical radiculopathy 12/26/2016   Vitamin D  deficiency 04/08/2010   Obesity (BMI 30.0-34.9) 08/11/2008   PATENT FORAMEN OVALE 01/28/2005   Mixed hyperlipidemia 03/30/2001    Past Medical History:  Diagnosis Date   Abdominal pain, right lower quadrant    Acquired cyst of kidney 01/08/2012   pt denies history of    Acute sinusitis, unspecified    Adenomatous colon polyp    Allergy    Angiomyolipoma    kidney   Anxiety    OCCASIONAL   Cataract    Chest pain    Chest pain, unspecified    Depressive disorder, not elsewhere classified 01/08/2012   not currently    Diverticulosis    Dizziness and giddiness    DVT (deep venous thrombosis) (HCC)    Dysfunction of eustachian tube    H/O Spinal surgery 02/20/2017   Hemorrhoids    History of diverticulitis of colon    History of fusion of cervical spine 04/25/2018   IBS (irritable bowel syndrome)    Liver lesion 2011   Neoplasm of skin of face    malignant   OA (osteoarthritis)    knee, NECK   Obesity, unspecified    Ostium secundum type atrial septal defect    Other and unspecified hyperlipidemia    Other malaise and fatigue    Palpitations    Pulmonary embolism (  HCC)    Shortness of breath    Thyroid  disease    Ulcerative colitis (HCC)    Unspecified essential hypertension    pt states she does not have HTN   Unspecified sinusitis (chronic)    Unspecified vitamin D  deficiency     Family History  Problem Relation Age of Onset   Heart failure Mother    Hypertension Mother    Diabetes Mother    Cirrhosis Father    Heart disease Father    Heart disease Sister        a fib    Hypertension Sister    Diabetes Sister    Hypertension Brother    Heart disease Brother        x 2   Other Son        brain tumor, age 44   Breast cancer Cousin 28 - 24   Diabetes Cousin    Diabetes Other        aunt   Stroke Other        aunt   Colon cancer Neg Hx    Esophageal cancer Neg Hx    Stomach cancer Neg Hx    Rectal cancer Neg Hx    Colon polyps Neg Hx    Past Surgical History:  Procedure Laterality Date   ABDOMINAL HYSTERECTOMY     BREAST REDUCTION SURGERY  1990   CERVICAL DISC SURGERY     Dr.Nudleman   CHOLECYSTECTOMY     COLONOSCOPY     DILATION AND CURETTAGE OF UTERUS     EYE SURGERY Bilateral 07/30/2017   cataract sx   FLEXIBLE SIGMOIDOSCOPY  01/08/2012   Procedure: FLEXIBLE SIGMOIDOSCOPY;  Surgeon: Princella CHRISTELLA Nida, MD;  Location: [facility];  Service: Endoscopy;  Laterality: N/A;   HEMORRHOID SURGERY     KNEE ARTHROTOMY     pt states no    LAMINECTOMY N/A 01/31/2017   Procedure: Thoracic Laminectomy and marsupialization of arachnoid cyst;  Surgeon: Lamar Peaches, MD;  Location: [facility] OR;  Service: Neurosurgery;  Laterality: N/A;   LEFT OOPHORECTOMY  1976   NECK SURGERY N/A 03/2018   PARTIAL HYSTERECTOMY     right ovary remains   REDUCTION MAMMAPLASTY     TONSILLECTOMY  1972   UPPER GASTROINTESTINAL ENDOSCOPY     Social History[1] Social History   Social History Narrative   Daily caffeine      Immunization History  Administered Date(s) Administered   Hepb-cpg 05/22/2024   Influenza,inj,Quad PF,6+ Mos 09/01/2015   PFIZER(Purple Top)SARS-COV-2 Vaccination 06/25/2020, 07/16/2020   Pneumococcal Polysaccharide-23 05/29/2022   Td 10/30/1988, 08/25/2008     Objective: Vital Signs: There were no vitals taken for this visit.   Physical Exam   Musculoskeletal Exam: ***  CDAI Exam: CDAI Score: -- Patient Global: --; Provider Global: -- Swollen: --; Tender: -- Joint Exam 11/20/2024   No joint exam has been documented for this visit    There is currently no information documented on the homunculus. Go to the Rheumatology activity and complete the homunculus joint exam.  Investigation: No additional findings.  Imaging: MM 3D SCREENING MAMMOGRAM BILATERAL BREAST Result Date: 10/29/2024 CLINICAL DATA:  Screening. EXAM: DIGITAL SCREENING BILATERAL MAMMOGRAM WITH TOMOSYNTHESIS AND CAD TECHNIQUE: Bilateral screening digital craniocaudal and mediolateral oblique mammograms were obtained. Bilateral screening digital breast tomosynthesis was performed. The images were evaluated with computer-aided detection. COMPARISON:  Previous exam(s). ACR Breast Density Category a: The breasts are almost entirely fatty. FINDINGS: There are no findings suspicious for malignancy.  IMPRESSION: No mammographic evidence of malignancy. A result letter of this screening mammogram will be mailed directly to the patient. RECOMMENDATION: Screening mammogram in one year. (Code:SM-B-01Y) BI-RADS CATEGORY  1: Negative. Electronically Signed   By: Dina  Arceo M.D.   On: 10/29/2024 11:40   VAS US  LOWER EXTREMITY VENOUS REFLUX Result Date: 10/27/2024  Lower Venous Reflux Study Patient Name:  NAKAI YARD  Date of Exam:   10/20/2024 Medical Rec #: [id number]             Accession #:    [id number] Date of Birth: 1951-03-22              Patient Gender: F Patient Age:   44 years Exam Location:  [facility] Procedure:      VAS US  LOWER EXTREMITY VENOUS REFLUX Referring Phys: ORVIN DARING --------------------------------------------------------------------------------  Indications: Pain, and varicosities.  Performing Technologist: Leafy Gibes RVS  Examination Guidelines: A complete evaluation includes B-mode imaging, spectral Doppler, color Doppler, and power Doppler as needed of all accessible portions of each vessel. Bilateral testing is considered an integral part of a complete examination. Limited examinations for reoccurring indications may be  performed as noted. The reflux portion of the exam is performed with the patient in reverse Trendelenburg. Significant venous reflux is defined as >500 ms in the superficial venous system, and >1 second in the deep venous system.  Venous Reflux Times +--------------+---------+------+-----------+------------+--------+ RIGHT         Reflux NoRefluxReflux TimeDiameter cmsComments                         Yes                                  +--------------+---------+------+-----------+------------+--------+ CFV           no                                             +--------------+---------+------+-----------+------------+--------+ FV prox       no                                             +--------------+---------+------+-----------+------------+--------+ FV mid        no                                             +--------------+---------+------+-----------+------------+--------+ FV dist       no                                             +--------------+---------+------+-----------+------------+--------+ Popliteal     no                                             +--------------+---------+------+-----------+------------+--------+ GSV at SFJ    no                            .  40              +--------------+---------+------+-----------+------------+--------+ GSV prox thighno                            .51              +--------------+---------+------+-----------+------------+--------+ GSV mid thigh no                            .35              +--------------+---------+------+-----------+------------+--------+ GSV dist thighno                            .44              +--------------+---------+------+-----------+------------+--------+ GSV at knee   no                            .42              +--------------+---------+------+-----------+------------+--------+ GSV prox calf no                            .35               +--------------+---------+------+-----------+------------+--------+ SSV at [facility]    no                            .32              +--------------+---------+------+-----------+------------+--------+  +--------------+---------+------+-----------+------------+--------+ LEFT          Reflux NoRefluxReflux TimeDiameter cmsComments                         Yes                                  +--------------+---------+------+-----------+------------+--------+ CFV           no                                             +--------------+---------+------+-----------+------------+--------+ FV prox       no                                             +--------------+---------+------+-----------+------------+--------+ FV mid        no                                             +--------------+---------+------+-----------+------------+--------+ FV dist       no                                             +--------------+---------+------+-----------+------------+--------+ Popliteal     no                                             +--------------+---------+------+-----------+------------+--------+  GSV at [facility]    no                            .67              +--------------+---------+------+-----------+------------+--------+ GSV prox thighno                            .46              +--------------+---------+------+-----------+------------+--------+ GSV mid thigh no                            .36              +--------------+---------+------+-----------+------------+--------+ GSV dist thighno                            .38              +--------------+---------+------+-----------+------------+--------+ GSV at knee   no                            .37              +--------------+---------+------+-----------+------------+--------+ GSV prox calf no                            .29               +--------------+---------+------+-----------+------------+--------+ SSV at [facility]    no                            .14              +--------------+---------+------+-----------+------------+--------+   Summary: Bilateral: - No evidence of deep vein thrombosis seen in the lower extremities, bilaterally, from the common femoral through the popliteal veins. - No evidence of superficial venous thrombosis in the lower extremities, bilaterally. - No evidence of deep venous insufficiency seen bilaterally in the lower extremity. - No evidence of superficial venous reflux seen in the greater saphenous veins bilaterally. - No evidence of superficial venous reflux seen in the short saphenous veins bilaterally.  Left: - There appears to be a Baker's Cyst seen behind the Left Knee area with no flow measuring 3.39 cm x 1.59 cm.  *See table(s) above for measurements and observations. Electronically signed by Cordella Shawl MD on 10/27/2024 at 8:32:40 AM.    Final     Recent Labs: Lab Results  Component Value Date   WBC 5.9 11/03/2024   HGB 14.3 11/03/2024   PLT 388.0 11/03/2024   NA 141 11/03/2024   K 3.9 11/03/2024   CL 104 11/03/2024   CO2 30 11/03/2024   GLUCOSE 93 11/03/2024   BUN 11 11/03/2024   CREATININE 0.77 11/03/2024   BILITOT 0.6 09/16/2024   ALKPHOS 75 09/16/2024   AST 17 09/16/2024   ALT 12 09/16/2024   PROT 6.5 09/16/2024   ALBUMIN 3.9 09/16/2024   CALCIUM  9.3 11/03/2024   GFRAA 89 07/01/2020   QFTBGOLDPLUS NEGATIVE 05/12/2024    Speciality Comments: No specialty comments available.  Procedures:  No procedures performed Allergies: Statins, Zocor [simvastatin], Zetia  [ezetimibe ], Bactrim [sulfamethoxazole -trimethoprim], and Zoloft  [sertraline ]   Assessment / Plan:     Visit Diagnoses: Polyarthralgia -  09/16/24: ESR WNL, CRP WNL,dsDNA-, RF-,CH50>60, CCP-  Chronic venous insufficiency  Acute diastolic congestive heart failure (HCC)  PATENT FORAMEN OVALE  Recurrent  sinusitis  Acquired scoliosis  Osteopenia of multiple sites  Postural kyphosis, unspecified spinal region  Cervical radiculopathy  Post-herpetic trigeminal neuralgia  Intradural arachnoid cyst of spine  History of hypothyroidism  Angiomyolipoma of right kidney  Vitamin D  deficiency  Mixed hyperlipidemia  History of pulmonary embolus (PE)  History of DVT of lower extremity  History of colon polyps  B12 deficiency  Orders: No orders of the defined types were placed in this encounter.  No orders of the defined types were placed in this encounter.   Face-to-face time spent with patient was *** minutes. Greater than 50% of time was spent in counseling and coordination of care.  Follow-Up Instructions: No follow-ups on file.   Waddell CHRISTELLA Craze, PA-C  Note - This record has been created using Dragon software.  Chart creation errors have been sought, but may not always  have been located. Such creation errors do not reflect on  the standard of medical care.     [1]  Social History Tobacco Use   Smoking status: Never   Smokeless tobacco: Never  Vaping Use   Vaping status: Never Used  Substance Use Topics   Alcohol use: No   Drug use: No   "

## 2024-11-13 ENCOUNTER — Telehealth: Payer: Self-pay

## 2024-11-13 NOTE — Telephone Encounter (Signed)
 Received clinical note from patient's home health care agency that Entyvio was given on 11/08/23 & tolerated well - BP elevated during infusion (150/72) during infusion, but returned to baseline at end of infusion (138/72).

## 2024-11-14 ENCOUNTER — Ambulatory Visit: Attending: Cardiology | Admitting: Cardiology

## 2024-11-14 ENCOUNTER — Encounter: Payer: Self-pay | Admitting: Cardiology

## 2024-11-14 VITALS — BP 150/70 | HR 68 | Ht 64.5 in | Wt 183.6 lb

## 2024-11-14 DIAGNOSIS — E782 Mixed hyperlipidemia: Secondary | ICD-10-CM

## 2024-11-14 DIAGNOSIS — R600 Localized edema: Secondary | ICD-10-CM | POA: Diagnosis not present

## 2024-11-14 DIAGNOSIS — R7989 Other specified abnormal findings of blood chemistry: Secondary | ICD-10-CM | POA: Diagnosis not present

## 2024-11-14 MED ORDER — POTASSIUM CHLORIDE CRYS ER 20 MEQ PO TBCR: 20.0000 meq | EXTENDED_RELEASE_TABLET | ORAL | Status: AC | PRN

## 2024-11-14 MED ORDER — FUROSEMIDE 40 MG PO TABS: 40.0000 mg | ORAL_TABLET | ORAL | Status: AC | PRN

## 2024-11-14 NOTE — Patient Instructions (Signed)
 Medication Instructions:  Your physician recommends that you continue on your current medications as directed. Please refer to the Current Medication list given to you today.   *If you need a refill on your cardiac medications before your next appointment, please call your pharmacy*  Lab Work: No labs ordered today  If you have labs (blood work) drawn today and your tests are completely normal, you will receive your results only by: MyChart Message (if you have MyChart) OR A paper copy in the mail If you have any lab test that is abnormal or we need to change your treatment, we will call you to review the results.  Testing/Procedures: No test ordered today   Follow-Up: At [facility], you and your health needs are our priority.  As part of our continuing mission to provide you with exceptional heart care, our providers are all part of one team.  This team includes your primary Cardiologist (physician) and Advanced Practice Providers or APPs (Physician Assistants and Nurse Practitioners) who all work together to provide you with the care you need, when you need it.  Your next appointment:   1 year(s)  Provider:   You may see Dr. Darliss or one of the following Advanced Practice Providers on your designated Care Team:   Lonni Meager, NP Lesley Maffucci, PA-C Bernardino Bring, PA-C Cadence Le Roy, PA-C Tylene Lunch, NP Barnie Hila, NP    We recommend signing up for the patient portal called MyChart.  Sign up information is provided on this After Visit Summary.  MyChart is used to connect with patients for Virtual Visits (Telemedicine).  Patients are able to view lab/test results, encounter notes, upcoming appointments, etc.  Non-urgent messages can be sent to your provider as well.   To learn more about what you can do with MyChart, go to [email or web address].

## 2024-11-14 NOTE — Progress Notes (Signed)
 " Cardiology Office Note:    Date:  11/14/2024   ID:  Nicole Daniels, DOB [birth year], MRN [id number]  PCP:  Corwin Antu, FNP    HeartCare Providers Cardiologist:  None     Referring MD: Corwin Antu, FNP   Chief Complaint  Patient presents with   Follow-up    6 week follow up visit. Patient states that she been having a little tightness in her chest. Patient states that it feels like she is having a little swelling in her throat. Patient questions if the symptoms are related to her recent infusion of Entyvio. Meds reviewed.     History of Present Illness:    Nicole Daniels is a 74 y.o. female with a hx of hyperlipidemia, ulcerative colitis, presenting for follow-up.   Patient was last seen due to leg edema.  Started on Lasix  with good effect after uptitrating to 40 mg daily.  Edema completely resolved.  She stopped taking Lasix  for about 2 weeks with no return of edema.  However, she noticed some swelling yesterday but states eating salty foods 2 days prior.  Otherwise doing okay, feels well, denies any concerns at this time.  Prior notes/testing Echocardiogram 05/2024 EF 55 to 60%, impaired relaxation.  Past Medical History:  Diagnosis Date   Abdominal pain, right lower quadrant    Acquired cyst of kidney 01/08/2012   pt denies history of    Acute sinusitis, unspecified    Adenomatous colon polyp    Allergy    Angiomyolipoma    kidney   Anxiety    OCCASIONAL   Cataract    Chest pain    Chest pain, unspecified    Depressive disorder, not elsewhere classified 01/08/2012   not currently    Diverticulosis    Dizziness and giddiness    DVT (deep venous thrombosis) (HCC)    Dysfunction of eustachian tube    H/O Spinal surgery 02/20/2017   Hemorrhoids    History of diverticulitis of colon    History of fusion of cervical spine 04/25/2018   IBS (irritable bowel syndrome)    Liver lesion 2011   Neoplasm of skin of face    malignant   OA  (osteoarthritis)    knee, NECK   Obesity, unspecified    Ostium secundum type atrial septal defect    Other and unspecified hyperlipidemia    Other malaise and fatigue    Palpitations    Pulmonary embolism (HCC)    Shortness of breath    Thyroid  disease    Ulcerative colitis (HCC)    Unspecified essential hypertension    pt states she does not have HTN   Unspecified sinusitis (chronic)    Unspecified vitamin D  deficiency     Past Surgical History:  Procedure Laterality Date   ABDOMINAL HYSTERECTOMY     BREAST REDUCTION SURGERY  1990   CERVICAL DISC SURGERY     Dr.Nudleman   CHOLECYSTECTOMY     COLONOSCOPY     DILATION AND CURETTAGE OF UTERUS     EYE SURGERY Bilateral 07/30/2017   cataract sx   FLEXIBLE SIGMOIDOSCOPY  01/08/2012   Procedure: FLEXIBLE SIGMOIDOSCOPY;  Surgeon: Princella CHRISTELLA Nida, MD;  Location: [facility];  Service: Endoscopy;  Laterality: N/A;   HEMORRHOID SURGERY     KNEE ARTHROTOMY     pt states no    LAMINECTOMY N/A 01/31/2017   Procedure: Thoracic Laminectomy and marsupialization of arachnoid cyst;  Surgeon: Lamar Peaches, MD;  Location: [facility] OR;  Service: Neurosurgery;  Laterality: N/A;   LEFT OOPHORECTOMY  1976   NECK SURGERY N/A 03/2018   PARTIAL HYSTERECTOMY     right ovary remains   REDUCTION MAMMAPLASTY     TONSILLECTOMY  1972   UPPER GASTROINTESTINAL ENDOSCOPY      Current Medications: Current Meds  Medication Sig   acetaminophen  (TYLENOL ) 325 MG tablet Take 650 mg by mouth every 6 (six) hours as needed.   chlorpheniramine  (ALLERGY RELIEF) 4 MG tablet Take 1 tablet (4 mg total) by mouth 2 (two) times daily as needed for allergies.   Cholecalciferol  (VITAMIN D3) 50 MCG (2000 UT) capsule Take 2,000 Units by mouth every other day.   cyanocobalamin  (VITAMIN B12) 500 MCG tablet Take 500 mcg by mouth daily.   diphenhydrAMINE  (DIPHENHIST) 25 mg capsule Take 25 mg by mouth every 6 (six) hours as needed (Prior to infusion).   ENTYVIO 300 MG injection  Inject 300 mg into the vein every 6 (six) weeks.   fluticasone  (FLONASE ) 50 MCG/ACT nasal spray Place 1 spray into both nostrils as needed for allergies.    hydrocortisone  (ANUSOL -HC) 2.5 % rectal cream Place 1 Application rectally 2 (two) times daily.   levothyroxine  (SYNTHROID ) 75 MCG tablet TAKE 1 TABLET EVERY DAY   [DISCONTINUED] potassium chloride  SA (KLOR-CON  M) 20 MEQ tablet Take 1 tablet (20 mEq total) by mouth daily.     Allergies:   Statins, Zocor [simvastatin], Zetia  [ezetimibe ], Bactrim [sulfamethoxazole -trimethoprim], and Zoloft  [sertraline ]   Social History   Socioeconomic History   Marital status: Widowed    Spouse name: Not on file   Number of children: 2   Years of education: Not on file   Highest education level: 12th grade  Occupational History   Occupation: retired  Tobacco Use   Smoking status: Never   Smokeless tobacco: Never  Vaping Use   Vaping status: Never Used  Substance and Sexual Activity   Alcohol use: No   Drug use: No   Sexual activity: Yes    Partners: Male  Other Topics Concern   Not on file  Social History Narrative   Daily caffeine    Social Drivers of Health   Tobacco Use: Low Risk (11/14/2024)   Patient History    Smoking Tobacco Use: Never    Smokeless Tobacco Use: Never    Passive Exposure: Not on file  Financial Resource Strain: Low Risk (08/27/2024)   Overall Financial Resource Strain (CARDIA)    Difficulty of Paying Living Expenses: Not hard at all  Food Insecurity: No Food Insecurity (08/27/2024)   Epic    Worried About Radiation Protection Practitioner of Food in the Last Year: Never true    Ran Out of Food in the Last Year: Never true  Transportation Needs: No Transportation Needs (08/27/2024)   Epic    Lack of Transportation (Medical): No    Lack of Transportation (Non-Medical): No  Physical Activity: Insufficiently Active (08/27/2024)   Exercise Vital Sign    Days of Exercise per Week: 4 days    Minutes of Exercise per Session: 20 min   Stress: No Stress Concern Present (08/27/2024)   [employer] of Occupational Health - Occupational Stress Questionnaire    Feeling of Stress: Not at all  Recent Concern: Stress - Stress Concern Present (08/10/2024)   [employer] of Occupational Health - Occupational Stress Questionnaire    Feeling of Stress: To some extent  Social Connections: Moderately Integrated (08/27/2024)   Social Connection and Isolation Panel    Frequency  of Communication with Friends and Family: More than three times a week    Frequency of Social Gatherings with Friends and Family: More than three times a week    Attends Religious Services: More than 4 times per year    Active Member of [employer] or Organizations: Yes    Attends Banker Meetings: More than 4 times per year    Marital Status: Widowed  Depression (PHQ2-9): Low Risk (08/27/2024)   Depression (PHQ2-9)    PHQ-2 Score: 0  Recent Concern: Depression (PHQ2-9) - Medium Risk (08/11/2024)   Depression (PHQ2-9)    PHQ-2 Score: 8  Alcohol Screen: Low Risk (08/27/2024)   Alcohol Screen    Last Alcohol Screening Score (AUDIT): 0  Housing: Low Risk (08/27/2024)   Epic    Unable to Pay for Housing in the Last Year: No    Number of Times Moved in the Last Year: 0    Homeless in the Last Year: No  Utilities: Not At Risk (08/27/2024)   Epic    Threatened with loss of utilities: No  Health Literacy: Adequate Health Literacy (08/27/2024)   B1300 Health Literacy    Frequency of need for help with medical instructions: Never     Family History: The patient's family history includes Breast cancer (age of onset: 65 - 85) in her cousin; Cirrhosis in her father; Diabetes in her cousin, mother, sister, and another family member; Heart disease in her brother, father, and sister; Heart failure in her mother; Hypertension in her brother, mother, and sister; Other in her son; Stroke in an other family member. There is no history of Colon cancer,  Esophageal cancer, Stomach cancer, Rectal cancer, or Colon polyps.  ROS:   Please see the history of present illness.     All other systems reviewed and are negative.  EKGs/Labs/Other Studies Reviewed:    The following studies were reviewed today:       Recent Labs: 12/24/2023: B Natriuretic Peptide 66.8 09/11/2024: Pro B Natriuretic peptide (BNP) 140.0; TSH 2.01 09/16/2024: ALT 12 09/29/2024: Magnesium  1.9 11/03/2024: BUN 11; Creatinine, Ser 0.77; Hemoglobin 14.3; Platelets 388.0; Potassium 3.9; Sodium 141  Recent Lipid Panel    Component Value Date/Time   CHOL 196 10/11/2023 1134   TRIG 256.0 (H) 10/11/2023 1134   HDL 46.50 10/11/2023 1134   CHOLHDL 4 10/11/2023 1134   VLDL 51.2 (H) 10/11/2023 1134   LDLCALC 98 10/11/2023 1134   LDLDIRECT 174.0 04/07/2021 0839     Risk Assessment/Calculations:            Physical Exam:    VS:  BP (!) 150/70 (BP Location: Left Arm, Patient Position: Sitting)   Pulse 68   Ht 5' 4.5 (1.638 m)   Wt 183 lb 9.6 oz (83.3 kg)   SpO2 93%   BMI 31.03 kg/m     Wt Readings from Last 3 Encounters:  11/14/24 183 lb 9.6 oz (83.3 kg)  11/03/24 180 lb 8 oz (81.9 kg)  10/20/24 183 lb 12.8 oz (83.4 kg)     GEN:  Well nourished, well developed in no acute distress HEENT: Normal NECK: No JVD; No carotid bruits CARDIAC: RRR, no murmurs, rubs, gallops RESPIRATORY:  Clear to auscultation without rales, wheezing or rhonchi  ABDOMEN: Soft, non-tender, abdominal dressing/gtube noted MUSCULOSKELETAL:  no edema; No deformity  SKIN: Warm and dry NEUROLOGIC:  Alert and oriented x 3 PSYCHIATRIC:  Normal affect   ASSESSMENT:    1. Bilateral leg edema   2.  Mixed hyperlipidemia   3. Elevated brain natriuretic peptide (BNP) level    PLAN:    In order of problems listed above:  Leg edema, resolved with starting Lasix .  Denies chest pain or shortness of breath.  Continue Lasix  40 mg daily as needed,  KCl as needed when taking Lasix .  Low-salt diet  advised.   Hyperlipidemia, intolerant to statins.  Continue Zetia  10 mg daily.  Follow-up in yearly or as needed.     Medication Adjustments/Labs and Tests Ordered: Current medicines are reviewed at length with the patient today.  Concerns regarding medicines are outlined above.  No orders of the defined types were placed in this encounter.  Meds ordered this encounter  Medications   furosemide  (LASIX ) 40 MG tablet    Sig: Take 1 tablet (40 mg total) by mouth as needed.    DX Code Needed  .   potassium chloride  SA (KLOR-CON  M) 20 MEQ tablet    Sig: Take 1 tablet (20 mEq total) by mouth as needed.    Patient Instructions  Medication Instructions:  Your physician recommends that you continue on your current medications as directed. Please refer to the Current Medication list given to you today.   *If you need a refill on your cardiac medications before your next appointment, please call your pharmacy*  Lab Work: No labs ordered today  If you have labs (blood work) drawn today and your tests are completely normal, you will receive your results only by: MyChart Message (if you have MyChart) OR A paper copy in the mail If you have any lab test that is abnormal or we need to change your treatment, we will call you to review the results.  Testing/Procedures: No test ordered today   Follow-Up: At [facility], you and your health needs are our priority.  As part of our continuing mission to provide you with exceptional heart care, our providers are all part of one team.  This team includes your primary Cardiologist (physician) and Advanced Practice Providers or APPs (Physician Assistants and Nurse Practitioners) who all work together to provide you with the care you need, when you need it.  Your next appointment:   1 year(s)  Provider:   You may see Dr Darliss or one of the following Advanced Practice Providers on your designated Care Team:   Lonni Meager,  NP Lesley Maffucci, PA-C Bernardino Bring, PA-C Cadence [place], PA-C Tylene Lunch, NP Barnie Hila, NP    We recommend signing up for the patient portal called MyChart.  Sign up information is provided on this After Visit Summary.  MyChart is used to connect with patients for Virtual Visits (Telemedicine).  Patients are able to view lab/test results, encounter notes, upcoming appointments, etc.  Non-urgent messages can be sent to your provider as well.   To learn more about what you can do with MyChart, go to [email or web address].               Signed, Redell Darliss, MD  11/14/2024 10:25 AM    [facility] "

## 2024-11-20 ENCOUNTER — Ambulatory Visit: Admitting: Physician Assistant

## 2024-11-20 DIAGNOSIS — B0222 Postherpetic trigeminal neuralgia: Secondary | ICD-10-CM

## 2024-11-20 DIAGNOSIS — Z86711 Personal history of pulmonary embolism: Secondary | ICD-10-CM

## 2024-11-20 DIAGNOSIS — D1771 Benign lipomatous neoplasm of kidney: Secondary | ICD-10-CM

## 2024-11-20 DIAGNOSIS — E559 Vitamin D deficiency, unspecified: Secondary | ICD-10-CM

## 2024-11-20 DIAGNOSIS — I872 Venous insufficiency (chronic) (peripheral): Secondary | ICD-10-CM

## 2024-11-20 DIAGNOSIS — M2550 Pain in unspecified joint: Secondary | ICD-10-CM

## 2024-11-20 DIAGNOSIS — Z8639 Personal history of other endocrine, nutritional and metabolic disease: Secondary | ICD-10-CM

## 2024-11-20 DIAGNOSIS — E782 Mixed hyperlipidemia: Secondary | ICD-10-CM

## 2024-11-20 DIAGNOSIS — M8589 Other specified disorders of bone density and structure, multiple sites: Secondary | ICD-10-CM

## 2024-11-20 DIAGNOSIS — Q2111 Secundum atrial septal defect: Secondary | ICD-10-CM

## 2024-11-20 DIAGNOSIS — G96198 Other disorders of meninges, not elsewhere classified: Secondary | ICD-10-CM

## 2024-11-20 DIAGNOSIS — J329 Chronic sinusitis, unspecified: Secondary | ICD-10-CM

## 2024-11-20 DIAGNOSIS — E538 Deficiency of other specified B group vitamins: Secondary | ICD-10-CM

## 2024-11-20 DIAGNOSIS — Z86718 Personal history of other venous thrombosis and embolism: Secondary | ICD-10-CM

## 2024-11-20 DIAGNOSIS — M419 Scoliosis, unspecified: Secondary | ICD-10-CM

## 2024-11-20 DIAGNOSIS — Z860100 Personal history of colon polyps, unspecified: Secondary | ICD-10-CM

## 2024-11-20 DIAGNOSIS — I5031 Acute diastolic (congestive) heart failure: Secondary | ICD-10-CM

## 2024-11-20 DIAGNOSIS — M5412 Radiculopathy, cervical region: Secondary | ICD-10-CM

## 2024-11-20 DIAGNOSIS — M4000 Postural kyphosis, site unspecified: Secondary | ICD-10-CM

## 2024-12-19 ENCOUNTER — Ambulatory Visit: Admitting: Family

## 2025-01-02 ENCOUNTER — Ambulatory Visit: Admitting: Internal Medicine

## 2025-02-09 ENCOUNTER — Encounter: Admitting: Family

## 2025-09-01 ENCOUNTER — Ambulatory Visit
